# Patient Record
Sex: Female | Born: 1985
Health system: Southern US, Community
[De-identification: ages and names within clinical notes are randomized; demographics above are authoritative.]

## PROBLEM LIST (undated history)

## (undated) ENCOUNTER — Inpatient Hospital Stay (HOSPITAL_COMMUNITY): Payer: Self-pay

## (undated) DIAGNOSIS — O24419 Gestational diabetes mellitus in pregnancy, unspecified control: Secondary | ICD-10-CM

## (undated) DIAGNOSIS — F1910 Other psychoactive substance abuse, uncomplicated: Secondary | ICD-10-CM

## (undated) DIAGNOSIS — E119 Type 2 diabetes mellitus without complications: Secondary | ICD-10-CM

## (undated) DIAGNOSIS — E669 Obesity, unspecified: Secondary | ICD-10-CM

## (undated) DIAGNOSIS — S83512A Sprain of anterior cruciate ligament of left knee, initial encounter: Secondary | ICD-10-CM

## (undated) DIAGNOSIS — S83249A Other tear of medial meniscus, current injury, unspecified knee, initial encounter: Secondary | ICD-10-CM

## (undated) HISTORY — PX: EXCISION BONE CYST: SHX6616

---
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
## 2007-01-29 ENCOUNTER — Telehealth: Payer: Self-pay | Admitting: *Deleted

## 2007-01-30 ENCOUNTER — Encounter (INDEPENDENT_AMBULATORY_CARE_PROVIDER_SITE_OTHER): Payer: Self-pay | Admitting: Family Medicine

## 2007-01-30 ENCOUNTER — Ambulatory Visit: Payer: Self-pay | Admitting: Family Medicine

## 2007-01-30 LAB — CONVERTED CEMR LAB
Beta hcg, urine, semiquantitative: NEGATIVE
Chlamydia, DNA Probe: NEGATIVE
GC Probe Amp, Genital: NEGATIVE
Whiff Test: NEGATIVE

## 2007-01-31 ENCOUNTER — Encounter (INDEPENDENT_AMBULATORY_CARE_PROVIDER_SITE_OTHER): Payer: Self-pay | Admitting: Family Medicine

## 2007-03-18 ENCOUNTER — Encounter: Payer: Self-pay | Admitting: Family Medicine

## 2007-03-18 ENCOUNTER — Ambulatory Visit: Payer: Self-pay | Admitting: Family Medicine

## 2007-03-18 ENCOUNTER — Emergency Department (HOSPITAL_COMMUNITY): Admission: EM | Admit: 2007-03-18 | Discharge: 2007-03-18 | Payer: Self-pay | Admitting: Emergency Medicine

## 2007-03-18 LAB — CONVERTED CEMR LAB
ALT: 13 units/L (ref 0–35)
AST: 11 units/L (ref 0–37)
Albumin: 3.9 g/dL (ref 3.5–5.2)
Alkaline Phosphatase: 69 units/L (ref 39–117)
Amylase: 33 units/L (ref 0–105)
BUN: 11 mg/dL (ref 6–23)
CO2: 26 meq/L (ref 19–32)
Calcium: 8.9 mg/dL (ref 8.4–10.5)
Chlamydia, DNA Probe: NEGATIVE
Chloride: 104 meq/L (ref 96–112)
Creatinine, Ser: 0.58 mg/dL (ref 0.40–1.20)
GC Probe Amp, Genital: NEGATIVE
Glucose, Bld: 104 mg/dL — ABNORMAL HIGH (ref 70–99)
Lipase: 8 units/L (ref 0–75)
Potassium: 4.2 meq/L (ref 3.5–5.3)
Sodium: 140 meq/L (ref 135–145)
Total Bilirubin: 0.4 mg/dL (ref 0.3–1.2)
Total Protein: 6.7 g/dL (ref 6.0–8.3)

## 2007-03-19 ENCOUNTER — Encounter: Payer: Self-pay | Admitting: Family Medicine

## 2007-03-19 ENCOUNTER — Telehealth: Payer: Self-pay | Admitting: *Deleted

## 2007-03-20 ENCOUNTER — Telehealth: Payer: Self-pay | Admitting: *Deleted

## 2007-03-21 ENCOUNTER — Ambulatory Visit (HOSPITAL_COMMUNITY): Admission: RE | Admit: 2007-03-21 | Discharge: 2007-03-21 | Payer: Self-pay | Admitting: Family Medicine

## 2007-03-31 ENCOUNTER — Encounter: Payer: Self-pay | Admitting: Family Medicine

## 2007-04-15 ENCOUNTER — Ambulatory Visit: Payer: Self-pay | Admitting: Family Medicine

## 2007-04-15 ENCOUNTER — Telehealth (INDEPENDENT_AMBULATORY_CARE_PROVIDER_SITE_OTHER): Payer: Self-pay | Admitting: *Deleted

## 2007-04-15 LAB — CONVERTED CEMR LAB: Rapid Strep: NEGATIVE

## 2007-05-08 ENCOUNTER — Other Ambulatory Visit: Admission: RE | Admit: 2007-05-08 | Discharge: 2007-05-08 | Payer: Self-pay | Admitting: Family Medicine

## 2007-05-08 ENCOUNTER — Telehealth: Payer: Self-pay | Admitting: *Deleted

## 2007-05-08 ENCOUNTER — Encounter (INDEPENDENT_AMBULATORY_CARE_PROVIDER_SITE_OTHER): Payer: Self-pay | Admitting: Family Medicine

## 2007-05-08 ENCOUNTER — Ambulatory Visit: Payer: Self-pay | Admitting: Family Medicine

## 2007-05-08 DIAGNOSIS — F329 Major depressive disorder, single episode, unspecified: Secondary | ICD-10-CM

## 2007-05-08 DIAGNOSIS — F32A Depression, unspecified: Secondary | ICD-10-CM | POA: Insufficient documentation

## 2007-05-08 LAB — CONVERTED CEMR LAB
Beta hcg, urine, semiquantitative: NEGATIVE
Chlamydia, DNA Probe: NEGATIVE
GC Probe Amp, Genital: NEGATIVE
Hgb A1c MFr Bld: 5.8 %
KOH Prep: NEGATIVE
Pap Smear: NORMAL
Whiff Test: NEGATIVE

## 2007-05-09 ENCOUNTER — Encounter: Admission: RE | Admit: 2007-05-09 | Discharge: 2007-05-09 | Payer: Self-pay | Admitting: Sports Medicine

## 2007-05-13 ENCOUNTER — Telehealth (INDEPENDENT_AMBULATORY_CARE_PROVIDER_SITE_OTHER): Payer: Self-pay | Admitting: Family Medicine

## 2007-05-13 ENCOUNTER — Encounter (INDEPENDENT_AMBULATORY_CARE_PROVIDER_SITE_OTHER): Payer: Self-pay | Admitting: Family Medicine

## 2007-08-23 ENCOUNTER — Telehealth: Payer: Self-pay | Admitting: *Deleted

## 2007-08-26 ENCOUNTER — Ambulatory Visit: Payer: Self-pay | Admitting: Sports Medicine

## 2007-08-26 DIAGNOSIS — L2089 Other atopic dermatitis: Secondary | ICD-10-CM

## 2007-10-01 ENCOUNTER — Encounter (INDEPENDENT_AMBULATORY_CARE_PROVIDER_SITE_OTHER): Payer: Self-pay | Admitting: Family Medicine

## 2007-10-01 ENCOUNTER — Ambulatory Visit: Payer: Self-pay | Admitting: Family Medicine

## 2007-10-01 LAB — CONVERTED CEMR LAB
Beta hcg, urine, semiquantitative: NEGATIVE
Chlamydia, DNA Probe: NEGATIVE
GC Probe Amp, Genital: NEGATIVE
Hgb A1c MFr Bld: 6.5 %
Whiff Test: NEGATIVE

## 2008-03-09 LAB — CONVERTED CEMR LAB

## 2008-03-11 ENCOUNTER — Encounter: Payer: Self-pay | Admitting: Family Medicine

## 2008-03-11 ENCOUNTER — Other Ambulatory Visit: Admission: RE | Admit: 2008-03-11 | Discharge: 2008-03-11 | Payer: Self-pay | Admitting: Family Medicine

## 2008-03-11 ENCOUNTER — Ambulatory Visit: Payer: Self-pay | Admitting: Family Medicine

## 2008-03-11 LAB — CONVERTED CEMR LAB
Beta hcg, urine, semiquantitative: NEGATIVE
Chlamydia, DNA Probe: NEGATIVE
GC Probe Amp, Genital: NEGATIVE
Hgb A1c MFr Bld: 6.3 %
Whiff Test: POSITIVE

## 2008-03-18 ENCOUNTER — Encounter: Payer: Self-pay | Admitting: Family Medicine

## 2008-04-14 ENCOUNTER — Ambulatory Visit: Payer: Self-pay | Admitting: Family Medicine

## 2008-05-11 ENCOUNTER — Ambulatory Visit: Payer: Self-pay | Admitting: Family Medicine

## 2008-06-10 ENCOUNTER — Encounter: Payer: Self-pay | Admitting: *Deleted

## 2008-08-14 ENCOUNTER — Encounter: Payer: Self-pay | Admitting: Family Medicine

## 2008-08-14 ENCOUNTER — Telehealth: Payer: Self-pay | Admitting: Psychology

## 2008-08-14 ENCOUNTER — Ambulatory Visit: Payer: Self-pay | Admitting: Family Medicine

## 2008-08-14 LAB — CONVERTED CEMR LAB
Hgb A1c MFr Bld: 6.8 %
Whiff Test: POSITIVE

## 2008-08-16 ENCOUNTER — Telehealth: Payer: Self-pay | Admitting: Family Medicine

## 2008-08-18 ENCOUNTER — Telehealth: Payer: Self-pay | Admitting: *Deleted

## 2008-08-20 LAB — CONVERTED CEMR LAB
Chlamydia, DNA Probe: NEGATIVE
GC Probe Amp, Genital: NEGATIVE

## 2008-09-09 ENCOUNTER — Telehealth: Payer: Self-pay | Admitting: Family Medicine

## 2008-09-16 ENCOUNTER — Ambulatory Visit: Payer: Self-pay | Admitting: Family Medicine

## 2008-09-16 DIAGNOSIS — F39 Unspecified mood [affective] disorder: Secondary | ICD-10-CM | POA: Insufficient documentation

## 2008-09-28 ENCOUNTER — Ambulatory Visit: Payer: Self-pay | Admitting: Family Medicine

## 2008-10-05 ENCOUNTER — Ambulatory Visit: Payer: Self-pay | Admitting: Family Medicine

## 2008-10-14 ENCOUNTER — Telehealth: Payer: Self-pay | Admitting: Psychology

## 2008-11-13 ENCOUNTER — Emergency Department (HOSPITAL_COMMUNITY): Admission: EM | Admit: 2008-11-13 | Discharge: 2008-11-13 | Payer: Self-pay | Admitting: Emergency Medicine

## 2008-11-20 ENCOUNTER — Ambulatory Visit: Payer: Self-pay | Admitting: Family Medicine

## 2008-11-20 LAB — CONVERTED CEMR LAB
Beta hcg, urine, semiquantitative: NEGATIVE
Hgb A1c MFr Bld: 6.7 %

## 2009-01-20 ENCOUNTER — Ambulatory Visit: Payer: Self-pay | Admitting: Family Medicine

## 2009-01-20 LAB — CONVERTED CEMR LAB: Beta hcg, urine, semiquantitative: NEGATIVE

## 2009-01-28 ENCOUNTER — Ambulatory Visit: Payer: Self-pay | Admitting: Family Medicine

## 2009-02-04 ENCOUNTER — Emergency Department (HOSPITAL_COMMUNITY): Admission: EM | Admit: 2009-02-04 | Discharge: 2009-02-04 | Payer: Self-pay | Admitting: Emergency Medicine

## 2009-02-16 ENCOUNTER — Encounter: Payer: Self-pay | Admitting: Family Medicine

## 2009-03-17 ENCOUNTER — Ambulatory Visit: Payer: Self-pay | Admitting: Family Medicine

## 2009-03-17 DIAGNOSIS — L732 Hidradenitis suppurativa: Secondary | ICD-10-CM

## 2009-03-22 ENCOUNTER — Other Ambulatory Visit: Admission: RE | Admit: 2009-03-22 | Discharge: 2009-03-22 | Payer: Self-pay | Admitting: Family Medicine

## 2009-03-22 ENCOUNTER — Ambulatory Visit: Payer: Self-pay | Admitting: Family Medicine

## 2009-03-22 ENCOUNTER — Encounter: Payer: Self-pay | Admitting: Family Medicine

## 2009-03-22 ENCOUNTER — Telehealth: Payer: Self-pay | Admitting: Family Medicine

## 2009-03-23 ENCOUNTER — Encounter: Payer: Self-pay | Admitting: Family Medicine

## 2009-03-23 ENCOUNTER — Encounter: Admission: RE | Admit: 2009-03-23 | Discharge: 2009-03-23 | Payer: Self-pay | Admitting: Family Medicine

## 2009-03-31 ENCOUNTER — Ambulatory Visit: Payer: Self-pay | Admitting: Family Medicine

## 2009-04-06 ENCOUNTER — Ambulatory Visit: Payer: Self-pay | Admitting: Family Medicine

## 2009-04-06 ENCOUNTER — Encounter: Payer: Self-pay | Admitting: Family Medicine

## 2009-04-06 DIAGNOSIS — R8789 Other abnormal findings in specimens from female genital organs: Secondary | ICD-10-CM | POA: Insufficient documentation

## 2009-04-14 ENCOUNTER — Encounter: Payer: Self-pay | Admitting: Family Medicine

## 2009-05-20 ENCOUNTER — Ambulatory Visit: Payer: Self-pay | Admitting: Family Medicine

## 2009-05-20 LAB — CONVERTED CEMR LAB: Beta hcg, urine, semiquantitative: NEGATIVE

## 2009-06-17 ENCOUNTER — Telehealth: Payer: Self-pay | Admitting: Family Medicine

## 2009-10-11 ENCOUNTER — Ambulatory Visit: Payer: Self-pay | Admitting: Family Medicine

## 2009-10-11 LAB — CONVERTED CEMR LAB: Hgb A1c MFr Bld: 8.4 %

## 2009-10-25 ENCOUNTER — Encounter: Payer: Self-pay | Admitting: Family Medicine

## 2009-10-29 ENCOUNTER — Ambulatory Visit: Payer: Self-pay | Admitting: Family Medicine

## 2009-10-29 LAB — CONVERTED CEMR LAB: Beta hcg, urine, semiquantitative: NEGATIVE

## 2009-11-15 ENCOUNTER — Ambulatory Visit: Payer: Self-pay | Admitting: Family Medicine

## 2009-11-15 ENCOUNTER — Encounter: Payer: Self-pay | Admitting: Family Medicine

## 2009-11-15 LAB — CONVERTED CEMR LAB
Beta hcg, urine, semiquantitative: POSITIVE
Bilirubin Urine: NEGATIVE
Blood in Urine, dipstick: NEGATIVE
GC Probe Amp, Genital: NEGATIVE
Glucose, Urine, Semiquant: 100
Nitrite: NEGATIVE
Protein, U semiquant: NEGATIVE
Specific Gravity, Urine: 1.02
Urobilinogen, UA: 0.2
Whiff Test: POSITIVE
pH: 6.5

## 2009-11-16 ENCOUNTER — Ambulatory Visit (HOSPITAL_COMMUNITY): Admission: RE | Admit: 2009-11-16 | Discharge: 2009-11-16 | Payer: Self-pay | Admitting: Family Medicine

## 2009-11-16 ENCOUNTER — Encounter: Payer: Self-pay | Admitting: Family Medicine

## 2009-11-16 ENCOUNTER — Telehealth: Payer: Self-pay | Admitting: Family Medicine

## 2009-11-17 ENCOUNTER — Encounter: Payer: Self-pay | Admitting: Family Medicine

## 2009-11-17 ENCOUNTER — Telehealth: Payer: Self-pay | Admitting: Family Medicine

## 2009-11-17 ENCOUNTER — Telehealth: Payer: Self-pay | Admitting: *Deleted

## 2009-11-18 ENCOUNTER — Encounter: Payer: Self-pay | Admitting: Family Medicine

## 2009-11-23 ENCOUNTER — Telehealth: Payer: Self-pay | Admitting: Family Medicine

## 2009-11-23 ENCOUNTER — Ambulatory Visit: Payer: Self-pay | Admitting: Family Medicine

## 2009-11-23 ENCOUNTER — Encounter: Payer: Self-pay | Admitting: Family Medicine

## 2009-11-23 LAB — CONVERTED CEMR LAB
Eosinophils Absolute: 0.1 10*3/uL (ref 0.0–0.7)
Hepatitis B Surface Ag: NEGATIVE
Lymphocytes Relative: 21 % (ref 12–46)
Lymphs Abs: 1.7 10*3/uL (ref 0.7–4.0)
MCV: 86.7 fL (ref 78.0–100.0)
Neutro Abs: 5.7 10*3/uL (ref 1.7–7.7)
Neutrophils Relative %: 71 % (ref 43–77)
Platelets: 293 10*3/uL (ref 150–400)
Rubella: 1 intl units/mL
Sickle Cell Screen: NEGATIVE
WBC: 8 10*3/uL (ref 4.0–10.5)

## 2009-11-24 ENCOUNTER — Encounter: Payer: Self-pay | Admitting: Family Medicine

## 2009-11-29 ENCOUNTER — Encounter: Payer: Self-pay | Admitting: Family Medicine

## 2009-11-29 ENCOUNTER — Ambulatory Visit (HOSPITAL_COMMUNITY): Admission: RE | Admit: 2009-11-29 | Discharge: 2009-11-29 | Payer: Self-pay | Admitting: Family Medicine

## 2009-11-30 ENCOUNTER — Telehealth: Payer: Self-pay | Admitting: Family Medicine

## 2009-12-01 ENCOUNTER — Ambulatory Visit: Payer: Self-pay | Admitting: Family Medicine

## 2009-12-01 LAB — CONVERTED CEMR LAB: Whiff Test: NEGATIVE

## 2009-12-24 ENCOUNTER — Inpatient Hospital Stay (HOSPITAL_COMMUNITY): Admission: AD | Admit: 2009-12-24 | Discharge: 2009-12-24 | Payer: Self-pay | Admitting: Obstetrics & Gynecology

## 2010-01-13 ENCOUNTER — Encounter: Payer: Self-pay | Admitting: Family Medicine

## 2010-01-19 ENCOUNTER — Ambulatory Visit: Payer: Self-pay | Admitting: Obstetrics and Gynecology

## 2010-01-27 ENCOUNTER — Ambulatory Visit: Payer: Self-pay | Admitting: Obstetrics & Gynecology

## 2010-01-31 ENCOUNTER — Ambulatory Visit: Payer: Self-pay | Admitting: Obstetrics & Gynecology

## 2010-01-31 ENCOUNTER — Encounter: Payer: Self-pay | Admitting: Family Medicine

## 2010-01-31 ENCOUNTER — Encounter: Admission: RE | Admit: 2010-01-31 | Discharge: 2010-01-31 | Payer: Self-pay | Admitting: Obstetrics and Gynecology

## 2010-01-31 LAB — CONVERTED CEMR LAB
ALT: 21 U/L
AST: 15 U/L
Albumin: 3.5 g/dL
Alkaline Phosphatase: 61 U/L
BUN: 8 mg/dL
CO2: 22 meq/L
Calcium: 8.7 mg/dL
Chloride: 102 meq/L
Collection Interval-CRCL: 24 hr
Creatinine 24 HR UR: 1548 mg/24hr (ref 700–1800)
Creatinine Clearance: 244 mL/min — ABNORMAL HIGH (ref 75–115)
Creatinine, Ser: 0.44 mg/dL
Creatinine, Urine: 96.7 mg/dL
Glucose, Bld: 103 mg/dL — ABNORMAL HIGH
HCT: 37.9 %
Hemoglobin: 12.6 g/dL
Hgb A1c MFr Bld: 5.9 % — ABNORMAL HIGH
MCHC: 33.2 g/dL
MCV: 85.7 fL
Platelets: 266 10*3/uL
Potassium: 4 meq/L
RBC: 4.42 M/uL
RDW: 13.8 %
Sodium: 135 meq/L
TSH: 1.248 u[IU]/mL
Total Bilirubin: 0.4 mg/dL
Total Protein: 6.4 g/dL
Uric Acid, Serum: 5.1 mg/dL
WBC: 8.8 10*3/uL

## 2010-02-10 ENCOUNTER — Ambulatory Visit: Payer: Self-pay | Admitting: Family Medicine

## 2010-02-14 ENCOUNTER — Ambulatory Visit (HOSPITAL_COMMUNITY): Admission: RE | Admit: 2010-02-14 | Discharge: 2010-02-14 | Payer: Self-pay | Admitting: Family Medicine

## 2010-02-14 ENCOUNTER — Ambulatory Visit: Payer: Self-pay | Admitting: Obstetrics & Gynecology

## 2010-02-17 ENCOUNTER — Encounter: Payer: Self-pay | Admitting: Family Medicine

## 2010-02-21 ENCOUNTER — Ambulatory Visit: Payer: Self-pay | Admitting: Obstetrics and Gynecology

## 2010-02-28 ENCOUNTER — Ambulatory Visit: Payer: Self-pay | Admitting: Obstetrics & Gynecology

## 2010-03-07 ENCOUNTER — Ambulatory Visit: Payer: Self-pay | Admitting: Obstetrics & Gynecology

## 2010-03-11 ENCOUNTER — Encounter: Payer: Self-pay | Admitting: Family Medicine

## 2010-03-14 ENCOUNTER — Ambulatory Visit: Payer: Self-pay | Admitting: Obstetrics & Gynecology

## 2010-03-21 ENCOUNTER — Ambulatory Visit: Payer: Self-pay | Admitting: Obstetrics & Gynecology

## 2010-04-04 ENCOUNTER — Ambulatory Visit: Payer: Self-pay | Admitting: Obstetrics & Gynecology

## 2010-04-04 ENCOUNTER — Encounter: Admission: RE | Admit: 2010-04-04 | Discharge: 2010-05-06 | Payer: Self-pay | Admitting: Family Medicine

## 2010-04-18 ENCOUNTER — Encounter: Payer: Self-pay | Admitting: Family Medicine

## 2010-04-18 ENCOUNTER — Ambulatory Visit: Payer: Self-pay | Admitting: Obstetrics & Gynecology

## 2010-04-18 LAB — CONVERTED CEMR LAB
MCHC: 32.4 g/dL (ref 30.0–36.0)
MCV: 88.4 fL (ref 78.0–100.0)
Platelets: 281 10*3/uL (ref 150–400)
RDW: 13.6 % (ref 11.5–15.5)

## 2010-04-21 ENCOUNTER — Ambulatory Visit (HOSPITAL_COMMUNITY): Admission: RE | Admit: 2010-04-21 | Discharge: 2010-04-21 | Payer: Self-pay | Admitting: Family Medicine

## 2010-04-25 ENCOUNTER — Ambulatory Visit: Payer: Self-pay | Admitting: Obstetrics & Gynecology

## 2010-05-02 ENCOUNTER — Ambulatory Visit: Payer: Self-pay | Admitting: Obstetrics & Gynecology

## 2010-05-09 ENCOUNTER — Ambulatory Visit (HOSPITAL_COMMUNITY): Admission: RE | Admit: 2010-05-09 | Discharge: 2010-05-09 | Payer: Self-pay | Admitting: Family Medicine

## 2010-05-09 ENCOUNTER — Ambulatory Visit: Payer: Self-pay | Admitting: Obstetrics & Gynecology

## 2010-05-16 ENCOUNTER — Ambulatory Visit: Payer: Self-pay | Admitting: Obstetrics and Gynecology

## 2010-05-23 ENCOUNTER — Encounter
Admission: RE | Admit: 2010-05-23 | Discharge: 2010-06-27 | Payer: Self-pay | Source: Home / Self Care | Admitting: Obstetrics and Gynecology

## 2010-05-23 ENCOUNTER — Ambulatory Visit: Payer: Self-pay | Admitting: Obstetrics & Gynecology

## 2010-05-26 ENCOUNTER — Ambulatory Visit: Payer: Self-pay | Admitting: Obstetrics and Gynecology

## 2010-05-30 ENCOUNTER — Ambulatory Visit: Payer: Self-pay | Admitting: Obstetrics & Gynecology

## 2010-06-02 ENCOUNTER — Ambulatory Visit: Payer: Self-pay | Admitting: Obstetrics and Gynecology

## 2010-06-06 ENCOUNTER — Ambulatory Visit: Payer: Self-pay | Admitting: Family Medicine

## 2010-06-09 ENCOUNTER — Ambulatory Visit (HOSPITAL_COMMUNITY): Admission: RE | Admit: 2010-06-09 | Discharge: 2010-06-09 | Payer: Self-pay | Admitting: Obstetrics & Gynecology

## 2010-06-09 ENCOUNTER — Ambulatory Visit: Payer: Self-pay | Admitting: Obstetrics and Gynecology

## 2010-06-13 ENCOUNTER — Ambulatory Visit: Payer: Self-pay | Admitting: Obstetrics & Gynecology

## 2010-06-16 ENCOUNTER — Ambulatory Visit: Payer: Self-pay | Admitting: Obstetrics and Gynecology

## 2010-06-17 ENCOUNTER — Encounter: Payer: Self-pay | Admitting: Family Medicine

## 2010-06-20 ENCOUNTER — Ambulatory Visit: Payer: Self-pay | Admitting: Obstetrics and Gynecology

## 2010-06-20 ENCOUNTER — Encounter: Payer: Self-pay | Admitting: Family Medicine

## 2010-06-21 ENCOUNTER — Encounter: Payer: Self-pay | Admitting: Family Medicine

## 2010-06-23 ENCOUNTER — Ambulatory Visit (HOSPITAL_COMMUNITY): Admission: RE | Admit: 2010-06-23 | Discharge: 2010-06-23 | Payer: Self-pay | Admitting: Family Medicine

## 2010-06-23 ENCOUNTER — Ambulatory Visit: Payer: Self-pay | Admitting: Obstetrics & Gynecology

## 2010-06-25 ENCOUNTER — Inpatient Hospital Stay (HOSPITAL_COMMUNITY): Admission: AD | Admit: 2010-06-25 | Discharge: 2010-06-25 | Payer: Self-pay | Admitting: Obstetrics & Gynecology

## 2010-06-27 ENCOUNTER — Ambulatory Visit: Payer: Self-pay | Admitting: Obstetrics and Gynecology

## 2010-06-29 ENCOUNTER — Ambulatory Visit: Payer: Self-pay | Admitting: Obstetrics and Gynecology

## 2010-07-03 ENCOUNTER — Inpatient Hospital Stay (HOSPITAL_COMMUNITY)
Admission: AD | Admit: 2010-07-03 | Discharge: 2010-07-03 | Payer: Self-pay | Source: Home / Self Care | Admitting: Obstetrics & Gynecology

## 2010-07-04 ENCOUNTER — Ambulatory Visit: Payer: Self-pay | Admitting: Obstetrics & Gynecology

## 2010-07-04 ENCOUNTER — Ambulatory Visit (HOSPITAL_COMMUNITY)
Admission: RE | Admit: 2010-07-04 | Discharge: 2010-07-04 | Payer: Self-pay | Source: Home / Self Care | Admitting: Family Medicine

## 2010-07-07 ENCOUNTER — Ambulatory Visit: Payer: Self-pay | Admitting: Family Medicine

## 2010-07-10 ENCOUNTER — Inpatient Hospital Stay (HOSPITAL_COMMUNITY)
Admission: AD | Admit: 2010-07-10 | Discharge: 2010-07-13 | Payer: Self-pay | Source: Home / Self Care | Attending: Obstetrics & Gynecology | Admitting: Obstetrics & Gynecology

## 2010-08-08 ENCOUNTER — Ambulatory Visit: Payer: Self-pay | Admitting: Family Medicine

## 2010-08-17 ENCOUNTER — Ambulatory Visit: Admission: RE | Admit: 2010-08-17 | Discharge: 2010-08-17 | Payer: Self-pay | Source: Home / Self Care

## 2010-08-17 LAB — CONVERTED CEMR LAB: Hgb A1c MFr Bld: 5.5 %

## 2010-09-06 NOTE — Letter (Signed)
Summary: Generic Letter  [facility]  [address]   [place], [place] [address]   Phone: [phone]  Fax: [phone]    11/16/2009  [facility] SCHEFFER [address] [place], [place]  [address]  Dear [name],  I just wanted to talk with you about your ultrasound results.  I tried to call you, but was unable to reach you by phone.  Your ultrasound showed that you are a little more than [redacted] weeks pregnant.  They were not able to actually see the embryo (baby), but that is normal at this early stage of the pregnancy.  Be sure to keep your appointment with the [facility].  Please call me if you have any questions or concerns.             Sincerely,   [name] MD  Appended Document: Generic Letter mailed.

## 2010-09-06 NOTE — Progress Notes (Signed)
Summary: attempt to notify about ultrasound results  Phone Note Outgoing Call   Call placed by: [name] MD,  November 16, 2009 1:41 PM Summary of Call: attempted to call pt (no answer/no vm)  to discuss ultrasound results.  no fetal pole seen, but normal given the early stage of pregnancy.  Will send letter.   Initial call taken by: [name] MD,  November 16, 2009 1:42 PM

## 2010-09-06 NOTE — Miscellaneous (Signed)
Summary: Re: High Risk clinic appointment  Clinical Lists Changes   called patient to advise her of appointment that has been scheduled at [facility] for 11/24/2009 at 8:45 AM. patient states she is very confused because she was called yesterday and was told that we will be seeing her here instead. will send message to Dr. Rexene Alberts ( has appointment with him 11/23/2009 )  and also Dr. Lafonda Mosses since she put in the referral  to please advise. Theresia Lo RN  November 18, 2009 8:59 AM  Unless Aurther Loft has an objection, I would like her to go straight to high risk.  I wonder if she misunderstood the conversations yesterday--I think they were all about medicines.  Dr.Hubbert Landrigan please advise about appointment with you on 11/23/2009.  Theresia Lo RN  November 18, 2009 9:21 AM  discussed with Dr. Lafonda Mosses. [facility] prefers that we at least get the patient started and plan on referring to them if/when needed. Please call pt to clarify and inform that indeed she should come here for initial OB appointment with me. Sorry for the confusion. Myrtie Soman  MD  November 18, 2009 10:42 AM    the appointment that has been scheduled with [facility] is 11/24/2009. should I cancel this or wait until she comes in on 11/23/2009 to decide. Theresia Lo RN  November 18, 2009 11:03 AM  appointment has been cancel.Tonia Brooms is to keep her appointment with me. Myrtie Soman  MD  November 18, 2009 9:37 PM

## 2010-09-06 NOTE — Letter (Signed)
Summary: Generic Letter  [facility]  [address]   [place], [place] [address]   Phone: [phone]  Fax: [phone]    11/17/2009  [facility] SCHEFFER [address] [place], [place]  [address]  Dear [name],    It looks like you have a urinary tract infection.  You need to be on antibiotics.  Please call the office when you get this letter so that we can get you a prescription.  I tried to reach you by phone, but was not able to get in touch with you.       Sincerely,   [name] MD  Appended Document: Generic Letter mailed

## 2010-09-06 NOTE — Miscellaneous (Signed)
  Clinical Lists Changes  Problems: Removed problem of AMENORRHEA (ICD-626.0) Removed problem of ACROCHORDON (ICD-701.9) Removed problem of ELEVATED BLOOD PRESSURE WITHOUT DIAGNOSIS OF HYPERTENSION (ICD-796.2) Removed problem of Question of  POLYCYSTIC OVARIAN DISEASE (ICD-256.4)

## 2010-09-06 NOTE — Assessment & Plan Note (Signed)
Summary: pt summary     Impression & Recommendations:  Problem # 1:  DIABETES MELLITUS II, UNCOMPLICATED (ICD-250.00) Non-adherent at times. A1c reflects med adherence. Recently restarted on metformin. Now seen at [facility] for her [facility].   Her updated medication list for this problem includes:    Metformin Hcl 1000 Mg Tabs (Metformin hcl) ..... One by mouth two times a day  Labs Reviewed: Creat: 0.58 (03/18/2007)     Last Eye Exam: normal (10/19/2009) Reviewed HgBA1c results: 8.4 (10/11/2009)  6.7 (11/20/2008)  Problem # 2:  OBESITY, NOS (ICD-278.00) very poor diet but recently has made some strides. Lost  ~ 20 lbs since 8/10 before recently becoming pregnant.  Problem # 3:  TOBACCO DEPENDENCE (ICD-305.1) low volume smoker. Has had trouble quitting.  Problem # 4:  ? of POLYCYSTIC OVARIAN DISEASE (ICD-256.4) history of cysts on [place] 2008, DUB, obesity, menstrual irregularities. On metformin.   Complete Medication List: 1)  Metformin Hcl 1000 Mg Tabs (Metformin hcl) .... One by mouth two times a day 2)  Blood Glucose Monitor Kit (Blood glucose monitoring suppl) .... Check sugars once a day 3)  Blood Glucose Test Strp (Glucose blood) .... Check sugars once a day 4)  Lancets Misc (Lancets) .... For checking sugars once a day 5)  Prenatal Vitamins 0.8 Mg Tabs (Prenatal multivit-min-fe-fa) .Marland Kitchen.. 1 tab by mouth daily; dispense generic   Past History:  Past Medical History:  childhood Varicella, f. glu= 130 TSH= 1.347  3/06, FT NSVD, no prenatal care, Ht=64.5 inches, menarche 25 yo  HgbA1C 6.5 2/09; 6.8 1/10  likely PCOS: had small cysts on [place] 2008, DUB, obesity, glucose intolerance

## 2010-09-06 NOTE — Progress Notes (Signed)
Summary: phn msg  Phone Note Call from Patient Call back at Home Phone [phone]   Caller: Patient Summary of Call: read letter to pt about UTI and pt is wondering if she should continue meds for her bacterial inf??  pls call before 4pm [facility] Initial call taken by: De Nurse,  November 17, 2009 2:16 PM  Follow-up for Phone Call        uses the heath department. told her to take the meds. changed to the health dept per pt request. states her boyfriend proposes last Saturday & found out she was pregnant monday. has applied for medicaid Follow-up by: Golden Circle RN,  November 17, 2009 2:21 PM    Prescriptions: CEPHALEXIN 500 MG CAPS (CEPHALEXIN) 1 tab by mouth two times a day for 7 days for urinary tract infection  #14 x 0   Entered by:   Golden Circle RN   Authorized by:   Marland Kitchen Triage [facility]   Signed by:   Golden Circle RN on 11/17/2009   Method used:   Printed then faxed to ...       [facility] Department (retail)       [address] [place], [place]  [address]       Ph: [phone]       Fax: [phone]   RxID:   [id number]

## 2010-09-06 NOTE — Progress Notes (Signed)
Summary: triage  Phone Note Call from Patient Call back at Home Phone [phone]   Caller: Patient Summary of Call: has a yeast inf and wants to know what do for it since she is [redacted] weeks pregnant Initial call taken by: De Nurse,  November 30, 2009 10:42 AM  Follow-up for Phone Call        advised seeing md. she has no ride today. took 8:30am workin tomorrow Follow-up by: Golden Circle RN,  November 30, 2009 10:48 AM

## 2010-09-06 NOTE — Assessment & Plan Note (Signed)
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  History of Present Illness: 1. 25 yo with T2DM who found out she was pregnant 4/11 here for NOB visit. Had U/S done 4/12 which showed that she was 5 w 2 d EGA. Could not see fetal pole due to early dates, needs to go back  ~ 2 weeks after initial [place].  States that sugars have been very well controlled since she was found to be pregnant.   2. Diabetes taking medications: yes problems with medications?: no blood sugar testing frequency: two times a day  hypoglycemic events?: no subjective: doing much better with sugars.   ROS chest pain: no   shortness of breath: no   polyuria:   yes  increased thirst: yes    problems with Breast tenderness: yes       more emotional: yes  Habits & Providers  Alcohol-Tobacco-Diet     Tobacco Status: current     Tobacco Counseling: to quit use of tobacco products     Cigarette Packs/Day: <0.25  Current Medications (verified): 1)  Metformin Hcl 1000 Mg Tabs (Metformin Hcl) .... One By Mouth Two Times A Day 2)  Blood Glucose Monitor  Kit (Blood Glucose Monitoring Suppl) .... Check Sugars Once A Day 3)  Blood Glucose Test  Strp (Glucose Blood) .... Check Sugars Once A Day 4)  Lancets  Misc (Lancets) .... For Checking Sugars Once A Day 5)  Prenatal Vitamins 0.8 Mg Tabs (Prenatal Multivit-Min-Fe-Fa)  .Marland Kitchen.. 1 Tab By Mouth Daily; Dispense Generic 6)  Cephalexin 500 Mg Caps (Cephalexin) .Marland Kitchen.. 1 Tab By Mouth Two Times A Day For 7 Days For Urinary Tract Infection  Allergies (verified): No Known Drug Allergies  Family History: Reviewed history from 03/17/2009 and no changes required. migraine ha- aunt and uncle, remote h/o breast ca, HTN, DM, CAD in gparents, uncle.  Grandfather d. colon ca. mom- HTN  aunt-depression Bipoloar - uncle and cousin [place] aunt with breast cancer - 28 recently diagnosed with breast cancer  Social History: Smoked 1 pack per day since 2006 - stopped 2/10 -- now smoking again.  Occ. EtoH usually less than once a month. No other illicit drugs.  High risk sexual behavior. Close to mom. Involved with BF Miguel.  Works at [employer] but would like to go to school to do something different, will start soon.Education:  10th Hepatitis Risk:  no  Review of Systems       review of systems as noted in HPI section   Physical Exam  General:  vitals signs reviewed -- mildly hypertensive but otherwise normal  Lungs:  work of breathing unlabored, clear to auscultation bilaterally; no wheezes, rales, or ronchi; good air movement throughout  Heart:  regular rate and rhythm, no murmurs; normal s1/s2  Abdomen:  +BS, obese, soft, non-tender, non-distended; no masses; no rebound or guarding  Genitalia:  deferred (recent exam 4/11) Extremities:  trace BLE edema Skin:  Intact without suspicious lesions or rashes Psych:  Cognition and judgment appear intact. Alert and cooperative with normal attention span and concentration. No apparent delusions, illusions, hallucinations   Impression & Recommendations:  Problem # 1:  PREGNANCY, HIGH RISK (ICD-V23.9) Assessment Unchanged 25 yo G1P0000 with type 2 DM on metformin at 6w 2d per 5 week U/S. Doing well currently. Counsled to stop smoking (pt states she will) and to continue metformin. Counseled about morning sickness, diet and exercise  as well. Gave pt a list of meds safe to take in pregnancy. Continue PNV. Will also need to be counseled on higher risk of congenital malformation associated with pregestational diabetes.   pap 03/17/09 LGSIL --> colpo 04/06/09 normal GC/chlamydia negative UCx + for klebsiella, currently finishing course of keflex  prenatal labs today will need test of cure for UTI  refer to high-risk today for preexisting diabetes and BP monitoring.  pt would like to proceed with integrated screening but will defer this to [facility].  Orders: Prenatal-[facility] ([address]) Medicaid OB visit - [facility] ([address]) Prenatal U/S < 14 weeks - [address]  (Prenatal U/S) Obstetric Referral (Obstetric)  Problem # 2:  DIABETES MELLITUS II, UNCOMPLICATED (ICD-250.00) Assessment: Unchanged Pt doing better with metformin, diet and checking sugars since finding out she was pregnant. Continue metformin.  Her updated medication list for this problem includes:    Metformin Hcl 1000 Mg Tabs (Metformin hcl) ..... One by mouth two times a day  Labs Reviewed: Creat: 0.58 (03/18/2007)    Reviewed HgBA1c results: 8.4 (10/11/2009)  6.7 (11/20/2008)  Complete Medication List: 1)  Metformin Hcl 1000 Mg Tabs (Metformin hcl) .... One by mouth two times a day 2)  Blood Glucose Monitor Kit (Blood glucose monitoring suppl) .... Check sugars once a day 3)  Blood Glucose Test Strp (Glucose blood) .... Check sugars once a day 4)  Lancets Misc (Lancets) .... For checking sugars once a day 5)  Prenatal Vitamins 0.8 Mg Tabs (Prenatal multivit-min-fe-fa) .Marland Kitchen.. 1 tab by mouth daily; dispense generic 6)  Cephalexin 500 Mg Caps (Cephalexin) .Marland Kitchen.. 1 tab by mouth two times a day for 7 days for urinary tract infection  Patient Instructions: 1)  stop smoking! 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    Number of children at home: 0    [facility]    Newborn's physician: [facility]  FOB Information    Husband/Father of baby: Aaliya Maultsby    FOB occupation Dry wall    Phone: [phone]    FOB Comments: involved, supportive  Menstrual History    LMP (date): 10/05/2009    Best Working EDC: 07/17/2010    LMP - Character: light    Menarche: 12 years    Menses interval: irreg days    Menstrual flow 7 days    On BCP's at conception: no    Symptoms since LMP: amenorrhea, nausea, fatigue, irritability, tender  breasts, urinary frequency  Prenatal Visit    FOB name: [facility] Confirmation:    New working [facility]: 07/17/2010    Last menses onset (LMP) date: 10/05/2009 Ultrasound Dating Information:    First U/S on 11/16/2009   Gest age: 41 w 2 d   EDC: 07/17/2010.    Gest age by current sono: 6W 1D    EDC by current sono: 07/17/2010   Past Pregnancy History    Gravida:     1    Term Births:     0    Premature Births:   0    Living Children:   0    Aborta:     0    Elect. Ab:     0    Spont. Ab:     0    Ectopics:     0  Pregnancy # 1    Comments:     current gestation   Genetic History    Father of baby:   Scientist, research (life sciences)     Thalassemia:     mother: no   father: no    Neural tube defect:   mother: no   father: no    Down's Syndrome:   mother: no   father: no    Tay-Sachs:     mother: no   father: no    Sickle Cell Dz/Trait:   mother: no   father: no    Hemophilia:     mother: no   father: no    Muscular Dystrophy:   mother: no   father: no    Cystic Fibrosis:   mother: no   father: no    Huntington's Dz:   mother: no   father: no    Mental Retardation:   mother: yes   father: yes   comments: pt with younger sister with white matter delay, FOB with younger  brother with unknown MR    Fragile X:     mother: no   father: no    Other Genetic or       Chromosomal Dz:   mother: no   father: no    Child with other       birth defect:     mother: no   father: no    > 3 spont. abortions:   mother: no    Hx of stillbirth:     mother: no  Additional Genetic Comments:    sister Selena Batten (27 yo) has white matter delay  Infection Risk History    High Risk Hepatitis B: no    Immunized against Hepatitis B: no    Exposure to TB: no    Patient with history of Genital Herpes: no    Sexual partner with history of Genital Herpes: no    History of STD (GC, Chlamydia, Syphilis, HPV): no    Rash, Viral, or Febrile Illness since LMP: no    Exposure to Cat Litter: no  Environmental Exposures    Xray Exposure since LMP: no    Chemical or other exposure: no    Medication, drug, or alcohol use since LMP: no   Flowsheet View for Follow-up Visit    Estimated weeks of       gestation:     6 2/7    Weight:     286    Blood pressure:   142 / 80    Headache:     few    Nausea/vomiting:  nausea    Edema:     TrLE    Vaginal bleeding:   no    Vaginal discharge:   no    Fundal height:      n/a    FHR:       n/a    Fetal activity:     N/A    Labor symptoms:   no    Smoking:     <0.25    Resident:     Yaxiel Minnie  Appended Document: Orders Update    Clinical Lists Changes  Orders: Added new Test order of HIV-[facility] [id number]) - Signed Added new Test order of Sickle Cell Scr-[facility] ([id number]) - Signed Added new Test order of Urine Culture-[facility] ([id number]) - Signed

## 2010-09-06 NOTE — Consult Note (Signed)
Summary: [facility]  [facility]   Imported By: Knox Royalty 02/26/2010 12:46:39  _____________________________________________________________________  External Attachment:    Type:   Image     Comment:   External Document

## 2010-09-06 NOTE — Assessment & Plan Note (Signed)
Summary: f/u dm,df   Vital Signs:  Patient profile:   25 year old female Height:      65 inches Weight:      291 pounds BMI:     48.60 BSA:     2.32 Temp:     97.9 degrees F Pulse rate:   91 / minute BP sitting:   121 / 84  Vitals Entered By: Jone Baseman CMA (October 11, 2009 8:38 AM) CC: F/U dm Is Patient Diabetic? Yes Did you bring your meter with you today? No Pain Assessment Patient in pain? no        Primary Care Provider:  Myrtie Soman  MD  CC:  F/U dm.  History of Present Illness: 1. Diabetes last A1c: 6/7 4/10 medications: metformin 1000 two times a day; but has been out of her medicine for 2 months; trouble affording the medication; now has the orange card and can afford the meds problems with medications? none blood sugar testing frequency: checks it a few times a week hypoglycemic events?:none subjective: has been more careful with diet, smaller portions and more vegetables; exercises by walking to work (10 minutes each way)  2. Itching Occurs in the legs bilaterally, equally; sometimes during the day but most severe at night  3. Smoking history: restarted about 1-2 months ago after stopping 2/10; says this is due to stress previous attempts at quitting?: stopped 2/10 readiness to quit: thinking about it   Habits & Providers  Alcohol-Tobacco-Diet     Tobacco Status: current     Tobacco Counseling: to quit use of tobacco products     Cigarette Packs/Day: <0.25  Current Medications (verified): 1)  Metformin Hcl 1000 Mg Tabs (Metformin Hcl) .... One By Mouth Two Times A Day  Allergies (verified): No Known Drug Allergies  Social History: Smoked 1 pack per day since 2006 - stopped 2/10 -- now smoking again.  Occ. EtoH usually less than once a month. No other illicit drugs.  High risk sexual behavior. Close to mom. Involved with BF Miguel whom she plans to marry but no definite state. Works at [employer] but would like to go to school to do  something different. Smoking Status:  current Packs/Day:  <0.25  Review of Systems       ROS: denies CP, SOB, fever, chills, bowel or bladder problems, polydipsia/polyuria   Physical Exam  Additional Exam:  General:  Vital signs reviewed -- obese but otherwise normal Alert, appropriate; well-dressed and well-nourished Lungs:  work of breathing unlabored, clear to auscultation bilaterally; no wheezes, rales, or ronchi; good air movement throughout Heart:  regular rate and rhythm, no murmurs; normal s1/s2 Pulses:  DP and radial pulses 2+ bilaterally  Extremities:  no cyanosis, clubbing, or edema Neurologic:  alert and oriented. speech normal.  Skin: diffusely dry and flaking, especially on hands and feet. No other deformity, erythema, drainage, or discharge.   Impression & Recommendations:  Problem # 1:  DIABETES MELLITUS II, UNCOMPLICATED (ICD-250.00) Assessment Deteriorated A1c deteriorated to 8.4%. Out of medicine for 2 months. Will put back on Metformin (now has orange card and can afford the medicine from the health department). Now that A1c result is back will push up follow-up to 3-4 weeks. Pt has lost 14 lbs and congratulated her on that. Continue to follow closely.  Her updated medication list for this problem includes:    Metformin Hcl 1000 Mg Tabs (Metformin hcl) ..... One by mouth two times a day  Orders: A1C-[facility] ([address])  [facility]- Est  Level 4 ([address])  Problem # 2:  OTHER SPECIFIED PRURITIC CONDITIONS (ICD-698.8)  Likely winter itch due to dry area, frequency of bathing and lack of moisturizing. Counseled on appropriate hygeine. Advised benadryl once or twice a week if symptoms are particularly bad at night. To follow-up if not improving but would expect improvement as the weather warms.   Orders: [facility]- Est  Level 4 ([address])  Problem # 3:  TOBACCO DEPENDENCE (ICD-305.1)  counseled to quit. Pt considering. Continue to address.   Orders: [facility]- Est  Level 4  ([address])  Complete Medication List: 1)  Metformin Hcl 1000 Mg Tabs (Metformin hcl) .... One by mouth two times a day  Patient Instructions: 1)  limit shower time to 10 minutes or less -- moisturize immediately after -- use Aveeno, Vaseline Intensive, Neutragena 2)  restart the metformin 3)  we'll call you about your blood work. 4)  follow-up with me in 6-8 weeks Prescriptions: METFORMIN HCL 1000 MG TABS (METFORMIN HCL) one by mouth two times a day  #60 x 2   Entered and Authorized by:   Myrtie Soman  MD   Signed by:   Myrtie Soman  MD on 10/11/2009   Method used:   Faxed to ...       [facility] Department (retail)       [address] 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/  (03/22/2009)   Pap smear due: 05/2008   Smoking status: current  (10/11/2009)   Smoking cessation counseling: yes  (04/14/2008)  Diabetes Mellitus   HgbA1C: 8.4  (10/11/2009)   Hemoglobin A1C due: 11/12/2008    Eye exam: Not documented    Foot exam: Not documented   High risk foot: Not documented   Foot care education: Not documented    Urine microalbumin/creatinine ratio: Not documented  Self-Management Support :   Personal Goals (by the next clinic visit) :     Personal A1C goal: 7  (10/11/2009)     Personal blood pressure goal: 130/80  (10/11/2009)     Personal LDL goal: 70  (10/11/2009)    Diabetes self-management support: Written self-care plan, Education handout, Pre-printed educational material  (10/11/2009)   Diabetes care plan printed   Diabetes education handout printed  Laboratory Results   Blood  Tests   Date/Time Received: October 11, 2009 8:35 AM  Date/Time Reported: October 11, 2009 8:58 AM   HGBA1C: 8.4%   (Normal Range: Non-Diabetic - 3-6%   Control Diabetic - 6-8%)  Comments: ............test performed by...........Marland KitchenTerese Door, CMA

## 2010-09-06 NOTE — Assessment & Plan Note (Signed)
Summary: female problem,tcb   Vital Signs:  Patient profile:   25 year old female Weight:      287.9 pounds Temp:     98 degrees F oral Pulse rate:   98 / minute BP sitting:   130 / 78  (right arm)  Vitals Entered By: Arlyss Repress CMA, (November 15, 2009 9:56 AM) CC: sharp pain in vagina off and on x 2 weeks. LMP ?10-01-09. denies vag d/c. does not use Birth control, Abdominal Pain Is Patient Diabetic? Yes Pain Assessment Patient in pain? no        Primary Care Provider:  Myrtie Soman  MD  CC:  sharp pain in vagina off and on x 2 weeks. LMP ?10-01-09. denies vag d/c. does not use Birth control and Abdominal Pain.  History of Present Illness: 1.  vaginal pain--has always irregular periods, but would usually have a heavy 7-day period when she did have it.  end of feb, had a light 2-day period (spotting).  upreg neg in office in march.  then 2 weeks ago started having vaginal pain and sharp pain and pressure.  intermittent.  on an avg day, happens 10 or more times a day.  lasts about a minute.  no precipitating factors.  no relieving factors.  tried tylenol, which did not help.  assoc symptoms:  breast tenderness, ocasional lower abd and flank pain, occ nausea and vomitting (but attributes that to heartburn).  no fever, vag discharge, dysuria, urinary frequency.  did have some pain with sex once, but was short-lived.    Habits & Providers  Alcohol-Tobacco-Diet     Tobacco Status: current     Tobacco Counseling: to quit use of tobacco products  Allergies: No Known Drug Allergies  Physical Exam  General:  General:  Vital signs reviewed -- obese but otherwise normal Alert, appropriate; well-dressed and well-nourished  Genitalia:  Pelvic Exam:        External: normal female genitalia without lesions or masses        Vagina: normal without lesions or masses; scant whitish discharge        Cervix: normal without lesions or masses        Adnexa: normal bimanual exam without masses or  fullness        Uterus: normal by palpation        Pap smear: not performed Additional Exam:  vital signs reviewed    Impression & Recommendations:  Problem # 1:  PREGNANCY, HIGH RISK (ICD-V23.9) Assessment New set up for u/s for dates.  refer to high risk clinic because of her diabetes.  check urine culture.  continue metformin.   Orders: Ultrasound (Ultrasound) Urine Culture-[facility] ([id number]) Obstetric Referral (Obstetric) [facility]- Est  Level 4 (32355)  Problem # 2:  VULVODYNIA UNSPECIFIED (ICD-625.70) Assessment: New not sure cause of her pain.  perhaps pregnancy?  wet prep showed:  bv; so will treat that.  u/a showed trace LE.  check urine culture.  check G/C,  chlam.   Orders: GC/Chlamydia-[facility] ([id number]) Wet Prep- [facility] (73220) [facility]- Est  Level 4 (25427)  Complete Medication List: 1)  Metformin Hcl 1000 Mg Tabs (Metformin hcl) .... One by mouth two times a day 2)  Blood Glucose Monitor Kit (Blood glucose monitoring suppl) .... Check sugars once a day 3)  Blood Glucose Test Strp (Glucose blood) .... Check sugars once a day 4)  Lancets Misc (Lancets) .... For checking sugars once a day 5)  Prenatal Vitamins 0.8 Mg Tabs (Prenatal multivit-min-fe-fa) .Marland KitchenMarland KitchenMarland Kitchen  1 tab by mouth daily; dispense generic 6)  Metronidazole 0.75 % Gel (Metronidazole) .Marland Kitchen.. 1 applicatorful per vagina at bedtime for 5 days; dispense qs for 5 days  Other Orders: Urinalysis-[facility] (00000) U Preg-[facility] (11914)   Patient Instructions: 1)  It was nice to see you today. 2)  You are pregnant.   3)  We will refer you to high risk clinic. 4)  Start taking your prenatal vitamins. 5)  It is OK to keep taking your metformin for now.   6)  Take the metronidazole I prescribed you for bacterial vaginosis.  7)  We will help set you up with an ultrasound.   Prescriptions: PRENATAL VITAMINS 0.8 MG TABS (PRENATAL MULTIVIT-MIN-FE-FA) 1 tab by mouth daily; dispense generic  #30 x 11   Entered and Authorized by:   [name]  MD   Signed by:   [name] MD on 11/15/2009   Method used:   Print then Give to Patient   RxID:   [id number] METRONIDAZOLE 0.75 % GEL (METRONIDAZOLE) 1 applicatorful per vagina at bedtime for 5 days; dispense qs for 5 days  #1 x 0   Entered and Authorized by:   [name] MD   Signed by:   [name] MD on 11/15/2009   Method used:   Print then Give to Patient   RxID:   [id number] PRENATAL VITAMINS 0.8 MG TABS (PRENATAL MULTIVIT-MIN-FE-FA) 1 tab by mouth daily; dispense generic  #30 x 11   Entered and Authorized by:   [name] MD   Signed by:   [name] MD on 11/15/2009   Method used:   [facility]        [facility] [address] [phone]* (retail)       [address]       [place], [place]  [address]       Ph: [phone] or [phone]       Fax: [phone]   RxID:   [id number]   Laboratory Results   Urine Tests  Date/Time Received: November 15, 2009 10:04 AM  Date/Time Reported: November 15, 2009 10:30 AM   Routine Urinalysis   Color: yellow Appearance: Clear Glucose: 100   (Normal Range: Negative) Bilirubin: negative   (Normal Range: Negative) Ketone: trace (5)   (Normal Range: Negative) Spec. Gravity: 1.020   (Normal Range: 1.003-1.035) Blood: negative   (Normal Range: Negative) pH: 6.5   (Normal Range: 5.0-8.0) Protein: negative   (Normal Range: Negative) Urobilinogen: 0.2   (Normal Range: 0-1) Nitrite: negative   (Normal Range: Negative) Leukocyte Esterace: trace   (Normal Range: Negative)  Urine Microscopic WBC/HPF: 1-5 Bacteria/HPF: 2+ cocci Mucous/HPF: 2+ Epithelial/HPF: 10-20 with several clue cells    Urine HCG: positive Comments: ...............test performed by......Marland KitchenBonnie A. [place], MLS (ASCP)cm  Date/Time Received: November 15, 2009 10:27 AM  Date/Time Reported: November 15, 2009 10:35 AM   [facility] Source: vaginal WBC/hpf: 10-15 Bacteria/hpf: 3+  Cocci Clue cells/hpf:  moderate  Positive whiff Yeast/hpf: none Trichomonas/hpf: none Comments: ...........test performed by...........Marland KitchenTerese Door, CMA

## 2010-09-06 NOTE — Consult Note (Signed)
[facility]   Imported By: De Nurse 12/28/2009 15:46:23  _____________________________________________________________________  External Attachment:    Type:   Image     Comment:   External Document

## 2010-09-06 NOTE — Assessment & Plan Note (Signed)
Summary: yeast per pt (preg)/[place]/[facility]   Vital Signs:  Patient profile:   25 year old female Height:      65 inches Weight:      287 pounds BMI:     47.93 BSA:     2.31 Temp:     98.4 degrees F Pulse rate:   98 / minute BP sitting:   122 / 70  Vitals Entered By: Jone Baseman CMA (December 01, 2009 8:43 AM) CC: ? yeast Is Patient Diabetic? No Pain Assessment Patient in pain? no        Primary Care Provider:  Myrtie Soman  MD  CC:  ? yeast.  History of Present Illness: ? yeast: a few days ago started having itching in vaginal area, thick creamy white discharge.  has bled from scratching.  was recently on abx for asymptomatic bacturia in pregnancy.  this all started after this.  no fevers.    Habits & Providers  Alcohol-Tobacco-Diet     Tobacco Status: never  Current Medications (verified): 1)  Metformin Hcl 1000 Mg Tabs (Metformin Hcl) .... One By Mouth Two Times A Day 2)  Blood Glucose Monitor  Kit (Blood Glucose Monitoring Suppl) .... Check Sugars Once A Day 3)  Blood Glucose Test  Strp (Glucose Blood) .... Check Sugars Once A Day 4)  Lancets  Misc (Lancets) .... For Checking Sugars Once A Day 5)  Prenatal Vitamins 0.8 Mg Tabs (Prenatal Multivit-Min-Fe-Fa) .Marland Kitchen.. 1 Tab By Mouth Daily; Dispense Generic 6)  Terconazole 0.4 % Crea (Terconazole) .Marland Kitchen.. 1 Applicator Full Per Vagina At Bedtime For 7 Days.  Disp Qs  Allergies (verified): No Known Drug Allergies  Social History: Smoking Status:  never  Review of Systems       currently pregnant.  otherwise per HPI.  no concern for other STD exposure  Physical Exam  General:  vitals signs reviewed -- WNL alert, well-developed, well-nourished, and well-hydrated.  obese Genitalia:  normal introitus.  erythema from scratching surrounding vaginal opening.  thin white vaginal discharge without odor.  mucosa pink and moist.  no bleeding noted.    Impression & Recommendations:  Problem # 1:  VAGINITIS  (ICD-616.10) Assessment New  rx with terconazole for 7 days.  if this is too expensive without her insurance she is to get 7 day monistat treatment.  return if worsens.    The following medications were removed from the medication list:    Cephalexin 500 Mg Caps (Cephalexin) .Marland Kitchen... 1 tab by mouth two times a day for 7 days for urinary tract infection Her updated medication list for this problem includes:    Terconazole 0.4 % Crea (Terconazole) .Marland Kitchen... 1 applicator full per vagina at bedtime for 7 days.  disp qs  Orders: [facility]- Est Level  3 ([address]109)  Complete Medication List: 1)  Metformin Hcl 1000 Mg Tabs (Metformin hcl) .... One by mouth two times a day 2)  Blood Glucose Monitor Kit (Blood glucose monitoring suppl) .... Check sugars once a day 3)  Blood Glucose Test Strp (Glucose blood) .... Check sugars once a day 4)  Lancets Misc (Lancets) .... For checking sugars once a day 5)  Prenatal Vitamins 0.8 Mg Tabs (Prenatal multivit-min-fe-fa) .Marland Kitchen.. 1 tab by mouth daily; dispense generic 6)  Terconazole 0.4 % Crea (Terconazole) .Marland Kitchen.. 1 applicator full per vagina at bedtime for 7 days.  disp qs  [facility]  Patient Instructions: 1)  You have a yeast infection as suspected. 2)  I  [facility].  3)  If things worsen please let us know. Prescriptions: TERCONAZOLE 0.4 % CREA (TERCONAZOLE) 1 applicator full per vagina at bedtime for 7 days.  Disp QS  #1 x 0   Entered and Authorized by:   Ancil Boozer  MD   Signed by:   Ancil Boozer  MD on 12/01/2009   Method used:   [facility] to        [facility] [address] [phone]* (retail)       [address]       [place], [place]  [address]       Ph: [phone] or [phone]       Fax: [phone]   RxID:   [id number]   Laboratory Results  Date/Time Received: December 01, 2009 8:54 AM  Date/Time Reported: December 01, 2009 9:04 AM   [facility] [place] Source:  vag WBC/hpf: >20 Bacteria/hpf: 3+  Rods Clue cells/hpf: none  Negative whiff Yeast/hpf: moderate Trichomonas/hpf: none Comments: ...............test performed by......Marland KitchenBonnie A. [place], MLS (ASCP)cm    Appended Document: yeast per pt (preg)/[place]    Clinical Lists Changes  Observations: Added new observation of DMEYEEXAMNXT: 10/20/2010 (12/28/2009 16:29) Added new observation of HTN PROGRESS: N/A (12/28/2009 16:29) Added new observation of HTN FSREVIEW: N/A (12/28/2009 16:29) Added new observation of LIPID PROGRS: N/A (12/28/2009 16:29) Added new observation of LIPID FSREVW: N/A (12/28/2009 16:29) Added new observation of DIAB EYE EX: normal (10/19/2009 16:29)       Prevention & Chronic Care Immunizations   Influenza vaccine: Fluvax Non-MCR  (05/11/2008)   Influenza vaccine due: 05/11/2009    Tetanus booster: Not documented    Pneumococcal vaccine: Not documented  Other Screening   Pap smear: **LOW GRADE SQUAMOUS INTRAEPITHELIAL LESION: CIN-1/ VAIN-1/  (03/22/2009)   Pap smear due: 05/2008   Smoking status: never  (12/01/2009)  Diabetes Mellitus   HgbA1C: 8.4  (10/11/2009)   Hemoglobin A1C due: 11/12/2008    Eye exam: normal  (10/19/2009)   Eye exam due: 10/20/2010    Foot exam: yes  (10/29/2009)   High risk foot: Not documented   Foot care education: Not documented    Urine microalbumin/creatinine ratio: Not documented  Self-Management Support :   Personal Goals (by the next clinic visit) :     Personal A1C goal: 7  (10/11/2009)     Personal blood pressure goal: 130/80  (10/11/2009)     Personal LDL goal: 70  (10/11/2009)    Diabetes self-management support: Written self-care plan, Education handout, Pre-printed educational material  (10/11/2009)

## 2010-09-06 NOTE — Progress Notes (Signed)
Summary: phn msg  Phone Note Call from Patient Call back at Home Phone [phone]   Caller: Patient Summary of Call: pt is returning a call that she think someone called her - pls leave message in chart in case she misses the call again.  she doesn't have VM and she has to be at work at [employer] Initial call taken by: De Nurse,  November 23, 2009 3:10 PM  Follow-up for Phone Call        Aurther Loft,  Did you call her? Follow-up by: Jone Baseman CMA,  November 23, 2009 3:19 PM  Additional Follow-up for Phone Call Additional follow up Details #1::        I don't think so. thanks. Additional Follow-up by: Myrtie Soman  MD,  November 24, 2009 12:19 PM

## 2010-09-06 NOTE — Assessment & Plan Note (Signed)
Summary: f/up,tcb   Vital Signs:  Patient profile:   25 year old female Height:      65 inches Weight:      290.8 pounds BMI:     48.57 Temp:     98.4 degrees F oral Pulse rate:   102 / minute BP sitting:   125 / 72  (left arm) Cuff size:   large  Vitals Entered By: Garen Grams LPN (October 29, 2009 9:59 AM) CC: f/u Is Patient Diabetic? Yes Did you bring your meter with you today? No Pain Assessment Patient in pain? no        Primary Care Provider:  Myrtie Soman  MD  CC:  f/u.  History of Present Illness: Diabetes taking medications: yes -- metformin two times a day  problems with medications?: no  blood sugar testing frequency: infrequently hypoglycemic events?: no subjective: often skips breakfast; weight is stable; works and [employer] and often eats at work; often eats late  ROS chest pain: no   shortness of breath: no   polyuria: sometimes    polydipsia: sometimes    problems with feet: no  Habits & Providers  Alcohol-Tobacco-Diet     Tobacco Status: current     Cigarette Packs/Day: <0.25  Current Medications (verified): 1)  Metformin Hcl 1000 Mg Tabs (Metformin Hcl) .... One By Mouth Two Times A Day 2)  Blood Glucose Monitor  Kit (Blood Glucose Monitoring Suppl) .... Check Sugars Once A Day 3)  Blood Glucose Test  Strp (Glucose Blood) .... Check Sugars Once A Day 4)  Lancets  Misc (Lancets) .... For Checking Sugars Once A Day  Allergies (verified): No Known Drug Allergies  Review of Systems       review of systems as noted in HPI section   Physical Exam  General:  General:  Vital signs reviewed -- obese but otherwise normal, mildly ill appearing. Alert, appropriate; well-dressed and well-nourished   Diabetes Management Exam:    Foot Exam (with socks and/or shoes not present):       Sensory-Pinprick/Light touch:          Left medial foot (L-4): normal          Left dorsal foot (L-5): normal          Left lateral foot (S-1): normal  Right medial foot (L-4): normal          Right dorsal foot (L-5): normal          Right lateral foot (S-1): normal       Sensory-Monofilament:          Left foot: normal          Right foot: normal       Inspection:          Left foot: normal          Right foot: normal       Nails:          Left foot: normal          Right foot: normal   Impression & Recommendations:  Problem # 1:  DIABETES MELLITUS II, UNCOMPLICATED (ICD-250.00) Assessment Unchanged  Discussed importance of taking this issue seriously, especially given her young age. Has recently lost weight, but eating habits are still erratic and show poor food choices. Plan to recheck A1c in 2-3 months. Pt reports compliance with metformin. Will try to get patient to check sugars once a day. States she will try to set an alarm to  help her remember this.  Her updated medication list for this problem includes:    Metformin Hcl 1000 Mg Tabs (Metformin hcl) ..... One by mouth two times a day  Labs Reviewed: Creat: 0.58 (03/18/2007)    Reviewed HgBA1c results: 8.4 (10/11/2009)  6.7 (11/20/2008)  Orders: [facility]- Est Level  3 ([address]65)  Complete Medication List: 1)  Metformin Hcl 1000 Mg Tabs (Metformin hcl) .... One by mouth two times a day 2)  Blood Glucose Monitor Kit (Blood glucose monitoring suppl) .... Check sugars once a day 3)  Blood Glucose Test Strp (Glucose blood) .... Check sugars once a day 4)  Lancets Misc (Lancets) .... For checking sugars once a day  Other Orders: U Preg-[facility] ([id number])  Patient Instructions: 1)  continue the metformin. 2)  start checking your sugars once a day in the morning. 3)  follow-up with me in 1-2 months. 4)  keep up the good work with the weight loss.  Prescriptions: LANCETS  MISC (LANCETS) for checking sugars once a day  #1 box x 11   Entered and Authorized by:   Myrtie Soman  MD   Signed by:   Myrtie Soman  MD on 10/29/2009   Method used:   Faxed to ...       [facility] Department (retail)       [address] [place], [place]  [address]       Ph: [phone]       Fax: [phone]   RxID:   [id number] BLOOD GLUCOSE TEST  STRP (GLUCOSE BLOOD) check sugars once a day  #1 box x 11   Entered and Authorized by:   Myrtie Soman  MD   Signed by:   Myrtie Soman  MD on 10/29/2009   Method used:   Faxed to ...       [facility] Department (retail)       [address] [place], [place]  [address]       Ph: [phone]       Fax: [phone]   RxID:   [id number] BLOOD GLUCOSE MONITOR  KIT (BLOOD GLUCOSE MONITORING SUPPL) check sugars once a day  #1 x 0   Entered and Authorized by:   Myrtie Soman  MD   Signed by:   Myrtie Soman  MD on 10/29/2009   Method used:   Faxed to ...       [facility] Department (retail)       [address] [place], [place]  [address]       Ph: [phone]       Fax: [phone]   RxID:   [id number]   Laboratory Results   Urine Tests  Date/Time Received: October 29, 2009 9:54 AM  Date/Time Reported: October 29, 2009 10:00 AM     Urine HCG: negative Comments: ...........test performed by...........Marland KitchenTerese Door, CMA       Prevention & Chronic Care Immunizations   Influenza vaccine: Fluvax Non-MCR  (05/11/2008)   Influenza vaccine due: 05/11/2009    Tetanus booster: Not documented    Pneumococcal vaccine: Not documented  Other Screening   Pap smear: **LOW GRADE SQUAMOUS INTRAEPITHELIAL LESION: CIN-1/ VAIN-1/  (03/22/2009)   Pap smear due: 05/2008   Smoking status: current  (10/29/2009)   Smoking cessation counseling: yes  (04/14/2008)  Diabetes Mellitus   HgbA1C: 8.4  (10/11/2009)   Hemoglobin  A1C due: 11/12/2008    Eye exam: Not documented    Foot exam: yes  (10/29/2009)   High risk foot: Not documented   Foot care education: Not documented    Urine microalbumin/creatinine ratio: Not documented  Self-Management Support :    Personal Goals (by the next clinic visit) :     Personal A1C goal: 7  (10/11/2009)     Personal blood pressure goal: 130/80  (10/11/2009)     Personal LDL goal: 70  (10/11/2009)    Diabetes self-management support: Written self-care plan, Education handout, Pre-printed educational material  (10/11/2009)

## 2010-09-06 NOTE — Progress Notes (Signed)
Summary: phone call about cultures results.  patient needs antibiotics  Phone Note Outgoing Call   Call placed by: [name] MD,  November 17, 2009 1:42 PM Summary of Call: Attempted to call pt about positive urine culture.  needs to start antibiotics.  no answer/no voice mail.  Would you mind trying to call her and asking where she would like her antibiotics sent?  I will put in the prescription.   Initial call taken by: [name] MD,  November 17, 2009 1:43 PM  Follow-up for Phone Call        spoke with pt and informed her of her results and let her know that meds were sent to [facility] Follow-up by: Loralee Pacas CMA,  November 17, 2009 5:08 PM    New/Updated Medications: CEPHALEXIN 500 MG CAPS (CEPHALEXIN) 1 tab by mouth two times a day for 7 days for urinary tract infection Prescriptions: CEPHALEXIN 500 MG CAPS (CEPHALEXIN) 1 tab by mouth two times a day for 7 days for urinary tract infection  #14 x 0   Entered and Authorized by:   [name] MD   Signed by:   [name] MD on 11/17/2009   Method used:   [facility]        [facility] [address] [phone]* (retail)       [address]       [place], [place]  [address]       Ph: [phone] or [phone]       Fax: [phone]   RxID:   [id number]

## 2010-09-06 NOTE — Letter (Signed)
Summary: [facility]  [facility]   Imported By: Knox Royalty 03/11/2010 09:55:16  _____________________________________________________________________  External Attachment:    Type:   Image     Comment:   External Document

## 2010-09-08 NOTE — Assessment & Plan Note (Signed)
Summary: kh   Vital Signs:  Patient profile:   25 year old female Height:      65 inches Weight:      273 pounds BMI:     45.59 BSA:     2.26 Temp:     98.3 degrees F Pulse rate:   86 / minute BP sitting:   135 / 79  Vitals Entered By: Jone Baseman CMA (August 17, 2010 1:56 PM) CC: f/u Is Patient Diabetic? Yes Did you bring your meter with you today? No Pain Assessment Patient in pain? no        Primary Care Provider:  Myrtie Soman  MD  CC:  f/u.  History of Present Illness: 1. DMII:  Pt with long standing diabetes.  She just had a baby about 1 month ago.  She was very diligent when she was pregnant and was eating a much better diet.  She was also put on Glyburide.  She hasn't been as consistent with her diet but did go see a nutritionist and is aware that she needs to cut back on the starchy carbs in order to continue to lose weight.  She is interested in eventually coming off of her medicines if possible.  She has had some stomach upset in the past couple of weeks that is similar to when she was started on Metformin.  ROS: denies vision changes, numbness/weakness, skin changes  2. Obesity:  She has lost 40 lbs total from her heaviest.  She was committed to making some dietary changes while pregnant but has since not been eating well.  Her goal weight is around 200 lbs.    Habits & Providers  Alcohol-Tobacco-Diet     Tobacco Status: never     Tobacco Counseling: to quit use of tobacco products     Cigarette Packs/Day: <0.25  Current Medications (verified): 1)  Metformin Hcl 1000 Mg Tabs (Metformin Hcl) .... 1/2 By Mouth Two Times A Day 2)  Blood Glucose Monitor  Kit (Blood Glucose Monitoring Suppl) .... Check Sugars Once A Day 3)  Blood Glucose Test  Strp (Glucose Blood) .... Check Sugars Once A Day 4)  Lancets  Misc (Lancets) .... For Checking Sugars Once A Day 5)  Prenatal Vitamins 0.8 Mg Tabs (Prenatal Multivit-Min-Fe-Fa) .Marland Kitchen.. 1 Tab By Mouth Daily; Dispense  Generic 6)  Glyburide 2.5 Mg Tabs (Glyburide) .Marland Kitchen.. 1 Tab in The Morning and 3 Tabs in The Evening  Allergies: No Known Drug Allergies  Past History:  Past Medical History:  childhood Varicella, f. glu= 130 TSH= 1.347  3/06, FT NSVD, no prenatal care, Ht=64.5 inches, menarche 25 yo  likely PCOS: had small cysts on [place] 2008, DUB, obesity, glucose intolerance  Social History: Reviewed history from 11/23/2009 and no changes required. Smoked 1 pack per day since 2006 - stopped 2/10 -- now smoking again.  Occ. EtoH usually less than once a month. No other illicit drugs.  High risk sexual behavior. Close to mom. Involved with BF Miguel.  Works at [employer] but would like to go to school to do something different, will start soon.  Physical Exam  General:  Vitals reviewed, obese, no acute distress Eyes:  vision grossly intact.  fundoscopic exam benign Lungs:  Normal respiratory effort, chest expands symmetrically. Lungs are clear to auscultation, no crackles or wheezes. Heart:  Normal rate and regular rhythm. S1 and S2 normal without gallop, murmur, click, rub or other extra sounds. Abdomen:  Abdomen obese, soft, nontender. Bowel sounds normoactive.  Neurologic:  alert & oriented X3 and gait normal.   Skin:  no suspicious lesions.   Psych:  normally interactive, not anxious appearing, and not depressed appearing.     Impression & Recommendations:  Problem # 1:  DIABETES MELLITUS II, UNCOMPLICATED (ICD-250.00) Assessment Improved A1C at goal.  She is willing to continue with diet changes to try and lose weight.  Will decreased the Metformin dose to 500mg  twice daily.  Advised her to check her blood sugars at home. Her updated medication list for this problem includes:    Metformin Hcl 1000 Mg Tabs (Metformin hcl) .Marland Kitchen... 1/2 by mouth two times a day    Glyburide 2.5 Mg Tabs (Glyburide) .Marland Kitchen... 1 tab in the morning and 3 tabs in the evening  Orders: A1C-[facility] (56433) [facility]- Est  Level 4  (29518)  Problem # 2:  OBESITY, NOS (ICD-278.00) Assessment: Improved  40lb weight loss from heaviest.  Reinforced necessary dietary changes to lose weight.  She seems interested.  Orders: [facility]- Est  Level 4 (84166)  Complete Medication List: 1)  Metformin Hcl 1000 Mg Tabs (Metformin hcl) .... 1/2 by mouth two times a day 2)  Blood Glucose Monitor Kit (Blood glucose monitoring suppl) .... Check sugars once a day 3)  Blood Glucose Test Strp (Glucose blood) .... Check sugars once a day 4)  Lancets Misc (Lancets) .... For checking sugars once a day 5)  Prenatal Vitamins 0.8 Mg Tabs (Prenatal multivit-min-fe-fa) .Marland Kitchen.. 1 tab by mouth daily; dispense generic 6)  Glyburide 2.5 Mg Tabs (Glyburide) .Marland Kitchen.. 1 tab in the morning and 3 tabs in the evening  Patient Instructions: 1)  Your A1C was great at 5.5 2)  This is likely from you losing weight during your pregnancy 3)  Since you are planning on continuing to lose weight we will decrease your Metformin dose 4)  Start taking 500mg  twice a day 5)  Please schedule a follow up appointment in 3 months to recheck   Orders Added: 1)  A1C-[facility] [83036] 2)  [facility]- Est  Level 4 [06301]    Laboratory Results   Blood Tests   Date/Time Received: August 17, 2010 1:51 PM  Date/Time Reported: August 17, 2010 2:09 PM   HGBA1C: 5.5%   (Normal Range: Non-Diabetic - 3-6%   Control Diabetic - 6-8%)  Comments: ...............test performed by......Marland KitchenBonnie A. [place], MLS (ASCP)cm

## 2010-10-18 LAB — CBC
HCT: 32.4 % — ABNORMAL LOW (ref 36.0–46.0)
HCT: 36.1 % (ref 36.0–46.0)
Hemoglobin: 12.3 g/dL (ref 12.0–15.0)
MCH: 29.3 pg (ref 26.0–34.0)
MCHC: 34 g/dL (ref 30.0–36.0)
MCHC: 34.7 g/dL (ref 30.0–36.0)
MCV: 86.4 fL (ref 78.0–100.0)
Platelets: 189 10*3/uL (ref 150–400)
RBC: 3.75 MIL/uL — ABNORMAL LOW (ref 3.87–5.11)
WBC: 12.1 10*3/uL — ABNORMAL HIGH (ref 4.0–10.5)

## 2010-10-18 LAB — POCT URINALYSIS DIPSTICK
Bilirubin Urine: NEGATIVE
Bilirubin Urine: NEGATIVE
Bilirubin Urine: NEGATIVE
Glucose, UA: NEGATIVE mg/dL
Glucose, UA: NEGATIVE mg/dL
Glucose, UA: NEGATIVE mg/dL
Hgb urine dipstick: NEGATIVE
Hgb urine dipstick: NEGATIVE
Hgb urine dipstick: NEGATIVE
Ketones, ur: 15 mg/dL — AB
Ketones, ur: NEGATIVE mg/dL
Ketones, ur: NEGATIVE mg/dL
Protein, ur: NEGATIVE mg/dL
Protein, ur: NEGATIVE mg/dL
Specific Gravity, Urine: 1.02 (ref 1.005–1.030)
Specific Gravity, Urine: 1.02 (ref 1.005–1.030)
Specific Gravity, Urine: 1.025 (ref 1.005–1.030)
Specific Gravity, Urine: 1.025 (ref 1.005–1.030)
Urobilinogen, UA: 0.2 mg/dL (ref 0.0–1.0)
Urobilinogen, UA: 0.2 mg/dL (ref 0.0–1.0)
pH: 6 (ref 5.0–8.0)
pH: 6.5 (ref 5.0–8.0)

## 2010-10-18 LAB — GLUCOSE, CAPILLARY: Glucose-Capillary: 117 mg/dL — ABNORMAL HIGH (ref 70–99)

## 2010-10-19 LAB — POCT URINALYSIS DIPSTICK
Bilirubin Urine: NEGATIVE
Hgb urine dipstick: NEGATIVE
Hgb urine dipstick: NEGATIVE
Hgb urine dipstick: NEGATIVE
Ketones, ur: 15 mg/dL — AB
Nitrite: NEGATIVE
Protein, ur: NEGATIVE mg/dL
Protein, ur: NEGATIVE mg/dL
Specific Gravity, Urine: 1.02 (ref 1.005–1.030)
Specific Gravity, Urine: 1.02 (ref 1.005–1.030)
Specific Gravity, Urine: 1.025 (ref 1.005–1.030)
Urobilinogen, UA: 0.2 mg/dL (ref 0.0–1.0)
Urobilinogen, UA: 0.2 mg/dL (ref 0.0–1.0)
pH: 7 (ref 5.0–8.0)
pH: 7 (ref 5.0–8.0)

## 2010-10-20 LAB — POCT URINALYSIS DIPSTICK
Bilirubin Urine: NEGATIVE
Bilirubin Urine: NEGATIVE
Glucose, UA: NEGATIVE mg/dL
Hgb urine dipstick: NEGATIVE
Hgb urine dipstick: NEGATIVE
Hgb urine dipstick: NEGATIVE
Ketones, ur: 15 mg/dL — AB
Ketones, ur: NEGATIVE mg/dL
Nitrite: NEGATIVE
Nitrite: NEGATIVE
Nitrite: NEGATIVE
Nitrite: NEGATIVE
Protein, ur: NEGATIVE mg/dL
Protein, ur: NEGATIVE mg/dL
Protein, ur: NEGATIVE mg/dL
Protein, ur: NEGATIVE mg/dL
Protein, ur: NEGATIVE mg/dL
Specific Gravity, Urine: 1.025 (ref 1.005–1.030)
Specific Gravity, Urine: 1.025 (ref 1.005–1.030)
Urobilinogen, UA: 1 mg/dL (ref 0.0–1.0)
Urobilinogen, UA: 0.2 mg/dL (ref 0.0–1.0)
Urobilinogen, UA: 1 mg/dL (ref 0.0–1.0)
Urobilinogen, UA: 0.2 mg/dL (ref 0.0–1.0)
pH: 6.5 (ref 5.0–8.0)
pH: 7 (ref 5.0–8.0)
pH: 6.5 (ref 5.0–8.0)
pH: 7 (ref 5.0–8.0)

## 2010-10-21 LAB — POCT URINALYSIS DIPSTICK
Bilirubin Urine: NEGATIVE
Bilirubin Urine: NEGATIVE
Glucose, UA: NEGATIVE mg/dL
Glucose, UA: NEGATIVE mg/dL
Glucose, UA: NEGATIVE mg/dL
Hgb urine dipstick: NEGATIVE
Nitrite: NEGATIVE
Nitrite: NEGATIVE
Nitrite: NEGATIVE
Protein, ur: NEGATIVE mg/dL
Protein, ur: NEGATIVE mg/dL
Protein, ur: NEGATIVE mg/dL
Specific Gravity, Urine: 1.02 (ref 1.005–1.030)
Urobilinogen, UA: 0.2 mg/dL (ref 0.0–1.0)
Urobilinogen, UA: 0.2 mg/dL (ref 0.0–1.0)
Urobilinogen, UA: 0.2 mg/dL (ref 0.0–1.0)
pH: 6.5 (ref 5.0–8.0)

## 2010-10-22 LAB — POCT URINALYSIS DIP (DEVICE)
Bilirubin Urine: NEGATIVE
Glucose, UA: NEGATIVE mg/dL
Glucose, UA: NEGATIVE mg/dL
Nitrite: NEGATIVE
Nitrite: NEGATIVE
Urobilinogen, UA: 0.2 mg/dL (ref 0.0–1.0)

## 2010-10-23 LAB — POCT URINALYSIS DIP (DEVICE)
Bilirubin Urine: NEGATIVE
Bilirubin Urine: NEGATIVE
Bilirubin Urine: NEGATIVE
Bilirubin Urine: NEGATIVE
Glucose, UA: NEGATIVE mg/dL
Glucose, UA: NEGATIVE mg/dL
Glucose, UA: NEGATIVE mg/dL
Glucose, UA: NEGATIVE mg/dL
Hgb urine dipstick: NEGATIVE
Hgb urine dipstick: NEGATIVE
Ketones, ur: NEGATIVE mg/dL
Ketones, ur: NEGATIVE mg/dL
Ketones, ur: 40 mg/dL — AB
Nitrite: NEGATIVE
Nitrite: NEGATIVE
Protein, ur: NEGATIVE mg/dL
Protein, ur: NEGATIVE mg/dL
Specific Gravity, Urine: 1.02 (ref 1.005–1.030)
Specific Gravity, Urine: 1.02 (ref 1.005–1.030)
Specific Gravity, Urine: 1.02 (ref 1.005–1.030)
Urobilinogen, UA: 0.2 mg/dL (ref 0.0–1.0)
Urobilinogen, UA: 0.2 mg/dL (ref 0.0–1.0)
Urobilinogen, UA: 1 mg/dL (ref 0.0–1.0)
pH: 6.5 (ref 5.0–8.0)
pH: 7 (ref 5.0–8.0)

## 2010-10-24 LAB — URINALYSIS, ROUTINE W REFLEX MICROSCOPIC
Bilirubin Urine: NEGATIVE
Leukocytes, UA: NEGATIVE
Nitrite: NEGATIVE
Specific Gravity, Urine: 1.02 (ref 1.005–1.030)
pH: 8.5 — ABNORMAL HIGH (ref 5.0–8.0)

## 2010-10-24 LAB — URINE MICROSCOPIC-ADD ON

## 2010-10-24 LAB — CBC
HCT: 37.2 % (ref 36.0–46.0)
Hemoglobin: 12.9 g/dL (ref 12.0–15.0)
MCV: 84.3 fL (ref 78.0–100.0)
RBC: 4.42 MIL/uL (ref 3.87–5.11)
WBC: 8.2 10*3/uL (ref 4.0–10.5)

## 2010-11-05 ENCOUNTER — Other Ambulatory Visit: Payer: Self-pay | Admitting: Family Medicine

## 2010-11-05 NOTE — Telephone Encounter (Signed)
Refill request

## 2010-11-08 ENCOUNTER — Telehealth: Payer: Self-pay | Admitting: Family Medicine

## 2010-11-08 ENCOUNTER — Telehealth: Payer: Self-pay | Admitting: *Deleted

## 2010-11-08 MED ORDER — GLYBURIDE 2.5 MG PO TABS: ORAL_TABLET | ORAL | Status: DC

## 2010-11-08 NOTE — Telephone Encounter (Signed)
Refill request

## 2010-11-08 NOTE — Telephone Encounter (Signed)
Pt want to know if she can double her metformin dosage until she can get her glypuride filled.  Please call her back

## 2010-11-08 NOTE — Telephone Encounter (Signed)
Asking for refill on glypuride, [facility].

## 2010-11-08 NOTE — Telephone Encounter (Signed)
Told her NOT to double up on the other med. Called pharmacy & gave a refill

## 2010-11-08 NOTE — Telephone Encounter (Signed)
Called and left message that I will get MD to refill med today , not to double up on metformin. Will send message to Dr. Wallene Huh now.

## 2010-11-25 ENCOUNTER — Ambulatory Visit (INDEPENDENT_AMBULATORY_CARE_PROVIDER_SITE_OTHER): Payer: Self-pay | Admitting: Family Medicine

## 2010-11-25 VITALS — BP 106/73 | HR 91 | Temp 98.2°F | Ht 64.75 in | Wt 278.0 lb

## 2010-11-25 DIAGNOSIS — R197 Diarrhea, unspecified: Secondary | ICD-10-CM | POA: Insufficient documentation

## 2010-11-25 MED ORDER — PROMETHAZINE HCL 12.5 MG PO TABS: 25.0000 mg | ORAL_TABLET | Freq: Four times a day (QID) | ORAL | Status: DC | PRN

## 2010-11-25 NOTE — Patient Instructions (Signed)
Lot of water  If diarrhea persists for 4-5 days you may buy over the counter Imodium AD Eat healthy Phenergan will help with nausea but will make you sleepy, make sure the baby is safe

## 2010-11-25 NOTE — Assessment & Plan Note (Signed)
No treatment at this point, prescribed phenergan to treat nausea but really do not think she needs any meds.  Do not think this is gastroparesis.  Wrote a note for her to be out of work today and tomorrow but to return on Sunday.  Likely viral, or related to food choices.

## 2010-11-25 NOTE — Progress Notes (Signed)
  Subjective:    Patient ID: Krystal [name], female    DOB: [birth year], 25 y.o.   MRN: [id number]  HPI One day of diarrhea and one episode of emesis.  Called off from work and needs note.  Wants to eat but is afraid to do so.  Type 2 DM diagnosed at age 65, has a 86 month old.  A1C have been under 7%.    Review of Systems  Constitutional: Positive for appetite change. Negative for fever, chills and fatigue.  Gastrointestinal: Positive for nausea, vomiting and diarrhea. Negative for abdominal pain and abdominal distention.  Genitourinary: Negative for dysuria.       Objective:   Physical Exam  Constitutional:       Morbid obesity  Cardiovascular: Normal rate, regular rhythm and normal heart sounds.   Pulmonary/Chest: Effort normal and breath sounds normal.  Abdominal: Soft. Bowel sounds are normal. She exhibits no distension. There is no tenderness. There is no rebound and no guarding.          Assessment & Plan:

## 2010-12-01 ENCOUNTER — Ambulatory Visit (INDEPENDENT_AMBULATORY_CARE_PROVIDER_SITE_OTHER): Payer: Self-pay | Admitting: Family Medicine

## 2010-12-01 ENCOUNTER — Encounter: Payer: Self-pay | Admitting: Family Medicine

## 2010-12-01 DIAGNOSIS — L240 Irritant contact dermatitis due to detergents: Secondary | ICD-10-CM

## 2010-12-01 NOTE — Patient Instructions (Signed)
Use benedryl for itching.  Use hydrocortisone cream on skin for itching.  Switch back to your normal detergent.

## 2010-12-01 NOTE — Progress Notes (Signed)
  Subjective:    Patient ID: Krystal [name], female    DOB: [birth year], 25 y.o.   MRN: [id number]  HPI Rash x 3 days: Itchy, left forearm, back of neck and front of neck also on front of thighs.  No drainage.  No crusting.  Never had this in past.  No fever.  Had n/v viral illness on Friday to Saturday last weekend.  The rash started on Tuesday. Recent change in laundry detergent on Sunday. No other changes in soaps. Pt states that she has a hx of sensitive skin.  Use benedryl for itching.  Use hydrocortisone cream on skin for itching.  Switch back to your normal detergent.  Review of Systems    as per above Objective:   Physical Exam  Constitutional: She appears well-developed.       obese  Pulmonary/Chest: Effort normal. No respiratory distress.  Skin:       Small red papules across back, scatter across chest and on lateral arms.  + dermatographia on exam          Assessment & Plan:

## 2010-12-01 NOTE — Assessment & Plan Note (Signed)
Pt instructed to change back to normal detergent.  benedryl prn and hydrocortisone prn.  Pt to return if new or worsening of symptoms.

## 2010-12-14 ENCOUNTER — Ambulatory Visit (INDEPENDENT_AMBULATORY_CARE_PROVIDER_SITE_OTHER): Payer: Self-pay | Admitting: Family Medicine

## 2010-12-14 ENCOUNTER — Other Ambulatory Visit (HOSPITAL_COMMUNITY)
Admission: RE | Admit: 2010-12-14 | Discharge: 2010-12-14 | Disposition: A | Discharge status: Home / Self Care | Payer: Self-pay | Source: Ambulatory Visit | Attending: Family Medicine | Admitting: Family Medicine

## 2010-12-14 ENCOUNTER — Encounter: Payer: Self-pay | Admitting: Family Medicine

## 2010-12-14 DIAGNOSIS — N760 Acute vaginitis: Secondary | ICD-10-CM

## 2010-12-14 DIAGNOSIS — Z124 Encounter for screening for malignant neoplasm of cervix: Secondary | ICD-10-CM

## 2010-12-14 DIAGNOSIS — Z01419 Encounter for gynecological examination (general) (routine) without abnormal findings: Secondary | ICD-10-CM | POA: Insufficient documentation

## 2010-12-14 DIAGNOSIS — E119 Type 2 diabetes mellitus without complications: Secondary | ICD-10-CM

## 2010-12-14 DIAGNOSIS — R109 Unspecified abdominal pain: Secondary | ICD-10-CM

## 2010-12-14 LAB — POCT GLYCOSYLATED HEMOGLOBIN (HGB A1C): Hemoglobin A1C: 5.9

## 2010-12-14 MED ORDER — METFORMIN HCL 1000 MG PO TABS: 500.0000 mg | ORAL_TABLET | Freq: Two times a day (BID) | ORAL | Status: DC

## 2010-12-14 MED ORDER — PROBIOTIC FORMULA PO CAPS: 2.0000 | ORAL_CAPSULE | Freq: Two times a day (BID) | ORAL | Status: DC

## 2010-12-14 MED ORDER — BLOOD GLUCOSE MONITOR KIT: PACK | Status: DC

## 2010-12-14 MED ORDER — GLYBURIDE 2.5 MG PO TABS: ORAL_TABLET | ORAL | Status: DC

## 2010-12-14 NOTE — Patient Instructions (Signed)
We will continue the current medications Please schedule an appointment with Dr. Raymondo Band in the pharmacy clinic to talk about smoking cessation Try Probiotics for the abdominal discomfort Let me know if your tooth starts to hurt more.  You can try Sensodyne for sensitive teeth.

## 2010-12-14 NOTE — Assessment & Plan Note (Signed)
Started after a viral GI bug.  Likely related to dysbiosis.  Try probiotics.

## 2010-12-14 NOTE — Progress Notes (Signed)
  Subjective:     Krystal [name] is a 25 y.o. female and is here for a comprehensive physical exam. The patient reports problems - stomach discomfort and tooth pain.  History   Social History  . Marital Status: Single    Spouse Name: N/A    Number of Children: N/A  . Years of Education: N/A   Occupational History  . Not on file.   Social History Main Topics  . Smoking status: Current Everyday Smoker -- 0.3 packs/day    Types: Cigarettes  . Smokeless tobacco: Never Used  . Alcohol Use: Not on file  . Drug Use: Not on file  . Sexually Active: Not on file   Other Topics Concern  . Not on file   Social History Narrative  . No narrative on file   Health Maintenance  Topic Date Due  . Pap Smear  04/18/2006  . Tetanus/tdap  04/19/2007    The following portions of the patient's history were reviewed and updated as appropriate: allergies, current medications, past family history, past medical history, past social history, past surgical history and problem list.  Review of Systems A comprehensive review of systems was negative.   Objective:    BP 132/90  Pulse 97  Temp(Src) 97.6 F (36.4 C) (Oral)  Wt 281 lb 11.2 oz (127.778 kg)  LMP 11/29/2010 General appearance: morbidly obese Head: Normocephalic, without obvious abnormality, atraumatic Eyes: conjunctivae/corneas clear. PERRL, EOM's intact. Fundi benign. Ears: normal TM's and external ear canals both ears Nose: Nares normal. Septum midline. Mucosa normal. No drainage or sinus tenderness. Throat: lips, mucosa, and tongue normal; teeth and gums normal Neck: no adenopathy, no carotid bruit, no JVD, supple, symmetrical, trachea midline and thyroid not enlarged, symmetric, no tenderness/mass/nodules Lungs: clear to auscultation bilaterally Heart: regular rate and rhythm, S1, S2 normal, no murmur, click, rub or gallop Abdomen: soft, non-tender; bowel sounds normal; no masses,  no organomegaly Pelvic: cervix normal in  appearance, external genitalia normal, no adnexal masses or tenderness, no cervical motion tenderness, rectovaginal septum normal, uterus normal size, shape, and consistency and vagina normal without discharge Extremities: extremities normal, atraumatic, no cyanosis or edema Neurologic: Grossly normal    Assessment:    Healthy female exam.   Dysbiosis  Tooth pain  Tobacco use     Plan:     Probiotics  Meet with Dr. Raymondo Band for smoking cessation  See After Visit Summary for Counseling Recommendations

## 2010-12-15 LAB — GC/CHLAMYDIA PROBE AMP, GENITAL
Chlamydia, DNA Probe: NEGATIVE
GC Probe Amp, Genital: NEGATIVE

## 2010-12-16 ENCOUNTER — Encounter: Payer: Self-pay | Admitting: Family Medicine

## 2010-12-24 ENCOUNTER — Emergency Department (HOSPITAL_COMMUNITY)
Admission: EM | Admit: 2010-12-24 | Discharge: 2010-12-24 | Disposition: A | Discharge status: Home / Self Care | Payer: Self-pay | Attending: Emergency Medicine | Admitting: Emergency Medicine

## 2010-12-24 ENCOUNTER — Emergency Department (HOSPITAL_COMMUNITY): Payer: Self-pay

## 2010-12-24 DIAGNOSIS — E119 Type 2 diabetes mellitus without complications: Secondary | ICD-10-CM | POA: Insufficient documentation

## 2010-12-24 DIAGNOSIS — Y92009 Unspecified place in unspecified non-institutional (private) residence as the place of occurrence of the external cause: Secondary | ICD-10-CM | POA: Insufficient documentation

## 2010-12-24 DIAGNOSIS — Z79899 Other long term (current) drug therapy: Secondary | ICD-10-CM | POA: Insufficient documentation

## 2010-12-24 DIAGNOSIS — F3289 Other specified depressive episodes: Secondary | ICD-10-CM | POA: Insufficient documentation

## 2010-12-24 DIAGNOSIS — S99929A Unspecified injury of unspecified foot, initial encounter: Secondary | ICD-10-CM | POA: Insufficient documentation

## 2010-12-24 DIAGNOSIS — M25569 Pain in unspecified knee: Secondary | ICD-10-CM | POA: Insufficient documentation

## 2010-12-24 DIAGNOSIS — M25469 Effusion, unspecified knee: Secondary | ICD-10-CM | POA: Insufficient documentation

## 2010-12-24 DIAGNOSIS — F329 Major depressive disorder, single episode, unspecified: Secondary | ICD-10-CM | POA: Insufficient documentation

## 2010-12-24 DIAGNOSIS — S0990XA Unspecified injury of head, initial encounter: Secondary | ICD-10-CM | POA: Insufficient documentation

## 2010-12-24 DIAGNOSIS — S8990XA Unspecified injury of unspecified lower leg, initial encounter: Secondary | ICD-10-CM | POA: Insufficient documentation

## 2010-12-27 ENCOUNTER — Ambulatory Visit: Payer: Self-pay | Admitting: Pharmacist

## 2010-12-29 ENCOUNTER — Ambulatory Visit: Payer: Self-pay | Admitting: Family Medicine

## 2010-12-31 ENCOUNTER — Other Ambulatory Visit: Payer: Self-pay | Admitting: Family Medicine

## 2011-01-02 NOTE — Telephone Encounter (Signed)
Refill request

## 2011-01-03 ENCOUNTER — Encounter: Payer: Self-pay | Admitting: Pharmacist

## 2011-01-03 ENCOUNTER — Ambulatory Visit (INDEPENDENT_AMBULATORY_CARE_PROVIDER_SITE_OTHER): Payer: Self-pay | Admitting: Pharmacist

## 2011-01-03 VITALS — Ht 65.5 in | Wt 282.8 lb

## 2011-01-03 DIAGNOSIS — F17200 Nicotine dependence, unspecified, uncomplicated: Secondary | ICD-10-CM

## 2011-01-03 MED ORDER — VARENICLINE TARTRATE 1 MG PO TABS: 1.0000 mg | ORAL_TABLET | Freq: Two times a day (BID) | ORAL | Status: DC

## 2011-01-03 MED ORDER — VARENICLINE TARTRATE 0.5 MG X 11 & 1 MG X 42 PO MISC: ORAL | Status: DC

## 2011-01-03 NOTE — Assessment & Plan Note (Signed)
A: Patient has been smoking 1/2 ppd x 6 years (3 pack years) and currently is in the preparation stage of smoking cessation. Her main motivations to quit include her health, her child, and her fiance. She has had 2 quit attempts in the past, one with Chantix and the other "cold turkey"--both of which only lasted 2-3 months. She does experience coughing in the morning when she gets up and has her first cigarette of the day within 30 minutes of waking up. She smokes outside her home and avoids smoking in the car when her child is in it. She does take a smoke break at work occasionally when she is prompted by her uncle. Her favorite cigarettes are [employer]. Her hesitations with quitting are "gaining weight" as she has currently been struggling to lose weight and keep it off.   P: The patient was encouraged to taper down cigarette intake down to patient's goal of 5 cigs/day over the next couple of weeks to her planned quit date of June 17th. She was counseled to try cutting out cigarettes 1 at a time and replacing it with a healthy activity such as walking around the block. She was encouraged that when she craved food in place of cigarretes to grab more healthy foods such as celery, broccoli, and carrots instead of junk food. A script was sent into the [place] [facility] and the patient was educated that she would need to make an appointment to fill out paperwork. The patient was also counseled to take Chantix with food to avoid GI upset and of potential neurological side effects. The patient is to follow up at the Rx clinic towards the end of June.   Office visit time: 35 minutes  Patient seen with Georgina Pillion, PharmD Resident

## 2011-01-03 NOTE — Progress Notes (Signed)
  Subjective:    Patient ID: Krystal [name], female    DOB: [birth year], 25 y.o.   MRN: [id number]  HPI 25 y.o. F presents to Rx clinic for tobacco cessation counseling. She has been smoking 1/2 ppd for ~6 years giving her a 3 pack year history. Her smoking began as a teenager and was influenced by the fact that all of her immediate and close family (siblings, parents, aunts, uncles) smoke. Her current triggers for smoking are mostly stress. She lives with her fiance and their 67 month old. Her days are busy with taking care of her child & fiance, paying bills, and working at [employer]. She claims she is the "one holding everything together" in their family. Other triggers outside of stress involve arguments with her fiance and after meals.    The patient has attempted to quit smoking twice. The first time she took 2 weeks of Chantix, quit smoking, and then restarted in ~2-3 months. The second time she quit "cold [place]" in the middle of her pregnancy for ~2-3 months and then restarted at the end of her pregnancy because of stress and "weight gain". She did note that she was able to breathe better when she quit for those short durations.    Review of Systems     Objective:   Physical Exam        Assessment & Plan:   Tobacco Dependence: A: Patient has been smoking 1/2 ppd x 6 years (3 pack years) and currently is in the preparation stage of smoking cessation. Her main motivations to quit include her health, her child, and her fiance. She has had 2 quit attempts in the past, one with Chantix and the other "cold turkey"--both of which only lasted 2-3 months. She does experience coughing in the morning when she gets up and has her first cigarette of the day within 30 minutes of waking up. She smokes outside her home and avoids smoking in the car when her child is in it. She does take a smoke break at work occasionally when she is prompted by her uncle. Her favorite cigarettes are [employer]. Her hesitations with quitting are "gaining weight" as she has currently been struggling to lose weight and keep it off.  P: The patient was encouraged to taper down cigarette intake down to patient's goal of 5 cigs/day over the next couple of weeks to her planned quit date of June 17th. She was counseled to try cutting out cigarettes 1 at a time and replacing it with a healthy activity such as walking around the block. She was encouraged that when she craved food in place of cigarretes to grab more healthy foods such as celery, broccoli, and carrots instead of junk food. A script was sent into the [facility] and the patient was educated that she would need to make an appointment to fill out paperwork. The patient was also counseled to take Chantix with food to avoid GI upset and of potential neurological side effects. The patient is to follow up at the Rx clinic towards the end of June.  Office visit time: 35 minutes Patient seen with Georgina Pillion, PharmD Resident

## 2011-01-03 NOTE — Patient Instructions (Addendum)
Continue your positive motivation to quit smoking by your quit date of June 15th Work on tapering down your cigarettes slowly to your personal goal of 5 cigs/day over the next week Try to cut out cigarettes at certain times of days (i.e. after meals, not in the car, etc.) Incorporate physical activity in the place of smoking a cigarette (i.e. Going for a walk  right after a meal) We will send your [facility]antix script to [facility] and work on getting that filled.  The phone number for the [facility] is [phone] When you get the Chantix filled it is important to take it with meals in order to prevent GI side effects Follow up at Rx clinic towards the end of June

## 2011-01-04 NOTE — Telephone Encounter (Signed)
Refill request

## 2011-01-05 NOTE — Telephone Encounter (Signed)
2nd Refill request

## 2011-01-06 NOTE — Telephone Encounter (Signed)
Refill request

## 2011-01-09 ENCOUNTER — Ambulatory Visit: Payer: Self-pay | Admitting: Family Medicine

## 2011-01-09 NOTE — Progress Notes (Signed)
  Subjective:    Patient ID: Krystal [name], female    DOB: [birth year], 25 y.o.   MRN: [id number]  HPI Reviewed and agree with Dr. Macky Lower management.     Review of Systems     Objective:   Physical Exam        Assessment & Plan:

## 2011-01-30 ENCOUNTER — Emergency Department (HOSPITAL_COMMUNITY)
Admission: EM | Admit: 2011-01-30 | Discharge: 2011-01-30 | Disposition: A | Discharge status: Home / Self Care | Payer: Self-pay | Attending: Emergency Medicine | Admitting: Emergency Medicine

## 2011-01-30 DIAGNOSIS — H53149 Visual discomfort, unspecified: Secondary | ICD-10-CM | POA: Insufficient documentation

## 2011-01-30 DIAGNOSIS — Z79899 Other long term (current) drug therapy: Secondary | ICD-10-CM | POA: Insufficient documentation

## 2011-01-30 DIAGNOSIS — G43909 Migraine, unspecified, not intractable, without status migrainosus: Secondary | ICD-10-CM | POA: Insufficient documentation

## 2011-01-30 DIAGNOSIS — R1110 Vomiting, unspecified: Secondary | ICD-10-CM | POA: Insufficient documentation

## 2011-01-30 DIAGNOSIS — F329 Major depressive disorder, single episode, unspecified: Secondary | ICD-10-CM | POA: Insufficient documentation

## 2011-01-30 DIAGNOSIS — F3289 Other specified depressive episodes: Secondary | ICD-10-CM | POA: Insufficient documentation

## 2011-01-30 DIAGNOSIS — E119 Type 2 diabetes mellitus without complications: Secondary | ICD-10-CM | POA: Insufficient documentation

## 2011-01-30 DIAGNOSIS — R42 Dizziness and giddiness: Secondary | ICD-10-CM | POA: Insufficient documentation

## 2011-01-30 LAB — GLUCOSE, CAPILLARY: Glucose-Capillary: 128 mg/dL — ABNORMAL HIGH (ref 70–99)

## 2011-02-10 ENCOUNTER — Ambulatory Visit: Payer: Self-pay | Admitting: Advanced Practice Midwife

## 2011-02-16 ENCOUNTER — Telehealth: Payer: Self-pay | Admitting: Family Medicine

## 2011-02-16 NOTE — Telephone Encounter (Signed)
[facility], [facility], but [facility].

## 2011-02-28 ENCOUNTER — Encounter: Payer: Self-pay | Admitting: Family Medicine

## 2011-02-28 ENCOUNTER — Ambulatory Visit (INDEPENDENT_AMBULATORY_CARE_PROVIDER_SITE_OTHER): Payer: Self-pay | Admitting: Family Medicine

## 2011-02-28 VITALS — BP 115/75 | HR 102 | Temp 98.0°F | Wt 287.0 lb

## 2011-02-28 DIAGNOSIS — Z309 Encounter for contraceptive management, unspecified: Secondary | ICD-10-CM

## 2011-02-28 DIAGNOSIS — R197 Diarrhea, unspecified: Secondary | ICD-10-CM

## 2011-02-28 DIAGNOSIS — M25569 Pain in unspecified knee: Secondary | ICD-10-CM

## 2011-02-28 DIAGNOSIS — Z3009 Encounter for other general counseling and advice on contraception: Secondary | ICD-10-CM | POA: Insufficient documentation

## 2011-02-28 DIAGNOSIS — R109 Unspecified abdominal pain: Secondary | ICD-10-CM

## 2011-02-28 DIAGNOSIS — N912 Amenorrhea, unspecified: Secondary | ICD-10-CM

## 2011-02-28 MED ORDER — NORGESTIMATE-ETH ESTRADIOL 0.25-35 MG-MCG PO TABS: 1.0000 | ORAL_TABLET | Freq: Every day | ORAL | Status: DC

## 2011-02-28 NOTE — Assessment & Plan Note (Signed)
U preg negative.  Advised hormonal contraceptives can lessen bleeding.  Rx for sprintec.

## 2011-02-28 NOTE — Assessment & Plan Note (Signed)
See diarrhea. 
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
## 2011-02-28 NOTE — Assessment & Plan Note (Signed)
Concern for irritable bowel.  Advised of affects of stress, and asked to keep food journal to ID foods that are irritating.

## 2011-02-28 NOTE — Patient Instructions (Signed)
It was nice to meet you.  I want you to try taking Ibuprofen and Tylenol three times a day for a week as well as icing your knee every day after work.  You should take four over the counter Ibuprofen tablets (total 800 mg) with breakfast, lunch, and dinner.    You should take two extra strength tylenol tablets (total 1000 mg) in between meals and at bedtime.    After a week, go back to taking tylenol or ibuprofen as needed.    Please try to write down what foods give you stomach cramps and diarrhea, and avoid those foods.  Try to drink a lot of water and fiber.
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
## 2011-04-12 ENCOUNTER — Other Ambulatory Visit (HOSPITAL_COMMUNITY)
Admission: RE | Admit: 2011-04-12 | Discharge: 2011-04-12 | Disposition: A | Discharge status: Home / Self Care | Payer: Self-pay | Source: Ambulatory Visit | Attending: Family Medicine | Admitting: Family Medicine

## 2011-04-12 ENCOUNTER — Ambulatory Visit (INDEPENDENT_AMBULATORY_CARE_PROVIDER_SITE_OTHER): Payer: Self-pay | Admitting: Family Medicine

## 2011-04-12 ENCOUNTER — Encounter: Payer: Self-pay | Admitting: Family Medicine

## 2011-04-12 VITALS — BP 124/85 | HR 98 | Temp 97.5°F | Wt 293.0 lb

## 2011-04-12 DIAGNOSIS — M25569 Pain in unspecified knee: Secondary | ICD-10-CM

## 2011-04-12 DIAGNOSIS — Z124 Encounter for screening for malignant neoplasm of cervix: Secondary | ICD-10-CM

## 2011-04-12 DIAGNOSIS — L2089 Other atopic dermatitis: Secondary | ICD-10-CM

## 2011-04-12 DIAGNOSIS — F17200 Nicotine dependence, unspecified, uncomplicated: Secondary | ICD-10-CM

## 2011-04-12 DIAGNOSIS — R8789 Other abnormal findings in specimens from female genital organs: Secondary | ICD-10-CM

## 2011-04-12 DIAGNOSIS — E119 Type 2 diabetes mellitus without complications: Secondary | ICD-10-CM

## 2011-04-12 DIAGNOSIS — Z01419 Encounter for gynecological examination (general) (routine) without abnormal findings: Secondary | ICD-10-CM | POA: Insufficient documentation

## 2011-04-12 MED ORDER — CELECOXIB 100 MG PO CAPS: 100.0000 mg | ORAL_CAPSULE | Freq: Two times a day (BID) | ORAL | Status: AC

## 2011-04-12 MED ORDER — VARENICLINE TARTRATE 0.5 MG X 11 & 1 MG X 42 PO MISC: ORAL | Status: AC

## 2011-04-12 MED ORDER — VARENICLINE TARTRATE 1 MG PO TABS: 1.0000 mg | ORAL_TABLET | Freq: Two times a day (BID) | ORAL | Status: AC

## 2011-04-12 NOTE — Patient Instructions (Signed)
It was good to see you.  I will refer you to see the physical therapists for your knee pain.  Also, I want you to try celebrex for the pain.    I am glad you want to quit smoking again.  I have written you a prescription for Chantix.  Please let me know if you have any problems or mood changes on this medication.   I will send you a letter with your pap results.   You can use hydrocortisone cream on your itchy legs.

## 2011-04-18 ENCOUNTER — Encounter: Payer: Self-pay | Admitting: Family Medicine

## 2011-04-18 NOTE — Progress Notes (Signed)
  Subjective:    Patient ID: Krystal [name], female    DOB: 04/13/1986, 25 y.o.   MRN: [id number]  HPI  Krystal [name] presents for her well woman exam.  She is still having significant knee pain, that is only a little better with over the counter medications.  The pain is limiting her ability to exercise.  She is frustrated because she knows she needs to lose some weight.  Pt reports her blood sugars have been well controlled, no low sugars, tolerating glyburide well.   Also, pt reports that her last two periods have been very light, she wanted to make sure that was OK.  She has been taking OCP's for several months now.   Pt also complains that her legs itch at night time when she goes to bed, but she has not noticed a rash.   Review of Systems Negative except HPI.     Objective:   Physical Exam BP 124/85  Pulse 98  Temp(Src) 97.5 F (36.4 C) (Oral)  Wt 293 lb (132.904 kg)  LMP 03/29/2011 General appearance: alert, cooperative and no distress Eyes: conjunctivae/corneas clear. PERRL, EOM's intact. Fundi benign. Throat: lips, mucosa, and tongue normal; teeth and gums normal Neck: no adenopathy, supple, symmetrical, trachea midline and thyroid not enlarged, symmetric, no tenderness/mass/nodules Lungs: clear to auscultation bilaterally Breasts: normal appearance, no masses or tenderness Heart: regular rate and rhythm, S1, S2 normal, no murmur, click, rub or gallop Abdomen: soft, non-tender; bowel sounds normal; no masses,  no organomegaly Pelvic: cervix normal in appearance, external genitalia normal, no adnexal masses or tenderness, no cervical motion tenderness, rectovaginal septum normal, uterus normal size, shape, and consistency and vagina normal without discharge Extremities: extremities normal, atraumatic, no cyanosis or edema, R Knee with some lateral joint line tenderness to palpation, otherwise no abnormalities.  Pulses: 2+ and symmetric Skin: Skin color, texture, turgor  normal. No rashes or lesions       Assessment & Plan:

## 2011-04-18 NOTE — Assessment & Plan Note (Signed)
Persistent, will refer to PT for evaluation and treatment.  Rx Celebrex for the pain.

## 2011-04-18 NOTE — Assessment & Plan Note (Signed)
Well controlled, at goal, continue current medications.

## 2011-04-18 NOTE — Assessment & Plan Note (Signed)
Pap today to follow up prior abnormal one.  No abnormalities visualized on exam.

## 2011-04-18 NOTE — Assessment & Plan Note (Signed)
No rash seen on exam today, but pt c/o itching legs.  Advised hydrocortisone cream and moisturizer.

## 2011-04-18 NOTE — Assessment & Plan Note (Signed)
Pt wants to quit smoking, she has done it before with Chantix and would like to try again.  rx written today.

## 2011-05-03 ENCOUNTER — Ambulatory Visit: Payer: Self-pay | Attending: Family Medicine | Admitting: Physical Therapy

## 2011-05-03 DIAGNOSIS — IMO0001 Reserved for inherently not codable concepts without codable children: Secondary | ICD-10-CM | POA: Insufficient documentation

## 2011-05-03 DIAGNOSIS — R262 Difficulty in walking, not elsewhere classified: Secondary | ICD-10-CM | POA: Insufficient documentation

## 2011-05-03 DIAGNOSIS — M25569 Pain in unspecified knee: Secondary | ICD-10-CM | POA: Insufficient documentation

## 2011-05-03 DIAGNOSIS — M6281 Muscle weakness (generalized): Secondary | ICD-10-CM | POA: Insufficient documentation

## 2011-05-08 ENCOUNTER — Ambulatory Visit: Payer: Self-pay | Attending: Family Medicine | Admitting: Rehabilitation

## 2011-05-08 DIAGNOSIS — M6281 Muscle weakness (generalized): Secondary | ICD-10-CM | POA: Insufficient documentation

## 2011-05-08 DIAGNOSIS — M25569 Pain in unspecified knee: Secondary | ICD-10-CM | POA: Insufficient documentation

## 2011-05-08 DIAGNOSIS — IMO0001 Reserved for inherently not codable concepts without codable children: Secondary | ICD-10-CM | POA: Insufficient documentation

## 2011-05-08 DIAGNOSIS — R262 Difficulty in walking, not elsewhere classified: Secondary | ICD-10-CM | POA: Insufficient documentation

## 2011-05-11 ENCOUNTER — Ambulatory Visit: Payer: Self-pay | Admitting: Rehabilitation

## 2011-05-15 ENCOUNTER — Encounter: Payer: Self-pay | Admitting: Rehabilitation

## 2011-05-15 ENCOUNTER — Ambulatory Visit: Payer: Self-pay | Admitting: Rehabilitation

## 2011-05-17 ENCOUNTER — Ambulatory Visit: Payer: Self-pay | Admitting: Physical Therapy

## 2011-05-22 LAB — URINALYSIS, ROUTINE W REFLEX MICROSCOPIC
Bilirubin Urine: NEGATIVE
Hgb urine dipstick: NEGATIVE
Specific Gravity, Urine: 1.016
Urobilinogen, UA: 0.2

## 2011-05-22 LAB — POCT PREGNANCY, URINE: Operator id: 277751

## 2011-05-23 ENCOUNTER — Ambulatory Visit: Payer: Self-pay | Admitting: Physical Therapy

## 2011-05-26 ENCOUNTER — Ambulatory Visit: Payer: Self-pay | Admitting: Physical Therapy

## 2011-05-30 ENCOUNTER — Ambulatory Visit: Payer: Self-pay | Admitting: Physical Therapy

## 2011-06-02 ENCOUNTER — Ambulatory Visit: Payer: Self-pay | Admitting: Rehabilitation

## 2011-06-09 ENCOUNTER — Ambulatory Visit: Payer: Self-pay | Attending: Family Medicine | Admitting: Physical Therapy

## 2011-06-09 DIAGNOSIS — R262 Difficulty in walking, not elsewhere classified: Secondary | ICD-10-CM | POA: Insufficient documentation

## 2011-06-09 DIAGNOSIS — IMO0001 Reserved for inherently not codable concepts without codable children: Secondary | ICD-10-CM | POA: Insufficient documentation

## 2011-06-09 DIAGNOSIS — M25569 Pain in unspecified knee: Secondary | ICD-10-CM | POA: Insufficient documentation

## 2011-06-09 DIAGNOSIS — M6281 Muscle weakness (generalized): Secondary | ICD-10-CM | POA: Insufficient documentation

## 2011-06-14 ENCOUNTER — Ambulatory Visit: Payer: Self-pay | Admitting: Physical Therapy

## 2011-06-16 ENCOUNTER — Ambulatory Visit: Payer: Self-pay | Admitting: Rehabilitation

## 2011-06-21 ENCOUNTER — Ambulatory Visit: Payer: Self-pay | Admitting: Physical Therapy

## 2011-06-27 ENCOUNTER — Encounter: Payer: Self-pay | Admitting: Physical Therapy

## 2011-06-28 ENCOUNTER — Ambulatory Visit: Payer: Self-pay | Admitting: Physical Therapy

## 2011-07-04 ENCOUNTER — Ambulatory Visit: Payer: Self-pay | Admitting: Physical Therapy

## 2011-07-10 ENCOUNTER — Ambulatory Visit: Payer: Self-pay | Attending: Family Medicine | Admitting: Physical Therapy

## 2011-07-10 DIAGNOSIS — M6281 Muscle weakness (generalized): Secondary | ICD-10-CM | POA: Insufficient documentation

## 2011-07-10 DIAGNOSIS — R262 Difficulty in walking, not elsewhere classified: Secondary | ICD-10-CM | POA: Insufficient documentation

## 2011-07-10 DIAGNOSIS — M25569 Pain in unspecified knee: Secondary | ICD-10-CM | POA: Insufficient documentation

## 2011-07-10 DIAGNOSIS — IMO0001 Reserved for inherently not codable concepts without codable children: Secondary | ICD-10-CM | POA: Insufficient documentation

## 2011-07-12 ENCOUNTER — Encounter: Payer: Self-pay | Admitting: Physical Therapy

## 2011-07-13 ENCOUNTER — Ambulatory Visit: Payer: Self-pay | Admitting: Physical Therapy

## 2011-07-18 ENCOUNTER — Ambulatory Visit: Payer: Self-pay | Admitting: Physical Therapy

## 2011-07-21 ENCOUNTER — Ambulatory Visit: Payer: Self-pay | Admitting: Physical Therapy

## 2011-07-26 ENCOUNTER — Encounter: Payer: Self-pay | Admitting: Physical Therapy

## 2011-08-22 ENCOUNTER — Ambulatory Visit (INDEPENDENT_AMBULATORY_CARE_PROVIDER_SITE_OTHER): Payer: Self-pay | Admitting: Family Medicine

## 2011-08-22 ENCOUNTER — Encounter: Payer: Self-pay | Admitting: Family Medicine

## 2011-08-22 VITALS — BP 113/79 | HR 97 | Temp 97.1°F | Ht 65.5 in | Wt 303.0 lb

## 2011-08-22 DIAGNOSIS — N912 Amenorrhea, unspecified: Secondary | ICD-10-CM

## 2011-08-22 DIAGNOSIS — F329 Major depressive disorder, single episode, unspecified: Secondary | ICD-10-CM

## 2011-08-22 DIAGNOSIS — E119 Type 2 diabetes mellitus without complications: Secondary | ICD-10-CM

## 2011-08-22 DIAGNOSIS — Z309 Encounter for contraceptive management, unspecified: Secondary | ICD-10-CM

## 2011-08-22 LAB — POCT URINE PREGNANCY: Preg Test, Ur: NEGATIVE

## 2011-08-22 MED ORDER — METFORMIN HCL 1000 MG PO TABS: 500.0000 mg | ORAL_TABLET | Freq: Two times a day (BID) | ORAL | Status: DC

## 2011-08-22 MED ORDER — GLYBURIDE 2.5 MG PO TABS: ORAL_TABLET | ORAL | Status: DC

## 2011-08-22 MED ORDER — SERTRALINE HCL 50 MG PO TABS: 50.0000 mg | ORAL_TABLET | Freq: Every day | ORAL | Status: DC

## 2011-08-22 MED ORDER — NORGESTIMATE-ETH ESTRADIOL 0.25-35 MG-MCG PO TABS: 1.0000 | ORAL_TABLET | Freq: Every day | ORAL | Status: DC

## 2011-08-22 NOTE — Patient Instructions (Addendum)
It was good to see you today.  Your Hemoglobin A1C is  Lab Results  Component Value Date   HGBA1C 6.8 08/22/2011  .  Remember your goal for A1C is less than 7.  Your goal for fasting morning blood sugar is 80-120.   I have refilled your medications.    I am concerned that you are having difficulty with your mood, which is contributing to your upset stomach and difficulty sleeping.  I am going to start you on zoloft, remember this will take a month to start working.  Please stop the medication immediately if your mood worsens.

## 2011-08-23 NOTE — Assessment & Plan Note (Signed)
Well controlled, continue glyburide and metformin.

## 2011-08-23 NOTE — Assessment & Plan Note (Signed)
Re-start OCP's, reviewed risks with smoking and pills.  Pregnancy test negative in office today.

## 2011-08-23 NOTE — Assessment & Plan Note (Signed)
Will start zoloft for depression.  I feel her difficulty sleeping and irritable bowel symptoms are part of her depression.  I reviewed good sleep hygiene and gave hand out as well.

## 2011-08-23 NOTE — Progress Notes (Signed)
  Subjective:    Patient ID: Krystal [name], female    DOB: Dec 28, 1985, 26 y.o.   MRN: [id number]  HPI  [name] comes in for follow up of her diabetes.  She says she is doing well taking her mediations and her blood sugars have been good numbers.  No hypoglycemia.   She complains of some difficult sleeping at night, says she has difficult falling asleep, which is making doing her daily activities and taking care of her children very difficult.  She says her mood has not been good lately.  She has continued to have irritable bowel symptoms.  She has tried keeping a food diary, and has noticed some worsened symptoms with dairy products.  She does, however, notice that her symptoms are worse with stress or when she is emotionally labile.   She says she stopped taking her birth control pills because she ran out.  She has had sex since she ran out and did not use condoms.  She wants to re-start the birth control pills.   She has also had a cold and mild cough.  She says no fevers or chills, no chest pain, shortness of breath, or productive cough.    Review of Systems Pertinent items in HPI.     Objective:   Physical Exam BP 113/79  Pulse 97  Temp(Src) 97.1 F (36.2 C) (Oral)  Ht 5' 5.5" (1.664 m)  Wt 303 lb (137.44 kg)  BMI 49.66 kg/m2  LMP 08/08/2011 General appearance: alert, cooperative and no distress Eyes: conjunctivae/corneas clear. PERRL, EOM's intact. Fundi benign. Throat: lips, mucosa, and tongue normal; teeth and gums normal Lungs: clear to auscultation bilaterally Heart: regular rate and rhythm, S1, S2 normal, no murmur, click, rub or gallop Abdomen: soft, non-tender; bowel sounds normal; no masses,  no organomegaly Extremities: extremities normal, atraumatic, no cyanosis or edema Pulses: 2+ and symmetric Psych: Patient with normal thought content, judgement, but appears more anxious than last visit.   PHQ9= 11      Assessment & Plan:

## 2011-08-26 ENCOUNTER — Emergency Department (HOSPITAL_COMMUNITY)
Admission: EM | Admit: 2011-08-26 | Discharge: 2011-08-26 | Disposition: A | Discharge status: Home / Self Care | Payer: Self-pay | Attending: Emergency Medicine | Admitting: Emergency Medicine

## 2011-08-26 ENCOUNTER — Encounter (HOSPITAL_COMMUNITY): Payer: Self-pay

## 2011-08-26 DIAGNOSIS — H9201 Otalgia, right ear: Secondary | ICD-10-CM

## 2011-08-26 DIAGNOSIS — H60399 Other infective otitis externa, unspecified ear: Secondary | ICD-10-CM | POA: Insufficient documentation

## 2011-08-26 DIAGNOSIS — H6090 Unspecified otitis externa, unspecified ear: Secondary | ICD-10-CM

## 2011-08-26 DIAGNOSIS — E119 Type 2 diabetes mellitus without complications: Secondary | ICD-10-CM | POA: Insufficient documentation

## 2011-08-26 DIAGNOSIS — H9190 Unspecified hearing loss, unspecified ear: Secondary | ICD-10-CM | POA: Insufficient documentation

## 2011-08-26 DIAGNOSIS — F17200 Nicotine dependence, unspecified, uncomplicated: Secondary | ICD-10-CM | POA: Insufficient documentation

## 2011-08-26 DIAGNOSIS — R05 Cough: Secondary | ICD-10-CM | POA: Insufficient documentation

## 2011-08-26 DIAGNOSIS — R059 Cough, unspecified: Secondary | ICD-10-CM | POA: Insufficient documentation

## 2011-08-26 DIAGNOSIS — H9209 Otalgia, unspecified ear: Secondary | ICD-10-CM | POA: Insufficient documentation

## 2011-08-26 MED ORDER — ANTIPYRINE-BENZOCAINE 5.4-1.4 % OT SOLN: 3.0000 [drp] | OTIC | Status: AC | PRN

## 2011-08-26 MED ORDER — OFLOXACIN 0.3 % OT SOLN: 5.0000 [drp] | Freq: Two times a day (BID) | OTIC | Status: AC

## 2011-08-26 NOTE — ED Notes (Signed)
Pt. Went to Dr. Matilde Haymaker on Tuesday for her rt. Ear pain, and was told it was due to wax buildup. Pt. Reports the pain has increased and is also radiating into her rt. face

## 2011-08-26 NOTE — ED Provider Notes (Signed)
History     CSN: [id number]  Arrival date & time 08/26/11  7829   First MD Initiated Contact with Patient 08/26/11 [phone]      Chief Complaint  Patient presents with  . Otalgia    (Consider location/radiation/quality/duration/timing/severity/associated sxs/prior treatment) Patient is a 26 y.o. female presenting with ear pain. The history is provided by the patient.  Otalgia This is a new problem. The current episode started 2 days ago. There is pain in the right ear. The problem occurs constantly. The problem has been gradually worsening. There has been no fever. The pain is moderate. Associated symptoms include hearing loss and cough. Pertinent negatives include no ear discharge, no headaches, no rhinorrhea, no sore throat, no abdominal pain, no diarrhea, no vomiting, no neck pain and no rash. Her past medical history does not include chronic ear infection, hearing loss or tympanostomy tube.   No chest pain, SOB, abdominal pain, nausea, vomiting, or diarrhea.  She does unfortunately smoke.  Pt went to her pcp last week.  PCP cleaned out her right ear with a q-tip, and since that time she has had right ear pain radiating to her right anterior cervical region.  Past Medical History  Diagnosis Date  . Diabetes mellitus   . Depression bi-polar    History reviewed. No pertinent past surgical history.  No family history on file.  History  Substance Use Topics  . Smoking status: Current Everyday Smoker -- 0.5 packs/day for 6 years    Types: Cigarettes  . Smokeless tobacco: Never Used  . Alcohol Use: No    OB History    Grav Para Term Preterm Abortions TAB SAB Ect Mult Living                  Review of Systems  HENT: Positive for hearing loss and ear pain. Negative for sore throat, rhinorrhea, neck pain and ear discharge.   Respiratory: Positive for cough.   Gastrointestinal: Negative for vomiting, abdominal pain and diarrhea.  Skin: Negative for rash.  Neurological: Negative  for headaches.  All other systems reviewed and are negative.    Allergies  Review of patient's allergies indicates no known allergies.  Home Medications   Current Outpatient Rx  Name Route Sig Dispense Refill  . BLOOD GLUCOSE MONITOR KIT  check sugars once a day 1 each 0  . GLUCOSE BLOOD VI STRP  Use as instructed to check sugars once a day     . GLYBURIDE 2.5 MG PO TABS  1 tab in the morning and 3 tabs in the evening 120 tablet 6  . LANCETS MISC  for checking sugars once a day     . METFORMIN HCL 1000 MG PO TABS Oral Take 0.5 tablets (500 mg total) by mouth 2 (two) times daily with a meal. 60 tablet 6  . NORGESTIMATE-ETH ESTRADIOL 0.25-35 MG-MCG PO TABS Oral Take 1 tablet by mouth daily. 28 tablet 11  . SERTRALINE HCL 50 MG PO TABS Oral Take 1 tablet (50 mg total) by mouth daily. 30 tablet 5    BP 118/86  Pulse 88  Temp(Src) 97.8 F (36.6 C) (Oral)  Resp 20  Ht 5\' 4"  (1.626 m)  Wt 304 lb (137.893 kg)  BMI 52.18 kg/m2  SpO2 98%  LMP 08/08/2011  Physical Exam  Constitutional: She is oriented to person, place, and time. She appears well-developed and well-nourished.  HENT:  Head: Normocephalic and atraumatic.  Mouth/Throat: Oropharynx is clear and moist. No oropharyngeal exudate.  Rt ear canal erythematous, no erythema, drainage of tymphanic membrane.  No pain with palpation or pressure of right pinna.  Left TM normal, no abnormalities.  Eyes: Conjunctivae and EOM are normal. Pupils are equal, round, and reactive to light. Right eye exhibits no discharge. Left eye exhibits no discharge. No scleral icterus.  Neck: Normal range of motion. Neck supple.  Cardiovascular: Normal rate and regular rhythm.   Pulmonary/Chest: Effort normal and breath sounds normal.  Abdominal: Soft. Bowel sounds are normal.  Musculoskeletal: Normal range of motion.  Lymphadenopathy:    She has no cervical adenopathy.  Neurological: She is alert and oriented to person, place, and time.  Skin:  Skin is warm and dry.  Psychiatric: She has a normal mood and affect. Her behavior is normal.    ED Course  Procedures (including critical care time)  Labs Reviewed - No data to display No results found.   No diagnosis found.    MDM  I've examined and evaluated patient.  Patient has erythematous ear canal with pain with otoscope insertion.  Right TM is not bulging and no erythema.  Given auralgia for symptomatic relief and polymixin drops for treatment.  Advised if no improvement in 3-5 days or patient starts to develop a fever to follow up with primary care physician.  Pt voiced understanding.        Lindley Magnus Kistler, [place] 08/26/11 [phone]

## 2011-08-28 NOTE — ED Provider Notes (Signed)
Medical screening examination/treatment/procedure(s) were performed by non-physician practitioner and as supervising physician I was immediately available for consultation/collaboration.  Arvilla Salada T Jamere Stidham, MD 08/28/11 1539 

## 2011-09-01 ENCOUNTER — Encounter: Payer: Self-pay | Admitting: Family Medicine

## 2011-09-01 ENCOUNTER — Ambulatory Visit (INDEPENDENT_AMBULATORY_CARE_PROVIDER_SITE_OTHER): Payer: Self-pay | Admitting: Family Medicine

## 2011-09-01 VITALS — BP 115/83 | HR 110 | Temp 98.7°F | Ht 65.5 in | Wt 298.9 lb

## 2011-09-01 DIAGNOSIS — H9201 Otalgia, right ear: Secondary | ICD-10-CM

## 2011-09-01 DIAGNOSIS — H9209 Otalgia, unspecified ear: Secondary | ICD-10-CM

## 2011-09-01 MED ORDER — AMOXICILLIN-POT CLAVULANATE 875-125 MG PO TABS: 1.0000 | ORAL_TABLET | Freq: Two times a day (BID) | ORAL | Status: DC

## 2011-09-01 NOTE — Assessment & Plan Note (Signed)
At this point she has had failure of conservative measures for air pain. I feel that oral antibiotics are warranted for a ten-day trial of Augmentin.  If pain is not resolved at that point I recommend return to clinic in referral to [facility] for further evaluation. The differential includes trigeminal neuralgia or referred pain from maxillary or mandibular gum or tooth issues.   Discussed warning signs for infection and allergies to Augmentin. Patient expresses understanding will followup if not improved.

## 2011-09-01 NOTE — Patient Instructions (Signed)
Thank you for coming in today. Take antibiotics twice a day for 10 days. Use condoms while on antibiotics because it may make birth control less effective. If you still have ear pain see Dr. Lula Olszewski I think it makes sense for you to see a ear doctor at that point. Take care.

## 2011-09-01 NOTE — Progress Notes (Signed)
Krystal [name] is a 26 y.o. female who presents to [facility] today for continued right ear pain.  She has had right ear pain since January 4th.  This was initially seen by Dr. Lula Olszewski who evacuated obstructing cerumen.  She noted continued pain and was seen by the emergency room on 19 January who provided topical ciprofloxacin drops and Auralgan drops.  She notes continued pain in her right ear that radiates to her right jaw and maxilla.  She denies a letter cold shooting sensation of pain or significant nasal discharge fever or chills.  Otherwise she feels well.   PMH reviewed.  ROS as above otherwise neg Medications reviewed. Current Outpatient Prescriptions  Medication Sig Dispense Refill  . amoxicillin-clavulanate (AUGMENTIN) 875-125 MG per tablet Take 1 tablet by mouth 2 (two) times daily.  20 tablet  0  . antipyrine-benzocaine (AURALGAN) otic solution Place 3 drops into the right ear every 2 (two) hours as needed for pain.  10 mL  0  . glyBURIDE (DIABETA) 2.5 MG tablet 1 tab in the morning and 3 tabs in the evening  120 tablet  6  . ibuprofen (ADVIL,MOTRIN) 200 MG tablet Take 800 mg by mouth every 6 (six) hours as needed.      . metFORMIN (GLUCOPHAGE) 1000 MG tablet Take 0.5 tablets (500 mg total) by mouth 2 (two) times daily with a meal.  60 tablet  6  . norgestimate-ethinyl estradiol (ORTHO-CYCLEN,SPRINTEC,PREVIFEM) 0.25-35 MG-MCG tablet Take 1 tablet by mouth daily.  28 tablet  11  . ofloxacin (FLOXIN) 0.3 % otic solution Place 5 drops into the right ear 2 (two) times daily.  10 mL  0  . sertraline (ZOLOFT) 50 MG tablet Take 1 tablet (50 mg total) by mouth daily.  30 tablet  5    Exam:  BP 115/83  Pulse 110  Temp(Src) 98.7 F (37.1 C) (Oral)  Ht 5' 5.5" (1.664 m)  Wt 298 lb 14.4 oz (135.58 kg)  BMI 48.98 kg/m2  LMP 08/08/2011 Gen: Well NAD HEENT: EOMI,  MMM.  Tympanic membranes bilaterally are retracted with decreased translucence of the tympanic membranes. No erythema of the  canal or membrane itself.   Face is nontender to palpation no nasal discharge no posterior pharyngeal erythema or exudate normal teeth bilaterally.

## 2011-09-04 ENCOUNTER — Encounter (HOSPITAL_COMMUNITY): Payer: Self-pay | Admitting: Emergency Medicine

## 2011-09-04 ENCOUNTER — Emergency Department (HOSPITAL_COMMUNITY)
Admission: EM | Admit: 2011-09-04 | Discharge: 2011-09-04 | Disposition: A | Discharge status: Home / Self Care | Payer: Self-pay | Attending: Emergency Medicine | Admitting: Emergency Medicine

## 2011-09-04 DIAGNOSIS — E119 Type 2 diabetes mellitus without complications: Secondary | ICD-10-CM | POA: Insufficient documentation

## 2011-09-04 DIAGNOSIS — F329 Major depressive disorder, single episode, unspecified: Secondary | ICD-10-CM | POA: Insufficient documentation

## 2011-09-04 DIAGNOSIS — Z79899 Other long term (current) drug therapy: Secondary | ICD-10-CM | POA: Insufficient documentation

## 2011-09-04 DIAGNOSIS — F3289 Other specified depressive episodes: Secondary | ICD-10-CM | POA: Insufficient documentation

## 2011-09-04 DIAGNOSIS — M542 Cervicalgia: Secondary | ICD-10-CM | POA: Insufficient documentation

## 2011-09-04 DIAGNOSIS — R51 Headache: Secondary | ICD-10-CM | POA: Insufficient documentation

## 2011-09-04 DIAGNOSIS — H9209 Otalgia, unspecified ear: Secondary | ICD-10-CM | POA: Insufficient documentation

## 2011-09-04 HISTORY — DX: Obesity, unspecified: E66.9

## 2011-09-04 MED ORDER — TRAMADOL HCL 50 MG PO TABS: 50.0000 mg | ORAL_TABLET | Freq: Four times a day (QID) | ORAL | Status: DC | PRN
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
## 2011-09-09 ENCOUNTER — Encounter (HOSPITAL_COMMUNITY): Payer: Self-pay | Admitting: Emergency Medicine

## 2011-09-09 ENCOUNTER — Emergency Department (HOSPITAL_COMMUNITY)
Admission: EM | Admit: 2011-09-09 | Discharge: 2011-09-09 | Disposition: A | Discharge status: Home / Self Care | Payer: Self-pay | Attending: Emergency Medicine | Admitting: Emergency Medicine

## 2011-09-09 DIAGNOSIS — E119 Type 2 diabetes mellitus without complications: Secondary | ICD-10-CM | POA: Insufficient documentation

## 2011-09-09 DIAGNOSIS — K0889 Other specified disorders of teeth and supporting structures: Secondary | ICD-10-CM

## 2011-09-09 DIAGNOSIS — F17200 Nicotine dependence, unspecified, uncomplicated: Secondary | ICD-10-CM | POA: Insufficient documentation

## 2011-09-09 DIAGNOSIS — K089 Disorder of teeth and supporting structures, unspecified: Secondary | ICD-10-CM | POA: Insufficient documentation

## 2011-09-09 DIAGNOSIS — E669 Obesity, unspecified: Secondary | ICD-10-CM | POA: Insufficient documentation

## 2011-09-09 DIAGNOSIS — F319 Bipolar disorder, unspecified: Secondary | ICD-10-CM | POA: Insufficient documentation

## 2011-09-09 MED ORDER — OXYCODONE-ACETAMINOPHEN 5-325 MG PO TABS
1.0000 | ORAL_TABLET | Freq: Once | ORAL | Status: AC
  Administered 2011-09-09: 1 via ORAL
  Filled 2011-09-09: qty 1

## 2011-09-09 MED ORDER — OXYCODONE-ACETAMINOPHEN 5-325 MG PO TABS: 1.0000 | ORAL_TABLET | Freq: Four times a day (QID) | ORAL | Status: DC | PRN

## 2011-09-09 MED ORDER — KETOROLAC TROMETHAMINE 60 MG/2ML IM SOLN
60.0000 mg | Freq: Once | INTRAMUSCULAR | Status: AC
  Administered 2011-09-09: 60 mg via INTRAMUSCULAR
  Filled 2011-09-09: qty 2

## 2011-09-09 NOTE — ED Provider Notes (Signed)
History     CSN: [id number]  Arrival date & time 09/09/11  2134   First MD Initiated Contact with Patient 09/09/11 2204      Chief Complaint  Patient presents with  . Dental Pain    (Consider location/radiation/quality/duration/timing/severity/associated sxs/prior treatment) HPI Patient states she has had dental pain on the right side for the last week.  She states she has been seen here several times along with visit her dentist.  She states that her dentist referred her to an oral surgeon as there can be some wisdom tooth issues that are causing her pain.  Her pain radiates from her back  dentition in the area of her wisdom teeth.  Patient has no difficulty swallowing or breathing or swelling under tongue. patient denies nausea/vomiting, weakness, fever, shortness of breath, chest pain, or headache. Past Medical History  Diagnosis Date  . Diabetes mellitus   . Depression bi-polar  . Obesity     History reviewed. No pertinent past surgical history.  No family history on file.  History  Substance Use Topics  . Smoking status: Current Everyday Smoker -- 0.5 packs/day for 6 years    Types: Cigarettes  . Smokeless tobacco: Never Used  . Alcohol Use: No    OB History    Grav Para Term Preterm Abortions TAB SAB Ect Mult Living                  Review of Systems All pertinent positives and negatives reviewed in the history of present illness  Allergies  Review of patient's allergies indicates no known allergies.  Home Medications   Current Outpatient Rx  Name Route Sig Dispense Refill  . GLYBURIDE 2.5 MG PO TABS  1 tab in the morning and 3 tabs in the evening 120 tablet 6  . IBUPROFEN 200 MG PO TABS Oral Take 800 mg by mouth every 6 (six) hours as needed. For pain    . IBUPROFEN 800 MG PO TABS Oral Take 800 mg by mouth every 8 (eight) hours as needed. For pain    . METFORMIN HCL 1000 MG PO TABS Oral Take 0.5 tablets (500 mg total) by mouth 2 (two) times daily with a  meal. 60 tablet 6  . NORGESTIMATE-ETH ESTRADIOL 0.25-35 MG-MCG PO TABS Oral Take 1 tablet by mouth daily. 28 tablet 11  . SERTRALINE HCL 50 MG PO TABS Oral Take 1 tablet (50 mg total) by mouth daily. 30 tablet 5  . TRAMADOL HCL 50 MG PO TABS Oral Take 100 mg by mouth every 6 (six) hours as needed. For pain      BP 104/67  Pulse 86  Temp(Src) 98.1 F (36.7 C) (Oral)  Resp 20  SpO2 99%  LMP 09/03/2011  Physical Exam  Constitutional: She appears well-developed and well-nourished.  HENT:  Head: Normocephalic and atraumatic. No trismus in the jaw.  Right Ear: Tympanic membrane normal.  Left Ear: Tympanic membrane normal.  Mouth/Throat: Uvula is midline, oropharynx is clear and moist and mucous membranes are normal. No oral lesions. Normal dentition. No dental abscesses or uvula swelling. No oropharyngeal exudate.       Patient does not have any dental abscesses or swelling under her tongue or her posterior oropharynx.  The patient has wisdom teeth that are not completely through the gumline.  This could be causing her pain  Eyes: Pupils are equal, round, and reactive to light.  Neck: Neck supple. No tracheal deviation present. No thyromegaly present.  Cardiovascular: Normal  rate, regular rhythm and normal heart sounds.   Pulmonary/Chest: Effort normal and breath sounds normal. No stridor.  Lymphadenopathy:    She has no cervical adenopathy.    ED Course  Procedures (including critical care time)  Labs Reviewed - No data to display No results found.   The patient has not oral surgery appointment on Monday.  I feel that her wisdom teeth may be the culprit for pain based on the appearance of the eruption of these teeth through the part of the gumline.  Patient will be given pain control for home and advised to return here for any worsening in her condition.  She is advised she will need to see the oral surgeon as scheduled.  There is no signs of abscess or compromise of her airway or  swelling under her tongue.  Reviewed her previous visits here in the emergency department.   MDM  See above comments.        Carlyle Dolly, PA-C 09/09/11 2317

## 2011-09-09 NOTE — ED Notes (Signed)
Patient states approx 3 weeks ago started having pain in ear and left jaw saw PCP and tried ear drops with no relief. Pain in jaw continued and last week went to the dentist and was told there was nothing wrong with her teeth but instructed to follow up with an oral surgeon. Patient rates pain 10/10 and states hurts worse when lying down.

## 2011-09-09 NOTE — ED Notes (Signed)
Patient with toothpain on right side of face, bottom of ear to around to front of face.

## 2011-09-10 NOTE — ED Provider Notes (Signed)
Medical screening examination/treatment/procedure(s) were performed by non-physician practitioner and as supervising physician I was immediately available for consultation/collaboration.   Dain Laseter A. Kimm Sider, MD 09/10/11 1101 

## 2011-09-14 ENCOUNTER — Ambulatory Visit (INDEPENDENT_AMBULATORY_CARE_PROVIDER_SITE_OTHER): Payer: Self-pay | Admitting: Family Medicine

## 2011-09-14 ENCOUNTER — Encounter: Payer: Self-pay | Admitting: Family Medicine

## 2011-09-14 VITALS — BP 140/85 | HR 78 | Temp 97.7°F | Ht 65.5 in | Wt 297.7 lb

## 2011-09-14 DIAGNOSIS — R51 Headache: Secondary | ICD-10-CM

## 2011-09-14 MED ORDER — OXYCODONE-ACETAMINOPHEN 10-325 MG PO TABS: 1.0000 | ORAL_TABLET | ORAL | Status: DC | PRN

## 2011-09-14 MED ORDER — IBUPROFEN 800 MG PO TABS: 800.0000 mg | ORAL_TABLET | Freq: Three times a day (TID) | ORAL | Status: DC | PRN

## 2011-09-14 NOTE — Patient Instructions (Signed)
Come back if you aren't feeling better by Monday or Tuesday.

## 2011-09-15 ENCOUNTER — Ambulatory Visit (HOSPITAL_COMMUNITY)
Admission: RE | Admit: 2011-09-15 | Discharge: 2011-09-15 | Disposition: A | Discharge status: Home / Self Care | Payer: Self-pay | Source: Ambulatory Visit | Attending: Family Medicine | Admitting: Family Medicine

## 2011-09-15 ENCOUNTER — Other Ambulatory Visit: Payer: Self-pay | Admitting: Family Medicine

## 2011-09-15 ENCOUNTER — Telehealth: Payer: Self-pay | Admitting: Family Medicine

## 2011-09-15 DIAGNOSIS — R6884 Jaw pain: Secondary | ICD-10-CM | POA: Insufficient documentation

## 2011-09-15 NOTE — Progress Notes (Signed)
Subjective: The patient is a 26 y.o. year old female who presents today for jaw and facial pain.  Patient reports that she has been having problems with facial pain for the last month.  Was initially diagnosed and treated for an ear infection on the right.  Has continued to have pain.  Pain comes in waves, nothing seems to make significantly better or worse, and she has run out of pain medications.  She has been seen 3 times in the ER and once in our office.  She has also seen her dentist who has made her an appointment with a oral surgeon to remove a tooth that is thought to possibly be causing her problems.  This is to be done on 09/16/11.  She has also been diagnosed with a cyst on the roof of her mouth that will need to be removed.  This is also to be done later this month.  She does not report any fevers, chills, drainage, problems swallowing, and no hearing/visual changes.  Objective:  Filed Vitals:   09/14/11 1549  BP: 140/85  Pulse: 78  Temp: 97.7 F (36.5 C)   Gen: In pain, intermittently crying, obese HEENT:  Normal TM bilaterally, no erythema.  No significant facial tenderness.  No swelling.  No obvious signs of swelling or infection within the oral cavity.  No cervical adenopathy.  No pain on palpation of TMJ, no pops or clicks.  Assessment/Plan: Facial pain, likely dental in origin.  Will obtain x-ray of TMJ to make certain this is not the etiology and provide more oral pain meds.  Will hope that dental procedure later this week will be helpful.  If not, will likely need additional imaging of head/mouth.  Please also see individual problems in problem list for problem-specific plans.

## 2011-09-15 NOTE — Telephone Encounter (Signed)
Informed pt of normal result of x-ray.

## 2011-09-25 ENCOUNTER — Other Ambulatory Visit: Payer: Self-pay | Admitting: Family Medicine

## 2011-10-26 ENCOUNTER — Ambulatory Visit (INDEPENDENT_AMBULATORY_CARE_PROVIDER_SITE_OTHER): Payer: Self-pay | Admitting: Family Medicine

## 2011-10-26 ENCOUNTER — Encounter: Payer: Self-pay | Admitting: Family Medicine

## 2011-10-26 VITALS — BP 122/83 | HR 116 | Temp 97.9°F | Ht 65.5 in | Wt 294.0 lb

## 2011-10-26 DIAGNOSIS — L2089 Other atopic dermatitis: Secondary | ICD-10-CM

## 2011-10-26 DIAGNOSIS — F39 Unspecified mood [affective] disorder: Secondary | ICD-10-CM

## 2011-10-26 DIAGNOSIS — E119 Type 2 diabetes mellitus without complications: Secondary | ICD-10-CM

## 2011-10-26 MED ORDER — ACCU-CHEK SOFTCLIX LANCET DEV MISC: Status: DC

## 2011-10-26 MED ORDER — ACCU-CHEK NANO SMARTVIEW W/DEVICE KIT: 1.0000 | PACK | Freq: Once | Status: DC

## 2011-10-26 MED ORDER — SERTRALINE HCL 100 MG PO TABS: 100.0000 mg | ORAL_TABLET | Freq: Every day | ORAL | Status: DC

## 2011-10-26 MED ORDER — GLUCOSE BLOOD VI STRP: ORAL_STRIP | Status: DC

## 2011-10-26 NOTE — Patient Instructions (Signed)
It was good to see you.  I am glad your mood has improved on the zoloft.  I am going to increase it since you are on a low dose and you are still having difficulty sleeping.    I have sent prescriptions for a new glucose meter to your pharmacy- please show them this coupon too, the meter should be free.

## 2011-10-26 NOTE — Assessment & Plan Note (Signed)
Cause of itching legs.  Discussed eczema care and advised topical steroids (she already has several tubes at home).

## 2011-10-26 NOTE — Progress Notes (Signed)
  Subjective:    Patient ID: Krystal [name], female    DOB: 1985-11-08, 26 y.o.   MRN: [id number]  HPI  Krystal [name] comes in for follow up.   Depression- she is taking zoloft and feels like her mood is better, her mom and husband say she is less irritable, and she says she has been crying less.  However, she says she is still having difficulty sleeping at night.  It is not every night and has gotten a little better.    DM II- her meter broke and she has not been checking her blood sugars.  She is pretty sure they have been good though because she feels bad when they are too high.  No polyuria or polydipsia, no hypoglycemia (nausea, shaking).   Legs itching- has not tried anything on them, takes a benadryl every now and then.  No rash, but sometimes she scratches so much she make smarks.    Review of Systems Pertinent items in HPI.     Objective:   Physical Exam  Constitutional: She appears well-developed and well-nourished. No distress.  HENT:  Head: Normocephalic.  Eyes: EOM are normal. Pupils are equal, round, and reactive to light.  Neck: Normal range of motion.  Cardiovascular: Normal rate, regular rhythm and normal heart sounds.   Pulmonary/Chest: Effort normal and breath sounds normal. No respiratory distress.  Abdominal: Soft. Bowel sounds are normal. She exhibits no distension.  Skin: Skin is warm and dry.       Eczematous dry skin on LE.   Psychiatric: She has a normal mood and affect. Her behavior is normal. Judgment and thought content normal.          Assessment & Plan:

## 2011-10-26 NOTE — Assessment & Plan Note (Signed)
Improved but patient still having some symptoms, will increase Zoloft to 100 po at bedtime.

## 2011-10-26 NOTE — Assessment & Plan Note (Addendum)
Was previously well controlled, gave pt coupon for free glucometer, write scripts for kit and lancets and strips.

## 2011-12-12 ENCOUNTER — Other Ambulatory Visit: Payer: Self-pay | Admitting: Family Medicine

## 2011-12-22 ENCOUNTER — Other Ambulatory Visit: Payer: Self-pay | Admitting: Family Medicine

## 2012-05-13 ENCOUNTER — Other Ambulatory Visit: Payer: Self-pay | Admitting: Family Medicine

## 2012-07-22 ENCOUNTER — Ambulatory Visit (INDEPENDENT_AMBULATORY_CARE_PROVIDER_SITE_OTHER): Payer: Medicaid Other | Admitting: Family Medicine

## 2012-07-22 ENCOUNTER — Encounter: Payer: Self-pay | Admitting: Family Medicine

## 2012-07-22 VITALS — BP 130/84 | HR 112 | Ht 63.0 in | Wt 276.0 lb

## 2012-07-22 DIAGNOSIS — E119 Type 2 diabetes mellitus without complications: Secondary | ICD-10-CM

## 2012-07-22 DIAGNOSIS — E669 Obesity, unspecified: Secondary | ICD-10-CM

## 2012-07-22 DIAGNOSIS — Z3009 Encounter for other general counseling and advice on contraception: Secondary | ICD-10-CM

## 2012-07-22 DIAGNOSIS — F17200 Nicotine dependence, unspecified, uncomplicated: Secondary | ICD-10-CM

## 2012-07-22 DIAGNOSIS — N912 Amenorrhea, unspecified: Secondary | ICD-10-CM

## 2012-07-22 LAB — POCT GLYCOSYLATED HEMOGLOBIN (HGB A1C): Hemoglobin A1C: 10.1

## 2012-07-22 LAB — LIPID PANEL
Cholesterol: 108 mg/dL (ref 0–200)
Triglycerides: 73 mg/dL (ref <150)

## 2012-07-22 LAB — POCT URINE PREGNANCY: Preg Test, Ur: NEGATIVE

## 2012-07-22 MED ORDER — GLYBURIDE 2.5 MG PO TABS: ORAL_TABLET | ORAL | Status: DC

## 2012-07-22 MED ORDER — GLUCOSE BLOOD VI STRP: ORAL_STRIP | Status: DC

## 2012-07-22 MED ORDER — NORGESTIMATE-ETH ESTRADIOL 0.25-35 MG-MCG PO TABS: 1.0000 | ORAL_TABLET | Freq: Every day | ORAL | Status: DC

## 2012-07-22 MED ORDER — METFORMIN HCL 1000 MG PO TABS: 500.0000 mg | ORAL_TABLET | Freq: Two times a day (BID) | ORAL | Status: DC

## 2012-07-22 MED ORDER — ACCU-CHEK NANO SMARTVIEW W/DEVICE KIT: 1.0000 | PACK | Freq: Once | Status: DC

## 2012-07-22 MED ORDER — ACCU-CHEK SOFTCLIX LANCET DEV MISC: Status: DC

## 2012-07-22 NOTE — Patient Instructions (Signed)
It was good to see you today.  Your Hemoglobin A1C is  Lab Results  Component Value Date   HGBA1C 10.1 07/22/2012  .  Remember your goal for A1C is less than 7.  Your goal for fasting morning blood sugar is 80-120.   Please come back and see me in 3 months for a diabetes check, but if your fasting blood sugar is frequently above 150, please call the office for a visit sooner.   I will send you a letter with your lab results, or call you if anything is abnormal.

## 2012-07-22 NOTE — Progress Notes (Signed)
  Subjective:    Patient ID: Krystal [name], female    DOB: 1986-04-21, 26 y.o.   MRN: [id number]  HPI  [name] comes in for follow up.  She has run out of her medications and has not been taking anything in about one month.   DM: Patient is not taking glipizide of metformin because she ran out.  Patient is not checking blood sugars because her son threw her glucometer in the toilet and it does not work anymore. No hyper or hypoglycemic episodes, no polyuria or polydypisa.  She does endorse some tingling in her feet.   Contraception: patient was taking sprintec, but has run out.  She takes it with her other medications.  LMP was 3 weeks ago.  She likes the pills because they make her periods regular.   She is a smoker but has cut back, to 3-4 cigarettes/day. She understands the increased risk of blood clots with smoking on OCP's, she wants to quit.   Obesity: Trying to watch her diet, gets minimal exercise.  Asks if OTC diet supplemets help.  She has a family history of HLD, but has not had her cholesterol checked.   I have reviewed the patient's medical history in detail and updated the computerized patient record.  Review of Systems Pertinent items in HPI    Objective:   Physical Exam BP 130/84  Pulse 112  Ht 5\' 3"  (1.6 m)  Wt 276 lb (125.193 kg)  BMI 48.89 kg/m2 General appearance: alert, cooperative and no distress Neck: no adenopathy, supple, symmetrical, trachea midline and thyroid not enlarged, symmetric, no tenderness/mass/nodules Lungs: clear to auscultation bilaterally Heart: regular rate and rhythm, S1, S2 normal, no murmur, click, rub or gallop Extremities: extremities normal, atraumatic, no cyanosis or edema Diabetic foot Exam: Normal sensation to microfilament throughout feet.        Assessment & Plan:

## 2012-07-22 NOTE — Assessment & Plan Note (Signed)
Has lost a small amount of weight, but BMI still >48.  Will check lipids.  Offered referral for nutrition vs. Nutrition visits with me, patient will think about it.  Encouraged exercise.

## 2012-07-22 NOTE — Assessment & Plan Note (Signed)
Counseled to quit, patient declines assistance at this time, she thinks she can quit totally in the next few months.

## 2012-07-22 NOTE — Assessment & Plan Note (Signed)
A1C elevated today, will re-start medications at prior dose.  Rx for new glucometer and supplies.  Patient to check fasting sugars, call for appointment if they are >150, f/u in 3 months if good control.

## 2012-07-22 NOTE — Assessment & Plan Note (Signed)
U preg negative.  Re-start OCP's, advised to call office if she is going to run out so she does not have gaps in her medications.

## 2012-07-23 ENCOUNTER — Encounter: Payer: Self-pay | Admitting: Family Medicine

## 2012-08-08 ENCOUNTER — Ambulatory Visit (INDEPENDENT_AMBULATORY_CARE_PROVIDER_SITE_OTHER): Payer: Medicaid Other | Admitting: Family Medicine

## 2012-08-08 ENCOUNTER — Encounter: Payer: Self-pay | Admitting: Family Medicine

## 2012-08-08 VITALS — BP 110/72 | HR 72 | Temp 98.3°F | Ht 63.0 in | Wt 271.2 lb

## 2012-08-08 DIAGNOSIS — J019 Acute sinusitis, unspecified: Secondary | ICD-10-CM

## 2012-08-08 MED ORDER — BENZONATATE 100 MG PO CAPS: 100.0000 mg | ORAL_CAPSULE | Freq: Two times a day (BID) | ORAL | Status: DC | PRN

## 2012-08-08 MED ORDER — FLUTICASONE PROPIONATE 50 MCG/ACT NA SUSP: 2.0000 | Freq: Every day | NASAL | Status: DC

## 2012-08-08 MED ORDER — KETOROLAC TROMETHAMINE 60 MG/2ML IM SOLN
60.0000 mg | Freq: Once | INTRAMUSCULAR | Status: AC
  Administered 2012-08-08: 60 mg via INTRAMUSCULAR

## 2012-08-08 NOTE — Addendum Note (Signed)
Addended by: Deno Etienne on: 08/08/2012 10:27 AM   Modules accepted: Orders

## 2012-08-08 NOTE — Assessment & Plan Note (Signed)
Likely viral etiology - symptoms have been going on 3-4 days.  Will treat conservatively with Flonase, Tessalon perles, and Tylenol. Advised patient to drink fluids and get plenty of rest. If no clinical improvement after 10 days, advised patient to call me and I will consider sending Amoxicillin to pharmacy. Red flags reviewed and handout given.

## 2012-08-08 NOTE — Progress Notes (Signed)
  Subjective:    Patient ID: Krystal [name], female    DOB: 1986-07-25, 27 y.o.   MRN: [id number]  HPI  Patient presents to same day clinic for sinus pressure, nasal congestion started 3-4 days ago.  Started out with a headache, but now complains of rib pain with coughing and myalgias.  Rib pain worse after coughing spells.  Subjective fevers at home - did not take temperature.  + Nausea, diarrhea, and diffuse abdominal pain.  No vomiting.  Decreased appetite, but trying to drink water.  Has tried Tylenol cold and flu, Advil, Dayquil with some relief.  Last dose of analgesic was last night.  Review of Systems  Per HPI    Objective:   Physical Exam  Constitutional: She appears well-nourished. No distress.  HENT:  Head: Normocephalic and atraumatic.  Nose: Rhinorrhea present.  Mouth/Throat: Oropharynx is clear and moist.       RT TM: blocked by cerumen      LT TM: normal  Cardiovascular: Normal rate and regular rhythm.   Pulmonary/Chest: Effort normal and breath sounds normal. She has no wheezes. She has no rales.  Abdominal: Soft. She exhibits no distension. There is no tenderness. There is no rebound and no guarding.      Assessment & Plan:

## 2012-08-08 NOTE — Patient Instructions (Signed)
It was good to see you today, Dorla.  I am sorry you are not feeling well. Please pick up prescriptions at your pharmacy. For cough and sore throat, please purchase CEPACOL cough drops. Please refer to instructions below regarding sinusitis. If you do not feel better in 3-4 days, please call your doctor. If you develop worsening symptoms, fever (temp > 101 degrees), persistent cough, or nausea/vomiting, please return to clinic sooner. Hope you feel better soon.  Sinusitis Sinusitis is redness, soreness, and swelling (inflammation) of the paranasal sinuses. Paranasal sinuses are air pockets within the bones of your face (beneath the eyes, the middle of the forehead, or above the eyes). In healthy paranasal sinuses, mucus is able to drain out, and air is able to circulate through them by way of your nose. However, when your paranasal sinuses are inflamed, mucus and air can become trapped. This can allow bacteria and other germs to grow and cause infection. Sinusitis can develop quickly and last only a short time (acute) or continue over a long period (chronic). Sinusitis that lasts for more than 12 weeks is considered chronic.  CAUSES  Causes of sinusitis include:  Allergies.  Structural abnormalities, such as displacement of the cartilage that separates your nostrils (deviated septum), which can decrease the air flow through your nose and sinuses and affect sinus drainage.  Functional abnormalities, such as when the small hairs (cilia) that line your sinuses and help remove mucus do not work properly or are not present. SYMPTOMS  Symptoms of acute and chronic sinusitis are the same. The primary symptoms are pain and pressure around the affected sinuses. Other symptoms include:  Upper toothache.  Earache.  Headache.  Bad breath.  Decreased sense of smell and taste.  A cough, which worsens when you are lying flat.  Fatigue.  Fever.  Thick drainage from your nose, which often is  green and may contain pus (purulent).  Swelling and warmth over the affected sinuses. DIAGNOSIS  Your caregiver will perform a physical exam. During the exam, your caregiver may:  Look in your nose for signs of abnormal growths in your nostrils (nasal polyps).  Tap over the affected sinus to check for signs of infection.  View the inside of your sinuses (endoscopy) with a special imaging device with a light attached (endoscope), which is inserted into your sinuses. If your caregiver suspects that you have chronic sinusitis, one or more of the following tests may be recommended:  Allergy tests.  Nasal culture A sample of mucus is taken from your nose and sent to a lab and screened for bacteria.  Nasal cytology A sample of mucus is taken from your nose and examined by your caregiver to determine if your sinusitis is related to an allergy. TREATMENT  Most cases of acute sinusitis are related to a viral infection and will resolve on their own within 10 days. Sometimes medicines are prescribed to help relieve symptoms (pain medicine, decongestants, nasal steroid sprays, or saline sprays).  However, for sinusitis related to a bacterial infection, your caregiver will prescribe antibiotic medicines. These are medicines that will help kill the bacteria causing the infection.  Rarely, sinusitis is caused by a fungal infection. In theses cases, your caregiver will prescribe antifungal medicine. For some cases of chronic sinusitis, surgery is needed. Generally, these are cases in which sinusitis recurs more than 3 times per year, despite other treatments. HOME CARE INSTRUCTIONS   Drink plenty of water. Water helps thin the mucus so your sinuses can  drain more easily.  Use a humidifier.  Inhale steam 3 to 4 times a day (for example, sit in the bathroom with the shower running).  Apply a warm, moist washcloth to your face 3 to 4 times a day, or as directed by your caregiver.  Use saline nasal  sprays to help moisten and clean your sinuses.  Take over-the-counter or prescription medicines for pain, discomfort, or fever only as directed by your caregiver. SEEK IMMEDIATE MEDICAL CARE IF:  You have increasing pain or severe headaches.  You have nausea, vomiting, or drowsiness.  You have swelling around your face.  You have vision problems.  You have a stiff neck.  You have difficulty breathing. MAKE SURE YOU:   Understand these instructions.  Will watch your condition.  Will get help right away if you are not doing well or get worse. Document Released: 07/24/2005 Document Revised: 10/16/2011 Document Reviewed: 08/08/2011 [facility] Patient Information 2013 [place], [place].

## 2012-08-14 ENCOUNTER — Telehealth: Payer: Self-pay | Admitting: Family Medicine

## 2012-08-14 NOTE — Telephone Encounter (Signed)
Pt has been on her period for 2 weeks and this is very unusual for her - she is on BCP - wants to talk to nurse

## 2012-08-14 NOTE — Telephone Encounter (Signed)
Returned call to patient.  Has been on period since 08/03/12.  Heavy and normal bleeding, but has not stopped.  Denies any abdominal cramping.  Started BCP on 07/22/12 and was not on period.  On last week of pills now and due to start new pack on Monday (08/19/12).  Patient is taking pills at same time daily, has not missed any pills, and has not had intercourse since starting pills.  Patient informed to continue pills and call back if bleeding has not resolved one week after starting new pack.  Patient verbalized understanding.  Gaylene Brooks, RN

## 2012-08-30 ENCOUNTER — Other Ambulatory Visit (HOSPITAL_COMMUNITY)
Admission: RE | Admit: 2012-08-30 | Discharge: 2012-08-30 | Disposition: A | Discharge status: Home / Self Care | Payer: Medicaid Other | Source: Ambulatory Visit | Attending: Family Medicine | Admitting: Family Medicine

## 2012-08-30 ENCOUNTER — Ambulatory Visit (INDEPENDENT_AMBULATORY_CARE_PROVIDER_SITE_OTHER): Payer: Medicaid Other | Admitting: Family Medicine

## 2012-08-30 ENCOUNTER — Encounter: Payer: Self-pay | Admitting: Family Medicine

## 2012-08-30 VITALS — BP 107/73 | HR 96 | Temp 98.4°F | Ht 63.0 in | Wt 280.3 lb

## 2012-08-30 DIAGNOSIS — Z113 Encounter for screening for infections with a predominantly sexual mode of transmission: Secondary | ICD-10-CM | POA: Insufficient documentation

## 2012-08-30 DIAGNOSIS — B373 Candidiasis of vulva and vagina: Secondary | ICD-10-CM | POA: Insufficient documentation

## 2012-08-30 DIAGNOSIS — K219 Gastro-esophageal reflux disease without esophagitis: Secondary | ICD-10-CM

## 2012-08-30 DIAGNOSIS — N760 Acute vaginitis: Secondary | ICD-10-CM

## 2012-08-30 DIAGNOSIS — B3731 Acute candidiasis of vulva and vagina: Secondary | ICD-10-CM

## 2012-08-30 LAB — POCT WET PREP (WET MOUNT): Clue Cells Wet Prep Whiff POC: NEGATIVE

## 2012-08-30 MED ORDER — FLUCONAZOLE 150 MG PO TABS: 150.0000 mg | ORAL_TABLET | Freq: Once | ORAL | Status: DC

## 2012-08-30 MED ORDER — OMEPRAZOLE 20 MG PO CPDR: 20.0000 mg | DELAYED_RELEASE_CAPSULE | Freq: Every day | ORAL | Status: DC

## 2012-08-30 NOTE — Patient Instructions (Addendum)
Will call [phone] with results of tests  Diet for Gastroesophageal Reflux Disease, Adult Reflux is when stomach acid flows up into the esophagus. The esophagus becomes irritated and sore (inflammation). When reflux happens often and is severe, it is called gastroesophageal reflux disease (GERD). What you eat can help ease any discomfort caused by GERD. FOODS OR DRINKS TO AVOID OR LIMIT  Coffee and black tea, with or without caffeine.   Bubbly (carbonated) drinks with caffeine or energy drinks.   Strong spices, such as pepper, cayenne pepper, curry, or chili powder.   Peppermint or spearmint.   Chocolate.   High-fat foods, such as meats, fried food, oils, butter, or nuts.   Fruits and vegetables that cause discomfort. This includes citrus fruits and tomatoes.   Alcohol.  If a certain food or drink irritates your GERD, avoid eating or drinking it. THINGS THAT MAY HELP GERD INCLUDE:  Eat meals slowly.   Eat 5 to 6 small meals a day, not 3 large meals.   Do not eat food for a certain amount of time if it causes discomfort.   Wait 3 hours after eating before lying down.   Keep the head of your bed raised 6 to 9 inches (15 23 centimeters). Put a foam wedge or blocks under the legs of the bed.   Stay active. Weight loss, if needed, may help ease your discomfort.   Wear loose-fitting clothing.   Do not smoke or chew tobacco.  Document Released: 01/23/2012 Document Reviewed: 10/03/2011 [facility] Patient Information 2013 [place], [place].

## 2012-08-30 NOTE — Progress Notes (Signed)
  Subjective:    Patient ID: Krystal [name], female    DOB: 1986-04-24, 27 y.o.   MRN: [id number]  HPIHere for work in appt for vagina itching  Yeast vaginitis:  Tried monistat last week but did not help.  Notes vaginal itching some mild discharge no odor.  Stable partner in monogamous relationship.    No dysuria, abdominal pain, fever.  No recent antibiotics.  Notes DM has not been optimally controlled.  Heartburn:  Feels burning in chest with nausea x 1 week.  No diet changes.  NO trigger foods she knows.  Worse when laying down.  Has tried tums with a little releif.   I have reviewed patient's  PMH, FH, and Social history and Medications as related to this visit.   Review of Systems See HPI    Objective:   Physical Exam GEN: Alert & Oriented, No acute distress CV:  Regular Rate & Rhythm, no murmur Respiratory:  Normal work of breathing, CTAB Abd:  + BS, soft, no tenderness to palpation Ext: no pre-tibial edema Pelvic Exam:        External: normal female genitalia without lesions or masses        Vagina: normal without lesions or masses        Cervix: normal without lesions or masses        Adnexa: normal bimanual exam without masses or fullness        Uterus: normal by palpation        Samples for Wet prep, GC/Chlamydia obtained        Assessment & Plan:

## 2012-08-30 NOTE — Assessment & Plan Note (Signed)
Diflucan 150 mg once, may repeat in 3 days if needed

## 2012-08-30 NOTE — Assessment & Plan Note (Signed)
Gave edu handout on foods and behaviors to help with symptoms. Will rx omeprazole.

## 2012-09-03 ENCOUNTER — Encounter: Payer: Self-pay | Admitting: Family Medicine

## 2012-09-12 ENCOUNTER — Encounter: Payer: Self-pay | Admitting: Family Medicine

## 2012-09-12 ENCOUNTER — Ambulatory Visit (INDEPENDENT_AMBULATORY_CARE_PROVIDER_SITE_OTHER): Payer: Medicaid Other | Admitting: Family Medicine

## 2012-09-12 VITALS — BP 130/80 | HR 84 | Ht 63.0 in | Wt 280.0 lb

## 2012-09-12 DIAGNOSIS — M79609 Pain in unspecified limb: Secondary | ICD-10-CM

## 2012-09-12 DIAGNOSIS — M79673 Pain in unspecified foot: Secondary | ICD-10-CM | POA: Insufficient documentation

## 2012-09-12 NOTE — Assessment & Plan Note (Signed)
Likely due to plantar fasciitis.  Recommended stretches for plantar fascia and icing daily.  Discussed that it may take a while to improve.  Can try inserts for shoes to see if this is helpful.

## 2012-09-12 NOTE — Progress Notes (Signed)
  Subjective:    Patient ID: Krystal [name], female    DOB: 1985/12/10, 27 y.o.   MRN: [id number]  HPI 1. Foot pain: c/o foot pain in R foot x1 day. Located along bottom of foot from heel to mid foot.  Pain described as sharp pain, worse when she first got up and started walking this morning.  Improved after walking a while but still present.  She denies swelling of the foot.  Denies any trauma to the foot or ankle.    Review of Systems Per HPI    Objective:   Physical Exam  Constitutional:       Obese female, nad   HENT:  Head: Normocephalic and atraumatic.  Musculoskeletal:       R foot normal to inspection.  Mild tenderness at insertion of plantar fascia along calcaneus.  ROM is normal.  No swelling or erythema seen.           Assessment & Plan:

## 2012-09-12 NOTE — Patient Instructions (Addendum)

## 2012-10-15 ENCOUNTER — Encounter: Payer: Self-pay | Admitting: Family Medicine

## 2012-10-15 ENCOUNTER — Ambulatory Visit (INDEPENDENT_AMBULATORY_CARE_PROVIDER_SITE_OTHER): Payer: Medicaid Other | Admitting: Family Medicine

## 2012-10-15 VITALS — BP 120/78 | HR 98 | Temp 98.0°F | Ht 63.0 in | Wt 286.0 lb

## 2012-10-15 DIAGNOSIS — R21 Rash and other nonspecific skin eruption: Secondary | ICD-10-CM | POA: Insufficient documentation

## 2012-10-15 MED ORDER — CETIRIZINE HCL 10 MG PO TABS: 10.0000 mg | ORAL_TABLET | Freq: Every day | ORAL | Status: DC

## 2012-10-15 MED ORDER — HYDROXYZINE HCL 25 MG PO TABS: 25.0000 mg | ORAL_TABLET | Freq: Three times a day (TID) | ORAL | Status: DC | PRN

## 2012-10-15 MED ORDER — HYDROCORTISONE 0.5 % EX CREA: TOPICAL_CREAM | Freq: Two times a day (BID) | CUTANEOUS | Status: DC

## 2012-10-15 NOTE — Assessment & Plan Note (Signed)
Uriticarial rash most evident behind neck and above axilla with erythema or chest, upper back, and arms.  It does not appear to be consistent with scabies or ringworm especially since no known contacts.  I wondered about fleas, however, it is not consistent with her history.  Possible cause of urticaria may be exposure to something at her uncle's house where he is renovating.  -We will treat at urticaria at this time with antihistamine and low potency steroid cream.  -Follow-up in a week, may cancel appointment if rash is improving.

## 2012-10-15 NOTE — Progress Notes (Signed)
  Subjective:    Patient ID: Krystal [name], female    DOB: 24-Jan-1986, 27 y.o.   MRN: [id number]  HPI  # Rash It started Sunday night around 8:30-9 pm around her neck. She initially thought it was a heat rash but progressed the next day.  Now, 2 days later, it is on her stomach, back, arms, shoulders, chest; her head itches as well. It does not hurt but patches. She feels the rash is getting worse. Denies new environments (farm, woods), new medications, new foods, new soaps/lotions/laundry soap, new sheets/clothes/blankets. She went to her uncle's house early Sunday around 6 pm, and she was in there for about 10 minutes and he is re-doing his carpets and it was dusty; [place]. He does not have pets.   She does not have pets.  She denies pet or food allergies.   She is sexually active. Last intercourse over a week ago.   Medications tried:  -Unscented lotion for extrasensitive dry skin--helped a little bit to allow her to sleep   Her husband and her child do not have symptoms.   Review of Systems Per HPI Denies difficulty breathing or tongue swelling  Denies nausea/vomiting/fevers/chills Denies abnormal vaginal discharge  Allergies, medication, past medical history reviewed.  Smoking status noted.  T2DM not on insulin  Tobacco History of mild eczema with family history      Objective:   Physical Exam GEN: NAD; obese SKIN: papular rash most obvious back of neck and above arm pits; erythema along arms, chest, upper back without papules; no involvement of finger webs/extensors or lower extremities    Assessment & Plan:

## 2012-10-15 NOTE — Patient Instructions (Addendum)
For your itching: -Try Atarax (Vistaril) during the daytime as needed for itching (prescription sent); you can try Benadryl 25 mg at bedtime for itching to help you sleep -Try hydrocortisone cream on the rashes (prescription sent)  -Try cetirzine (anti-histamine) (prescription sent)   If you develop tongue swelling or breathing difficulties go to the ED Make an appointment for this Friday or next Monday; you may cancel appointment if you are getting better

## 2012-10-30 ENCOUNTER — Ambulatory Visit (INDEPENDENT_AMBULATORY_CARE_PROVIDER_SITE_OTHER): Payer: Medicaid Other | Admitting: Family Medicine

## 2012-10-30 ENCOUNTER — Encounter: Payer: Self-pay | Admitting: Family Medicine

## 2012-10-30 VITALS — BP 119/74 | HR 106 | Temp 97.9°F | Ht 63.0 in | Wt 287.0 lb

## 2012-10-30 DIAGNOSIS — Z3009 Encounter for other general counseling and advice on contraception: Secondary | ICD-10-CM

## 2012-10-30 DIAGNOSIS — K219 Gastro-esophageal reflux disease without esophagitis: Secondary | ICD-10-CM

## 2012-10-30 DIAGNOSIS — E119 Type 2 diabetes mellitus without complications: Secondary | ICD-10-CM

## 2012-10-30 DIAGNOSIS — F17200 Nicotine dependence, unspecified, uncomplicated: Secondary | ICD-10-CM

## 2012-10-30 DIAGNOSIS — R110 Nausea: Secondary | ICD-10-CM

## 2012-10-30 MED ORDER — METFORMIN HCL 1000 MG PO TABS: 1000.0000 mg | ORAL_TABLET | Freq: Two times a day (BID) | ORAL | Status: DC

## 2012-10-30 NOTE — Assessment & Plan Note (Signed)
U preg neg today.  Reiterated importance of medication compliance and using condoms if she misses pills.

## 2012-10-30 NOTE — Patient Instructions (Signed)
It was good to see you today.  Your Hemoglobin A1C is  Lab Results  Component Value Date   HGBA1C 8.5 10/30/2012  .  Remember your goal for A1C is less than 7.  Your goal for fasting morning blood sugar is 80-120.   - Please increase your metformin to a whole pill twice a day, and continue the glyburide.

## 2012-10-30 NOTE — Progress Notes (Signed)
  Subjective:    Patient ID: Krystal [name], female    DOB: 30-Apr-1986, 27 y.o.   MRN: [id number]  HPI:  Thula comes in for follow up:   DM: Patient is taking metformin 500 mg BID and glyburide 2.5 in am and 7.5 in pm.  Patient is not frequently checking blood sugars.  When she does check, casting sugars range from 100 to 150.  No hyper or hypoglycemic episodes, no polyuria or polydypisa  Tobacco abuse: Smoking 3-4 cigarettes a day.  Wants to quit.  Her husband does not smoke and no one else in her family does.  She says her stress level makes it hard to quit.   Contraception: taking OCP's but did miss a few days last month.  Her husband is worried she is pregnant because she has been moody, her breasts have been hurting and she has been having bad GERD.  GERD: occasionally takes omeprazole but not on a regular basis.   Past Medical History  Diagnosis Date  . Diabetes mellitus   . Depression bi-polar  . Obesity     History  Substance Use Topics  . Smoking status: Current Every Day Smoker -- 0.25 packs/day for 6 years    Types: Cigarettes  . Smokeless tobacco: Never Used     Comment: trying to quitt  . Alcohol Use: No   ROS Pertinent items in HPI    Objective:  Physical Exam:  BP 119/74  Pulse 106  Temp(Src) 97.9 F (36.6 C) (Oral)  Ht 5\' 3"  (1.6 m)  Wt 287 lb (130.182 kg)  BMI 50.85 kg/m2  LMP 10/08/2012 General appearance: alert, cooperative and no distress Head: Normocephalic, without obvious abnormality, atraumatic Lungs: clear to auscultation bilaterally Heart: regular rate and rhythm, S1, S2 normal, no murmur, click, rub or gallop Pulses: 2+ and symmetric       Assessment & Plan:

## 2012-10-30 NOTE — Assessment & Plan Note (Signed)
Cutting back wants to quit- counseled to quit.  Will have her meet with health coach to discuss smoking cessation.

## 2012-10-30 NOTE — Assessment & Plan Note (Signed)
Improved control but not yet at goal.  Will increase metformin, continue glyburide at current dose.  Will have her meet with health coach today too.

## 2012-11-06 ENCOUNTER — Telehealth: Payer: Self-pay | Admitting: *Deleted

## 2012-11-06 NOTE — Telephone Encounter (Signed)
F/u health coaching call from visit last wk. Pt very excited about progress and states she feels very energetic. Patient identified concern weight loss Stage of change action, dietary adjustments- has stopped soda and eating smaller portions, worked out 30 min 3x wk this past wk Patient reported barriers denied barriers at this time Patient perceived benefits more energy, better control of blood sugar Patient's self efficacy 10 Behavioral support system husband Goals: cont dietary changes and will work out 30 min 3x wk Patient education none Follow up: will call pt in 1-2 wks to check in

## 2012-11-18 ENCOUNTER — Telehealth: Payer: Self-pay | Admitting: *Deleted

## 2012-11-18 NOTE — Telephone Encounter (Signed)
Spoke to pt briefly to f/u health coaching. Pt reports she has lost 2 lbs and "things are going well" plans to continue to exercise and make healthy food choices. Instructed pt to call [facility] as needed.

## 2012-12-19 ENCOUNTER — Inpatient Hospital Stay (HOSPITAL_COMMUNITY)
Admission: AD | Admit: 2012-12-19 | Discharge: 2012-12-20 | Disposition: A | Discharge status: Home / Self Care | Payer: Medicaid Other | Source: Ambulatory Visit | Attending: Obstetrics and Gynecology | Admitting: Obstetrics and Gynecology

## 2012-12-19 ENCOUNTER — Encounter (HOSPITAL_COMMUNITY): Payer: Self-pay

## 2012-12-19 DIAGNOSIS — O26899 Other specified pregnancy related conditions, unspecified trimester: Secondary | ICD-10-CM

## 2012-12-19 DIAGNOSIS — O99891 Other specified diseases and conditions complicating pregnancy: Secondary | ICD-10-CM | POA: Insufficient documentation

## 2012-12-19 DIAGNOSIS — R109 Unspecified abdominal pain: Secondary | ICD-10-CM | POA: Insufficient documentation

## 2012-12-19 LAB — URINALYSIS, ROUTINE W REFLEX MICROSCOPIC
Glucose, UA: NEGATIVE mg/dL
Hgb urine dipstick: NEGATIVE
Ketones, ur: 15 mg/dL — AB
Protein, ur: NEGATIVE mg/dL
pH: 6 (ref 5.0–8.0)

## 2012-12-19 LAB — URINE MICROSCOPIC-ADD ON

## 2012-12-19 NOTE — MAU Note (Signed)
Lower abdominal cramping and lower back pain since today. Later tonight pain in abdomen with urination. Has always had irregular periods and thinks her last period was 4 months ago.

## 2012-12-19 NOTE — MAU Note (Signed)
Pt states she started having cramping about 2 days ago, pt states it became worse today while she was at work [employer]

## 2012-12-19 NOTE — MAU Provider Note (Signed)
History     CSN: [id number]  Arrival date and time: 12/19/12 2230   First Provider Initiated Contact with Patient 12/19/12 2344      Chief Complaint  Patient presents with  . Abdominal Pain   HPI Krystal [name] is a 27 y.o. female who is G1P1001 who presents tonight with a few days of cramping.  She has a PMH of T2DM.  She states that she has had cramps all day similar to menstrual cramps rated at a 5/10.  She has taken nothing for these cramps.  She was last sexually active this morning and her last menstrual period was at least 2 months ago.  She states she has irregular periods.  She sees Dr Erlene Senters for her OBGYN care.  She has been eating and drinking fine.  She denies vaginal bleeding or discharge.  She denies fevers, no dysuria or dyschezia.   OB History   Grav Para Term Preterm Abortions TAB SAB Ect Mult Living   1 1 1       1       Past Medical History  Diagnosis Date  . Diabetes mellitus   . Depression bi-polar  . Obesity     Past Surgical History  Procedure Laterality Date  . No past surgeries    . Mouth surgery      Family History  Problem Relation Age of Onset  . Asthma Mother   . Diabetes Mother   . Alcohol abuse Mother     History  Substance Use Topics  . Smoking status: Current Every Day Smoker -- 0.25 packs/day for 6 years    Types: Cigarettes  . Smokeless tobacco: Never Used     Comment: trying to quitt  . Alcohol Use: No    Allergies: No Known Allergies  Prescriptions prior to admission  Medication Sig Dispense Refill  . Blood Glucose Monitoring Suppl (ACCU-CHEK NANO SMARTVIEW) W/DEVICE KIT 1 kit by Does not apply route once.  1 kit  0  . cetirizine (ZYRTEC) 10 MG tablet Take 1 tablet (10 mg total) by mouth daily.  30 tablet  0  . fluticasone (FLONASE) 50 MCG/ACT nasal spray Place 2 sprays into the nose daily.  16 g  6  . glucose blood (ACCU-CHEK ACTIVE STRIPS) test strip Check Blood sugars once daily in AM  100 each  12  . glyBURIDE  (DIABETA) 2.5 MG tablet TAKE 1 TABLET BY MOUTH IN THE MORNING AND 3 TABLETS BY MOUTH IN THE EVENING  120 tablet  11  . hydrocortisone cream 0.5 % Apply topically 2 (two) times daily.  30 g  0  . hydrOXYzine (ATARAX/VISTARIL) 25 MG tablet Take 1 tablet (25 mg total) by mouth 3 (three) times daily as needed for itching.  30 tablet  0  . ibuprofen (ADVIL,MOTRIN) 800 MG tablet Take 1 tablet (800 mg total) by mouth every 8 (eight) hours as needed. For pain  60 tablet  0  . Lancet Devices (ACCU-CHEK SOFTCLIX) lancets Check Blood sugar once daily in AM  1 each  11  . metFORMIN (GLUCOPHAGE) 1000 MG tablet Take 1 tablet (1,000 mg total) by mouth 2 (two) times daily with a meal.  30 tablet  11  . norgestimate-ethinyl estradiol (ORTHO-CYCLEN,SPRINTEC,PREVIFEM) 0.25-35 MG-MCG tablet Take 1 tablet by mouth daily.  28 tablet  11  . omeprazole (PRILOSEC) 20 MG capsule Take 1 capsule (20 mg total) by mouth daily.  30 capsule  3    Review of Systems  Constitutional:  Negative for fever and chills.  Respiratory: Negative for cough, sputum production and wheezing.   Cardiovascular: Negative for chest pain and palpitations.  Gastrointestinal: Positive for abdominal pain. Negative for nausea, vomiting, diarrhea and constipation.  Genitourinary: Negative for dysuria and frequency.  Neurological: Negative for weakness and headaches.   Physical Exam   Blood pressure 101/39, pulse 113, temperature 98.9 F (37.2 C), resp. rate 18, height 5\' 4"  (1.626 m), weight 131.271 kg (289 lb 6.4 oz), SpO2 99.00%.  Physical Exam  Constitutional: She is oriented to person, place, and time. She appears well-developed and well-nourished.  HENT:  Head: Normocephalic.  Eyes: Pupils are equal, round, and reactive to light.  Cardiovascular: Normal rate and regular rhythm.  Exam reveals no gallop and no friction rub.   No murmur heard. Respiratory: Effort normal and breath sounds normal. No respiratory distress. She has no wheezes.  She has no rales.  GI: Soft. Bowel sounds are normal. She exhibits no distension. There is tenderness.  She has tenderness to palpation across the lower abdomen.    Genitourinary:  Denied pelvic exam at this time.    Neurological: She is alert and oriented to person, place, and time.    MAU Course  Procedures Results for orders placed during the hospital encounter of 12/19/12 (from the past 24 hour(s))  URINALYSIS, ROUTINE W REFLEX MICROSCOPIC     Status: Abnormal   Collection Time    12/19/12 10:45 PM      Result Value Range   Color, Urine YELLOW  YELLOW   APPearance CLEAR  CLEAR   Specific Gravity, Urine >1.030 (*) 1.005 - 1.030   pH 6.0  5.0 - 8.0   Glucose, UA NEGATIVE  NEGATIVE mg/dL   Hgb urine dipstick NEGATIVE  NEGATIVE   Bilirubin Urine NEGATIVE  NEGATIVE   Ketones, ur 15 (*) NEGATIVE mg/dL   Protein, ur NEGATIVE  NEGATIVE mg/dL   Urobilinogen, UA 0.2  0.0 - 1.0 mg/dL   Nitrite NEGATIVE  NEGATIVE   Leukocytes, UA SMALL (*) NEGATIVE  URINE MICROSCOPIC-ADD ON     Status: Abnormal   Collection Time    12/19/12 10:45 PM      Result Value Range   Squamous Epithelial / LPF FEW (*) RARE   WBC, UA 3-6  <3 WBC/hpf   RBC / HPF 0-2  <3 RBC/hpf   Bacteria, UA FEW (*) RARE  POCT PREGNANCY, URINE     Status: Abnormal   Collection Time    12/19/12 10:52 PM      Result Value Range   Preg Test, Ur POSITIVE (*) NEGATIVE  HCG, QUANTITATIVE, PREGNANCY     Status: Abnormal   Collection Time    12/20/12 12:10 AM      Result Value Range   hCG, Beta Chain, Quant, S 338 (*) <5 mIU/mL  CBC     Status: Abnormal   Collection Time    12/20/12 12:10 AM      Result Value Range   WBC 11.5 (*) 4.0 - 10.5 K/uL   RBC 4.59  3.87 - 5.11 MIL/uL   Hemoglobin 12.8  12.0 - 15.0 g/dL   HCT 16.1  09.6 - 04.5 %   MCV 83.9  78.0 - 100.0 fL   MCH 27.9  26.0 - 34.0 pg   MCHC 33.2  30.0 - 36.0 g/dL   RDW 40.9  81.1 - 91.4 %   Platelets 247  150 - 400 K/uL   US Ob Comp Less 14  Wks  12/20/2012    *RADIOLOGY REPORT*  Clinical Data: Positive pregnancy test, cramping.  OBSTETRIC <14 WK [place] AND TRANSVAGINAL OB US  Technique:  Both transabdominal and transvaginal ultrasound examinations were performed for complete evaluation of the gestation as well as the maternal uterus, adnexal regions, and pelvic cul-de-sac.  Transvaginal technique was performed to assess early pregnancy.  Comparison:  None.  Intrauterine gestational sac:  Not identified  Maternal uterus/adnexae: Normal sonographic appearance to the ovaries.  No free fluid or adnexal mass visualized.  IMPRESSION: No intrauterine gestation.  Differential includes an early pregnancy below the threshold to be seen with ultrasound (favored), missed / failed pregnancy, or ectopic pregnancy.  Recommend serial quantitative beta HCG correlation and short-term ultrasound follow- up as warranted.   Original Report Authenticated By: Jearld Lesch, M.D.   US Ob Transvaginal  12/20/2012   *RADIOLOGY REPORT*  Clinical Data: Positive pregnancy test, cramping.  OBSTETRIC <14 WK [place] AND TRANSVAGINAL OB US  Technique:  Both transabdominal and transvaginal ultrasound examinations were performed for complete evaluation of the gestation as well as the maternal uterus, adnexal regions, and pelvic cul-de-sac.  Transvaginal technique was performed to assess early pregnancy.  Comparison:  None.  Intrauterine gestational sac:  Not identified  Maternal uterus/adnexae: Normal sonographic appearance to the ovaries.  No free fluid or adnexal mass visualized.  IMPRESSION: No intrauterine gestation.  Differential includes an early pregnancy below the threshold to be seen with ultrasound (favored), missed / failed pregnancy, or ectopic pregnancy.  Recommend serial quantitative beta HCG correlation and short-term ultrasound follow- up as warranted.   Original Report Authenticated By: Jearld Lesch, M.D.   MDM   Assessment and Plan  Due to ongoing concern for ectopic pregnancy, we  will have patient return in 48 hours for repeat of beta HCG.  Since patient denied STI testing at today's visit, we recommend she have this performed when she returns for bloodwork.    Anna Genre 12/19/2012, 11:45 PM   I was present for the exam and agree with above. Pt refused discharge vital signs.  [place], [place] 12/20/2012 5:33 AM

## 2012-12-20 ENCOUNTER — Inpatient Hospital Stay (HOSPITAL_COMMUNITY): Payer: Medicaid Other

## 2012-12-20 ENCOUNTER — Encounter (HOSPITAL_COMMUNITY): Payer: Self-pay | Admitting: *Deleted

## 2012-12-20 LAB — HCG, QUANTITATIVE, PREGNANCY: hCG, Beta Chain, Quant, S: 338 m[IU]/mL — ABNORMAL HIGH (ref <5)

## 2012-12-20 LAB — CBC
HCT: 38.5 % (ref 36.0–46.0)
MCHC: 33.2 g/dL (ref 30.0–36.0)
MCV: 83.9 fL (ref 78.0–100.0)
Platelets: 247 10*3/uL (ref 150–400)
RDW: 13.2 % (ref 11.5–15.5)
WBC: 11.5 10*3/uL — ABNORMAL HIGH (ref 4.0–10.5)

## 2012-12-20 NOTE — Progress Notes (Signed)
Pt refused to have Vital Signs repeated as per Ivonne Andrew CNM

## 2012-12-20 NOTE — MAU Provider Note (Signed)
Attestation of Attending Supervision of Advanced Practitioner (CNM/NP): Evaluation and management procedures were performed by the Advanced Practitioner under my supervision and collaboration.  I have reviewed the Advanced Practitioner's note and chart, and I agree with the management and plan.  Adayah Arocho 12/20/2012 6:36 AM

## 2012-12-21 LAB — URINE CULTURE: Colony Count: >=100000

## 2012-12-22 ENCOUNTER — Inpatient Hospital Stay (HOSPITAL_COMMUNITY)
Admission: AD | Admit: 2012-12-22 | Discharge: 2012-12-22 | Disposition: A | Discharge status: Home / Self Care | Payer: Medicaid Other | Source: Ambulatory Visit | Attending: Obstetrics & Gynecology | Admitting: Obstetrics & Gynecology

## 2012-12-22 ENCOUNTER — Telehealth: Payer: Self-pay | Admitting: Advanced Practice Midwife

## 2012-12-22 DIAGNOSIS — O9989 Other specified diseases and conditions complicating pregnancy, childbirth and the puerperium: Secondary | ICD-10-CM

## 2012-12-22 DIAGNOSIS — O26899 Other specified pregnancy related conditions, unspecified trimester: Secondary | ICD-10-CM

## 2012-12-22 DIAGNOSIS — O2341 Unspecified infection of urinary tract in pregnancy, first trimester: Secondary | ICD-10-CM

## 2012-12-22 DIAGNOSIS — O99891 Other specified diseases and conditions complicating pregnancy: Secondary | ICD-10-CM | POA: Insufficient documentation

## 2012-12-22 DIAGNOSIS — O2390 Unspecified genitourinary tract infection in pregnancy, unspecified trimester: Secondary | ICD-10-CM | POA: Insufficient documentation

## 2012-12-22 DIAGNOSIS — R109 Unspecified abdominal pain: Secondary | ICD-10-CM

## 2012-12-22 DIAGNOSIS — N390 Urinary tract infection, site not specified: Secondary | ICD-10-CM | POA: Insufficient documentation

## 2012-12-22 MED ORDER — NITROFURANTOIN MONOHYD MACRO 100 MG PO CAPS: 100.0000 mg | ORAL_CAPSULE | Freq: Two times a day (BID) | ORAL | Status: AC

## 2012-12-22 NOTE — MAU Provider Note (Signed)
History   Chief Complaint:  Follow-up   Krystal [name] is  27 y.o. G2P1001 at uncertain LMP.  Patient is here for follow up of quantitative HCG and ongoing surveillance of pregnancy status.   She is Unknown weeks gestation.    Since her last visit, the patient is without new complaint.   The patient reports bleeding as  none now.  Denies abdominal pain.  General ROS:  negative  Her previous Quantitative HCG values are:  Results for SKYA, MCCULLUM (MRN [id number]) as of 12/22/2012 15:28  Ref. Range 12/20/2012 00:10  hCG, Beta Chain, Quant, S Latest Range: <5 mIU/mL 338 (H)    Physical Exam   Blood pressure 117/73, pulse 95, temperature 98.1 F (36.7 C), temperature source Oral, resp. rate 18.  Focused Gynecological Exam: examination not indicated  Labs: Results for orders placed during the hospital encounter of 12/22/12 (from the past 24 hour(s))  HCG, QUANTITATIVE, PREGNANCY   Collection Time    12/22/12 11:01 AM      Result Value Range   hCG, Beta Chain, Quant, S 683 (*) <5 mIU/mL    Ultrasound Studies:   US Ob Comp Less 14 Wks  12/20/2012   *RADIOLOGY REPORT*  Clinical Data: Positive pregnancy test, cramping.  OBSTETRIC <14 WK [place] AND TRANSVAGINAL OB US  Technique:  Both transabdominal and transvaginal ultrasound examinations were performed for complete evaluation of the gestation as well as the maternal uterus, adnexal regions, and pelvic cul-de-sac.  Transvaginal technique was performed to assess early pregnancy.  Comparison:  None.  Intrauterine gestational sac:  Not identified  Maternal uterus/adnexae: Normal sonographic appearance to the ovaries.  No free fluid or adnexal mass visualized.  IMPRESSION: No intrauterine gestation.  Differential includes an early pregnancy below the threshold to be seen with ultrasound (favored), missed / failed pregnancy, or ectopic pregnancy.  Recommend serial quantitative beta HCG correlation and short-term ultrasound follow- up as  warranted.   Original Report Authenticated By: Jearld Lesch, M.D.   US Ob Transvaginal  12/20/2012   *RADIOLOGY REPORT*  Clinical Data: Positive pregnancy test, cramping.  OBSTETRIC <14 WK [place] AND TRANSVAGINAL OB US  Technique:  Both transabdominal and transvaginal ultrasound examinations were performed for complete evaluation of the gestation as well as the maternal uterus, adnexal regions, and pelvic cul-de-sac.  Transvaginal technique was performed to assess early pregnancy.  Comparison:  None.  Intrauterine gestational sac:  Not identified  Maternal uterus/adnexae: Normal sonographic appearance to the ovaries.  No free fluid or adnexal mass visualized.  IMPRESSION: No intrauterine gestation.  Differential includes an early pregnancy below the threshold to be seen with ultrasound (favored), missed / failed pregnancy, or ectopic pregnancy.  Recommend serial quantitative beta HCG correlation and short-term ultrasound follow- up as warranted.   Original Report Authenticated By: Jearld Lesch, M.D.    Assessment: Early pregnancy with appropriate rise in [place]. UTI and pregnancy.  Plan: Discharge home in stable condition. Follow-up Information   Follow up with [facility], [place] On 12/27/2012. (As scheduled)    Contact information:   [address] [place] [address] [phone]       Follow up with [facility] [place] MATERNITY ADMISSIONS. (As needed if symptoms worsen)    Contact information:   [address] [place] [address] [phone]       Medication List    TAKE these medications       ACCU-CHEK NANO SMARTVIEW W/DEVICE Kit  1 kit by  Does not apply route once.     accu-chek softclix lancets  Check Blood sugar once daily in AM     cetirizine 10 MG tablet  Commonly known as:  ZYRTEC  Take 1 tablet (10 mg total) by mouth daily.     fluticasone 50 MCG/ACT nasal spray  Commonly known as:  FLONASE  Place 2 sprays into the nose  daily.     glucose blood test strip  Commonly known as:  ACCU-CHEK ACTIVE STRIPS  Check Blood sugars once daily in AM     glyBURIDE 2.5 MG tablet  Commonly known as:  DIABETA  TAKE 1 TABLET BY MOUTH IN THE MORNING AND 3 TABLETS BY MOUTH IN THE EVENING     hydrocortisone cream 0.5 %  Apply topically 2 (two) times daily.     hydrOXYzine 25 MG tablet  Commonly known as:  ATARAX/VISTARIL  Take 1 tablet (25 mg total) by mouth 3 (three) times daily as needed for itching.     ibuprofen 800 MG tablet  Commonly known as:  ADVIL,MOTRIN  Take 1 tablet (800 mg total) by mouth every 8 (eight) hours as needed. For pain     metFORMIN 1000 MG tablet  Commonly known as:  GLUCOPHAGE  Take 1 tablet (1,000 mg total) by mouth 2 (two) times daily with a meal.     nitrofurantoin (macrocrystal-monohydrate) 100 MG capsule  Commonly known as:  MACROBID  Take 1 capsule (100 mg total) by mouth 2 (two) times daily.     omeprazole 20 MG capsule  Commonly known as:  PRILOSEC  Take 1 capsule (20 mg total) by mouth daily.       Hiba Garry 12/22/2012, 11:09 AM

## 2012-12-22 NOTE — Telephone Encounter (Signed)
UTI. Rx Macrobid.  

## 2012-12-22 NOTE — MAU Note (Signed)
Pt here for f/u BHCG  Denies any pain or bleeding today. Has f/u appointment with [facility] on Friday

## 2012-12-27 ENCOUNTER — Ambulatory Visit (INDEPENDENT_AMBULATORY_CARE_PROVIDER_SITE_OTHER): Payer: Medicaid Other | Admitting: Family Medicine

## 2012-12-27 VITALS — BP 114/79 | HR 102 | Temp 98.2°F | Wt 284.0 lb

## 2012-12-27 DIAGNOSIS — N912 Amenorrhea, unspecified: Secondary | ICD-10-CM

## 2012-12-27 DIAGNOSIS — O24919 Unspecified diabetes mellitus in pregnancy, unspecified trimester: Secondary | ICD-10-CM | POA: Insufficient documentation

## 2012-12-27 DIAGNOSIS — Z3201 Encounter for pregnancy test, result positive: Secondary | ICD-10-CM | POA: Insufficient documentation

## 2012-12-27 DIAGNOSIS — O2432 Unspecified pre-existing diabetes mellitus in childbirth: Secondary | ICD-10-CM

## 2012-12-27 DIAGNOSIS — E119 Type 2 diabetes mellitus without complications: Secondary | ICD-10-CM

## 2012-12-27 DIAGNOSIS — F17200 Nicotine dependence, unspecified, uncomplicated: Secondary | ICD-10-CM

## 2012-12-27 DIAGNOSIS — O24911 Unspecified diabetes mellitus in pregnancy, first trimester: Secondary | ICD-10-CM

## 2012-12-27 MED ORDER — GLUCOSE BLOOD VI STRP: ORAL_STRIP | Status: DC

## 2012-12-27 MED ORDER — ACCU-CHEK SOFTCLIX LANCET DEV MISC: Status: DC

## 2012-12-27 MED ORDER — ACCU-CHEK NANO SMARTVIEW W/DEVICE KIT: 1.0000 | PACK | Freq: Once | Status: DC

## 2012-12-27 MED ORDER — GLYBURIDE 5 MG PO TABS: ORAL_TABLET | ORAL | Status: DC

## 2012-12-27 MED ORDER — PRENATAL VITAMINS 0.8 MG PO TABS: 1.0000 | ORAL_TABLET | Freq: Every day | ORAL | Status: DC
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
## 2012-12-27 NOTE — Assessment & Plan Note (Signed)
Refer to [facility] clinic who managed her last pregnancy.

## 2012-12-27 NOTE — Patient Instructions (Addendum)
Congratulations on your pregnancy. I want you to get a new glucometer immediately, and you need to start checking your blood sugar at least once daily in the morning.  Your blood sugar should be from 80-100.    To help control your blood sugars, please avoid and minimize foods with lots of carbohydrates and sugars.  Those foods include breads, pastas, potatoes, corn, sweets and deserts.  Try to increase the amount and variety of vegetables you eat, and include a veggie with each meal.    Please stop smoking as soon as possible for a healthy pregnancy.  Also, please try to walk and exercise as much as possible.   Also please start taking a prenatal vitamin every day, a prescription is sent to your pharmacy.

## 2012-12-27 NOTE — Assessment & Plan Note (Signed)
Stressed importance of stopping smoking for healthy pregnancy.  She understands.

## 2012-12-27 NOTE — Assessment & Plan Note (Signed)
Rx for new prenatal vitamins, discussed DM control at length, smoking cessation, weight management.  Unclear dates, will order dating [place], also refer to High-risk given uncontrolled DM.

## 2012-12-27 NOTE — Assessment & Plan Note (Signed)
Fasting sugar elevated at 189 today.  Will increase glyburide to 5 mg po q am and 10 mg po q PM, continue metformin. Rx for new glucometer with instructions for BID testing. Also discussed diet and exercise.  Concern she may require insulin this pregnancy.  I have instructed her to call the office and report blood sugars in 1 week if she has not heard from high-risk clinic.

## 2012-12-28 LAB — CULTURE, OB URINE

## 2012-12-31 LAB — OBSTETRIC PANEL
Antibody Screen: NEGATIVE
Basophils Absolute: 0 10*3/uL (ref 0.0–0.1)
Basophils Relative: 0 % (ref 0–1)
Eosinophils Absolute: 0.2 10*3/uL (ref 0.0–0.7)
Eosinophils Relative: 2 % (ref 0–5)
HCT: 40.7 % (ref 36.0–46.0)
MCHC: 34.6 g/dL (ref 30.0–36.0)
MCV: 81.6 fL (ref 78.0–100.0)
Monocytes Absolute: 0.6 10*3/uL (ref 0.1–1.0)
Platelets: 327 10*3/uL (ref 150–400)
RDW: 13.7 % (ref 11.5–15.5)

## 2013-01-02 ENCOUNTER — Ambulatory Visit (HOSPITAL_COMMUNITY)
Admission: RE | Admit: 2013-01-02 | Discharge: 2013-01-02 | Disposition: A | Discharge status: Home / Self Care | Payer: Medicaid Other | Source: Ambulatory Visit | Attending: Family Medicine | Admitting: Family Medicine

## 2013-01-02 ENCOUNTER — Encounter: Payer: Self-pay | Admitting: Family Medicine

## 2013-01-02 ENCOUNTER — Telehealth: Payer: Self-pay | Admitting: Family Medicine

## 2013-01-02 DIAGNOSIS — E119 Type 2 diabetes mellitus without complications: Secondary | ICD-10-CM

## 2013-01-02 DIAGNOSIS — O24919 Unspecified diabetes mellitus in pregnancy, unspecified trimester: Secondary | ICD-10-CM | POA: Insufficient documentation

## 2013-01-02 DIAGNOSIS — Z3689 Encounter for other specified antenatal screening: Secondary | ICD-10-CM | POA: Insufficient documentation

## 2013-01-02 DIAGNOSIS — O24911 Unspecified diabetes mellitus in pregnancy, first trimester: Secondary | ICD-10-CM

## 2013-01-02 DIAGNOSIS — N912 Amenorrhea, unspecified: Secondary | ICD-10-CM

## 2013-01-02 DIAGNOSIS — Z3201 Encounter for pregnancy test, result positive: Secondary | ICD-10-CM
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
## 2013-01-20 ENCOUNTER — Encounter: Payer: Self-pay | Admitting: Family Medicine

## 2013-01-20 ENCOUNTER — Ambulatory Visit (INDEPENDENT_AMBULATORY_CARE_PROVIDER_SITE_OTHER): Payer: Medicaid Other | Admitting: Family Medicine

## 2013-01-20 VITALS — BP 127/80 | Wt 287.4 lb

## 2013-01-20 DIAGNOSIS — O24919 Unspecified diabetes mellitus in pregnancy, unspecified trimester: Secondary | ICD-10-CM

## 2013-01-20 DIAGNOSIS — O09299 Supervision of pregnancy with other poor reproductive or obstetric history, unspecified trimester: Secondary | ICD-10-CM | POA: Insufficient documentation

## 2013-01-20 DIAGNOSIS — O09291 Supervision of pregnancy with other poor reproductive or obstetric history, first trimester: Secondary | ICD-10-CM

## 2013-01-20 DIAGNOSIS — O24911 Unspecified diabetes mellitus in pregnancy, first trimester: Secondary | ICD-10-CM

## 2013-01-20 DIAGNOSIS — O0990 Supervision of high risk pregnancy, unspecified, unspecified trimester: Secondary | ICD-10-CM | POA: Insufficient documentation

## 2013-01-20 LAB — POCT URINALYSIS DIP (DEVICE)
Bilirubin Urine: NEGATIVE
Hgb urine dipstick: NEGATIVE
Nitrite: NEGATIVE
Protein, ur: NEGATIVE mg/dL
Urobilinogen, UA: 0.2 mg/dL (ref 0.0–1.0)
pH: 7 (ref 5.0–8.0)

## 2013-01-20 LAB — COMPREHENSIVE METABOLIC PANEL
AST: 12 U/L (ref 0–37)
BUN: 10 mg/dL (ref 6–23)
Calcium: 8.7 mg/dL (ref 8.4–10.5)
Chloride: 101 mEq/L (ref 96–112)
Creat: 0.48 mg/dL — ABNORMAL LOW (ref 0.50–1.10)
Total Bilirubin: 0.4 mg/dL (ref 0.3–1.2)

## 2013-01-20 LAB — TSH: TSH: 0.476 u[IU]/mL (ref 0.350–4.500)

## 2013-01-20 NOTE — Progress Notes (Signed)
Pulse 98 Has not been checking blood sugars.

## 2013-01-20 NOTE — Progress Notes (Signed)
   Subjective:    Krystal [name] is a G2P1001 [redacted]w[redacted]d being seen today for her first obstetrical visit.  Her obstetrical history is significant for obesity, smoker and pre-exisiting DM class B. She is transferred from [facility] with class B DM, with increasing medication requirements. Patient does intend to breast feed. Pregnancy history fully reviewed.  Patient reports no bleeding.  Filed Vitals:   01/20/13 0800  BP: 127/80  Weight: 287 lb 6.4 oz (130.364 kg)    HISTORY: OB History   Grav Para Term Preterm Abortions TAB SAB Ect Mult Living   2 1 1       1      # Outc Date GA Lbr Len/2nd Wgt Sex Del Anes PTL Lv   1 TRM 12/11 [redacted]w[redacted]d  [phone]) M SVD EPI No Yes   Comments: Shoulder Dystocia   2 CUR              Past Medical History  Diagnosis Date  . Diabetes mellitus   . Depression bi-polar  . Obesity    Past Surgical History  Procedure Laterality Date  . No past surgeries    . Mouth surgery     Family History  Problem Relation Age of Onset  . Asthma Mother   . Diabetes Mother   . Alcohol abuse Mother      Exam    System: Breast:  normal appearance, no masses or tenderness   Skin: normal coloration and turgor, no rashes    Neurologic: oriented   Extremities: normal strength, tone, and muscle mass   HEENT sclera clear, anicteric   Mouth/Teeth mucous membranes moist, pharynx normal without lesions   Neck supple   Cardiovascular: regular rate and rhythm   Respiratory:  appears well, vitals normal, no respiratory distress, acyanotic, normal RR, ear and throat exam is normal, neck free of mass or lymphadenopathy, chest clear, no wheezing, crepitations, rhonchi, normal symmetric air entry   Abdomen: soft, non-tender; bowel sounds normal; no masses,  no organomegaly      Assessment:    Pregnancy: G2P1001 Patient Active Problem List   Diagnosis Date Noted  . Supervision of high-risk pregnancy 01/20/2013    Priority: High  . H/O shoulder dystocia in prior  pregnancy, currently pregnant 01/20/2013    Priority: Medium  . Diabetes mellitus, antepartum 12/27/2012    Priority: Medium  . DIABETES MELLITUS II, UNCOMPLICATED 10/04/2006    Priority: Medium  . TOBACCO DEPENDENCE 10/04/2006    Priority: Medium  . GERD (gastroesophageal reflux disease) 08/30/2012  . HIDRADENITIS SUPPURATIVA 03/17/2009  . ECZEMA, ATOPIC 08/26/2007  . DEPRESSION, MILD 05/08/2007  . OBESITY, NOS 10/04/2006  . MIGRAINE, UNSPEC., W/O INTRACTABLE MIGRAINE 10/04/2006        Plan:     Initial labs drawn. Prenatal vitamins. Problem list reviewed and updated. Genetic Screening discussed First Screen: ordered. Family h/o mental retardation in sister  Ultrasound discussed; fetal survey: requested.  Follow up in 2 weeks. Baseline labs, 24 hour urine, TSH, Retinal scan at [facility] ordered, Fetal ECHO needed.  On 2 oral agents and max doses at present, discussed possible need for insulin.  Elizeo Rodriques S 01/20/2013

## 2013-01-20 NOTE — Patient Instructions (Addendum)
Following an appropriate diet and keeping your blood sugar under control is the most important thing to do for your health and that of your unborn baby.  Please check your blood sugar 4 times daily.  Please keep accurate BS logs and bring them with you to every visit.  Please bring your meter also.  Goals for Blood sugar should be: 1. Fasting (first thing in the morning before eating) should be less than 90.   2.  2 hours after meals should be less than 120.  Please eat 3 meals and 3 snacks.  Include protein (meat, dairy-cheese, eggs, nuts) with all meals.  Be mindful that carbohydrates increase your blood sugar.  Not just sweet food (cookies, cake, donuts, fruit, juice, soda) but also bread, pasta, rice, and potatoes.  You have to limit how many carbs you are eating.  Adding exercise, as little as 30 minutes a day can decrease your blood sugar.   Smoking Cessation Quitting smoking is important to your health and has many advantages. However, it is not always easy to quit since nicotine is a very addictive drug. Often times, people try 3 times or more before being able to quit. This document explains the best ways for you to prepare to quit smoking. Quitting takes hard work and a lot of effort, but you can do it. ADVANTAGES OF QUITTING SMOKING  You will live longer, feel better, and live better.  Your body will feel the impact of quitting smoking almost immediately.  Within 20 minutes, blood pressure decreases. Your pulse returns to its normal level.  After 8 hours, carbon monoxide levels in the blood return to normal. Your oxygen level increases.  After 24 hours, the chance of having a heart attack starts to decrease. Your breath, hair, and body stop smelling like smoke.  After 48 hours, damaged nerve endings begin to recover. Your sense of taste and smell improve.  After 72 hours, the body is virtually free of nicotine. Your bronchial tubes relax and breathing becomes  easier.  After 2 to 12 weeks, lungs can hold more air. Exercise becomes easier and circulation improves.  The risk of having a heart attack, stroke, cancer, or lung disease is greatly reduced.  After 1 year, the risk of coronary heart disease is cut in half.  After 5 years, the risk of stroke falls to the same as a nonsmoker.  After 10 years, the risk of lung cancer is cut in half and the risk of other cancers decreases significantly.  After 15 years, the risk of coronary heart disease drops, usually to the level of a nonsmoker.  If you are pregnant, quitting smoking will improve your chances of having a healthy baby.  The people you live with, especially any children, will be healthier.  You will have extra money to spend on things other than cigarettes. QUESTIONS TO THINK ABOUT BEFORE ATTEMPTING TO QUIT You may want to talk about your answers with your caregiver.  Why do you want to quit?  If you tried to quit in the past, what helped and what did not?  What will be the most difficult situations for you after you quit? How will you plan to handle them?  Who can help you through the tough times? Your family? Friends? A caregiver?  What pleasures do you get from smoking? What ways can you still get pleasure if you quit? Here are some questions to ask your caregiver:  How can you help me to be successful  at quitting?  What medicine do you think would be best for me and how should I take it?  What should I do if I need more help?  What is smoking withdrawal like? How can I get information on withdrawal? GET READY  Set a quit date.  Change your environment by getting rid of all cigarettes, ashtrays, matches, and lighters in your home, car, or work. Do not let people smoke in your home.  Review your past attempts to quit. Think about what worked and what did not. GET SUPPORT AND ENCOURAGEMENT You have a better chance of being successful if you have help. You can get  support in many ways.  Tell your family, friends, and co-workers that you are going to quit and need their support. Ask them not to smoke around you.  Get individual, group, or telephone counseling and support. Programs are available at [employer] and health centers. Call your local health department for information about programs in your area.  Spiritual beliefs and practices may help some smokers quit.  Download a "quit meter" on your computer to keep track of quit statistics, such as how long you have gone without smoking, cigarettes not smoked, and money saved.  Get a self-help book about quitting smoking and staying off of tobacco. LEARN NEW SKILLS AND BEHAVIORS  Distract yourself from urges to smoke. Talk to someone, go for a walk, or occupy your time with a task.  Change your normal routine. Take a different route to work. Drink tea instead of coffee. Eat breakfast in a different place.  Reduce your stress. Take a hot bath, exercise, or read a book.  Plan something enjoyable to do every day. Reward yourself for not smoking.  Explore interactive web-based programs that specialize in helping you quit. GET MEDICINE AND USE IT CORRECTLY Medicines can help you stop smoking and decrease the urge to smoke. Combining medicine with the above behavioral methods and support can greatly increase your chances of successfully quitting smoking.  Nicotine replacement therapy helps deliver nicotine to your body without the negative effects and risks of smoking. Nicotine replacement therapy includes nicotine gum, lozenges, inhalers, nasal sprays, and skin patches. Some may be available over-the-counter and others require a prescription.  Antidepressant medicine helps people abstain from smoking, but how this works is unknown. This medicine is available by prescription.  Nicotinic receptor partial agonist medicine simulates the effect of nicotine in your brain. This medicine is available by  prescription. Ask your caregiver for advice about which medicines to use and how to use them based on your health history. Your caregiver will tell you what side effects to look out for if you choose to be on a medicine or therapy. Carefully read the information on the package. Do not use any other product containing nicotine while using a nicotine replacement product.  RELAPSE OR DIFFICULT SITUATIONS Most relapses occur within the first 3 months after quitting. Do not be discouraged if you start smoking again. Remember, most people try several times before finally quitting. You may have symptoms of withdrawal because your body is used to nicotine. You may crave cigarettes, be irritable, feel very hungry, cough often, get headaches, or have difficulty concentrating. The withdrawal symptoms are only temporary. They are strongest when you first quit, but they will go away within 10 14 days. To reduce the chances of relapse, try to:  Avoid drinking alcohol. Drinking lowers your chances of successfully quitting.  Reduce the amount of caffeine you consume.  Once you quit smoking, the amount of caffeine in your body increases and can give you symptoms, such as a rapid heartbeat, sweating, and anxiety.  Avoid smokers because they can make you want to smoke.  Do not let weight gain distract you. Many smokers will gain weight when they quit, usually less than 10 pounds. Eat a healthy diet and stay active. You can always lose the weight gained after you quit.  Find ways to improve your mood other than smoking. FOR MORE INFORMATION  www.smokefree.gov  Document Released: 07/18/2001 Document Revised: 01/23/2012 Document Reviewed: 11/02/2011 [facility] Patient Information 2014 [place], [place].  Pregnancy - First Trimester During sexual intercourse, millions of sperm go into the vagina. Only 1 sperm will penetrate and fertilize the female egg while it is in the Fallopian tube. One week later, the fertilized egg  implants into the wall of the uterus. An embryo begins to develop into a baby. At 6 to 8 weeks, the eyes and face are formed and the heartbeat can be seen on ultrasound. At the end of 12 weeks (first trimester), all the baby's organs are formed. Now that you are pregnant, you will want to do everything you can to have a healthy baby. Two of the most important things are to get good prenatal care and follow your caregiver's instructions. Prenatal care is all the medical care you receive before the baby's birth. It is given to prevent, find, and treat problems during the pregnancy and childbirth. PRENATAL EXAMS  During prenatal visits, your weight, blood pressure, and urine are checked. This is done to make sure you are healthy and progressing normally during the pregnancy.  A pregnant woman should gain 25 to 35 pounds during the pregnancy. However, if you are overweight or underweight, your caregiver will advise you regarding your weight.  Your caregiver will ask and answer questions for you.  Blood work, cervical cultures, other necessary tests, and a Pap test are done during your prenatal exams. These tests are done to check on your health and the probable health of your baby. Tests are strongly recommended and done for HIV with your permission. This is the virus that causes AIDS. These tests are done because medicines can be given to help prevent your baby from being born with this infection should you have been infected without knowing it. Blood work is also used to find out your blood type, previous infections, and follow your blood levels (hemoglobin).  Low hemoglobin (anemia) is common during pregnancy. Iron and vitamins are given to help prevent this. Later in the pregnancy, blood tests for diabetes will be done along with any other tests if any problems develop.  You may need other tests to make sure you and the baby are doing well. CHANGES DURING THE FIRST TRIMESTER  Your body goes through  many changes during pregnancy. They vary from person to person. Talk to your caregiver about changes you notice and are concerned about. Changes can include:  Your menstrual period stops.  The egg and sperm carry the genes that determine what you look like. Genes from you and your partner are forming a baby. The female genes determine whether the baby is a boy or a girl.  Your body increases in girth and you may feel bloated.  Feeling sick to your stomach (nauseous) and throwing up (vomiting). If the vomiting is uncontrollable, call your caregiver.  Your breasts will begin to enlarge and become tender.  Your nipples may stick out more and become  darker.  The need to urinate more. Painful urination may mean you have a bladder infection.  Tiring easily.  Loss of appetite.  Cravings for certain kinds of food.  At first, you may gain or lose a couple of pounds.  You may have changes in your emotions from day to day (excited to be pregnant or concerned something may go wrong with the pregnancy and baby).  You may have more vivid and strange dreams. HOME CARE INSTRUCTIONS   It is very important to avoid all smoking, alcohol and non-prescribed drugs during your pregnancy. These affect the formation and growth of the baby. Avoid chemicals while pregnant to ensure the delivery of a healthy infant.  Start your prenatal visits by the 12th week of pregnancy. They are usually scheduled monthly at first, then more often in the last 2 months before delivery. Keep your caregiver's appointments. Follow your caregiver's instructions regarding medicine use, blood and lab tests, exercise, and diet.  During pregnancy, you are providing food for you and your baby. Eat regular, well-balanced meals. Choose foods such as meat, fish, milk and other low fat dairy products, vegetables, fruits, and whole-grain breads and cereals. Your caregiver will tell you of the ideal weight gain.  You can help morning  sickness by keeping soda crackers at the bedside. Eat a couple before arising in the morning. You may want to use the crackers without salt on them.  Eating 4 to 5 small meals rather than 3 large meals a day also may help the nausea and vomiting.  Drinking liquids between meals instead of during meals also seems to help nausea and vomiting.  A physical sexual relationship may be continued throughout pregnancy if there are no other problems. Problems may be early (premature) leaking of amniotic fluid from the membranes, vaginal bleeding, or belly (abdominal) pain.  Exercise regularly if there are no restrictions. Check with your caregiver or physical therapist if you are unsure of the safety of some of your exercises. Greater weight gain will occur in the last 2 trimesters of pregnancy. Exercising will help:  Control your weight.  Keep you in shape.  Prepare you for labor and delivery.  Help you lose your pregnancy weight after you deliver your baby.  Wear a good support or jogging bra for breast tenderness during pregnancy. This may help if worn during sleep too.  Ask when prenatal classes are available. Begin classes when they are offered.  Do not use hot tubs, steam rooms, or saunas.  Wear your seat belt when driving. This protects you and your baby if you are in an accident.  Avoid raw meat, uncooked cheese, cat litter boxes, and soil used by cats throughout the pregnancy. These carry germs that can cause birth defects in the baby.  The first trimester is a good time to visit your dentist for your dental health. Getting your teeth cleaned is okay. Use a softer toothbrush and brush gently during pregnancy.  Ask for help if you have financial, counseling, or nutritional needs during pregnancy. Your caregiver will be able to offer counseling for these needs as well as refer you for other special needs.  Do not take any medicines or herbs unless told by your caregiver.  Inform your  caregiver if there is any mental or physical domestic violence.  Make a list of emergency phone numbers of family, friends, hospital, and police and fire departments.  Write down your questions. Take them to your prenatal visit.  Do not douche.  Do not cross your legs.  If you have to stand for long periods of time, rotate you feet or take small steps in a circle.  You may have more vaginal secretions that may require a sanitary pad. Do not use tampons or scented sanitary pads. MEDICINES AND DRUG USE IN PREGNANCY  Take prenatal vitamins as directed. The vitamin should contain 1 milligram of folic acid. Keep all vitamins out of reach of children. Only a couple vitamins or tablets containing iron may be fatal to a baby or young child when ingested.  Avoid use of all medicines, including herbs, over-the-counter medicines, not prescribed or suggested by your caregiver. Only take over-the-counter or prescription medicines for pain, discomfort, or fever as directed by your caregiver. Do not use aspirin, ibuprofen, or naproxen unless directed by your caregiver.  Let your caregiver also know about herbs you may be using.  Alcohol is related to a number of birth defects. This includes fetal alcohol syndrome. All alcohol, in any form, should be avoided completely. Smoking will cause low birth rate and premature babies.  Street or illegal drugs are very harmful to the baby. They are absolutely forbidden. A baby born to an addicted mother will be addicted at birth. The baby will go through the same withdrawal an adult does.  Let your caregiver know about any medicines that you have to take and for what reason you take them. SEEK MEDICAL CARE IF:  You have any concerns or worries during your pregnancy. It is better to call with your questions if you feel they cannot wait, rather than worry about them. SEEK IMMEDIATE MEDICAL CARE IF:   An unexplained oral temperature above 102 F (38.9 C) develops,  or as your caregiver suggests.  You have leaking of fluid from the vagina (birth canal). If leaking membranes are suspected, take your temperature and inform your caregiver of this when you call.  There is vaginal spotting or bleeding. Notify your caregiver of the amount and how many pads are used.  You develop a bad smelling vaginal discharge with a change in the color.  You continue to feel sick to your stomach (nauseated) and have no relief from remedies suggested. You vomit blood or coffee ground-like materials.  You lose more than 2 pounds of weight in 1 week.  You gain more than 2 pounds of weight in 1 week and you notice swelling of your face, hands, feet, or legs.  You gain 5 pounds or more in 1 week (even if you do not have swelling of your hands, face, legs, or feet).  You get exposed to [place] measles and have never had them.  You are exposed to fifth disease or chickenpox.  You develop belly (abdominal) pain. Round ligament discomfort is a common non-cancerous (benign) cause of abdominal pain in pregnancy. Your caregiver still must evaluate this.  You develop headache, fever, diarrhea, pain with urination, or shortness of breath.  You fall or are in a car accident or have any kind of trauma.  There is mental or physical violence in your home. Document Released: 07/18/2001 Document Revised: 04/17/2012 Document Reviewed: 01/19/2009 [facility] Patient Information 2014 [place], [place].  Gestational Diabetes Mellitus Gestational diabetes mellitus, often simply referred to as gestational diabetes, is a type of diabetes that some women develop during pregnancy. In gestational diabetes, the pancreas does not make enough insulin (a hormone), the cells are less responsive to the insulin that is made (insulin resistance), or both.Normally, insulin moves sugars from food  into the tissue cells. The tissue cells use the sugars for energy. The lack of insulin or the lack of normal response  to insulin causes excess sugars to build up in the blood instead of going into the tissue cells. As a result, high blood sugar (hyperglycemia) develops. The effect of high sugar (glucose) levels can cause many complications.  RISK FACTORS You have an increased chance of developing gestational diabetes if you have a family history of diabetes and also have one or more of the following risk factors:  A body mass index over 30 (obesity).  A previous pregnancy with gestational diabetes.  An older age at the time of pregnancy. If blood glucose levels are kept in the normal range during pregnancy, women can have a healthy pregnancy. If your blood glucose levels are not well controlled, there may be risks to you, your unborn baby (fetus), your labor and delivery, or your newborn baby.  SYMPTOMS  If symptoms are experienced, they are much like symptoms you would normally expect during pregnancy. The symptoms of gestational diabetes include:   Increased thirst (polydipsia).  Increased urination (polyuria).  Increased urination during the night (nocturia).  Weight loss. This weight loss may be rapid.  Frequent, recurring infections.  Tiredness (fatigue).  Weakness.  Vision changes, such as blurred vision.  Fruity smell to your breath.  Abdominal pain. DIAGNOSIS Diabetes is diagnosed when blood glucose levels are increased. Your blood glucose level may be checked by one or more of the following blood tests:  A fasting blood glucose test. You will not be allowed to eat for at least 8 hours before a blood sample is taken.  A random blood glucose test. Your blood glucose is checked at any time of the day regardless of when you ate.  A hemoglobin A1c blood glucose test. A hemoglobin A1c test provides information about blood glucose control over the previous 3 months.  An oral glucose tolerance test (OGTT). Your blood glucose is measured after you have not eaten (fasted) for 1 3 hours and  then after you drink a glucose-containing beverage. Since the hormones that cause insulin resistance are highest at about 24 28 weeks of a pregnancy, an OGTT is usually performed during that time. If you have risk factors for gestational diabetes, your caregiver may test you for gestational diabetes earlier than 24 weeks of pregnancy. TREATMENT   You will need to take diabetes medicine or insulin daily to keep blood glucose levels in the desired range.  You will need to match insulin dosing with exercise and healthy food choices. The treatment goal is to maintain the before meal (preprandial), bedtime, and overnight blood glucose level at 60 99 mg/dL during pregnancy. The treatment goal is to further maintain peak after meal blood sugar (postprandial glucose) level at 100 140 mg/dL.  HOME CARE INSTRUCTIONS   Have your hemoglobin A1c level checked twice a year.  Perform daily blood glucose monitoring as directed by your caregiver. It is common to perform frequent blood glucose monitoring.  Monitor urine ketones when you are ill and as directed by your caregiver.  Take your diabetes medicine and insulin as directed by your caregiver to maintain your blood glucose level in the desired range.  Never run out of diabetes medicine or insulin. It is needed every day.  Adjust insulin based on your intake of carbohydrates. Carbohydrates can raise blood glucose levels but need to be included in your diet. Carbohydrates provide vitamins, minerals, and fiber which are an  essential part of a healthy diet. Carbohydrates are found in fruits, vegetables, whole grains, dairy products, legumes, and foods containing added sugars.    Eat healthy foods. Alternate 3 meals with 3 snacks.  Maintain a healthy weight gain. The usual total expected weight gain varies according to your prepregnancy body mass index (BMI).  Carry a medical alert card or wear your medical alert jewelry.  Carry a 15 gram carbohydrate  snack with you at all times to treat low blood glucose (hypoglycemia). Some examples of 15 gram carbohydrate snacks include:  Glucose tablets, 3 or 4   Glucose gel, 15 gram tube  Raisins, 2 tablespoons (24 g)  Jelly beans, 6  Animal crackers, 8  Fruit juice, regular soda, or low fat milk, 4 ounces (120 mL)  Gummy treats, 9    Recognize hypoglycemia. Hypoglycemia during pregnancy occurs with blood glucose levels of 60 mg/dL and below. The risk for hypoglycemia increases when fasting or skipping meals, during or after intense exercise, and during sleep. Hypoglycemia symptoms can include:  Tremors or shakes.  Decreased ability to concentrate.  Sweating.  Increased heart rate.  Headache.  Dry mouth.  Hunger.  Irritability.  Anxiety.  Restless sleep.  Altered speech or coordination.  Confusion.  Treat hypoglycemia promptly. If you are alert and able to safely swallow, follow the 15:15 rule:  Take 15 20 grams of rapid-acting glucose or carbohydrate. Rapid-acting options include glucose gel, glucose tablets, or 4 ounces (120 mL) of fruit juice, regular soda, or low fat milk.  Check your blood glucose level 15 minutes after taking the glucose.   Take 15 20 grams more of glucose if the repeat blood glucose level is still 70 mg/dL or below.  Eat a meal or snack within 1 hour once blood glucose levels return to normal.  Be alert to polyuria and polydipsia which are early signs of hyperglycemia. An early awareness of hyperglycemia allows for prompt treatment. Treat hyperglycemia as directed by your caregiver.  Engage in at least 30 minutes of physical activity a day or as directed by your caregiver. Ten minutes of physical activity timed 30 minutes after each meal is encouraged to control postprandial blood glucose levels.  Adjust your insulin dosing and food intake as needed if you start a new exercise or sport.  Follow your sick day plan at any time you are unable  to eat or drink as usual.  Avoid tobacco and alcohol use.  Follow up with your caregiver regularly.  Follow the advice of your caregiver regarding your prenatal and post-delivery (postpartum) appointments, meal planning, exercise, medicines, vitamins, blood tests, other medical tests, and physical activities.  Perform daily skin and foot care. Examine your skin and feet daily for cuts, bruises, redness, nail problems, bleeding, blisters, or sores.  Brush your teeth and gums at least twice a day and floss at least once a day. Follow up with your dentist regularly.  Schedule an eye exam during the first trimester of your pregnancy or as directed by your caregiver.  Share your diabetes management plan with your workplace or school.  Stay up-to-date with immunizations.  Learn to manage stress.  Obtain ongoing diabetes education and support as needed. SEEK MEDICAL CARE IF:   You are unable to eat food or drink fluids for more than 6 hours.  You have nausea and vomiting for more than 6 hours.  You have a blood glucose level of 200 mg/dL and you have ketones in your urine.  There is  a change in mental status.  You develop vision problems.  You have a persistent headache.  You have upper abdominal pain or discomfort.  You develop an additional serious illness.  You have diarrhea for more than 6 hours.  You have been sick or have had a fever for a couple of days and are not getting better. SEEK IMMEDIATE MEDICAL CARE IF:   You have difficulty breathing.  You no longer feel the baby moving.  You are bleeding or have discharge from your vagina.  You start having premature contractions or labor. MAKE SURE YOU:  Understand these instructions.  Will watch your condition.  Will get help right away if you are not doing well or get worse. Document Released: 10/30/2000 Document Revised: 04/17/2012 Document Reviewed: 02/20/2012 [facility] Patient Information 2014 [place],  [place].

## 2013-01-21 LAB — GC/CHLAMYDIA PROBE AMP: GC Probe RNA: NEGATIVE

## 2013-01-23 ENCOUNTER — Encounter: Payer: Self-pay | Admitting: Family Medicine

## 2013-01-23 LAB — CREATININE CLEARANCE, URINE, 24 HOUR
Creatinine, Urine: 116.4 mg/dL
Creatinine: 0.48 mg/dL — ABNORMAL LOW (ref 0.50–1.10)

## 2013-02-03 ENCOUNTER — Encounter: Payer: Medicaid Other | Admitting: Advanced Practice Midwife

## 2013-02-17 ENCOUNTER — Ambulatory Visit (INDEPENDENT_AMBULATORY_CARE_PROVIDER_SITE_OTHER): Payer: Medicaid Other | Admitting: Obstetrics and Gynecology

## 2013-02-17 VITALS — BP 118/74 | Wt 290.5 lb

## 2013-02-17 DIAGNOSIS — O24911 Unspecified diabetes mellitus in pregnancy, first trimester: Secondary | ICD-10-CM

## 2013-02-17 DIAGNOSIS — O24919 Unspecified diabetes mellitus in pregnancy, unspecified trimester: Secondary | ICD-10-CM

## 2013-02-17 DIAGNOSIS — B86 Scabies: Secondary | ICD-10-CM

## 2013-02-17 LAB — POCT URINALYSIS DIP (DEVICE)
Glucose, UA: NEGATIVE mg/dL
Protein, ur: NEGATIVE mg/dL
Specific Gravity, Urine: 1.025 (ref 1.005–1.030)

## 2013-02-17 MED ORDER — PERMETHRIN 1 % EX LOTN: TOPICAL_LOTION | Freq: Once | CUTANEOUS | Status: DC

## 2013-02-17 NOTE — Progress Notes (Signed)
Pulse: 99 Has a rash that is very itchy all over her body. Started 1 week ago. No new soaps, detergents, foods, etc. No one else at home has any symptoms.

## 2013-02-17 NOTE — Progress Notes (Signed)
Papular rash trunk interdigital, arm; some fine papules in lines;pruritic. Babysat with child with scabies. Wants integrated screen.  Not checking CBGs x occasionally and no log book. DNKA last wk. , reports F 90-130. On Glyburide, metformin as dir. Random CBG: See nutritionist.

## 2013-02-17 NOTE — Progress Notes (Signed)
Nutrition note: 1st visit Pt has h/o obesity & type 2 DM. Pt has gained 5.5# @ 13w, which is slightly > expected so far. Pt reports eating 2 meals & 2-3 snacks/d. Pt reports having nausea and heartburn occ. NKFA. Pt checks BS ~2x/d and stated that her fasting is usually ~120-130. Pt is taking PNV. Pt received verbal & written education on diabetes diet during pregnancy. Encouraged pt to check BS 4x/d as indicated by doctor. Discussed wt gain goals of 11-20# or 0.5#/wk. Pt agrees to follow diabetes diet with 3 meals & 3 snacks/d with proper CHO/ protein combination. Pt has WIC & plans to BF. F/u in 4-6 wks Blondell Reveal, MS, RD, LDN

## 2013-02-17 NOTE — Patient Instructions (Signed)
Scabies  Scabies are small bugs (mites) that burrow under the skin and cause red bumps and severe itching. These bugs can only be seen with a microscope. Scabies are highly contagious. They can spread easily from person to person by direct contact. They are also spread through sharing clothing or linens that have the scabies mites living in them. It is not unusual for an entire family to become infected through shared towels, clothing, or bedding.   HOME CARE INSTRUCTIONS   · Your caregiver may prescribe a cream or lotion to kill the mites. If cream is prescribed, massage the cream into the entire body from the neck to the bottom of both feet. Also massage the cream into the scalp and face if your child is less than 1 year old. Avoid the eyes and mouth. Do not wash your hands after application.  · Leave the cream on for 8 to 12 hours. Your child should bathe or shower after the 8 to 12 hour application period. Sometimes it is helpful to apply the cream to your child right before bedtime.  · One treatment is usually effective and will eliminate approximately 95% of infestations. For severe cases, your caregiver may decide to repeat the treatment in 1 week. Everyone in your household should be treated with one application of the cream.  · New rashes or burrows should not appear within 24 to 48 hours after successful treatment. However, the itching and rash may last for 2 to 4 weeks after successful treatment. Your caregiver may prescribe a medicine to help with the itching or to help the rash go away more quickly.  · Scabies can live on clothing or linens for up to 3 days. All of your child's recently used clothing, towels, stuffed toys, and bed linens should be washed in hot water and then dried in a dryer for at least 20 minutes on high heat. Items that cannot be washed should be enclosed in a plastic bag for at least 3 days.  · To help relieve itching, bathe your child in a cool bath or apply cool washcloths to the  affected areas.  · Your child may return to school after treatment with the prescribed cream.  SEEK MEDICAL CARE IF:   · The itching persists longer than 4 weeks after treatment.  · The rash spreads or becomes infected. Signs of infection include red blisters or yellow-tan crust.  Document Released: 07/24/2005 Document Revised: 10/16/2011 Document Reviewed: 12/02/2008  [employer]® Patient Information ©2014 [employer].

## 2013-02-17 NOTE — Progress Notes (Signed)
Maternal Fetal Medicine appointment for Nucal scheduled for 02/21/13 at 11:30 am.

## 2013-02-21 ENCOUNTER — Other Ambulatory Visit: Payer: Self-pay

## 2013-02-21 ENCOUNTER — Ambulatory Visit (HOSPITAL_COMMUNITY)
Admission: RE | Admit: 2013-02-21 | Discharge: 2013-02-21 | Disposition: A | Discharge status: Home / Self Care | Payer: Medicaid Other | Source: Ambulatory Visit | Attending: Obstetrics and Gynecology | Admitting: Obstetrics and Gynecology

## 2013-02-21 DIAGNOSIS — O24911 Unspecified diabetes mellitus in pregnancy, first trimester: Secondary | ICD-10-CM

## 2013-02-21 DIAGNOSIS — O99330 Smoking (tobacco) complicating pregnancy, unspecified trimester: Secondary | ICD-10-CM | POA: Insufficient documentation

## 2013-02-21 DIAGNOSIS — B86 Scabies: Secondary | ICD-10-CM

## 2013-02-21 DIAGNOSIS — E669 Obesity, unspecified: Secondary | ICD-10-CM | POA: Insufficient documentation

## 2013-02-21 DIAGNOSIS — O24919 Unspecified diabetes mellitus in pregnancy, unspecified trimester: Secondary | ICD-10-CM | POA: Insufficient documentation

## 2013-02-27 ENCOUNTER — Encounter: Payer: Self-pay | Admitting: *Deleted

## 2013-03-03 ENCOUNTER — Ambulatory Visit (INDEPENDENT_AMBULATORY_CARE_PROVIDER_SITE_OTHER): Payer: Medicaid Other | Admitting: Obstetrics and Gynecology

## 2013-03-03 ENCOUNTER — Telehealth: Payer: Self-pay

## 2013-03-03 VITALS — BP 123/78 | Temp 97.0°F | Wt 290.2 lb

## 2013-03-03 DIAGNOSIS — L299 Pruritus, unspecified: Secondary | ICD-10-CM

## 2013-03-03 DIAGNOSIS — Z3492 Encounter for supervision of normal pregnancy, unspecified, second trimester: Secondary | ICD-10-CM

## 2013-03-03 DIAGNOSIS — O24919 Unspecified diabetes mellitus in pregnancy, unspecified trimester: Secondary | ICD-10-CM

## 2013-03-03 LAB — POCT URINALYSIS DIP (DEVICE)
Bilirubin Urine: NEGATIVE
Hgb urine dipstick: NEGATIVE
Ketones, ur: 40 mg/dL — AB
Protein, ur: NEGATIVE mg/dL
pH: 7 (ref 5.0–8.0)

## 2013-03-03 MED ORDER — GLUCOSE BLOOD VI STRP: ORAL_STRIP | Status: DC

## 2013-03-03 MED ORDER — ACCU-CHEK SOFTCLIX LANCET DEV MISC: Status: DC

## 2013-03-03 MED ORDER — TRIAMCINOLONE ACETONIDE 0.025 % EX OINT: TOPICAL_OINTMENT | Freq: Two times a day (BID) | CUTANEOUS | Status: DC

## 2013-03-03 NOTE — Progress Notes (Signed)
Pulse- 104 Patient report a lot of itching and states nothing she has tried helps

## 2013-03-03 NOTE — Patient Instructions (Signed)
Pregnancy - Second Trimester The second trimester of pregnancy (3 to 6 months) is a period of rapid growth for you and your baby. At the end of the sixth month, your baby is about 9 inches long and weighs 1 1/2 pounds. You will begin to feel the baby move between 18 and 20 weeks of the pregnancy. This is called quickening. Weight gain is faster. A clear fluid (colostrum) may leak out of your breasts. You may feel small contractions of the womb (uterus). This is known as false labor or Braxton-Hicks contractions. This is like a practice for labor when the baby is ready to be born. Usually, the problems with morning sickness have usually passed by the end of your first trimester. Some women develop small dark blotches (called cholasma, mask of pregnancy) on their face that usually goes away after the baby is born. Exposure to the sun makes the blotches worse. Acne may also develop in some pregnant women and pregnant women who have acne, may find that it goes away. PRENATAL EXAMS  Blood work may continue to be done during prenatal exams. These tests are done to check on your health and the probable health of your baby. Blood work is used to follow your blood levels (hemoglobin). Anemia (low hemoglobin) is common during pregnancy. Iron and vitamins are given to help prevent this. You will also be checked for diabetes between 24 and 28 weeks of the pregnancy. Some of the previous blood tests may be repeated.  The size of the uterus is measured during each visit. This is to make sure that the baby is continuing to grow properly according to the dates of the pregnancy.  Your blood pressure is checked every prenatal visit. This is to make sure you are not getting toxemia.  Your urine is checked to make sure you do not have an infection, diabetes or protein in the urine.  Your weight is checked often to make sure gains are happening at the suggested rate. This is to ensure that both you and your baby are growing  normally.  Sometimes, an ultrasound is performed to confirm the proper growth and development of the baby. This is a test which bounces harmless sound waves off the baby so your caregiver can more accurately determine due dates. Sometimes, a test is done on the amniotic fluid surrounding the baby. This test is called an amniocentesis. The amniotic fluid is obtained by sticking a needle into the belly (abdomen). This is done to check the chromosomes in instances where there is a concern about possible genetic problems with the baby. It is also sometimes done near the end of pregnancy if an early delivery is required. In this case, it is done to help make sure the baby's lungs are mature enough for the baby to live outside of the womb. CHANGES OCCURING IN THE SECOND TRIMESTER OF PREGNANCY Your body goes through many changes during pregnancy. They vary from person to person. Talk to your caregiver about changes you notice that you are concerned about.  During the second trimester, you will likely have an increase in your appetite. It is normal to have cravings for certain foods. This varies from person to person and pregnancy to pregnancy.  Your lower abdomen will begin to bulge.  You may have to urinate more often because the uterus and baby are pressing on your bladder. It is also common to get more bladder infections during pregnancy. You can help this by drinking lots of fluids  and emptying your bladder before and after intercourse.  You may begin to get stretch marks on your hips, abdomen, and breasts. These are normal changes in the body during pregnancy. There are no exercises or medicines to take that prevent this change.  You may begin to develop swollen and bulging veins (varicose veins) in your legs. Wearing support hose, elevating your feet for 15 minutes, 3 to 4 times a day and limiting salt in your diet helps lessen the problem.  Heartburn may develop as the uterus grows and pushes up  against the stomach. Antacids recommended by your caregiver helps with this problem. Also, eating smaller meals 4 to 5 times a day helps.  Constipation can be treated with a stool softener or adding bulk to your diet. Drinking lots of fluids, and eating vegetables, fruits, and whole grains are helpful.  Exercising is also helpful. If you have been very active up until your pregnancy, most of these activities can be continued during your pregnancy. If you have been less active, it is helpful to start an exercise program such as walking.  Hemorrhoids may develop at the end of the second trimester. Warm sitz baths and hemorrhoid cream recommended by your caregiver helps hemorrhoid problems.  Backaches may develop during this time of your pregnancy. Avoid heavy lifting, wear low heal shoes, and practice good posture to help with backache problems.  Some pregnant women develop tingling and numbness of their hand and fingers because of swelling and tightening of ligaments in the wrist (carpel tunnel syndrome). This goes away after the baby is born.  As your breasts enlarge, you may have to get a bigger bra. Get a comfortable, cotton, support bra. Do not get a nursing bra until the last month of the pregnancy if you will be nursing the baby.  You may get a dark line from your belly button to the pubic area called the linea nigra.  You may develop rosy cheeks because of increase blood flow to the face.  You may develop spider looking lines of the face, neck, arms, and chest. These go away after the baby is born. HOME CARE INSTRUCTIONS   It is extremely important to avoid all smoking, herbs, alcohol, and unprescribed drugs during your pregnancy. These chemicals affect the formation and growth of the baby. Avoid these chemicals throughout the pregnancy to ensure the delivery of a healthy infant.  Most of your home care instructions are the same as suggested for the first trimester of your pregnancy.  Keep your caregiver's appointments. Follow your caregiver's instructions regarding medicine use, exercise, and diet.  During pregnancy, you are providing food for you and your baby. Continue to eat regular, well-balanced meals. Choose foods such as meat, fish, milk and other low fat dairy products, vegetables, fruits, and whole-grain breads and cereals. Your caregiver will tell you of the ideal weight gain.  A physical sexual relationship may be continued up until near the end of pregnancy if there are no other problems. Problems could include early (premature) leaking of amniotic fluid from the membranes, vaginal bleeding, abdominal pain, or other medical or pregnancy problems.  Exercise regularly if there are no restrictions. Check with your caregiver if you are unsure of the safety of some of your exercises. The greatest weight gain will occur in the last 2 trimesters of pregnancy. Exercise will help you:  Control your weight.  Get you in shape for labor and delivery.  Lose weight after you have the baby.  Wear  a good support or jogging bra for breast tenderness during pregnancy. This may help if worn during sleep. Pads or tissues may be used in the bra if you are leaking colostrum.  Do not use hot tubs, steam rooms or saunas throughout the pregnancy.  Wear your seat belt at all times when driving. This protects you and your baby if you are in an accident.  Avoid raw meat, uncooked cheese, cat litter boxes, and soil used by cats. These carry germs that can cause birth defects in the baby.  The second trimester is also a good time to visit your dentist for your dental health if this has not been done yet. Getting your teeth cleaned is okay. Use a soft toothbrush. Brush gently during pregnancy.  It is easier to leak urine during pregnancy. Tightening up and strengthening the pelvic muscles will help with this problem. Practice stopping your urination while you are going to the bathroom.  These are the same muscles you need to strengthen. It is also the muscles you would use as if you were trying to stop from passing gas. You can practice tightening these muscles up 10 times a set and repeating this about 3 times per day. Once you know what muscles to tighten up, do not perform these exercises during urination. It is more likely to contribute to an infection by backing up the urine.  Ask for help if you have financial, counseling, or nutritional needs during pregnancy. Your caregiver will be able to offer counseling for these needs as well as refer you for other special needs.  Your skin may become oily. If so, wash your face with mild soap, use non-greasy moisturizer and oil or cream based makeup. MEDICINES AND DRUG USE IN PREGNANCY  Take prenatal vitamins as directed. The vitamin should contain 1 milligram of folic acid. Keep all vitamins out of reach of children. Only a couple vitamins or tablets containing iron may be fatal to a baby or young child when ingested.  Avoid use of all medicines, including herbs, over-the-counter medicines, not prescribed or suggested by your caregiver. Only take over-the-counter or prescription medicines for pain, discomfort, or fever as directed by your caregiver. Do not use aspirin.  Let your caregiver also know about herbs you may be using.  Alcohol is related to a number of birth defects. This includes fetal alcohol syndrome. All alcohol, in any form, should be avoided completely. Smoking will cause low birth rate and premature babies.  Street or illegal drugs are very harmful to the baby. They are absolutely forbidden. A baby born to an addicted mother will be addicted at birth. The baby will go through the same withdrawal an adult does. SEEK MEDICAL CARE IF:  You have any concerns or worries during your pregnancy. It is better to call with your questions if you feel they cannot wait, rather than worry about them. SEEK IMMEDIATE MEDICAL CARE  IF:   An unexplained oral temperature above 102 F (38.9 C) develops, or as your caregiver suggests.  You have leaking of fluid from the vagina (birth canal). If leaking membranes are suspected, take your temperature and tell your caregiver of this when you call.  There is vaginal spotting, bleeding, or passing clots. Tell your caregiver of the amount and how many pads are used. Light spotting in pregnancy is common, especially following intercourse.  You develop a bad smelling vaginal discharge with a change in the color from clear to white.  You continue to feel  sick to your stomach (nauseated) and have no relief from remedies suggested. You vomit blood or coffee ground-like materials.  You lose more than 2 pounds of weight or gain more than 2 pounds of weight over 1 week, or as suggested by your caregiver.  You notice swelling of your face, hands, feet, or legs.  You get exposed to [place] measles and have never had them.  You are exposed to fifth disease or chickenpox.  You develop belly (abdominal) pain. Round ligament discomfort is a common non-cancerous (benign) cause of abdominal pain in pregnancy. Your caregiver still must evaluate you.  You develop a bad headache that does not go away.  You develop fever, diarrhea, pain with urination, or shortness of breath.  You develop visual problems, blurry, or double vision.  You fall or are in a car accident or any kind of trauma.  There is mental or physical violence at home. Document Released: 07/18/2001 Document Revised: 04/17/2012 Document Reviewed: 01/20/2009 [facility] Patient Information 2014 [place], [place]. Type 1 or Type 2 Diabetes Mellitus During Pregnancy Diabetes mellitus, often simply referred to as diabetes, is a long-term (chronic) disease. Type 1 diabetes occurs when the islet cells in the pancreas that make insulin (a hormone) are destroyed and can no longer make insulin. Type 2 diabetes occurs when the pancreas  does not make enough insulin, the cells are less responsive to the insulin that is made (insulin resistance), or both. Insulin is needed to move sugars from food into the tissue cells. The tissue cells use the sugars for energy. The lack of insulin or the lack of normal response to insulin causes excess sugars to build up in the blood instead of going into the tissue cells. As a result, high blood sugar (hyperglycemia) develops.  If blood glucose levels are kept in the normal range both before and during pregnancy, women can have a healthy pregnancy. If your blood glucose levels are not well controlled, there may be risks to you, your unborn baby (fetus), your labor and delivery, or your newborn baby.  RISK FACTORS  You are predisposed to developing type 1 diabetes if someone in your family has diabetes and you are exposed to certain environmental triggers.  You have an increased chance of developing type 2 diabetes if you have a family history of diabetes and also have one or more of the following risk factors:  Being overweight.  Having an inactive lifestyle.  Having a history of consistently eating high-calorie foods. SYMPTOMS The symptoms of diabetes include:  Increased thirst (polydipsia).  Increased urination (polyuria).  Increased urination during the night (nocturia).  Weight loss. This weight loss may be rapid.  Frequent, recurring infections.  Tiredness (fatigue).  Weakness.  Vision changes, such as blurred vision.  Fruity smell to your breath.  Abdominal pain.  Nausea or vomiting. DIAGNOSIS  Diabetes is diagnosed when blood glucose levels are increased. Your blood glucose level may be checked by one or more of the following blood tests:  A fasting blood glucose test. You will not be allowed to eat for at least 8 hours before a blood sample is taken.  A random blood glucose test. Your blood glucose is checked at any time of the day regardless of when you  ate.  A hemoglobin A1c blood glucose test. A hemoglobin A1c test provides information about blood glucose control over the previous 3 months.  An oral glucose tolerance test (OGTT). Your blood glucose is measured after you have not eaten (fasted) for  1 3 hours and then after you drink a glucose-containing beverage. An OGTT is usually performed during weeks 24 28 of your pregnancy. TREATMENT   You will need to take diabetes medicine or insulin daily to keep blood glucose levels in the desired range.  You will need to match insulin dosing with exercise and healthy food choices. The treatment goal is to maintain the before-meal (preprandial), bedtime, and overnight blood glucose level at 60 99 mg/dL during pregnancy. The treatment goal is to further maintain the peak after-meal blood sugar (postprandial glucose) level at 100 140 mg/dL.  HOME CARE INSTRUCTIONS   Have your hemoglobin A1c level checked twice a year.  Perform daily blood glucose monitoring as directed by your caregiver. It is common to perform frequent blood glucose monitoring.  Monitor urine ketones when you are ill and as directed by your caregiver.  Take your diabetes medicine and insulin as directed by your caregiver to maintain your blood glucose level in the desired range.  Never run out of diabetes medicine or insulin. It is needed every day.  Adjust insulin based on your intake of carbohydrates. Carbohydrates can raise blood glucose levels but need to be included in your diet. Carbohydrates provide vitamins, minerals, and fiber, which are an essential part of a healthy diet. Carbohydrates are found in fruits, vegetables, whole grains, dairy products, legumes, and foods containing added sugars.  Eat healthy foods. Alternate 3 meals with 3 snacks.  Maintain a healthy weight gain. The usual total expected weight gain varies according to your prepregnancy body mass index (BMI).  Carry a medical alert card or wear  medical alert jewelry.  Carry a 15 gram carbohydrate snack with you at all times to treat low blood sugar (hypoglycemia). Some examples of 15 gram carbohydrate snacks include:  Glucose tablets, 3 or 4.  Glucose gel, 15 gram tube.  Raisins, 2 tablespoons (24 grams).  Jelly beans, 6.  Animal crackers, 8.  Fruit juice, regular soda, or low fat milk, 4 ounces (120 mL).  Gummy treats, 9.  Recognize hypoglycemia. Hypoglycemia during pregnancy occurs with blood glucose levels of 60 mg/dL and below. The risk for hypoglycemia increases when fasting or skipping meals, during or after intense exercise, and during sleep. Hypoglycemia symptoms can include:  Tremors or shakes.  Decreased ability to concentrate.  Sweating.  Increased heart rate.  Headache.  Dry mouth.  Hunger.  Irritability.  Anxiety.  Restless sleep.  Altered speech or coordination.  Confusion.  Treat hypoglycemia promptly. If you are alert and able to safely swallow, follow the 15:15 rule:  Take 15 20 grams of rapid-acting glucose or carbohydrate. Rapid-acting options include glucose gel, glucose tablets, or 4 ounces (120 mL) of fruit juice, regular soda, or low-fat milk.  Check your blood glucose level 15 minutes after taking the glucose.  Take 15 20 grams more of glucose if the repeat blood glucose level is still 70 mg/dL or below.  Eat a meal or snack within 1 hour once blood glucose levels return to normal.  Engage in at least 30 minutes of physical activity a day or as directed by your caregiver. Ten minutes of physical activity timed 30 minutes after each meal is encouraged to control postprandial blood glucose levels.   Be alert to polyuria and polydipsia, which are early signs of hyperglycemia. An early awareness of hyperglycemia allows for prompt treatment. Treat hyperglycemia as directed by your caregiver.  Adjust your insulin dosing and food intake as needed if you start a  new exercise or  sport.  Follow your sick day plan at any time you are unable to eat or drink as usual.  Avoid tobacco and alcohol use.  Follow up with your caregiver regularly.  Follow the advice of your caregiver regarding your prenatal and post-delivery (postpartum) appointments, meal planning, exercise, medicines, vitamins, blood tests, other medical tests, and physical activities.  Continue daily skin and foot care. Examine your skin and feet daily for cuts, bruises, redness, nail problems, bleeding, blisters, or sores. A foot exam by a caregiver should be done annually.  Brush your teeth and gums at least twice a day and floss at least once a day. Follow up with your dentist regularly.  Schedule an eye exam during the first trimester of your pregnancy or as directed by your caregiver.  Share your diabetes management plan with your workplace or school.  Stay up-to-date with immunizations.  Learn to manage stress.  Obtain ongoing diabetes education and support as needed. SEEK MEDICAL CARE IF:   You are unable to eat food or drink fluids for more than 6 hours.  You have nausea and vomiting for more than 6 hours.  You have a blood glucose level of 200 mg/dL and you have ketones in your urine.  There is a change in mental status.  You develop vision problems.  You have a persistent headache.  You have upper abdominal pain or discomfort.  You develop an additional serious illness.  You have diarrhea for more than 6 hours.  You have been sick or have had a fever for a couple of days and are not getting better. SEEK IMMEDIATE MEDICAL CARE IF:  You have difficulty breathing.  You no longer feel the baby moving.  You are bleeding or have discharge from your vagina.  You start having premature contractions or labor. MAKE SURE YOU:  Understand these instructions.  Will watch your condition.  Will get help right away if you are not doing well or get worse. Document Released:  04/17/2012 Document Revised: 07/10/2012 Document Reviewed: 04/17/2012 [facility] Patient Information 2014 [place], [place].

## 2013-03-03 NOTE — Progress Notes (Addendum)
Ultrasound scheduled for 03/24/13 at 9:30 am.

## 2013-03-03 NOTE — Telephone Encounter (Signed)
Pt called and stated that she needed more lancets and strips her pharmacy is saying she does not have enough. Called pt and pt informed me what is above and I reordered lancets/strips due to pt is now checking blood sugars qid.  I advised pt when she comes in on her appt with Dewayne Hatch on 03/10/13 to please let her know about her lancet pen being broken and that Dewayne Hatch would be able to help her get another one. Pt stated understanding.

## 2013-03-03 NOTE — Progress Notes (Signed)
Krystal [name] is a 27 y.o. G2P1001 at [redacted]w[redacted]d by R=6 presents for ROB  No loF no Vb DM: Does not have log but monitor has numerous values above goal. Will send to Diabetic nurse. Reports taking meds as Rx'd. Will need medication adjustment.  Endorses itching for last 4 weeks. Treated for scabies with no improvement. Trialing hydrocortisone and benadryl. Sleeps with benadryl no improvemetn otherwise.  PE: Diffuse papular erythematous rashon post arms and some on abdomen. No lesions on hands or feet but reporst occasionally itchy.  Itching: most consistent with dermatitis. Will trial topical steroid (triamcinalone for 3 weeks) if no improvement will draw labs and refer to dermatology.   Discussed with Patient:  - Reviewed genetics screen Visual merchandiser screen ) ordered US   - Patient plans on breast/ bottle feeding. - Routine precautions discussed (depression, infection s/s).   Patient provided with all pertinent phone numbers for emergencies. - RTC for any VB, regular, painful cramps/ctxs occurring at a rate of >2/10 min, fever (100.5 or higher), n/v/d, any pain that is unresolving or worsening. - RTC in 4 weeks for next appt.  Problems: Patient Active Problem List   Diagnosis Date Noted  . Supervision of high-risk pregnancy 01/20/2013  . H/O shoulder dystocia in prior pregnancy, currently pregnant 01/20/2013  . Diabetes mellitus, antepartum 12/27/2012  . GERD (gastroesophageal reflux disease) 08/30/2012  . HIDRADENITIS SUPPURATIVA 03/17/2009  . ECZEMA, ATOPIC 08/26/2007  . DEPRESSION, MILD 05/08/2007  . DIABETES MELLITUS II, UNCOMPLICATED 10/04/2006  . OBESITY, NOS 10/04/2006  . TOBACCO DEPENDENCE 10/04/2006  . MIGRAINE, UNSPEC., W/O INTRACTABLE MIGRAINE 10/04/2006    To Do: 1. 20 week anatomy scan ordered.  Patient to schedule.  [ ]  Vaccines: Flu:  Tdap:  [ ]  BCM:   Edu: [x ] PTL precautions; [ ]  BF class; [ ]  childbirth class; [ ]   BF counseling

## 2013-03-10 ENCOUNTER — Other Ambulatory Visit: Payer: Medicaid Other

## 2013-03-17 ENCOUNTER — Other Ambulatory Visit: Payer: Medicaid Other

## 2013-03-17 ENCOUNTER — Encounter: Payer: Medicaid Other | Attending: Obstetrics and Gynecology | Admitting: *Deleted

## 2013-03-17 DIAGNOSIS — O24919 Unspecified diabetes mellitus in pregnancy, unspecified trimester: Secondary | ICD-10-CM | POA: Insufficient documentation

## 2013-03-17 DIAGNOSIS — Z713 Dietary counseling and surveillance: Secondary | ICD-10-CM | POA: Insufficient documentation

## 2013-03-17 DIAGNOSIS — E119 Type 2 diabetes mellitus without complications: Secondary | ICD-10-CM | POA: Insufficient documentation

## 2013-03-17 MED ORDER — GLUCOSE BLOOD VI STRP: ORAL_STRIP | Status: AC

## 2013-03-17 NOTE — Progress Notes (Unsigned)
Pt here as pre-existing DM2 since age 27 years of age. Pt previously checking glucose only once a day as that is all her insurance would cover her. Pt now at 17 weeks with HgbA1C of 7.6% in June of 2014.  Pt here today to learn the difference in her schedule and her meals and snacks. Pt now to monitor 4 times daily, eat carb modified meal plan.  Pt given new accucheck Nano meter and prescription for strips. Pt ordered Glyburide 5 mg am and  10 mg @ HS and metformin. Pt given idea of meal plan and then to see RD. Thank you, Lenor Coffin, RN, Diabetes CNS [phone])

## 2013-03-18 ENCOUNTER — Encounter: Payer: Self-pay | Admitting: *Deleted

## 2013-03-20 ENCOUNTER — Inpatient Hospital Stay (HOSPITAL_COMMUNITY)
Admission: AD | Admit: 2013-03-20 | Discharge: 2013-03-20 | Disposition: A | Discharge status: Home / Self Care | Payer: Medicaid Other | Source: Ambulatory Visit | Attending: Family Medicine | Admitting: Family Medicine

## 2013-03-20 ENCOUNTER — Encounter (HOSPITAL_COMMUNITY): Payer: Self-pay | Admitting: *Deleted

## 2013-03-20 DIAGNOSIS — O0990 Supervision of high risk pregnancy, unspecified, unspecified trimester: Secondary | ICD-10-CM

## 2013-03-20 DIAGNOSIS — O24911 Unspecified diabetes mellitus in pregnancy, first trimester: Secondary | ICD-10-CM

## 2013-03-20 DIAGNOSIS — N390 Urinary tract infection, site not specified: Secondary | ICD-10-CM

## 2013-03-20 DIAGNOSIS — O99891 Other specified diseases and conditions complicating pregnancy: Secondary | ICD-10-CM | POA: Insufficient documentation

## 2013-03-20 DIAGNOSIS — N949 Unspecified condition associated with female genital organs and menstrual cycle: Secondary | ICD-10-CM

## 2013-03-20 DIAGNOSIS — O09291 Supervision of pregnancy with other poor reproductive or obstetric history, first trimester: Secondary | ICD-10-CM

## 2013-03-20 DIAGNOSIS — M549 Dorsalgia, unspecified: Secondary | ICD-10-CM

## 2013-03-20 DIAGNOSIS — Y92009 Unspecified place in unspecified non-institutional (private) residence as the place of occurrence of the external cause: Secondary | ICD-10-CM | POA: Insufficient documentation

## 2013-03-20 DIAGNOSIS — O2390 Unspecified genitourinary tract infection in pregnancy, unspecified trimester: Secondary | ICD-10-CM | POA: Insufficient documentation

## 2013-03-20 DIAGNOSIS — W010XXA Fall on same level from slipping, tripping and stumbling without subsequent striking against object, initial encounter: Secondary | ICD-10-CM | POA: Insufficient documentation

## 2013-03-20 DIAGNOSIS — R109 Unspecified abdominal pain: Secondary | ICD-10-CM

## 2013-03-20 LAB — URINALYSIS, ROUTINE W REFLEX MICROSCOPIC
Bilirubin Urine: NEGATIVE
Glucose, UA: 100 mg/dL — AB
Hgb urine dipstick: NEGATIVE
Specific Gravity, Urine: >1.03 — ABNORMAL HIGH (ref 1.005–1.030)

## 2013-03-20 LAB — URINE MICROSCOPIC-ADD ON

## 2013-03-20 MED ORDER — OXYCODONE-ACETAMINOPHEN 5-325 MG PO TABS: 1.0000 | ORAL_TABLET | Freq: Three times a day (TID) | ORAL | Status: DC | PRN

## 2013-03-20 MED ORDER — CEPHALEXIN 500 MG PO CAPS: 500.0000 mg | ORAL_CAPSULE | Freq: Three times a day (TID) | ORAL | Status: DC

## 2013-03-20 NOTE — MAU Note (Signed)
Pt reports she woke up with pain in her lower stomach. Cramping on and off. Stated she did fall backward last night onto her back but had no pain then. Denies vag bleeding or discharge

## 2013-03-20 NOTE — MAU Provider Note (Signed)
History     CSN: [id number]  Arrival date and time: 03/20/13 1410   First Provider Initiated Contact with Patient 03/20/13 1513      Chief Complaint  Patient presents with  . Abdominal Pain   HPI Krystal [name] is 27 y.o. G2P1001 [redacted]w[redacted]d weeks presenting with lower abdominal cramping that she woke up with the am.  Denies fever and chills.  She reports she tripped and fell back onto a table last night.  Did not hit her abdomen.  No pain at the time of accident but by 5am severe.  Took tylenol 3 hrs ago.  Denies vaginal bleeding or discharge.  She is a patient of the high risk clinic. Is diabetic, treated with oral med.  Saw diabetes counselor 3 days ago.     Past Medical History  Diagnosis Date  . Diabetes mellitus   . Depression bi-polar  . Obesity     Past Surgical History  Procedure Laterality Date  . No past surgeries    . Mouth surgery      Family History  Problem Relation Age of Onset  . Asthma Mother   . Diabetes Mother   . Alcohol abuse Mother     History  Substance Use Topics  . Smoking status: Current Every Day Smoker -- 0.25 packs/day for 6 years    Types: Cigarettes  . Smokeless tobacco: Never Used     Comment: trying to quitt  . Alcohol Use: No    Allergies: No Known Allergies  Prescriptions prior to admission  Medication Sig Dispense Refill  . acetaminophen (TYLENOL) 500 MG tablet Take 500 mg by mouth every 6 (six) hours as needed for pain (stomach pain).      Marland Kitchen diphenhydrAMINE (SOMINEX) 25 MG tablet Take 25 mg by mouth at bedtime as needed for sleep (itching).      Marland Kitchen glyBURIDE (DIABETA) 5 MG tablet TAKE 1 TABLET BY MOUTH IN THE MORNING AND 2 TABLETS BY MOUTH IN THE EVENING  90 tablet  11  . metFORMIN (GLUCOPHAGE) 1000 MG tablet Take 1 tablet (1,000 mg total) by mouth 2 (two) times daily with a meal.  30 tablet  11  . Prenatal Multivit-Min-Fe-FA (PRENATAL VITAMINS) 0.8 MG tablet Take 1 tablet by mouth daily.  30 tablet  11  . triamcinolone  (KENALOG) 0.025 % ointment Apply topically 2 (two) times daily.  80 g  0  . Blood Glucose Monitoring Suppl (ACCU-CHEK NANO SMARTVIEW) W/DEVICE KIT 1 kit by Does not apply route once. Please check blood sugars twice daily  1 kit  0  . glucose blood (ACCU-CHEK ACTIVE STRIPS) test strip Check Blood sugars four times a day  100 each  12  . Lancet Devices (ACCU-CHEK SOFTCLIX) lancets Check Blood sugar four times a day  1 each  11    ROS Physical Exam   Blood pressure 109/63, pulse 117, temperature 97.6 F (36.4 C), resp. rate 18, height 5\' 4"  (1.626 m), weight 290 lb 6.4 oz (131.725 kg), last menstrual period 10/08/2012.  Physical Exam  Constitutional: She is oriented to person, place, and time. She appears well-developed and well-nourished. No distress.  HENT:  Head: Normocephalic.  Neck: Normal range of motion.  Cardiovascular: Normal rate.   Respiratory: Effort normal.  GI: Soft. She exhibits no distension and no mass. There is no tenderness. There is no rebound and no guarding.  Genitourinary: There is no tenderness on the right labia. There is no tenderness on the left labia. Uterus  is enlarged (17-18 weeks size). Uterus is not tender. Cervix exhibits no discharge and no friability. No erythema or bleeding around the vagina. No vaginal discharge found.  Neurological: She is alert and oriented to person, place, and time.  Skin: Skin is warm and dry.  Psychiatric: She has a normal mood and affect. Her behavior is normal.   Results for orders placed during the hospital encounter of 03/20/13 (from the past 24 hour(s))  URINALYSIS, ROUTINE W REFLEX MICROSCOPIC     Status: Abnormal   Collection Time    03/20/13  2:45 PM      Result Value Range   Color, Urine YELLOW  YELLOW   APPearance CLEAR  CLEAR   Specific Gravity, Urine >1.030 (*) 1.005 - 1.030   pH 6.0  5.0 - 8.0   Glucose, UA 100 (*) NEGATIVE mg/dL   Hgb urine dipstick NEGATIVE  NEGATIVE   Bilirubin Urine NEGATIVE  NEGATIVE    Ketones, ur 15 (*) NEGATIVE mg/dL   Protein, ur NEGATIVE  NEGATIVE mg/dL   Urobilinogen, UA 0.2  0.0 - 1.0 mg/dL   Nitrite POSITIVE (*) NEGATIVE   Leukocytes, UA NEGATIVE  NEGATIVE  URINE MICROSCOPIC-ADD ON     Status: Abnormal   Collection Time    03/20/13  2:45 PM      Result Value Range   Squamous Epithelial / LPF FEW (*) RARE   WBC, UA 3-6  <3 WBC/hpf   Bacteria, UA MANY (*) RARE   Crystals CA OXALATE CRYSTALS (*) NEGATIVE   MAU Course  Procedures  Urine culture pending MDM   Assessment and Plan  A: Round Ligament Pain     Back pain secondary to injury--fell back on a table     Urinary Tract Infection     [redacted]w[redacted]d gestation  P:  Rx for Percocet 5/325mg  #5 with no refills      Heat to her back if needed      Rx for Keflex 500mg  tid X 7 days      Increase po fluids      Keep scheduled appointment for U/S and follow up OB care           KEY,EVE M 03/20/2013, 3:17 PM

## 2013-03-20 NOTE — MAU Provider Note (Signed)
Chart reviewed and agree with management and plan.  

## 2013-03-22 LAB — URINE CULTURE: Colony Count: >=100000

## 2013-03-24 ENCOUNTER — Ambulatory Visit (HOSPITAL_COMMUNITY)
Admission: RE | Admit: 2013-03-24 | Discharge: 2013-03-24 | Disposition: A | Discharge status: Home / Self Care | Payer: Medicaid Other | Source: Ambulatory Visit | Attending: Family Medicine | Admitting: Family Medicine

## 2013-03-24 DIAGNOSIS — E669 Obesity, unspecified: Secondary | ICD-10-CM | POA: Insufficient documentation

## 2013-03-24 DIAGNOSIS — Z3492 Encounter for supervision of normal pregnancy, unspecified, second trimester: Secondary | ICD-10-CM

## 2013-03-24 DIAGNOSIS — Z3689 Encounter for other specified antenatal screening: Secondary | ICD-10-CM | POA: Insufficient documentation

## 2013-03-24 DIAGNOSIS — O24919 Unspecified diabetes mellitus in pregnancy, unspecified trimester: Secondary | ICD-10-CM | POA: Insufficient documentation

## 2013-03-24 DIAGNOSIS — O99330 Smoking (tobacco) complicating pregnancy, unspecified trimester: Secondary | ICD-10-CM | POA: Insufficient documentation

## 2013-03-31 ENCOUNTER — Encounter: Payer: Medicaid Other | Admitting: Obstetrics and Gynecology

## 2013-04-03 ENCOUNTER — Inpatient Hospital Stay (HOSPITAL_COMMUNITY)
Admission: AD | Admit: 2013-04-03 | Discharge: 2013-04-03 | Disposition: A | Discharge status: Home / Self Care | Payer: Medicaid Other | Source: Ambulatory Visit | Attending: Obstetrics & Gynecology | Admitting: Obstetrics & Gynecology

## 2013-04-03 ENCOUNTER — Encounter (HOSPITAL_COMMUNITY): Payer: Self-pay | Admitting: *Deleted

## 2013-04-03 DIAGNOSIS — A088 Other specified intestinal infections: Secondary | ICD-10-CM | POA: Insufficient documentation

## 2013-04-03 DIAGNOSIS — R109 Unspecified abdominal pain: Secondary | ICD-10-CM | POA: Insufficient documentation

## 2013-04-03 DIAGNOSIS — R51 Headache: Secondary | ICD-10-CM | POA: Insufficient documentation

## 2013-04-03 DIAGNOSIS — K529 Noninfective gastroenteritis and colitis, unspecified: Secondary | ICD-10-CM

## 2013-04-03 DIAGNOSIS — N949 Unspecified condition associated with female genital organs and menstrual cycle: Secondary | ICD-10-CM

## 2013-04-03 DIAGNOSIS — M549 Dorsalgia, unspecified: Secondary | ICD-10-CM | POA: Insufficient documentation

## 2013-04-03 DIAGNOSIS — O99891 Other specified diseases and conditions complicating pregnancy: Secondary | ICD-10-CM | POA: Insufficient documentation

## 2013-04-03 LAB — URINALYSIS, ROUTINE W REFLEX MICROSCOPIC
Glucose, UA: NEGATIVE mg/dL
Hgb urine dipstick: NEGATIVE
Specific Gravity, Urine: 1.02 (ref 1.005–1.030)
pH: 7 (ref 5.0–8.0)

## 2013-04-03 LAB — CBC
HCT: 32.8 % — ABNORMAL LOW (ref 36.0–46.0)
MCV: 85.2 fL (ref 78.0–100.0)
Platelets: 203 10*3/uL (ref 150–400)
RBC: 3.85 MIL/uL — ABNORMAL LOW (ref 3.87–5.11)
WBC: 12.2 10*3/uL — ABNORMAL HIGH (ref 4.0–10.5)

## 2013-04-03 MED ORDER — OXYCODONE-ACETAMINOPHEN 5-325 MG PO TABS
1.0000 | ORAL_TABLET | Freq: Once | ORAL | Status: AC
  Administered 2013-04-03: 1 via ORAL
  Filled 2013-04-03: qty 1

## 2013-04-03 MED ORDER — PROMETHAZINE HCL 25 MG PO TABS: 25.0000 mg | ORAL_TABLET | Freq: Four times a day (QID) | ORAL | Status: DC | PRN

## 2013-04-03 MED ORDER — CYCLOBENZAPRINE HCL 10 MG PO TABS: 10.0000 mg | ORAL_TABLET | Freq: Three times a day (TID) | ORAL | Status: DC | PRN

## 2013-04-03 MED ORDER — CYCLOBENZAPRINE HCL 10 MG PO TABS
10.0000 mg | ORAL_TABLET | Freq: Once | ORAL | Status: AC
  Administered 2013-04-03: 10 mg via ORAL
  Filled 2013-04-03: qty 1

## 2013-04-03 MED ORDER — ONDANSETRON HCL 4 MG PO TABS: 4.0000 mg | ORAL_TABLET | Freq: Four times a day (QID) | ORAL | Status: DC

## 2013-04-03 MED ORDER — ONDANSETRON 8 MG PO TBDP
8.0000 mg | ORAL_TABLET | Freq: Once | ORAL | Status: AC
  Administered 2013-04-03: 8 mg via ORAL
  Filled 2013-04-03: qty 1

## 2013-04-03 NOTE — MAU Provider Note (Signed)
History     CSN: [id number]  Arrival date and time: 04/03/13 7829   First Provider Initiated Contact with Patient 04/03/13 1007      Chief Complaint  Patient presents with  . Abdominal Pain  . Nausea  . Headache   HPI Ms. Krystal [name] is a 27 y.o. G2P1001 at [redacted]w[redacted]d who presents to MAU today with complaint of abdominal pain and back pain. The patient states that the pain started yesterday. It is noted in her right lower back and also the inguinal fold of her right side. The pain is worse with ambulation and change or positions. The pain was relieve with a warm bath. She had a recent UTI that was treated with Keflex. She denies UTI symptoms, hematuria or flank pain today. She denies vaginal bleeding, discharge or fever today. She has had   OB History   Grav Para Term Preterm Abortions TAB SAB Ect Mult Living   2 1 1       1       Past Medical History  Diagnosis Date  . Diabetes mellitus   . Depression bi-polar  . Obesity     Past Surgical History  Procedure Laterality Date  . No past surgeries    . Mouth surgery      Family History  Problem Relation Age of Onset  . Asthma Mother   . Diabetes Mother   . Alcohol abuse Mother     History  Substance Use Topics  . Smoking status: Current Every Day Smoker -- 0.25 packs/day for 6 years    Types: Cigarettes  . Smokeless tobacco: Never Used     Comment: trying to quitt  . Alcohol Use: No    Allergies: No Known Allergies  No prescriptions prior to admission    Review of Systems  Constitutional: Negative for fever and malaise/fatigue.  Gastrointestinal: Positive for nausea, vomiting, abdominal pain and diarrhea. Negative for constipation.  Genitourinary: Negative for dysuria, urgency and frequency.       Neg - vaginal bleeding, discharge   Physical Exam   Blood pressure 114/67, pulse 100, temperature 98 F (36.7 C), temperature source Oral, resp. rate 20, height 5' 4.5" (1.638 m), weight 291 lb 6.4 oz  (132.178 kg), last menstrual period 10/08/2012.  Physical Exam  Constitutional: She is oriented to person, place, and time. She appears well-developed and well-nourished. No distress.  HENT:  Head: Normocephalic and atraumatic.  Cardiovascular: Normal rate, regular rhythm and normal heart sounds.   Respiratory: Effort normal and breath sounds normal. No respiratory distress.  GI: Soft. Bowel sounds are normal. She exhibits no distension and no mass. There is tenderness (very mild tenderness to palpation in the right inguinal fold). There is no rebound and no guarding.  Neurological: She is alert and oriented to person, place, and time.  Skin: Skin is warm and dry. No erythema.  Psychiatric: She has a normal mood and affect.   Results for orders placed during the hospital encounter of 04/03/13 (from the past 24 hour(s))  URINALYSIS, ROUTINE W REFLEX MICROSCOPIC     Status: Abnormal   Collection Time    04/03/13  9:45 AM      Result Value Range   Color, Urine YELLOW  YELLOW   APPearance CLEAR  CLEAR   Specific Gravity, Urine 1.020  1.005 - 1.030   pH 7.0  5.0 - 8.0   Glucose, UA NEGATIVE  NEGATIVE mg/dL   Hgb urine dipstick NEGATIVE  NEGATIVE  Bilirubin Urine NEGATIVE  NEGATIVE   Ketones, ur 40 (*) NEGATIVE mg/dL   Protein, ur NEGATIVE  NEGATIVE mg/dL   Urobilinogen, UA 0.2  0.0 - 1.0 mg/dL   Nitrite NEGATIVE  NEGATIVE   Leukocytes, UA NEGATIVE  NEGATIVE  CBC     Status: Abnormal   Collection Time    04/03/13 10:40 AM      Result Value Range   WBC 12.2 (*) 4.0 - 10.5 K/uL   RBC 3.85 (*) 3.87 - 5.11 MIL/uL   Hemoglobin 11.1 (*) 12.0 - 15.0 g/dL   HCT 16.1 (*) 09.6 - 04.5 %   MCV 85.2  78.0 - 100.0 fL   MCH 28.8  26.0 - 34.0 pg   MCHC 33.8  30.0 - 36.0 g/dL   RDW 40.9  81.1 - 91.4 %   Platelets 203  150 - 400 K/uL     MAU Course  Procedures None  MDM +FHTs UA, CBC today ODT Zofran given - patient states nausea has resolved and is able to take in PO while in  MAU Patient continues to have moderate abdominal pain over the area of the round ligament as well as headache rated at 5/10- Flexeril and Percocet given in MAU Pain has improved significantly in both areas Assessment and Plan  A: Round ligament pain Viral gastroenteritis Headache  P: Discharge home Rx for flexeril, zofran and phenergan sent to patient's phamacy Advised to use abd binder, warm baths and tylenol PRN for pain Patient advised to increase PO hydration as tolerated Patient advised to follow-up in [facility] clinic as scheduled Patient may return to MAU as needed or if her condition were to change or worsen  Freddi Starr, PA-C  04/03/2013, 2:38 PM

## 2013-04-03 NOTE — MAU Note (Signed)
Patient states that she started having right flank pain that radiates to the right side of her abdomen yesterday. Has nausea, diarrhea and a headache. Denies bleeding or discharge.

## 2013-04-03 NOTE — MAU Provider Note (Signed)
Attestation of Attending Supervision of Advanced Practitioner (PA/CNM/NP): Evaluation and management procedures were performed by the Advanced Practitioner under my supervision and collaboration.  I have reviewed the Advanced Practitioner's note and chart, and I agree with the management and plan.  Christyn Gutkowski, MD, FACOG Attending Obstetrician & Gynecologist Faculty Practice, [facility]  

## 2013-04-08 ENCOUNTER — Encounter: Payer: Self-pay | Admitting: *Deleted

## 2013-04-10 ENCOUNTER — Encounter: Payer: Self-pay | Admitting: Family Medicine

## 2013-04-14 ENCOUNTER — Ambulatory Visit (INDEPENDENT_AMBULATORY_CARE_PROVIDER_SITE_OTHER): Payer: Medicaid Other | Admitting: Obstetrics & Gynecology

## 2013-04-14 VITALS — BP 125/72 | Temp 97.4°F | Wt 292.3 lb

## 2013-04-14 DIAGNOSIS — O2686 Pruritic urticarial papules and plaques of pregnancy (PUPPP): Secondary | ICD-10-CM

## 2013-04-14 DIAGNOSIS — L988 Other specified disorders of the skin and subcutaneous tissue: Secondary | ICD-10-CM

## 2013-04-14 DIAGNOSIS — O0990 Supervision of high risk pregnancy, unspecified, unspecified trimester: Secondary | ICD-10-CM

## 2013-04-14 DIAGNOSIS — R82998 Other abnormal findings in urine: Secondary | ICD-10-CM

## 2013-04-14 DIAGNOSIS — O24919 Unspecified diabetes mellitus in pregnancy, unspecified trimester: Secondary | ICD-10-CM

## 2013-04-14 DIAGNOSIS — O9989 Other specified diseases and conditions complicating pregnancy, childbirth and the puerperium: Secondary | ICD-10-CM

## 2013-04-14 LAB — POCT URINALYSIS DIP (DEVICE)
Protein, ur: NEGATIVE mg/dL
Specific Gravity, Urine: 1.025 (ref 1.005–1.030)
pH: 6 (ref 5.0–8.0)

## 2013-04-14 MED ORDER — HYDROXYZINE HCL 25 MG PO TABS: 25.0000 mg | ORAL_TABLET | Freq: Four times a day (QID) | ORAL | Status: DC | PRN

## 2013-04-14 NOTE — Progress Notes (Signed)
Pulse- 111   Edema-right knee/feet

## 2013-04-14 NOTE — Progress Notes (Deleted)
jjkk

## 2013-04-14 NOTE — Patient Instructions (Signed)
Return to clinic for any obstetric concerns or go to MAU for evaluation  

## 2013-04-14 NOTE — Progress Notes (Signed)
CBGs mostly within range.  UA showed +nitrites, no protein, no LE, no blood. Patient denies urinary symptoms, will follow up urine culture.  Normal anatomy scan.  Too late for AFP screen, missed No other complaints or concerns.  Routine obstetric precautions reviewed.  Right knee and foot looks and palpates normal on exam, will continue to monitor.

## 2013-04-15 ENCOUNTER — Inpatient Hospital Stay (HOSPITAL_COMMUNITY)
Admission: AD | Admit: 2013-04-15 | Discharge: 2013-04-16 | Disposition: A | Discharge status: Home / Self Care | Payer: Medicaid Other | Source: Ambulatory Visit | Attending: Family Medicine | Admitting: Family Medicine

## 2013-04-15 ENCOUNTER — Encounter (HOSPITAL_COMMUNITY): Payer: Self-pay | Admitting: *Deleted

## 2013-04-15 DIAGNOSIS — O26899 Other specified pregnancy related conditions, unspecified trimester: Secondary | ICD-10-CM

## 2013-04-15 DIAGNOSIS — R109 Unspecified abdominal pain: Secondary | ICD-10-CM | POA: Insufficient documentation

## 2013-04-15 DIAGNOSIS — R7309 Other abnormal glucose: Secondary | ICD-10-CM | POA: Insufficient documentation

## 2013-04-15 DIAGNOSIS — O99891 Other specified diseases and conditions complicating pregnancy: Secondary | ICD-10-CM | POA: Insufficient documentation

## 2013-04-15 DIAGNOSIS — O09299 Supervision of pregnancy with other poor reproductive or obstetric history, unspecified trimester: Secondary | ICD-10-CM

## 2013-04-15 DIAGNOSIS — O0990 Supervision of high risk pregnancy, unspecified, unspecified trimester: Secondary | ICD-10-CM

## 2013-04-15 DIAGNOSIS — R82998 Other abnormal findings in urine: Secondary | ICD-10-CM | POA: Insufficient documentation

## 2013-04-15 DIAGNOSIS — O24919 Unspecified diabetes mellitus in pregnancy, unspecified trimester: Secondary | ICD-10-CM

## 2013-04-15 MED ORDER — PHENAZOPYRIDINE HCL 100 MG PO TABS
200.0000 mg | ORAL_TABLET | Freq: Once | ORAL | Status: AC
  Administered 2013-04-16: 200 mg via ORAL
  Filled 2013-04-15: qty 2

## 2013-04-15 NOTE — MAU Note (Signed)
Pt states she started having pain in her lower right side about 2 hours ago.Pt states she had this pain before and she was given before. Pt states  Pain is worse this time. Pt states she had 2 teeth pulled today at 1330 and was fine until about 2 hours ago

## 2013-04-16 DIAGNOSIS — R1031 Right lower quadrant pain: Secondary | ICD-10-CM

## 2013-04-16 LAB — WET PREP, GENITAL
Clue Cells Wet Prep HPF POC: NONE SEEN
Trich, Wet Prep: NONE SEEN

## 2013-04-16 LAB — GLUCOSE, CAPILLARY: Glucose-Capillary: 186 mg/dL — ABNORMAL HIGH (ref 70–99)

## 2013-04-16 LAB — CBC
MCV: 84.4 fL (ref 78.0–100.0)
Platelets: 285 10*3/uL (ref 150–400)
RBC: 4.05 MIL/uL (ref 3.87–5.11)
WBC: 14.1 10*3/uL — ABNORMAL HIGH (ref 4.0–10.5)

## 2013-04-16 MED ORDER — CEPHALEXIN 500 MG PO CAPS: 500.0000 mg | ORAL_CAPSULE | Freq: Four times a day (QID) | ORAL | Status: DC

## 2013-04-16 MED ORDER — TRAMADOL HCL 50 MG PO TABS
100.0000 mg | ORAL_TABLET | Freq: Once | ORAL | Status: AC
  Administered 2013-04-16: 100 mg via ORAL
  Filled 2013-04-16: qty 2

## 2013-04-16 MED ORDER — PHENAZOPYRIDINE HCL 200 MG PO TABS: 200.0000 mg | ORAL_TABLET | Freq: Three times a day (TID) | ORAL | Status: DC | PRN

## 2013-04-16 MED ORDER — CEFTRIAXONE SODIUM 1 G IJ SOLR
1.0000 g | Freq: Once | INTRAMUSCULAR | Status: AC
  Administered 2013-04-16: 1 g via INTRAMUSCULAR
  Filled 2013-04-16: qty 10

## 2013-04-16 NOTE — MAU Provider Note (Signed)
Chart reviewed and agree with management and plan.  

## 2013-04-16 NOTE — Progress Notes (Signed)
Pt states she feels like she needs to poop. Providers called to bedside for exam

## 2013-04-16 NOTE — MAU Provider Note (Signed)
History     CSN: [id number]  Arrival date and time: 04/15/13 2320   First Provider Initiated Contact with Patient 04/15/13 2354      Chief Complaint  Patient presents with  . Abdominal Pain   HPI Comments: Ms. Hilgeman presents with a 2 hour history of lower right sided constant, crampy abdominal pain radiating to right side of back. She had two teeth pulled today around 1:30pm. Pain started after she ate spaghetti and salad for dinner. Pt states she had this pain before and she was given flexeril. She used the flexeril with no alleviation of pain. She also took a vicodin before she onset of pain for her teeth. Pt states Pain is worse this time. Has not noticed any hematuria and is not having dysuria. Prolonged immobility aggravates pain with no alleviating factors. She has had normal bowel movements but has not passed gas today. She has frequent urges to defecate with no successful bowel movement. No hx of fever, no diarrhea or constipation. Has nausea and vomited once in MAU. UA from two days ago shows +nitrite and urine culture grew E. Coli > 100,000.  Abdominal Pain This is a new problem. The current episode started today. The onset quality is sudden. The problem occurs constantly. The most recent episode lasted 2 hours. The problem has been unchanged. The pain is located in the RLQ and RUQ. The pain is severe. The quality of the pain is aching. The abdominal pain radiates to the back. The pain is aggravated by certain positions. The pain is relieved by nothing. She has tried oral narcotic analgesics (has tried flexeril and vicodin) for the symptoms. The treatment provided no relief.    OB History   Grav Para Term Preterm Abortions TAB SAB Ect Mult Living   2 1 1       1       Past Medical History  Diagnosis Date  . Diabetes mellitus   . Depression bi-polar  . Obesity     Past Surgical History  Procedure Laterality Date  . No past surgeries    . Mouth surgery      Family  History  Problem Relation Age of Onset  . Asthma Mother   . Diabetes Mother   . Alcohol abuse Mother     History  Substance Use Topics  . Smoking status: Current Every Day Smoker -- 0.25 packs/day for 6 years    Types: Cigarettes  . Smokeless tobacco: Never Used     Comment: trying to quitt  . Alcohol Use: No    Allergies: No Known Allergies  Prescriptions prior to admission  Medication Sig Dispense Refill  . acetaminophen (TYLENOL) 500 MG tablet Take 500 mg by mouth every 6 (six) hours as needed for pain (stomach pain).      . Blood Glucose Monitoring Suppl (ACCU-CHEK NANO SMARTVIEW) W/DEVICE KIT 1 kit by Does not apply route once. Please check blood sugars twice daily  1 kit  0  . cephALEXin (KEFLEX) 500 MG capsule Take 1 capsule (500 mg total) by mouth 3 (three) times daily.  28 capsule  0  . cyclobenzaprine (FLEXERIL) 10 MG tablet Take 1 tablet (10 mg total) by mouth 3 (three) times daily as needed for muscle spasms.  30 tablet  0  . diphenhydrAMINE (BENADRYL) 25 MG tablet Take 25 mg by mouth every 6 (six) hours as needed for itching.      Marland Kitchen glucose blood (ACCU-CHEK ACTIVE STRIPS) test strip Check Blood sugars  four times a day  100 each  12  . glyBURIDE (DIABETA) 5 MG tablet TAKE 1 TABLET BY MOUTH IN THE MORNING AND 2 TABLETS BY MOUTH IN THE EVENING  90 tablet  11  . hydrOXYzine (ATARAX/VISTARIL) 25 MG tablet Take 1-2 tablets (25-50 mg total) by mouth every 6 (six) hours as needed for itching.  60 tablet  2  . Lancet Devices (ACCU-CHEK SOFTCLIX) lancets Check Blood sugar four times a day  1 each  11  . metFORMIN (GLUCOPHAGE) 1000 MG tablet Take 1 tablet (1,000 mg total) by mouth 2 (two) times daily with a meal.  30 tablet  11  . ondansetron (ZOFRAN) 4 MG tablet Take 1 tablet (4 mg total) by mouth every 6 (six) hours.  12 tablet  0  . Prenatal Vit-Fe Fumarate-FA (PRENATAL MULTIVITAMIN) TABS tablet Take 1 tablet by mouth daily at 12 noon.      . promethazine (PHENERGAN) 12.5 MG  tablet Take 2 tablets (25 mg total) by mouth every 6 (six) hours as needed for nausea.  20 tablet  0  . promethazine (PHENERGAN) 25 MG tablet Take 1 tablet (25 mg total) by mouth every 6 (six) hours as needed for nausea.  30 tablet  0    Review of Systems  Gastrointestinal: Positive for abdominal pain.   Physical Exam   Blood pressure 138/80, pulse 101, temperature 98.5 F (36.9 C), temperature source Oral, resp. rate 18, last menstrual period 10/08/2012.  Physical Exam  Nursing note and vitals reviewed. Constitutional: She is oriented to person, place, and time. She appears well-developed and well-nourished. She appears distressed.  Cardiovascular: Regular rhythm, normal heart sounds and normal pulses.  Tachycardia present.   Respiratory: Effort normal and breath sounds normal. No respiratory distress. She has no wheezes.  GI: Soft. There is tenderness in the right upper quadrant and right lower quadrant. There is no rebound, no guarding and no CVA tenderness.  Musculoskeletal: Normal range of motion. She exhibits edema. She exhibits no tenderness.  Neurological: She is alert and oriented to person, place, and time.  Skin: Skin is warm. She is not diaphoretic. No erythema.   Doppler: 146bpm, moderate variability  Lab Results  Component Value Date   WBC 14.1* 04/15/2013   HGB 11.7* 04/15/2013   HCT 34.2* 04/15/2013   MCV 84.4 04/15/2013   PLT 285 04/15/2013   Urinalysis    Component Value Date/Time   COLORURINE YELLOW 04/03/2013 0945   APPEARANCEUR CLEAR 04/03/2013 0945   LABSPEC 1.025 04/14/2013 0858   PHURINE 6.0 04/14/2013 0858   GLUCOSEU 100* 04/14/2013 0858   HGBUR NEGATIVE 04/14/2013 0858   HGBUR negative 11/15/2009 0944   BILIRUBINUR NEGATIVE 04/14/2013 0858   KETONESUR TRACE* 04/14/2013 0858   PROTEINUR NEGATIVE 04/14/2013 0858   UROBILINOGEN 0.2 04/14/2013 0858   NITRITE POSITIVE* 04/14/2013 0858   LEUKOCYTESUR NEGATIVE 04/14/2013 0858   Results for orders placed during the hospital  encounter of 04/15/13 (from the past 24 hour(s))  CBC     Status: Abnormal   Collection Time    04/15/13 11:59 PM      Result Value Range   WBC 14.1 (*) 4.0 - 10.5 K/uL   RBC 4.05  3.87 - 5.11 MIL/uL   Hemoglobin 11.7 (*) 12.0 - 15.0 g/dL   HCT 16.1 (*) 09.6 - 04.5 %   MCV 84.4  78.0 - 100.0 fL   MCH 28.9  26.0 - 34.0 pg   MCHC 34.2  30.0 - 36.0 g/dL  RDW 13.3  11.5 - 15.5 %   Platelets 285  150 - 400 K/uL  WET PREP, GENITAL     Status: Abnormal   Collection Time    04/16/13 12:17 AM      Result Value Range   Yeast Wet Prep HPF POC NONE SEEN  NONE SEEN   Trich, Wet Prep NONE SEEN  NONE SEEN   Clue Cells Wet Prep HPF POC NONE SEEN  NONE SEEN   WBC, Wet Prep HPF POC MODERATE (*) NONE SEEN  GLUCOSE, CAPILLARY     Status: Abnormal   Collection Time    04/16/13  1:49 AM      Result Value Range   Glucose-Capillary 186 (*) 70 - 99 mg/dL   Comment 1 Notify RN      Microscopic wet-mount exam shows negative for pathogens, normal epithelial cells, positive for moderate white blood cells.  GC/Chlamydia: In process  Urine culture (04/14/13): +E. Coli >100,000.  CBG: 186   MAU Course  Procedures None  MDM - order UA, CBC, GC/chlamydia, wet prep, CBG - pyrimidine 200mg  which improved pain - 1g ceftriaxone given for complicated bacteruria  Assessment and Plan   26yo G2P1001 at [redacted]w[redacted]d with presenting with complicated bacteruria with severe right sided abdominal pain, currently improved.  #Complicated bacteruria - hx of recent urinary infection with current severe abdominal pain. Pain relieved slightly with pyrimidine. No fever or CVA tenderness. Still experiencing pain - ceftriaxone 1g IM x1 in MAU - keflex 500mg  QID x7 days - pyrimidine 200mg  TID as needed for pain - Patient advised to return to MAU as needed if condition does not improve or worsens  Jacquelin Hawking, MD 04/16/2013, 12:03 AM   I performed the speculum and bimanual exams and agree with above.   PELVIC: Cervix  long and closed. Uterus nontender, size equals dates.   Patient running two restroom frequently during MAU visit. Voiding small amount each time. Suspect that the pain is due to UTI, but appendicitis precautions reviewed.  Hyperglycemia. Strongly encourage patient to take diabetes medications as instructed. CBG is in range since medication change per last prenatal visit note.  [place], CNM 04/16/2013 5:12 AM

## 2013-04-17 LAB — CULTURE, OB URINE

## 2013-05-05 ENCOUNTER — Ambulatory Visit (INDEPENDENT_AMBULATORY_CARE_PROVIDER_SITE_OTHER): Payer: Medicaid Other | Admitting: Obstetrics & Gynecology

## 2013-05-05 ENCOUNTER — Encounter: Payer: Medicaid Other | Attending: Obstetrics and Gynecology | Admitting: *Deleted

## 2013-05-05 VITALS — BP 134/75 | Temp 97.0°F | Wt 287.3 lb

## 2013-05-05 DIAGNOSIS — Z713 Dietary counseling and surveillance: Secondary | ICD-10-CM | POA: Insufficient documentation

## 2013-05-05 DIAGNOSIS — E119 Type 2 diabetes mellitus without complications: Secondary | ICD-10-CM | POA: Insufficient documentation

## 2013-05-05 DIAGNOSIS — O24919 Unspecified diabetes mellitus in pregnancy, unspecified trimester: Secondary | ICD-10-CM | POA: Insufficient documentation

## 2013-05-05 DIAGNOSIS — O24912 Unspecified diabetes mellitus in pregnancy, second trimester: Secondary | ICD-10-CM

## 2013-05-05 LAB — POCT URINALYSIS DIP (DEVICE)
Hgb urine dipstick: NEGATIVE
Ketones, ur: 40 mg/dL — AB
Protein, ur: NEGATIVE mg/dL
Specific Gravity, Urine: >=1.03 (ref 1.005–1.030)
pH: 6 (ref 5.0–8.0)

## 2013-05-05 NOTE — Progress Notes (Signed)
P= 104,  States has checked cbg's but not " like I am supposed to " because of a lot of family stress and family emergencies

## 2013-05-05 NOTE — Progress Notes (Signed)
GDM: Patient was diagnosed with T2DM as a teenager. She tests periodically with recent averages of FBS 67-84. At breakfast she ate Egg, bread and OJ was 172 50 mins pp. She is presently taking Metformin 1000BID and Glyburide 5mg  daily. She will begin testing 4 times daily to include FBS, 2hpp breakfast, lunch, dinner. Handout given: Nutrition Diabetes & Pregnancy. Reviewed Dietary modifications. Will reevaluate medications upon return for f/u.

## 2013-05-05 NOTE — Progress Notes (Signed)
U/S scheduled 05/06/13 at 1130 am. Opthamology  Appointment with [facility] scheduled 05/20/13 at 11 am. Appointment with Jannifer Rodney, FNP scheduled 05/13/13 at 1045 am. Fetal Echo with Dr. Elizebeth Brooking scheduled 05/19/13 at 10 am. ML on VM with date/ Patient has contact information for this. Patient stated earlier she will call and make her [facility] appointment. Auth # for Fetal Echo is # [id number].

## 2013-05-05 NOTE — Progress Notes (Signed)
Pt has not been checking CBGs.  Fastings are nml per pt.  2 hr after dinner yesterday 145.  Pt is 172 today 50 mintes after eating.  Ate pumpkin bread and jucie and egg.  Meet with nutritionist today.  Pt understands risk of large fetus, shoulder dystocia, need for primary c/s, and fetal death. Pt having headaches--refer to [place].   Dizziness when walking.  Vertigo.  Will send back to [facility] for primary care complaints.  Also needs to address probable ring worm on chest. Needs to see opthalmologic and needs fetal echo. Will see in one week due to noncompliance. [place] for growth. RN staff to follow up on AFP.  I do not see it in the chart.

## 2013-05-06 ENCOUNTER — Ambulatory Visit (HOSPITAL_COMMUNITY)
Admission: RE | Admit: 2013-05-06 | Discharge: 2013-05-06 | Disposition: A | Discharge status: Home / Self Care | Payer: Medicaid Other | Source: Ambulatory Visit | Attending: Obstetrics & Gynecology | Admitting: Obstetrics & Gynecology

## 2013-05-06 ENCOUNTER — Encounter: Payer: Self-pay | Admitting: Obstetrics & Gynecology

## 2013-05-06 ENCOUNTER — Other Ambulatory Visit: Payer: Self-pay | Admitting: Obstetrics & Gynecology

## 2013-05-06 DIAGNOSIS — O99330 Smoking (tobacco) complicating pregnancy, unspecified trimester: Secondary | ICD-10-CM | POA: Insufficient documentation

## 2013-05-06 DIAGNOSIS — O24912 Unspecified diabetes mellitus in pregnancy, second trimester: Secondary | ICD-10-CM

## 2013-05-06 DIAGNOSIS — E669 Obesity, unspecified: Secondary | ICD-10-CM | POA: Insufficient documentation

## 2013-05-06 DIAGNOSIS — O24919 Unspecified diabetes mellitus in pregnancy, unspecified trimester: Secondary | ICD-10-CM | POA: Insufficient documentation

## 2013-05-09 ENCOUNTER — Encounter: Payer: Self-pay | Admitting: Family Medicine

## 2013-05-09 ENCOUNTER — Ambulatory Visit (INDEPENDENT_AMBULATORY_CARE_PROVIDER_SITE_OTHER): Payer: Medicaid Other | Admitting: Family Medicine

## 2013-05-09 VITALS — BP 116/76 | Temp 98.6°F | Wt 286.0 lb

## 2013-05-09 DIAGNOSIS — B354 Tinea corporis: Secondary | ICD-10-CM

## 2013-05-09 DIAGNOSIS — Z2089 Contact with and (suspected) exposure to other communicable diseases: Secondary | ICD-10-CM

## 2013-05-09 MED ORDER — CLOTRIMAZOLE 1 % EX CREA: TOPICAL_CREAM | Freq: Two times a day (BID) | CUTANEOUS | Status: DC

## 2013-05-09 MED ORDER — PERMETHRIN 5 % EX CREA: TOPICAL_CREAM | Freq: Once | CUTANEOUS | Status: DC

## 2013-05-09 NOTE — Patient Instructions (Signed)
Body Ringworm °Ringworm (tinea corporis) is a fungal infection of the skin on the body. This infection is not caused by worms, but is actually caused by a fungus. Fungus normally lives on the top of your skin and can be useful. However, in the case of ringworms, the fungus grows out of control and causes a skin infection. It can involve any area of skin on the body and can spread easily from one person to another (contagious). Ringworm is a common problem for children, but it can affect adults as well. Ringworm is also often found in athletes, especially wrestlers who share equipment and mats.  °CAUSES  °Ringworm of the body is caused by a fungus called dermatophyte. It can spread by: °· Touching other people who are infected. °· Touching infected pets. °· Touching or sharing objects that have been in contact with the infected person or pet (hats, combs, towels, clothing, sports equipment). °SYMPTOMS  °· Itchy, raised red spots and bumps on the skin. °· Ring-shaped rash. °· Redness near the border of the rash with a clear center. °· Dry and scaly skin on or around the rash. °Not every person develops a ring-shaped rash. Some develop only the red, scaly patches. °DIAGNOSIS  °Most often, ringworm can be diagnosed by performing a skin exam. Your caregiver may choose to take a skin scraping from the affected area. The sample will be examined under the microscope to see if the fungus is present.  °TREATMENT  °Body ringworm may be treated with a topical antifungal cream or ointment. Sometimes, an antifungal shampoo that can be used on your body is prescribed. You may be prescribed antifungal medicines to take by mouth if your ringworm is severe, keeps coming back, or lasts a long time.  °HOME CARE INSTRUCTIONS  °· Only take over-the-counter or prescription medicines as directed by your caregiver. °· Wash the infected area and dry it completely before applying your cream or ointment. °· When using antifungal shampoo to  treat the ringworm, leave the shampoo on the body for 3 5 minutes before rinsing.    °· Wear loose clothing to stop clothes from rubbing and irritating the rash. °· Wash or change your bed sheets every night while you have the rash. °· Have your pet treated by your veterinarian if it has the same infection. °To prevent ringworm:  °· Practice good hygiene. °· Wear sandals or shoes in public places and showers. °· Do not share personal items with others. °· Avoid touching red patches of skin on other people. °· Avoid touching pets that have bald spots or wash your hands after doing so. °SEEK MEDICAL CARE IF:  °· Your rash continues to spread after 7 days of treatment. °· Your rash is not gone in 4 weeks. °· The area around your rash becomes red, warm, tender, and swollen. °Document Released: 07/21/2000 Document Revised: 04/17/2012 Document Reviewed: 02/05/2012 °[employer]® Patient Information ©2014 [employer]. ° °

## 2013-05-09 NOTE — Progress Notes (Signed)
S: 27 yo presents for rash on chest. She is a 27 yo G2P1001 who is also [redacted]w[redacted]d pregnant and followed by [facility] for DM.   - her rash is on her right chest - thinks it is ringworm - has an area that has continued to increase in size -been there for the last 2 months and growing - occasionally itchy  - also has PUPs rash and chronic eczema changes as well  - here with son today who has a rash as well most consistent with scabies   No fevers, chills, erythema beyond the area in question, abd pain, nausea, vomiting. No nerve tingling.   O:  Filed Vitals:   05/09/13 1058  BP: 116/76  Temp: 98.6 F (37 C)  Weight: 286 lb (129.729 kg)   Gen: obese, gravid, NAD SKIN: 1.5 x3cm circular area of raised, scaly erythematous skin with central clearing on right upper chest wall. No extending erythema. Nontender. Dry cracked skin on bilateral wrists and erythematous papules beneath bilateral breasts as well.    A/P Scabies exposure - Plan: clotrimazole (LOTRIMIN) 1 % cream  Ringworm of body - Plan: permethrin (ACTICIN) 5 % cream   1) rash most consistent with ringworm on right chest wall - rx of clotrimazole given with instructions to take until 2 weeks post clearing - information given   2) scabies exposure - son treated for scabies - will treat with permethrin (cat B in preg) for ppx.   F/u with OBGYN for pregnancy as well

## 2013-05-11 ENCOUNTER — Encounter: Payer: Self-pay | Admitting: Obstetrics & Gynecology

## 2013-05-12 ENCOUNTER — Ambulatory Visit (INDEPENDENT_AMBULATORY_CARE_PROVIDER_SITE_OTHER): Payer: Medicaid Other | Admitting: Family Medicine

## 2013-05-12 VITALS — BP 135/87 | Temp 97.0°F | Wt 290.8 lb

## 2013-05-12 DIAGNOSIS — O0990 Supervision of high risk pregnancy, unspecified, unspecified trimester: Secondary | ICD-10-CM

## 2013-05-12 DIAGNOSIS — Z23 Encounter for immunization: Secondary | ICD-10-CM

## 2013-05-12 DIAGNOSIS — O24919 Unspecified diabetes mellitus in pregnancy, unspecified trimester: Secondary | ICD-10-CM

## 2013-05-12 NOTE — Progress Notes (Signed)
Pulse-[id number] 

## 2013-05-12 NOTE — Progress Notes (Signed)
FBS 60-126 2 hour pp 66-143 Advised adding exercise--dietary changes U/s growth last week--75%--fetal ECHO next week.

## 2013-05-12 NOTE — Patient Instructions (Signed)
Gestational Diabetes Mellitus Gestational diabetes mellitus, often simply referred to as gestational diabetes, is a type of diabetes that some women develop during pregnancy. In gestational diabetes, the pancreas does not make enough insulin (a hormone), the cells are less responsive to the insulin that is made (insulin resistance), or both.Normally, insulin moves sugars from food into the tissue cells. The tissue cells use the sugars for energy. The lack of insulin or the lack of normal response to insulin causes excess sugars to build up in the blood instead of going into the tissue cells. As a result, high blood sugar (hyperglycemia) develops. The effect of high sugar (glucose) levels can cause many complications.  RISK FACTORS You have an increased chance of developing gestational diabetes if you have a family history of diabetes and also have one or more of the following risk factors:  A body mass index over 30 (obesity).  A previous pregnancy with gestational diabetes.  An older age at the time of pregnancy. If blood glucose levels are kept in the normal range during pregnancy, women can have a healthy pregnancy. If your blood glucose levels are not well controlled, there may be risks to you, your unborn baby (fetus), your labor and delivery, or your newborn baby.  SYMPTOMS  If symptoms are experienced, they are much like symptoms you would normally expect during pregnancy. The symptoms of gestational diabetes include:   Increased thirst (polydipsia).  Increased urination (polyuria).  Increased urination during the night (nocturia).  Weight loss. This weight loss may be rapid.  Frequent, recurring infections.  Tiredness (fatigue).  Weakness.  Vision changes, such as blurred vision.  Fruity smell to your breath.  Abdominal pain. DIAGNOSIS Diabetes is diagnosed when blood glucose levels are increased. Your blood glucose level may be checked by one or more of the following  blood tests:  A fasting blood glucose test. You will not be allowed to eat for at least 8 hours before a blood sample is taken.  A random blood glucose test. Your blood glucose is checked at any time of the day regardless of when you ate.  A hemoglobin A1c blood glucose test. A hemoglobin A1c test provides information about blood glucose control over the previous 3 months.  An oral glucose tolerance test (OGTT). Your blood glucose is measured after you have not eaten (fasted) for 1 3 hours and then after you drink a glucose-containing beverage. Since the hormones that cause insulin resistance are highest at about 24 28 weeks of a pregnancy, an OGTT is usually performed during that time. If you have risk factors for gestational diabetes, your caregiver may test you for gestational diabetes earlier than 24 weeks of pregnancy. TREATMENT   You will need to take diabetes medicine or insulin daily to keep blood glucose levels in the desired range.  You will need to match insulin dosing with exercise and healthy food choices. The treatment goal is to maintain the before meal (preprandial), bedtime, and overnight blood glucose level at 60 99 mg/dL during pregnancy. The treatment goal is to further maintain peak after meal blood sugar (postprandial glucose) level at 100 140 mg/dL.  HOME CARE INSTRUCTIONS   Have your hemoglobin A1c level checked twice a year.  Perform daily blood glucose monitoring as directed by your caregiver. It is common to perform frequent blood glucose monitoring.  Monitor urine ketones when you are ill and as directed by your caregiver.  Take your diabetes medicine and insulin as directed by your caregiver to   maintain your blood glucose level in the desired range.  Never run out of diabetes medicine or insulin. It is needed every day.  Adjust insulin based on your intake of carbohydrates. Carbohydrates can raise blood glucose levels but need to be included in your diet.  Carbohydrates provide vitamins, minerals, and fiber which are an essential part of a healthy diet. Carbohydrates are found in fruits, vegetables, whole grains, dairy products, legumes, and foods containing added sugars.    Eat healthy foods. Alternate 3 meals with 3 snacks.  Maintain a healthy weight gain. The usual total expected weight gain varies according to your prepregnancy body mass index (BMI).  Carry a medical alert card or wear your medical alert jewelry.  Carry a 15 gram carbohydrate snack with you at all times to treat low blood glucose (hypoglycemia). Some examples of 15 gram carbohydrate snacks include:  Glucose tablets, 3 or 4   Glucose gel, 15 gram tube  Raisins, 2 tablespoons (24 g)  Jelly beans, 6  Animal crackers, 8  Fruit juice, regular soda, or low fat milk, 4 ounces (120 mL)  Gummy treats, 9    Recognize hypoglycemia. Hypoglycemia during pregnancy occurs with blood glucose levels of 60 mg/dL and below. The risk for hypoglycemia increases when fasting or skipping meals, during or after intense exercise, and during sleep. Hypoglycemia symptoms can include:  Tremors or shakes.  Decreased ability to concentrate.  Sweating.  Increased heart rate.  Headache.  Dry mouth.  Hunger.  Irritability.  Anxiety.  Restless sleep.  Altered speech or coordination.  Confusion.  Treat hypoglycemia promptly. If you are alert and able to safely swallow, follow the 15:15 rule:  Take 15 20 grams of rapid-acting glucose or carbohydrate. Rapid-acting options include glucose gel, glucose tablets, or 4 ounces (120 mL) of fruit juice, regular soda, or low fat milk.  Check your blood glucose level 15 minutes after taking the glucose.   Take 15 20 grams more of glucose if the repeat blood glucose level is still 70 mg/dL or below.  Eat a meal or snack within 1 hour once blood glucose levels return to normal.  Be alert to polyuria and polydipsia which are early  signs of hyperglycemia. An early awareness of hyperglycemia allows for prompt treatment. Treat hyperglycemia as directed by your caregiver.  Engage in at least 30 minutes of physical activity a day or as directed by your caregiver. Ten minutes of physical activity timed 30 minutes after each meal is encouraged to control postprandial blood glucose levels.  Adjust your insulin dosing and food intake as needed if you start a new exercise or sport.  Follow your sick day plan at any time you are unable to eat or drink as usual.  Avoid tobacco and alcohol use.  Follow up with your caregiver regularly.  Follow the advice of your caregiver regarding your prenatal and post-delivery (postpartum) appointments, meal planning, exercise, medicines, vitamins, blood tests, other medical tests, and physical activities.  Perform daily skin and foot care. Examine your skin and feet daily for cuts, bruises, redness, nail problems, bleeding, blisters, or sores.  Brush your teeth and gums at least twice a day and floss at least once a day. Follow up with your dentist regularly.  Schedule an eye exam during the first trimester of your pregnancy or as directed by your caregiver.  Share your diabetes management plan with your workplace or school.  Stay up-to-date with immunizations.  Learn to manage stress.  Obtain ongoing diabetes education   and support as needed. SEEK MEDICAL CARE IF:   You are unable to eat food or drink fluids for more than 6 hours.  You have nausea and vomiting for more than 6 hours.  You have a blood glucose level of 200 mg/dL and you have ketones in your urine.  There is a change in mental status.  You develop vision problems.  You have a persistent headache.  You have upper abdominal pain or discomfort.  You develop an additional serious illness.  You have diarrhea for more than 6 hours.  You have been sick or have had a fever for a couple of days and are not getting  better. SEEK IMMEDIATE MEDICAL CARE IF:   You have difficulty breathing.  You no longer feel the baby moving.  You are bleeding or have discharge from your vagina.  You start having premature contractions or labor. MAKE SURE YOU:  Understand these instructions.  Will watch your condition.  Will get help right away if you are not doing well or get worse. Document Released: 10/30/2000 Document Revised: 04/17/2012 Document Reviewed: 02/20/2012 [facility] Patient Information 2014 [place], [place].  Pregnancy - Second Trimester The second trimester of pregnancy (3 to 6 months) is a period of rapid growth for you and your baby. At the end of the sixth month, your baby is about 9 inches long and weighs 1 1/2 pounds. You will begin to feel the baby move between 18 and 20 weeks of the pregnancy. This is called quickening. Weight gain is faster. A clear fluid (colostrum) may leak out of your breasts. You may feel small contractions of the womb (uterus). This is known as false labor or Braxton-Hicks contractions. This is like a practice for labor when the baby is ready to be born. Usually, the problems with morning sickness have usually passed by the end of your first trimester. Some women develop small dark blotches (called cholasma, mask of pregnancy) on their face that usually goes away after the baby is born. Exposure to the sun makes the blotches worse. Acne may also develop in some pregnant women and pregnant women who have acne, may find that it goes away. PRENATAL EXAMS  Blood work may continue to be done during prenatal exams. These tests are done to check on your health and the probable health of your baby. Blood work is used to follow your blood levels (hemoglobin). Anemia (low hemoglobin) is common during pregnancy. Iron and vitamins are given to help prevent this. You will also be checked for diabetes between 24 and 28 weeks of the pregnancy. Some of the previous blood tests may be  repeated.  The size of the uterus is measured during each visit. This is to make sure that the baby is continuing to grow properly according to the dates of the pregnancy.  Your blood pressure is checked every prenatal visit. This is to make sure you are not getting toxemia.  Your urine is checked to make sure you do not have an infection, diabetes or protein in the urine.  Your weight is checked often to make sure gains are happening at the suggested rate. This is to ensure that both you and your baby are growing normally.  Sometimes, an ultrasound is performed to confirm the proper growth and development of the baby. This is a test which bounces harmless sound waves off the baby so your caregiver can more accurately determine due dates. Sometimes, a test is done on the amniotic fluid surrounding the  baby. This test is called an amniocentesis. The amniotic fluid is obtained by sticking a needle into the belly (abdomen). This is done to check the chromosomes in instances where there is a concern about possible genetic problems with the baby. It is also sometimes done near the end of pregnancy if an early delivery is required. In this case, it is done to help make sure the baby's lungs are mature enough for the baby to live outside of the womb. CHANGES OCCURING IN THE SECOND TRIMESTER OF PREGNANCY Your body goes through many changes during pregnancy. They vary from person to person. Talk to your caregiver about changes you notice that you are concerned about.  During the second trimester, you will likely have an increase in your appetite. It is normal to have cravings for certain foods. This varies from person to person and pregnancy to pregnancy.  Your lower abdomen will begin to bulge.  You may have to urinate more often because the uterus and baby are pressing on your bladder. It is also common to get more bladder infections during pregnancy. You can help this by drinking lots of fluids and  emptying your bladder before and after intercourse.  You may begin to get stretch marks on your hips, abdomen, and breasts. These are normal changes in the body during pregnancy. There are no exercises or medicines to take that prevent this change.  You may begin to develop swollen and bulging veins (varicose veins) in your legs. Wearing support hose, elevating your feet for 15 minutes, 3 to 4 times a day and limiting salt in your diet helps lessen the problem.  Heartburn may develop as the uterus grows and pushes up against the stomach. Antacids recommended by your caregiver helps with this problem. Also, eating smaller meals 4 to 5 times a day helps.  Constipation can be treated with a stool softener or adding bulk to your diet. Drinking lots of fluids, and eating vegetables, fruits, and whole grains are helpful.  Exercising is also helpful. If you have been very active up until your pregnancy, most of these activities can be continued during your pregnancy. If you have been less active, it is helpful to start an exercise program such as walking.  Hemorrhoids may develop at the end of the second trimester. Warm sitz baths and hemorrhoid cream recommended by your caregiver helps hemorrhoid problems.  Backaches may develop during this time of your pregnancy. Avoid heavy lifting, wear low heal shoes, and practice good posture to help with backache problems.  Some pregnant women develop tingling and numbness of their hand and fingers because of swelling and tightening of ligaments in the wrist (carpel tunnel syndrome). This goes away after the baby is born.  As your breasts enlarge, you may have to get a bigger bra. Get a comfortable, cotton, support bra. Do not get a nursing bra until the last month of the pregnancy if you will be nursing the baby.  You may get a dark line from your belly button to the pubic area called the linea nigra.  You may develop rosy cheeks because of increase blood flow  to the face.  You may develop spider looking lines of the face, neck, arms, and chest. These go away after the baby is born. HOME CARE INSTRUCTIONS   It is extremely important to avoid all smoking, herbs, alcohol, and unprescribed drugs during your pregnancy. These chemicals affect the formation and growth of the baby. Avoid these chemicals throughout the pregnancy  to ensure the delivery of a healthy infant.  Most of your home care instructions are the same as suggested for the first trimester of your pregnancy. Keep your caregiver's appointments. Follow your caregiver's instructions regarding medicine use, exercise, and diet.  During pregnancy, you are providing food for you and your baby. Continue to eat regular, well-balanced meals. Choose foods such as meat, fish, milk and other low fat dairy products, vegetables, fruits, and whole-grain breads and cereals. Your caregiver will tell you of the ideal weight gain.  A physical sexual relationship may be continued up until near the end of pregnancy if there are no other problems. Problems could include early (premature) leaking of amniotic fluid from the membranes, vaginal bleeding, abdominal pain, or other medical or pregnancy problems.  Exercise regularly if there are no restrictions. Check with your caregiver if you are unsure of the safety of some of your exercises. The greatest weight gain will occur in the last 2 trimesters of pregnancy. Exercise will help you:  Control your weight.  Get you in shape for labor and delivery.  Lose weight after you have the baby.  Wear a good support or jogging bra for breast tenderness during pregnancy. This may help if worn during sleep. Pads or tissues may be used in the bra if you are leaking colostrum.  Do not use hot tubs, steam rooms or saunas throughout the pregnancy.  Wear your seat belt at all times when driving. This protects you and your baby if you are in an accident.  Avoid raw meat,  uncooked cheese, cat litter boxes, and soil used by cats. These carry germs that can cause birth defects in the baby.  The second trimester is also a good time to visit your dentist for your dental health if this has not been done yet. Getting your teeth cleaned is okay. Use a soft toothbrush. Brush gently during pregnancy.  It is easier to leak urine during pregnancy. Tightening up and strengthening the pelvic muscles will help with this problem. Practice stopping your urination while you are going to the bathroom. These are the same muscles you need to strengthen. It is also the muscles you would use as if you were trying to stop from passing gas. You can practice tightening these muscles up 10 times a set and repeating this about 3 times per day. Once you know what muscles to tighten up, do not perform these exercises during urination. It is more likely to contribute to an infection by backing up the urine.  Ask for help if you have financial, counseling, or nutritional needs during pregnancy. Your caregiver will be able to offer counseling for these needs as well as refer you for other special needs.  Your skin may become oily. If so, wash your face with mild soap, use non-greasy moisturizer and oil or cream based makeup. MEDICINES AND DRUG USE IN PREGNANCY  Take prenatal vitamins as directed. The vitamin should contain 1 milligram of folic acid. Keep all vitamins out of reach of children. Only a couple vitamins or tablets containing iron may be fatal to a baby or young child when ingested.  Avoid use of all medicines, including herbs, over-the-counter medicines, not prescribed or suggested by your caregiver. Only take over-the-counter or prescription medicines for pain, discomfort, or fever as directed by your caregiver. Do not use aspirin.  Let your caregiver also know about herbs you may be using.  Alcohol is related to a number of birth defects. This includes  fetal alcohol syndrome. All  alcohol, in any form, should be avoided completely. Smoking will cause low birth rate and premature babies.  Street or illegal drugs are very harmful to the baby. They are absolutely forbidden. A baby born to an addicted mother will be addicted at birth. The baby will go through the same withdrawal an adult does. SEEK MEDICAL CARE IF:  You have any concerns or worries during your pregnancy. It is better to call with your questions if you feel they cannot wait, rather than worry about them. SEEK IMMEDIATE MEDICAL CARE IF:   An unexplained oral temperature above 102 F (38.9 C) develops, or as your caregiver suggests.  You have leaking of fluid from the vagina (birth canal). If leaking membranes are suspected, take your temperature and tell your caregiver of this when you call.  There is vaginal spotting, bleeding, or passing clots. Tell your caregiver of the amount and how many pads are used. Light spotting in pregnancy is common, especially following intercourse.  You develop a bad smelling vaginal discharge with a change in the color from clear to white.  You continue to feel sick to your stomach (nauseated) and have no relief from remedies suggested. You vomit blood or coffee ground-like materials.  You lose more than 2 pounds of weight or gain more than 2 pounds of weight over 1 week, or as suggested by your caregiver.  You notice swelling of your face, hands, feet, or legs.  You get exposed to [place] measles and have never had them.  You are exposed to fifth disease or chickenpox.  You develop belly (abdominal) pain. Round ligament discomfort is a common non-cancerous (benign) cause of abdominal pain in pregnancy. Your caregiver still must evaluate you.  You develop a bad headache that does not go away.  You develop fever, diarrhea, pain with urination, or shortness of breath.  You develop visual problems, blurry, or double vision.  You fall or are in a car accident or any kind  of trauma.  There is mental or physical violence at home. Document Released: 07/18/2001 Document Revised: 04/17/2012 Document Reviewed: 01/20/2009 [facility] Patient Information 2014 [place], [place].  Breastfeeding A change in hormones during your pregnancy causes growth of your breast tissue and an increase in number and size of milk ducts. The hormone prolactin allows proteins, sugars, and fats from your blood supply to make breast milk in your milk-producing glands. The hormone progesterone prevents breast milk from being released before the birth of your baby. After the birth of your baby, your progesterone level decreases allowing breast milk to be released. Thoughts of your baby, as well as his or her sucking or crying, can stimulate the release of milk from the milk-producing glands. Deciding to breastfeed (nurse) is one of the best choices you can make for you and your baby. The information that follows gives a brief review of the benefits, as well as other important skills to know about breastfeeding. BENEFITS OF BREASTFEEDING For your baby  The first milk (colostrum) helps your baby's digestive system function better.   There are antibodies in your milk that help your baby fight off infections.   Your baby has a lower incidence of asthma, allergies, and sudden infant death syndrome (SIDS).   The nutrients in breast milk are better for your baby than infant formulas.  Breast milk improves your baby's brain development.   Your baby will have less gas, colic, and constipation.  Your baby is less likely to develop  other conditions, such as childhood obesity, asthma, or diabetes mellitus. For you  Breastfeeding helps develop a very special bond between you and your baby.   Breastfeeding is convenient, always available at the correct temperature, and costs nothing.   Breastfeeding helps to burn calories and helps you lose the weight gained during pregnancy.   Breastfeeding  makes your uterus contract back down to normal size faster and slows bleeding following delivery.   Breastfeeding mothers have a lower risk of developing osteoporosis or breast or ovarian cancer later in life.  BREASTFEEDING FREQUENCY  A healthy, full-term baby may breastfeed as often as every hour or space his or her feedings to every 3 hours. Breastfeeding frequency will vary from baby to baby.   Newborns should be fed no less than every 2 3 hours during the day and every 4 5 hours during the night. You should breastfeed a minimum of 8 feedings in a 24 hour period.  Awaken your baby to breastfeed if it has been 3 4 hours since the last feeding.  Breastfeed when you feel the need to reduce the fullness of your breasts or when your newborn shows signs of hunger. Signs that your baby may be hungry include:  Increased alertness or activity.  Stretching.  Movement of the head from side to side.  Movement of the head and opening of the mouth when the corner of the mouth or cheek is stroked (rooting).  Increased sucking sounds, smacking lips, cooing, sighing, or squeaking.  Hand-to-mouth movements.  Increased sucking of fingers or hands.  Fussing.  Intermittent crying.  Signs of extreme hunger will require calming and consoling before you try to feed your baby. Signs of extreme hunger may include:  Restlessness.  A loud, strong cry.  Screaming.  Frequent feeding will help you make more milk and will help prevent problems, such as sore nipples and engorgement of the breasts.  BREASTFEEDING   Whether lying down or sitting, be sure that the baby's abdomen is facing your abdomen.   Support your breast with 4 fingers under your breast and your thumb above your nipple. Make sure your fingers are well away from your nipple and your baby's mouth.   Stroke your baby's lips gently with your finger or nipple.   When your baby's mouth is open wide enough, place all of your  nipple and as much of the colored area around your nipple (areola) as possible into your baby's mouth.  More areola should be visible above his or her upper lip than below his or her lower lip.  Your baby's tongue should be between his or her lower gum and your breast.  Ensure that your baby's mouth is correctly positioned around the nipple (latched). Your baby's lips should create a seal on your breast.  Signs that your baby has effectively latched onto your nipple include:  Tugging or sucking without pain.  Swallowing heard between sucks.  Absent click or smacking sound.  Muscle movement above and in front of his or her ears with sucking.  Your baby must suck about 2 3 minutes in order to get your milk. Allow your baby to feed on each breast as long as he or she wants. Nurse your baby until he or she unlatches or falls asleep at the first breast, then offer the second breast.  Signs that your baby is full and satisfied include:  A gradual decrease in the number of sucks or complete cessation of sucking.  Falling asleep.  Extension or relaxation of his or her body.  Retention of a small amount of milk in his or her mouth.  Letting go of your breast by himself or herself.  Signs of effective breastfeeding in you include:  Breasts that have increased firmness, weight, and size prior to feeding.  Breasts that are softer after nursing.  Increased milk volume, as well as a change in milk consistency and color by the 5th day of breastfeeding.  Breast fullness relieved by breastfeeding.  Nipples are not sore, cracked, or bleeding.  If needed, break the suction by putting your finger into the corner of your baby's mouth and sliding your finger between his or her gums. Then, remove your breast from his or her mouth.  It is common for babies to spit up a small amount after a feeding.  Babies often swallow air during feeding. This can make babies fussy. Burping your baby between  breasts can help with this.  Vitamin D supplements are recommended for babies who get only breast milk.  Avoid using a pacifier during your baby's first 4 6 weeks.  Avoid supplemental feedings of water, formula, or juice in place of breastfeeding. Breast milk is all the food your baby needs. It is not necessary for your baby to have water or formula. Your breasts will make more milk if supplemental feedings are avoided during the early weeks. HOW TO TELL WHETHER YOUR BABY IS GETTING ENOUGH BREAST MILK Wondering whether or not your baby is getting enough milk is a common concern among mothers. You can be assured that your baby is getting enough milk if:   Your baby is actively sucking and you hear swallowing.   Your baby seems relaxed and satisfied after a feeding.   Your baby nurses at least 8 12 times in a 24 hour time period.  During the first 2 32 days of age:  Your baby is wetting at least 3 5 diapers in a 24 hour period. The urine should be clear and pale yellow.  Your baby is having at least 3 4 stools in a 24 hour period. The stool should be soft and yellow.  At 72 31 days of age, your baby is having at least 3 6 stools in a 24 hour period. The stool should be seedy and yellow by 42 days of age.  Your baby has a weight loss less than 7 10% during the first 50 days of age.  Your baby does not lose weight after 41 39 days of age.  Your baby gains 4 7 ounces each week after he or she is 53 days of age.  Your baby gains weight by 64 days of age and is back to birth weight within 2 weeks. ENGORGEMENT In the first week after your baby is born, you may experience extremely full breasts (engorgement). When engorged, your breasts may feel heavy, warm, or tender to the touch. Engorgement peaks within 24 48 hours after delivery of your baby.  Engorgement may be reduced by:  Continuing to breastfeed.  Increasing the frequency of breastfeeding.  Taking warm showers or applying warm, moist  heat to your breasts just before each feeding. This increases circulation and helps the milk flow.   Gently massaging your breast before and during the feedings. With your fingertips, massage from your chest wall towards your nipple in a circular motion.   Ensuring that your baby empties at least one breast at every feeding. It also helps to start the next feeding  on the opposite breast.   Expressing breast milk by hand or by using a breast pump to empty the breasts if your baby is sleepy, or not nursing well. You may also want to express milk if you are returning to work oryou feel you are getting engorged.  Ensuring your baby is latched on and positioned properly while breastfeeding. If you follow these suggestions, your engorgement should improve in 24 48 hours. If you are still experiencing difficulty, call your lactation consultant or caregiver.  CARING FOR YOURSELF Take care of your breasts.  Bathe or shower daily.   Avoid using soap on your nipples.   Wear a supportive bra. Avoid wearing underwire style bras.  Air dry your nipples for a 3 after each feeding.   Use only cotton bra pads to absorb breast milk leakage. Leaking of breast milk between feedings is normal.   Use only pure lanolin on your nipples after nursing. You do not need to wash it off before feeding your baby again. Another option is to express a few drops of breast milk and gently massage that milk into your nipples.  Continue breast self-awareness checks. Take care of yourself.  Eat healthy foods. Alternate 3 meals with 3 snacks.  Avoid foods that you notice affect your baby in a bad way.  Drink milk, fruit juice, and water to satisfy your thirst (about 8 glasses a day).   Rest often, relax, and take your prenatal vitamins to prevent fatigue, stress, and anemia.  Avoid chewing and smoking tobacco.  Avoid alcohol and drug use.  Take over-the-counter and prescribed medicine only as  directed by your caregiver or pharmacist. You should always check with your caregiver or pharmacist before taking any new medicine, vitamin, or herbal supplement.  Know that pregnancy is possible while breastfeeding. If desired, talk to your caregiver about family planning and safe birth control methods that may be used while breastfeeding. SEEK MEDICAL CARE IF:   You feel like you want to stop breastfeeding or have become frustrated with breastfeeding.  You have painful breasts or nipples.  Your nipples are cracked or bleeding.  Your breasts are red, tender, or warm.  You have a swollen area on either breast.  You have a fever or chills.  You have nausea or vomiting.  You have drainage from your nipples.  Your breasts do not become full before feedings by the 5th day after delivery.  You feel sad and depressed.  Your baby is too sleepy to eat well.  Your baby is having trouble sleeping.   Your baby is wetting less than 3 diapers in a 24 hour period.  Your baby has less than 3 stools in a 24 hour period.  Your baby's skin or the white part of his or her eyes becomes more yellow.   Your baby is not gaining weight by 60 days of age. MAKE SURE YOU:   Understand these instructions.  Will watch your condition.  Will get help right away if you are not doing well or get worse. Document Released: 07/24/2005 Document Revised: 04/17/2012 Document Reviewed: 02/28/2012 [facility] Patient Information 2014 [place], [place].

## 2013-05-13 ENCOUNTER — Ambulatory Visit (INDEPENDENT_AMBULATORY_CARE_PROVIDER_SITE_OTHER): Payer: Medicaid Other | Admitting: Nurse Practitioner

## 2013-05-13 ENCOUNTER — Encounter: Payer: Self-pay | Admitting: Nurse Practitioner

## 2013-05-13 VITALS — BP 112/76 | HR 105 | Ht 64.0 in | Wt 292.0 lb

## 2013-05-13 DIAGNOSIS — G43009 Migraine without aura, not intractable, without status migrainosus: Secondary | ICD-10-CM

## 2013-05-13 DIAGNOSIS — G4700 Insomnia, unspecified: Secondary | ICD-10-CM

## 2013-05-13 DIAGNOSIS — Z3492 Encounter for supervision of normal pregnancy, unspecified, second trimester: Secondary | ICD-10-CM

## 2013-05-13 DIAGNOSIS — Z331 Pregnant state, incidental: Secondary | ICD-10-CM

## 2013-05-13 DIAGNOSIS — F411 Generalized anxiety disorder: Secondary | ICD-10-CM

## 2013-05-13 DIAGNOSIS — F419 Anxiety disorder, unspecified: Secondary | ICD-10-CM

## 2013-05-13 MED ORDER — PROMETHAZINE HCL 25 MG PO TABS: 25.0000 mg | ORAL_TABLET | Freq: Four times a day (QID) | ORAL | Status: DC | PRN

## 2013-05-13 MED ORDER — SUMATRIPTAN SUCCINATE 100 MG PO TABS: 100.0000 mg | ORAL_TABLET | Freq: Once | ORAL | Status: DC | PRN

## 2013-05-13 NOTE — Patient Instructions (Addendum)
Migraine Headache A migraine headache is an intense, throbbing pain on one or both sides of your head. A migraine can last for 30 minutes to several hours. CAUSES  The exact cause of a migraine headache is not always known. However, a migraine may be caused when nerves in the brain become irritated and release chemicals that cause inflammation. This causes pain. SYMPTOMS  Pain on one or both sides of your head.  Pulsating or throbbing pain.  Severe pain that prevents daily activities.  Pain that is aggravated by any physical activity.  Nausea, vomiting, or both.  Dizziness.  Pain with exposure to bright lights, loud noises, or activity.  General sensitivity to bright lights, loud noises, or smells. Before you get a migraine, you may get warning signs that a migraine is coming (aura). An aura may include:  Seeing flashing lights.  Seeing bright spots, halos, or zig-zag lines.  Having tunnel vision or blurred vision.  Having feelings of numbness or tingling.  Having trouble talking.  Having muscle weakness. MIGRAINE TRIGGERS  Alcohol.  Smoking.  Stress.  Menstruation.  Aged cheeses.  Foods or drinks that contain nitrates, glutamate, aspartame, or tyramine.  Lack of sleep.  Chocolate.  Caffeine.  Hunger.  Physical exertion.  Fatigue.  Medicines used to treat chest pain (nitroglycerine), birth control pills, estrogen, and some blood pressure medicines. DIAGNOSIS  A migraine headache is often diagnosed based on:  Symptoms.  Physical examination.  A CT scan or MRI of your head. TREATMENT Medicines may be given for pain and nausea. Medicines can also be given to help prevent recurrent migraines.  HOME CARE INSTRUCTIONS  Only take over-the-counter or prescription medicines for pain or discomfort as directed by your caregiver. The use of long-term narcotics is not recommended.  Lie down in a dark, quiet room when you have a migraine.  Keep a journal  to find out what may trigger your migraine headaches. For example, write down:  What you eat and drink.  How much sleep you get.  Any change to your diet or medicines.  Limit alcohol consumption.  Quit smoking if you smoke.  Get 7 to 9 hours of sleep, or as recommended by your caregiver.  Limit stress.  Keep lights dim if bright lights bother you and make your migraines worse. SEEK IMMEDIATE MEDICAL CARE IF:   Your migraine becomes severe.  You have a fever.  You have a stiff neck.  You have vision loss.  You have muscular weakness or loss of muscle control.  You start losing your balance or have trouble walking.  You feel faint or pass out.  You have severe symptoms that are different from your first symptoms. MAKE SURE YOU:   Understand these instructions.  Will watch your condition.  Will get help right away if you are not doing well or get worse. Document Released: 07/24/2005 Document Revised: 10/16/2011 Document Reviewed: 07/14/2011 [employer] Patient Information 2014 [employer].  

## 2013-05-13 NOTE — Progress Notes (Signed)
Diagnosis: Migraine without aura, Insomnia, Anxiety, Pregnancy, Type 11 Diabetes  History: Krystal [name] y.o. G2P1001 is 25 weeks and 1 day pregnant. She presents to [facility] office for consultation on her migraine haedaches. She is currently having a daily headache of some sort ever since she has been pregnant. She has had migraine since she was a teen. She is also a type 2 diabetic since she was a teen now on Metforman and glyburide. She  Keeps her sugars under 100 for the most part and has not had a recent A1C. She admits she is stressed with a 25 year old, husband who works 6 days a week from morning to night, money issues and little family support.     Location: Bilateral temples and Occipital areas  Number of Headache days/month: Has a headache of some sort daily Severe: 5 Moderate:10 Mild:10 plus  Current Outpatient Prescriptions on File Prior to Visit  Medication Sig Dispense Refill  . acetaminophen (TYLENOL) 500 MG tablet Take 500 mg by mouth every 6 (six) hours as needed for pain (stomach pain).      . Blood Glucose Monitoring Suppl (ACCU-CHEK NANO SMARTVIEW) W/DEVICE KIT 1 kit by Does not apply route once. Please check blood sugars twice daily  1 kit  0  . glucose blood (ACCU-CHEK ACTIVE STRIPS) test strip Check Blood sugars four times a day  100 each  12  . glyBURIDE (DIABETA) 5 MG tablet TAKE 1 TABLET BY MOUTH IN THE MORNING AND 2 TABLETS BY MOUTH IN THE EVENING  90 tablet  11  . hydrOXYzine (ATARAX/VISTARIL) 25 MG tablet Take 1-2 tablets (25-50 mg total) by mouth every 6 (six) hours as needed for itching.  60 tablet  2  . Lancet Devices (ACCU-CHEK SOFTCLIX) lancets Check Blood sugar four times a day  1 each  11  . metFORMIN (GLUCOPHAGE) 1000 MG tablet Take 1 tablet (1,000 mg total) by mouth 2 (two) times daily with a meal.  30 tablet  11  . ondansetron (ZOFRAN) 4 MG tablet Take 1 tablet (4 mg total) by mouth every 6 (six) hours.  12 tablet  0  . permethrin (ACTICIN) 5  % cream Apply topically once.  60 g  0  . Prenatal Vit-Fe Fumarate-FA (PRENATAL MULTIVITAMIN) TABS tablet Take 1 tablet by mouth daily at 12 noon.       No current facility-administered medications on file prior to visit.    Acute/ prevention: tylenol  Past Medical History  Diagnosis Date  . Diabetes mellitus   . Depression bi-polar  . Obesity    Past Surgical History  Procedure Laterality Date  . No past surgeries    . Mouth surgery     Family History  Problem Relation Age of Onset  . Asthma Mother   . Diabetes Mother   . Alcohol abuse Mother    Social History:  reports that she has been smoking Cigarettes.  She has a 1.5 pack-year smoking history. She has never used smokeless tobacco. She reports that she does not drink alcohol or use illicit drugs. Allergies: No Known Allergies  Triggers: Stress  Birth control: Pregnancy now, plans BCPs after delivery, plans another pregnancy after 5 years  ROS: positive for migraine headache daily, insomnia, anxiety, diabetes, obesity. Negative for HTN, chest pains, SOB  Exam: Well developed, Obese Caucasian female 27 YO  General:NAD HEENT: Negative Cardiac:RRR Lungs:Clear Neuro:Negative Skin:Warm and dry   Procedure: 4cc lidocaine, 4 cc marcaine, 2 cc dexamethazone. Injected with  1cc each site in Bil Occipital, Bil Temple and bilateral traps. Pt tolerated procedure well. Advised to ive today   Impression:migraine - common and daily Insomnia Anxiety Pregnancy Diabetes Type 2  Plan: Discussed the pathophysiology of migraine and the risk benefits of medications. At this point with a daily headache she is willing to assume these risks. We did Trigger Point Injections and that was very helpful. She is unwilling to go on Effexor now. She will use Phenergan 1/2 or 1 tab at bedtime for nausea, sleep and headache. She will take Imitrex when she needs it for the severe migraines. RTC 2 weeks or prn and we may need to repeat the  TPI.   Time Spent: 45 min

## 2013-05-26 ENCOUNTER — Ambulatory Visit (INDEPENDENT_AMBULATORY_CARE_PROVIDER_SITE_OTHER): Payer: Medicaid Other | Admitting: Obstetrics and Gynecology

## 2013-05-26 ENCOUNTER — Encounter: Payer: Self-pay | Admitting: Obstetrics and Gynecology

## 2013-05-26 VITALS — BP 123/73 | Temp 97.1°F | Wt 288.0 lb

## 2013-05-26 DIAGNOSIS — O0990 Supervision of high risk pregnancy, unspecified, unspecified trimester: Secondary | ICD-10-CM

## 2013-05-26 DIAGNOSIS — F329 Major depressive disorder, single episode, unspecified: Secondary | ICD-10-CM

## 2013-05-26 DIAGNOSIS — E119 Type 2 diabetes mellitus without complications: Secondary | ICD-10-CM

## 2013-05-26 DIAGNOSIS — O09293 Supervision of pregnancy with other poor reproductive or obstetric history, third trimester: Secondary | ICD-10-CM

## 2013-05-26 DIAGNOSIS — O09299 Supervision of pregnancy with other poor reproductive or obstetric history, unspecified trimester: Secondary | ICD-10-CM

## 2013-05-26 DIAGNOSIS — O24919 Unspecified diabetes mellitus in pregnancy, unspecified trimester: Secondary | ICD-10-CM

## 2013-05-26 DIAGNOSIS — O24913 Unspecified diabetes mellitus in pregnancy, third trimester: Secondary | ICD-10-CM

## 2013-05-26 LAB — POCT URINALYSIS DIP (DEVICE)
Bilirubin Urine: NEGATIVE
Glucose, UA: NEGATIVE mg/dL
Hgb urine dipstick: NEGATIVE
Leukocytes, UA: NEGATIVE
Nitrite: NEGATIVE
Protein, ur: NEGATIVE mg/dL
Specific Gravity, Urine: >=1.03 (ref 1.005–1.030)

## 2013-05-26 LAB — CBC
Hemoglobin: 11.5 g/dL — ABNORMAL LOW (ref 12.0–15.0)
MCH: 29.2 pg (ref 26.0–34.0)
MCHC: 34.6 g/dL (ref 30.0–36.0)
RBC: 3.94 MIL/uL (ref 3.87–5.11)
WBC: 11.6 10*3/uL — ABNORMAL HIGH (ref 4.0–10.5)

## 2013-05-26 LAB — RPR

## 2013-05-26 NOTE — Progress Notes (Signed)
Patient doing well without complaints. Was seen by Jannifer Rodney on 10/7 and received steroid injection for migraine. Since that visit her CBG's have been elevated fasting 101-140 2hr pp 99-146. Will monitor CBG for the next 2 weeks on current glyburide/metformin regimen. If still elevated, patient aware that she will be started on insulin.  FM/PTL precautions reviewed Patient had questions on c-section and recovery time- she is contemplating c-section given history of shoulder dystocia

## 2013-05-26 NOTE — Progress Notes (Signed)
Pulse = 112 

## 2013-05-27 LAB — HIV ANTIBODY (ROUTINE TESTING W REFLEX): HIV: NONREACTIVE

## 2013-06-03 ENCOUNTER — Encounter: Payer: Medicaid Other | Admitting: Nurse Practitioner

## 2013-06-09 ENCOUNTER — Ambulatory Visit (INDEPENDENT_AMBULATORY_CARE_PROVIDER_SITE_OTHER): Payer: Medicaid Other | Admitting: Family Medicine

## 2013-06-09 VITALS — BP 121/88 | Wt 290.5 lb

## 2013-06-09 DIAGNOSIS — O24913 Unspecified diabetes mellitus in pregnancy, third trimester: Secondary | ICD-10-CM

## 2013-06-09 DIAGNOSIS — O0990 Supervision of high risk pregnancy, unspecified, unspecified trimester: Secondary | ICD-10-CM

## 2013-06-09 DIAGNOSIS — O24919 Unspecified diabetes mellitus in pregnancy, unspecified trimester: Secondary | ICD-10-CM

## 2013-06-09 DIAGNOSIS — O09299 Supervision of pregnancy with other poor reproductive or obstetric history, unspecified trimester: Secondary | ICD-10-CM

## 2013-06-09 DIAGNOSIS — O09293 Supervision of pregnancy with other poor reproductive or obstetric history, third trimester: Secondary | ICD-10-CM

## 2013-06-09 LAB — POCT URINALYSIS DIP (DEVICE)
Bilirubin Urine: NEGATIVE
Glucose, UA: NEGATIVE mg/dL
Ketones, ur: 40 mg/dL — AB
Leukocytes, UA: NEGATIVE
Nitrite: NEGATIVE
pH: 6.5 (ref 5.0–8.0)

## 2013-06-09 MED ORDER — SYRINGE (DISPOSABLE) 1 ML MISC: 1.0000 | Freq: Three times a day (TID) | Status: DC

## 2013-06-09 MED ORDER — RANITIDINE HCL 150 MG PO TABS: 150.0000 mg | ORAL_TABLET | Freq: Two times a day (BID) | ORAL | Status: DC

## 2013-06-09 MED ORDER — INSULIN REGULAR HUMAN 100 UNIT/ML IJ SOLN: INTRAMUSCULAR | Status: DC

## 2013-06-09 MED ORDER — INSULIN NPH (HUMAN) (ISOPHANE) 100 UNIT/ML ~~LOC~~ SUSP: SUBCUTANEOUS | Status: DC

## 2013-06-09 NOTE — Progress Notes (Signed)
27 yo G2P1001 @ [redacted]w[redacted]d  - HRC for class B DM - on metformin and glyburide Fasting: 95-130 Breakfast: 109-140 Lunch: 98-140 Dinner: 106-131  Also having heartburn. 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

## 2013-06-09 NOTE — Progress Notes (Signed)
Pulse: 117

## 2013-06-09 NOTE — Patient Instructions (Addendum)
1) in the AM, mix 40U of NPH insulin with 20U of regular insulin and take with breakfast 2) at dinner time, take 20U of regular insulin 3) at bed time, take 20U of NPH insulin  Stop the metformin and glyburide

## 2013-06-16 ENCOUNTER — Encounter: Payer: Medicaid Other | Admitting: Obstetrics and Gynecology

## 2013-06-23 ENCOUNTER — Encounter: Payer: Medicaid Other | Attending: Obstetrics & Gynecology | Admitting: *Deleted

## 2013-06-23 ENCOUNTER — Ambulatory Visit (INDEPENDENT_AMBULATORY_CARE_PROVIDER_SITE_OTHER): Payer: Medicaid Other | Admitting: Obstetrics & Gynecology

## 2013-06-23 VITALS — BP 135/80 | Temp 97.5°F | Wt 293.1 lb

## 2013-06-23 DIAGNOSIS — O24919 Unspecified diabetes mellitus in pregnancy, unspecified trimester: Secondary | ICD-10-CM

## 2013-06-23 DIAGNOSIS — O09299 Supervision of pregnancy with other poor reproductive or obstetric history, unspecified trimester: Secondary | ICD-10-CM

## 2013-06-23 DIAGNOSIS — O99810 Abnormal glucose complicating pregnancy: Secondary | ICD-10-CM | POA: Insufficient documentation

## 2013-06-23 DIAGNOSIS — O09293 Supervision of pregnancy with other poor reproductive or obstetric history, third trimester: Secondary | ICD-10-CM

## 2013-06-23 DIAGNOSIS — Z713 Dietary counseling and surveillance: Secondary | ICD-10-CM | POA: Insufficient documentation

## 2013-06-23 LAB — POCT URINALYSIS DIP (DEVICE)
Bilirubin Urine: NEGATIVE
Glucose, UA: NEGATIVE mg/dL
Leukocytes, UA: NEGATIVE
Protein, ur: NEGATIVE mg/dL
Specific Gravity, Urine: 1.025 (ref 1.005–1.030)

## 2013-06-23 MED ORDER — INSULIN REGULAR HUMAN 100 UNIT/ML IJ SOLN: INTRAMUSCULAR | Status: DC

## 2013-06-23 MED ORDER — NICOTINE 21 MG/24HR TD PT24: 21.0000 mg | MEDICATED_PATCH | Freq: Every day | TRANSDERMAL | Status: DC

## 2013-06-23 MED ORDER — INSULIN NPH (HUMAN) (ISOPHANE) 100 UNIT/ML ~~LOC~~ SUSP: SUBCUTANEOUS | Status: DC

## 2013-06-23 NOTE — Progress Notes (Signed)
fastings 95-137.  2 hr after breakfast 109-200 2 hr after lunch 99-169, 2 hr after dinner 120s-160s. Increase NPH at night to 24.  Increase NPH in am 44.  Increase reg am to 22. Increase regualr b/f diner to 22. Pt ate chicken biscuit for breakfast today.  Pt understands this is not a good choice. Pt has history of shoulder dystocia (moderate) with suprapubic pressure and Shanda Bumps, CNM).  Needs [place] for growth and ordered today. Starting Nicoderm patches 21 mcg for 6 weeks then ween.

## 2013-06-23 NOTE — Progress Notes (Signed)
DIABETES: Patient was started on insulin on 06/09/13. She received administration instruction from pharmacist at her pharmacy. She has been injecting into her abdomen. She has requested alternate site direction of which I provided. She was able to verbalize proper procedure for drawing insulin and identifying the appropriate dose. Her NPH and regular were increased today due to high readings and poor food choices.

## 2013-06-23 NOTE — Progress Notes (Signed)
P= 110 C/o of intermittent lower abdominal/pelvic pain and pressure. Edema noted especially at night in legs and feet, states face is edametous in the morning.

## 2013-06-23 NOTE — Progress Notes (Signed)
Ultrasound scheduled for 06/26/13.  Appointments given for NST/BPP and OB f/u on 06/30/13; NST and OB f/u on 07/07/13 and NST on 07/10/13.

## 2013-06-24 ENCOUNTER — Other Ambulatory Visit: Payer: Self-pay | Admitting: Obstetrics & Gynecology

## 2013-06-24 ENCOUNTER — Telehealth: Payer: Self-pay | Admitting: *Deleted

## 2013-06-24 DIAGNOSIS — O24913 Unspecified diabetes mellitus in pregnancy, third trimester: Secondary | ICD-10-CM

## 2013-06-24 NOTE — Telephone Encounter (Signed)
Called pt and informed her of need for BPP next week on 11/28 @ 0845. This will serve as second fetal testing appt for the week. Pt agreed and voiced understanding.

## 2013-06-26 ENCOUNTER — Other Ambulatory Visit: Payer: Self-pay | Admitting: Obstetrics & Gynecology

## 2013-06-26 ENCOUNTER — Ambulatory Visit (HOSPITAL_COMMUNITY)
Admission: RE | Admit: 2013-06-26 | Discharge: 2013-06-26 | Disposition: A | Discharge status: Home / Self Care | Payer: Medicaid Other | Source: Ambulatory Visit | Attending: Obstetrics & Gynecology | Admitting: Obstetrics & Gynecology

## 2013-06-26 DIAGNOSIS — O09293 Supervision of pregnancy with other poor reproductive or obstetric history, third trimester: Secondary | ICD-10-CM

## 2013-06-26 DIAGNOSIS — E669 Obesity, unspecified: Secondary | ICD-10-CM | POA: Insufficient documentation

## 2013-06-26 DIAGNOSIS — O24919 Unspecified diabetes mellitus in pregnancy, unspecified trimester: Secondary | ICD-10-CM | POA: Insufficient documentation

## 2013-06-26 DIAGNOSIS — O99330 Smoking (tobacco) complicating pregnancy, unspecified trimester: Secondary | ICD-10-CM | POA: Insufficient documentation

## 2013-06-29 ENCOUNTER — Other Ambulatory Visit: Payer: Self-pay | Admitting: Obstetrics & Gynecology

## 2013-06-29 DIAGNOSIS — O24419 Gestational diabetes mellitus in pregnancy, unspecified control: Secondary | ICD-10-CM

## 2013-06-30 ENCOUNTER — Ambulatory Visit (INDEPENDENT_AMBULATORY_CARE_PROVIDER_SITE_OTHER): Payer: Medicaid Other | Admitting: Family Medicine

## 2013-06-30 VITALS — BP 112/64 | Wt 295.2 lb

## 2013-06-30 DIAGNOSIS — O24919 Unspecified diabetes mellitus in pregnancy, unspecified trimester: Secondary | ICD-10-CM

## 2013-06-30 DIAGNOSIS — O0990 Supervision of high risk pregnancy, unspecified, unspecified trimester: Secondary | ICD-10-CM

## 2013-06-30 DIAGNOSIS — O24913 Unspecified diabetes mellitus in pregnancy, third trimester: Secondary | ICD-10-CM

## 2013-06-30 LAB — POCT URINALYSIS DIP (DEVICE)
Bilirubin Urine: NEGATIVE
Glucose, UA: NEGATIVE mg/dL
Hgb urine dipstick: NEGATIVE
Ketones, ur: 40 mg/dL — AB
Specific Gravity, Urine: 1.025 (ref 1.005–1.030)
Urobilinogen, UA: 0.2 mg/dL (ref 0.0–1.0)

## 2013-06-30 MED ORDER — INSULIN NPH (HUMAN) (ISOPHANE) 100 UNIT/ML ~~LOC~~ SUSP: SUBCUTANEOUS | Status: DC

## 2013-06-30 NOTE — Progress Notes (Signed)
NST reviewed and reactive. FBS 109-142 none in range 2 hour pp 116-176 majority in range U/S 2219 gms 4 lb 14 oz (84%) vtx, AFI 14.6 Continue 2x/wk testing Increase pm NPH to 28 u--check 3 am BS and do not increase if low--add bedtime snack.

## 2013-06-30 NOTE — Progress Notes (Signed)
P= 109 Pt c/o of left sided started yesterday, dull pain continues today  Pain scale 3. Lower abd pressure/back when getting up from sitting position.

## 2013-06-30 NOTE — Patient Instructions (Signed)
Diabetes mellitus gestacional  (Gestational Diabetes Mellitus) La diabetes mellitus gestacional, ms comnmente conocida como diabetes gestacional es un tipo de diabetes que desarrollan algunas mujeres durante el [place]. En la diabetes gestacional, el pncreas no produce suficiente insulina (una hormona), las clulas son menos sensibles a la insulina que se produce (resistencia a la insulina), o ambos.Normalmente, la [employer] azcares de los alimentos a las clulas de los tejidos. Las clulas de los tejidos [employer] azcares para Dealer. La falta de insulina o la falta de una respuesta normal a la insulina hace que el exceso de azcar se acumule en la sangre en lugar de Location manager en las clulas de los tejidos. Como resultado, se desarrollan los niveles altos de Dispensing optician (hiperglucemia). El efecto de los niveles altos de azcar (glucosa) puede causar muchas complicaciones.  FACTORES DE RIESGO Usted tiene mayor probabilidad de desarrollar diabetes gestacional si tiene antecedentes familiares de diabetes y tambin si tiene uno o ms de los siguientes factores de riesgo:   ndice de masa corporal superior a 30 (obesidad).  Embarazo previo con diabetes gestacional.  Mayor edad en el momento del [place]. Si se mantienen los niveles de Multimedia programmer en un rango normal durante el [place], las mujeres pueden tener un embarazo saludable. Si no controla bien sus niveles de glucosa en sangre, pueden tener tener riesgos usted, su beb antes de nacer (feto), el [place] de parto y [place], o el beb recin nacido.  SNTOMAS  Si se presentan sntomas, stos son similares a los sntomas que normalmente experimentar durante el embarazo. Los sntomas de la diabetes gestacional son:   Lovey Newcomer de la sed (polidipsia).  Aumento de ganas de Garment/textile technologist (poliuria).  Aumento de ganas de orinar durante la noche (nicturia).  Prdida de peso. Prdida de [employer] puede ser muy  rpida.  Infecciones frecuentes y recurrentes.  Cansancio (fatiga).  Debilidad.  Cambios en la visin, como visin borrosa.  Olor a Medical illustrator.  Dolor abdominal. DIAGNSTICO La diabetes se diagnostica cuando hay aumento de los niveles de glucosa en la [place]. El nivel de glucosa en la sangre puede controlarse en uno o ms de los siguientes anlisis de sangre:   Medicin de glucosa en sangre en ayunas. No deber comer durante al menos 8 horas antes de que se tome la [place].  Anlisis al azar de glucosa en sangre. El nivel de glucosa en sangre se controla en cualquier momento del da sin importar el momento en que haya comido.  Pruebas de glucosa de sangre de hemoglobina A1c. Un anlisis de la hemoglobina A1c proporciona informacin sobre el control de la glucosa en la sangre durante los ltimos 3 meses.  Prueba oral de tolerancia a la glucosa (SOG). La glucosa en la sangre se mide despus de no haber comido (ayuno) durante 1-3 horas y despus de beber una bebida que contiene glucosa. Dado que las hormonas que causan la resistencia a la insulina son ms altas alrededor de las semanas 24-28 de [place], generalmente se realiza un SOG durante ese [place]. Si tiene factores de riesgo para la diabetes gestacional, su mdico puede detectarla antes de las 24 semanas de [place]. TRATAMIENTO   Usted tendr que tomar medicamentos para la diabetes o insulina diariamente para Theatre manager los niveles de glucosa en sangre en el rango deseado.  Usted tendr [employer] coincidir la dosis de insulina con el ejercicio y Marine scientist de alimentos saludables. El [place] del tratamiento es  en el rango deseado.  · Usted tendrá que hacer coincidir la dosis de insulina con el ejercicio y la elección de alimentos saludables.  El objetivo del tratamiento es mantener el nivel de glucosa en sangre en 60-99 mg / dl antes de la comida (preprandial), a la hora de acostarse y durante la noche mientras dure su embarazo. El objetivo del tratamiento es mantener el mayor pico de azúcar en sangre después de la comida (glucosa postprandial) en 100-140 mg/dl.   INSTRUCCIONES  PARA EL CUIDADO EN EL HOGAR   · Haga que su nivel de hemoglobina A1c sea verificado dos veces al año.  · Realice un control diario de glucosa en sangre según las indicaciones de su médico. Es común realizar controles con frecuencia de la glucosa en sangre.  · Supervise las cetonas en la orina cuando esté enferma y según las indicaciones de su médico.  · Tome su medicamento para la diabetes y la insulina según las indicaciones de su médico para mantener el nivel de glucosa en sangre en su rango deseado.  · Nunca se quede sin medicamentos para la diabetes o sin insulina. Es necesario recibirla todos los días.  · Ajuste la insulina sobre la base de la ingesta de hidratos de carbono. Los hidratos de carbono pueden aumentar los niveles de glucosa en la sangre, pero deben incluirse en su dieta. Los hidratos de carbono aportan vitaminas, minerales y fibra que son una parte esencial de una dieta saludable. Los hidratos de carbono se encuentran en frutas, verduras, granos enteros, productos lácteos, legumbres y alimentos que contienen azúcares añadidos.  ·   · Consuma alimentos saludables. Alterne 3 comidas con 3 colaciones.  · Mantenga un aumento de peso saludable. El aumento del peso total varía de acuerdo con el índice de masa corporal antes del embarazo (IMC).  · Lleve una tarjeta de alerta médica o lleve una pulsera de alerta médica.  · Lleve consigo un refrigerio de 15 gramos de hidrato de carbonos en todo momento para controlar los niveles bajos de glucosa en sangre (hipoglucemia). Algunos ejemplos de aperitivos de 15 gramos de hidratos de carbono son:  · Tabletas de glucosa, 3 o 4  ·   Gel de glucosa, tubo de 15 gramos  · Pasas de uva, 2 cucharadas (24 g)  · Caramelos de goma, 6  · Galletas de animales, 8  · Jugo de fruta, soda regular, o leche baja en grasa, 4 onzas (120 ml)  · Pastillas gomitas, 9  ·   · Reconocer la hipoglucemia. Durante el embarazo la hipoglucemia se produce cuando hay niveles de glucosa en  sangre de 60 mg/dl o menos. El riesgo de hipoglucemia aumenta durante el ayuno o saltarse las comidas, durante o después del ejercicio intenso, y durante el sueño. Los síntomas de hipoglucemia son:  · Temblores o sacudidas.  · Disminución de capacidad de concentración.  · Sudoración.  · Aumento en el ritmo cardíaco  · Dolor de cabeza.  · Boca seca.  · Hambre.  · Irritabilidad.  · Ansiedad.  · Sueño agitado.  · Alteración del habla o de la coordinación.  · Confusión.  · Tratar la hipoglucemia rápidamente. Si usted está alerta y puede tragar con seguridad, siga la regla de 15:15:  · Tome de 15 a 20 gramos de glucosa de acción rápida o hidratos de carbono . Las opciones de acción rápida son un gel de glucosa, unas tabletas de glucosa, o 4 onzas (120 ml) de jugo de frutas, soda   niveles de glucosa en la sangre vuelven a la normalidad.  Est alerta a la poliuria y polidipsia, que son los primeros signos de hiperglucemia. El conocimiento temprano de la hiperglucemia permite un tratamiento oportuno. Controle la hiperglucemia segn le indic su mdico.  Haga actividad fsica por lo menos 30 minutos al da o como lo indique su mdico. Se recomienda diez minutos de actividad fsica cronometrados 30 minutos despus de cada comida para Chief Operating Officer los niveles de glucosa en sangre postprandial.  Ajuste su dosis de insulina y la ingesta de alimentos, segn sea necesario, si se inicia un nuevo ejercicio o deporte.  Siga su plan diario de enfermo en algn momento que no puede comer o beber como de costumbre.  Evite el tabaco y el alcohol.  Concurra regularmente a las visitas de control con el mdico.  Siga el consejo  de su mdico respecto a los controles prenatales y posteriores al parto (post-parto), a la planificacin de las comidas, al ejercicio, a los [address], a las vitaminas, a los anlisis de [place], a otras pruebas mdicas y fsicas.  Cuide diariamente la piel y los pies. Examine su piel y los pies diariamente para detectar cortes, moretones, enrojecimiento, problemas en las uas, sangrado, ampollas o llagas.  Cepllese los dientes y encas por lo menos dos veces al da y use hilo dental al menos una vez por da. Concurra regularmente a las visitas de control con el dentista.  Programe un examen de vista durante el primer trimestre de su embarazo o como lo indique su mdico.  Comparta su plan de control de diabetes en su trabajo o en la escuela.  Mantngase al da con las vacunas.  Aprenda a Dealer.  Obtenga educacin continuada y ayuda para la diabetes cuando sea necesario. SOLICITE ATENCIN MDICA SI:   No puede comer alimentos o beber por ms de 6 horas.  Tiene nuseas o ha vomitado durante ms de 6 horas.  Tiene un nivel de glucosa en sangre de 200 mg/dl y [employer].  Presenta algn cambio en el estado mental.  Tiene problemas de visin.  Sufre un dolor persistente de Training and development officer.  Tiene dolor o molestias en el abdomen.  Desarrolla una enfermedad grave adicional.  Tiene diarrea durante ms de 6 horas.  Ha estado enferma o ha tenido fiebre durante un par [address] y no mejora. SOLICITE ATENCIN MDICA DE INMEDIATO SI:   Tiene dificultad para respirar.  Ya no siente los movimientos del beb.  Est sangrando o tiene flujo vaginal.  Comienza a tener contracciones o trabajo de [place] prematuro. ASEGRESE DE QUE:  Comprende estas instrucciones.  Controlar su enfermedad.  Solicitar ayuda de inmediato si no mejora o si empeora. Document Released: 05/03/2005 Document Revised: 04/17/2012 [facility] Patient Information 2014 [place],  [place].  Breastfeeding Deciding to breastfeed is one of the best choices you can make for you and your baby. A change in hormones during pregnancy causes your breast tissue to grow and increases the number and size of your milk ducts. These hormones also allow proteins, sugars, and fats from your blood supply to make breast milk in your milk-producing glands. Hormones prevent breast milk from being released before your baby is born as well as prompt milk flow after birth. Once breastfeeding has begun, thoughts of your baby, as well as his or her sucking or crying, can stimulate the release of milk from your milk-producing glands.  BENEFITS OF BREASTFEEDING For Your Baby  Your first milk (colostrum) helps  your baby's digestive system function better.   There are antibodies in your milk that help your baby fight off infections.   Your baby has a lower incidence of asthma, allergies, and sudden infant death syndrome.   The nutrients in breast milk are better for your baby than infant formulas and are designed uniquely for your baby's needs.   Breast milk improves your baby's brain development.   Your baby is less likely to develop other conditions, such as childhood obesity, asthma, or type 2 diabetes mellitus.  For You   Breastfeeding helps to create a very special bond between you and your baby.   Breastfeeding is convenient. Breast milk is always available at the correct temperature and costs nothing.   Breastfeeding helps to burn calories and helps you lose the weight gained during pregnancy.   Breastfeeding makes your uterus contract to its prepregnancy size faster and slows bleeding (lochia) after you give birth.   Breastfeeding helps to lower your risk of developing type 2 diabetes mellitus, osteoporosis, and breast or ovarian cancer later in life. SIGNS THAT YOUR BABY IS HUNGRY Early Signs of Hunger  Increased alertness or activity.  Stretching.  Movement of the head  from side to side.  Movement of the head and opening of the mouth when the corner of the mouth or cheek is stroked (rooting).  Increased sucking sounds, smacking lips, cooing, sighing, or squeaking.  Hand-to-mouth movements.  Increased sucking of fingers or hands. Late Signs of Hunger  Fussing.  Intermittent crying. Extreme Signs of Hunger Signs of extreme hunger will require calming and consoling before your baby will be able to breastfeed successfully. Do not wait for the following signs of extreme hunger to occur before you initiate breastfeeding:   Restlessness.  A loud, strong cry.   Screaming. BREASTFEEDING BASICS Breastfeeding Initiation  Find a comfortable place to sit or lie down, with your neck and back well supported.  Place a pillow or rolled up blanket under your baby to bring him or her to the level of your breast (if you are seated). Nursing pillows are specially designed to help support your arms and your baby while you breastfeed.  Make sure that your baby's abdomen is facing your abdomen.   Gently massage your breast. With your fingertips, massage from your chest wall toward your nipple in a circular motion. This encourages milk flow. You may need to continue this action during the feeding if your milk flows slowly.  Support your breast with 4 fingers underneath and your thumb above your nipple. Make sure your fingers are well away from your nipple and your baby's mouth.   Stroke your baby's lips gently with your finger or nipple.   When your baby's mouth is open wide enough, quickly bring your baby to your breast, placing your entire nipple and as much of the colored area around your nipple (areola) as possible into your baby's mouth.   More areola should be visible above your baby's upper lip than below the lower lip.   Your baby's tongue should be between his or her lower gum and your breast.   Ensure that your baby's mouth is correctly  positioned around your nipple (latched). Your baby's lips should create a seal on your breast and be turned out (everted).  It is common for your baby to suck about 2 3 minutes in order to start the flow of breast milk. Latching Teaching your baby how to latch on to your breast properly is  very important. An improper latch can cause nipple pain and decreased milk supply for you and poor weight gain in your baby. Also, if your baby is not latched onto your nipple properly, he or she may swallow some air during feeding. This can make your baby fussy. Burping your baby when you switch breasts during the feeding can help to get rid of the air. However, teaching your baby to latch on properly is still the best way to prevent fussiness from swallowing air while breastfeeding. Signs that your baby has successfully latched on to your nipple:    Silent tugging or silent sucking, without causing you pain.   Swallowing heard between every 3 4 sucks.    Muscle movement above and in front of his or her ears while sucking.  Signs that your baby has not successfully latched on to nipple:   Sucking sounds or smacking sounds from your baby while breastfeeding.  Nipple pain. If you think your baby has not latched on correctly, slip your finger into the corner of your baby's mouth to break the suction and place it between your baby's gums. Attempt breastfeeding initiation again. Signs of Successful Breastfeeding Signs from your baby:   A gradual decrease in the number of sucks or complete cessation of sucking.   Falling asleep.   Relaxation of his or her body.   Retention of a small amount of milk in his or her mouth.   Letting go of your breast by himself or herself. Signs from you:  Breasts that have increased in firmness, weight, and size 1 3 hours after feeding.   Breasts that are softer immediately after breastfeeding.  Increased milk volume, as well as a change in milk consistency  and color by the 5th day of breastfeeding.   Nipples that are not sore, cracked, or bleeding. Signs That Your Pecola Leisure is Getting Enough Milk  Wetting at least 3 diapers in a 24-hour period. The urine should be clear and pale yellow by age 6 days.  At least 3 stools in a 24-hour period by age 6 days. The stool should be soft and yellow.  At least 3 stools in a 24-hour period by age 417 days. The stool should be seedy and yellow.  No loss of weight greater than 10% of birth weight during the first 55 days of age.  Average weight gain of 4 7 ounces (120 210 mL) per week after age 33 days.  Consistent daily weight gain by age 6 days, without weight loss after the age of 2 weeks. After a feeding, your baby may spit up a small amount. This is common. BREASTFEEDING FREQUENCY AND DURATION Frequent feeding will help you make more milk and can prevent sore nipples and breast engorgement. Breastfeed when you feel the need to reduce the fullness of your breasts or when your baby shows signs of hunger. This is called "breastfeeding on demand." Avoid introducing a pacifier to your baby while you are working to establish breastfeeding (the first 4 6 weeks after your baby is born). After this time you may choose to use a pacifier. Research has shown that pacifier use during the first year of a baby's life decreases the risk of sudden infant death syndrome (SIDS). Allow your baby to feed on each breast as long as he or she wants. Breastfeed until your baby is finished feeding. When your baby unlatches or falls asleep while feeding from the first breast, offer the second breast. Because newborns are often sleepy  in the first few weeks of life, you may need to awaken your baby to get him or her to feed. Breastfeeding times will vary from baby to baby. However, the following rules can serve as a guide to help you ensure that your baby is properly fed:  Newborns (babies 73 weeks of age or younger) may breastfeed every 1 3  hours.  Newborns should not go longer than 3 hours during the day or 5 hours during the night without breastfeeding.  You should breastfeed your baby a minimum of 8 times in a 24-hour period until you begin to introduce solid foods to your baby at around 47 months of age. BREAST MILK PUMPING Pumping and storing breast milk allows you to ensure that your baby is exclusively fed your breast milk, even at times when you are unable to breastfeed. This is especially important if you are going back to work while you are still breastfeeding or when you are not able to be present during feedings. Your lactation consultant can give you guidelines on how long it is safe to store breast milk.  A breast pump is a machine that allows you to pump milk from your breast into a sterile bottle. The pumped breast milk can then be stored in a refrigerator or freezer. Some breast pumps are operated by hand, while others use electricity. Ask your lactation consultant which type will work best for you. Breast pumps can be purchased, but some hospitals and breastfeeding support groups lease breast pumps on a monthly basis. A lactation consultant can teach you how to hand express breast milk, if you prefer not to use a pump.  CARING FOR YOUR BREASTS WHILE YOU BREASTFEED Nipples can become dry, cracked, and sore while breastfeeding. The following recommendations can help keep your breasts moisturized and healthy:  Avoid using soap on your nipples.   Wear a supportive bra. Although not required, special nursing bras and tank tops are designed to allow access to your breasts for breastfeeding without taking off your entire bra or top. Avoid wearing underwire style bras or extremely tight bras.  Air dry your nipples for 3 after each feeding.   Use only cotton bra pads to absorb leaked breast milk. Leaking of breast milk between feedings is normal.   Use lanolin on your nipples after breastfeeding. Lanolin helps to  maintain your skin's normal moisture barrier. If you use pure lanolin you do not need to wash it off before feeding your baby again. Pure lanolin is not toxic to your baby. You may also hand express a few drops of breast milk and gently massage that milk into your nipples and allow the milk to air dry. In the first few weeks after giving birth, some women experience extremely full breasts (engorgement). Engorgement can make your breasts feel heavy, warm, and tender to the touch. Engorgement peaks within 3 5 days after you give birth. The following recommendations can help ease engorgement:  Completely empty your breasts while breastfeeding or pumping. You may want to start by applying warm, moist heat (in the shower or with warm water-soaked hand towels) just before feeding or pumping. This increases circulation and helps the milk flow. If your baby does not completely empty your breasts while breastfeeding, pump any extra milk after he or she is finished.  Wear a snug bra (nursing or regular) or tank top for 1 2 days to signal your body to slightly decrease milk production.  Apply ice packs to your breasts,  unless this is too uncomfortable for you.  Make sure that your baby is latched on and positioned properly while breastfeeding. If engorgement persists after 48 hours of following these recommendations, contact your health care provider or a Advertising copywriter. OVERALL HEALTH CARE RECOMMENDATIONS WHILE BREASTFEEDING  Eat healthy foods. Alternate between meals and snacks, eating 3 of each per day. Because what you eat affects your breast milk, some of the foods may make your baby more irritable than usual. Avoid eating these foods if you are sure that they are negatively affecting your baby.  Drink milk, fruit juice, and water to satisfy your thirst (about 10 glasses a day).   Rest often, relax, and continue to take your prenatal vitamins to prevent fatigue, stress, and anemia.  Continue  breast self-awareness checks.  Avoid chewing and smoking tobacco.  Avoid alcohol and drug use. Some medicines that may be harmful to your baby can pass through breast milk. It is important to ask your health care provider before taking any medicine, including all over-the-counter and prescription medicine as well as vitamin and herbal supplements. It is possible to become pregnant while breastfeeding. If birth control is desired, ask your health care provider about options that will be safe for your baby. SEEK MEDICAL CARE IF:   You feel like you want to stop breastfeeding or have become frustrated with breastfeeding.  You have painful breasts or nipples.  Your nipples are cracked or bleeding.  Your breasts are red, tender, or warm.  You have a swollen area on either breast.  You have a fever or chills.  You have nausea or vomiting.  You have drainage other than breast milk from your nipples.  Your breasts do not become full before feedings by the 5th day after you give birth.  You feel sad and depressed.  Your baby is too sleepy to eat well.  Your baby is having trouble sleeping.   Your baby is wetting less than 3 diapers in a 24-hour period.  Your baby has less than 3 stools in a 24-hour period.  Your baby's skin or the white part of his or her eyes becomes yellow.   Your baby is not gaining weight by 75 days of age. SEEK IMMEDIATE MEDICAL CARE IF:   Your baby is overly tired (lethargic) and does not want to wake up and feed.  Your baby develops an unexplained fever. Document Released: 07/24/2005 Document Revised: 03/26/2013 Document Reviewed: 01/15/2013 [facility] Patient Information 2014 [place], [place].

## 2013-07-04 ENCOUNTER — Ambulatory Visit (HOSPITAL_COMMUNITY)
Admission: RE | Admit: 2013-07-04 | Discharge: 2013-07-04 | Disposition: A | Discharge status: Home / Self Care | Payer: Medicaid Other | Source: Ambulatory Visit | Attending: Obstetrics & Gynecology | Admitting: Obstetrics & Gynecology

## 2013-07-04 ENCOUNTER — Other Ambulatory Visit: Payer: Self-pay | Admitting: Obstetrics & Gynecology

## 2013-07-04 DIAGNOSIS — O24913 Unspecified diabetes mellitus in pregnancy, third trimester: Secondary | ICD-10-CM

## 2013-07-04 DIAGNOSIS — O99330 Smoking (tobacco) complicating pregnancy, unspecified trimester: Secondary | ICD-10-CM | POA: Insufficient documentation

## 2013-07-04 DIAGNOSIS — O24919 Unspecified diabetes mellitus in pregnancy, unspecified trimester: Secondary | ICD-10-CM | POA: Insufficient documentation

## 2013-07-04 DIAGNOSIS — E669 Obesity, unspecified: Secondary | ICD-10-CM | POA: Insufficient documentation

## 2013-07-07 ENCOUNTER — Ambulatory Visit (INDEPENDENT_AMBULATORY_CARE_PROVIDER_SITE_OTHER): Payer: Medicaid Other | Admitting: Obstetrics & Gynecology

## 2013-07-07 VITALS — BP 135/44 | Wt 291.1 lb

## 2013-07-07 DIAGNOSIS — O24913 Unspecified diabetes mellitus in pregnancy, third trimester: Secondary | ICD-10-CM

## 2013-07-07 DIAGNOSIS — O0990 Supervision of high risk pregnancy, unspecified, unspecified trimester: Secondary | ICD-10-CM

## 2013-07-07 DIAGNOSIS — O24919 Unspecified diabetes mellitus in pregnancy, unspecified trimester: Secondary | ICD-10-CM

## 2013-07-07 LAB — POCT URINALYSIS DIP (DEVICE)
Bilirubin Urine: NEGATIVE
Glucose, UA: NEGATIVE mg/dL
Hgb urine dipstick: NEGATIVE
Ketones, ur: NEGATIVE mg/dL
Nitrite: NEGATIVE
Urobilinogen, UA: 0.2 mg/dL (ref 0.0–1.0)
pH: 6.5 (ref 5.0–8.0)

## 2013-07-07 NOTE — Patient Instructions (Signed)
Third Trimester of Pregnancy  The third trimester is from week 29 through week 42, months 7 through 9. The third trimester is a time when the fetus is growing rapidly. At the end of the ninth month, the fetus is about 20 inches in length and weighs 6 10 pounds.   BODY CHANGES  Your body goes through many changes during pregnancy. The changes vary from woman to woman.    Your weight will continue to increase. You can expect to gain 25 35 pounds (11 16 kg) by the end of the pregnancy.   You may begin to get stretch marks on your hips, abdomen, and breasts.   You may urinate more often because the fetus is moving lower into your pelvis and pressing on your bladder.   You may develop or continue to have heartburn as a result of your pregnancy.   You may develop constipation because certain hormones are causing the muscles that push waste through your intestines to slow down.   You may develop hemorrhoids or swollen, bulging veins (varicose veins).   You may have pelvic pain because of the weight gain and pregnancy hormones relaxing your joints between the bones in your pelvis. Back aches may result from over exertion of the muscles supporting your posture.   Your breasts will continue to grow and be tender. A yellow discharge may leak from your breasts called colostrum.   Your belly button may stick out.   You may feel short of breath because of your expanding uterus.   You may notice the fetus "dropping," or moving lower in your abdomen.   You may have a bloody mucus discharge. This usually occurs a few days to a week before labor begins.   Your cervix becomes thin and soft (effaced) near your due date.  WHAT TO EXPECT AT YOUR PRENATAL EXAMS   You will have prenatal exams every 2 weeks until week 36. Then, you will have weekly prenatal exams. During a routine prenatal visit:   You will be weighed to make sure you and the fetus are growing normally.   Your blood pressure is taken.   Your abdomen will be  measured to track your baby's growth.   The fetal heartbeat will be listened to.   Any test results from the previous visit will be discussed.   You may have a cervical check near your due date to see if you have effaced.  At around 36 weeks, your caregiver will check your cervix. At the same time, your caregiver will also perform a test on the secretions of the vaginal tissue. This test is to determine if a type of bacteria, Group B streptococcus, is present. Your caregiver will explain this further.  Your caregiver may ask you:   What your birth plan is.   How you are feeling.   If you are feeling the baby move.   If you have had any abnormal symptoms, such as leaking fluid, bleeding, severe headaches, or abdominal cramping.   If you have any questions.  Other tests or screenings that may be performed during your third trimester include:   Blood tests that check for low iron levels (anemia).   Fetal testing to check the health, activity level, and growth of the fetus. Testing is done if you have certain medical conditions or if there are problems during the pregnancy.  FALSE LABOR  You may feel small, irregular contractions that eventually go away. These are called Braxton Hicks contractions, or   false labor. Contractions may last for hours, days, or even weeks before true labor sets in. If contractions come at regular intervals, intensify, or become painful, it is best to be seen by your caregiver.   SIGNS OF LABOR    Menstrual-like cramps.   Contractions that are 5 minutes apart or less.   Contractions that start on the top of the uterus and spread down to the lower abdomen and back.   A sense of increased pelvic pressure or back pain.   A watery or bloody mucus discharge that comes from the vagina.  If you have any of these signs before the 37th week of pregnancy, call your caregiver right away. You need to go to the hospital to get checked immediately.  HOME CARE INSTRUCTIONS    Avoid all  smoking, herbs, alcohol, and unprescribed drugs. These chemicals affect the formation and growth of the baby.   Follow your caregiver's instructions regarding medicine use. There are medicines that are either safe or unsafe to take during pregnancy.   Exercise only as directed by your caregiver. Experiencing uterine cramps is a good sign to stop exercising.   Continue to eat regular, healthy meals.   Wear a good support bra for breast tenderness.   Do not use hot tubs, steam rooms, or saunas.   Wear your seat belt at all times when driving.   Avoid raw meat, uncooked cheese, cat litter boxes, and soil used by cats. These carry germs that can cause birth defects in the baby.   Take your prenatal vitamins.   Try taking a stool softener (if your caregiver approves) if you develop constipation. Eat more high-fiber foods, such as fresh vegetables or fruit and whole grains. Drink plenty of fluids to keep your urine clear or pale yellow.   Take warm sitz baths to soothe any pain or discomfort caused by hemorrhoids. Use hemorrhoid cream if your caregiver approves.   If you develop varicose veins, wear support hose. Elevate your feet for 15 minutes, 3 4 times a day. Limit salt in your diet.   Avoid heavy lifting, wear low heal shoes, and practice good posture.   Rest a lot with your legs elevated if you have leg cramps or low back pain.   Visit your dentist if you have not gone during your pregnancy. Use a soft toothbrush to brush your teeth and be gentle when you floss.   A sexual relationship may be continued unless your caregiver directs you otherwise.   Do not travel far distances unless it is absolutely necessary and only with the approval of your caregiver.   Take prenatal classes to understand, practice, and ask questions about the labor and delivery.   Make a trial run to the hospital.   Pack your hospital bag.   Prepare the baby's nursery.   Continue to go to all your prenatal visits as directed  by your caregiver.  SEEK MEDICAL CARE IF:   You are unsure if you are in labor or if your water has broken.   You have dizziness.   You have mild pelvic cramps, pelvic pressure, or nagging pain in your abdominal area.   You have persistent nausea, vomiting, or diarrhea.   You have a bad smelling vaginal discharge.   You have pain with urination.  SEEK IMMEDIATE MEDICAL CARE IF:    You have a fever.   You are leaking fluid from your vagina.   You have spotting or bleeding from your vagina.     You have severe abdominal cramping or pain.   You have rapid weight loss or gain.   You have shortness of breath with chest pain.   You notice sudden or extreme swelling of your face, hands, ankles, feet, or legs.   You have not felt your baby move in over an hour.   You have severe headaches that do not go away with medicine.   You have vision changes.  Document Released: 07/18/2001 Document Revised: 03/26/2013 Document Reviewed: 09/24/2012  [employer] Patient Information 2014 [employer]

## 2013-07-07 NOTE — Progress Notes (Signed)
Reactive NST today. FBS 78-120, PP 104-125, will continue current doses. BPP 8/8 on 11/28, 84%ile growth 06/26/13

## 2013-07-07 NOTE — Progress Notes (Signed)
P-96 

## 2013-07-10 ENCOUNTER — Ambulatory Visit (INDEPENDENT_AMBULATORY_CARE_PROVIDER_SITE_OTHER): Payer: Medicaid Other | Admitting: *Deleted

## 2013-07-10 VITALS — BP 116/62

## 2013-07-10 DIAGNOSIS — O24919 Unspecified diabetes mellitus in pregnancy, unspecified trimester: Secondary | ICD-10-CM

## 2013-07-10 DIAGNOSIS — O24913 Unspecified diabetes mellitus in pregnancy, third trimester: Secondary | ICD-10-CM

## 2013-07-10 NOTE — Progress Notes (Signed)
P-116 

## 2013-07-14 ENCOUNTER — Encounter: Payer: Self-pay | Admitting: Obstetrics and Gynecology

## 2013-07-14 ENCOUNTER — Ambulatory Visit (INDEPENDENT_AMBULATORY_CARE_PROVIDER_SITE_OTHER): Payer: Medicaid Other | Admitting: Obstetrics and Gynecology

## 2013-07-14 VITALS — BP 119/58 | Wt 292.8 lb

## 2013-07-14 DIAGNOSIS — O0990 Supervision of high risk pregnancy, unspecified, unspecified trimester: Secondary | ICD-10-CM

## 2013-07-14 DIAGNOSIS — O9989 Other specified diseases and conditions complicating pregnancy, childbirth and the puerperium: Secondary | ICD-10-CM

## 2013-07-14 DIAGNOSIS — O24919 Unspecified diabetes mellitus in pregnancy, unspecified trimester: Secondary | ICD-10-CM

## 2013-07-14 DIAGNOSIS — O24913 Unspecified diabetes mellitus in pregnancy, third trimester: Secondary | ICD-10-CM

## 2013-07-14 DIAGNOSIS — O09299 Supervision of pregnancy with other poor reproductive or obstetric history, unspecified trimester: Secondary | ICD-10-CM

## 2013-07-14 DIAGNOSIS — O09293 Supervision of pregnancy with other poor reproductive or obstetric history, third trimester: Secondary | ICD-10-CM

## 2013-07-14 DIAGNOSIS — L988 Other specified disorders of the skin and subcutaneous tissue: Secondary | ICD-10-CM

## 2013-07-14 DIAGNOSIS — E119 Type 2 diabetes mellitus without complications: Secondary | ICD-10-CM

## 2013-07-14 DIAGNOSIS — O2686 Pruritic urticarial papules and plaques of pregnancy (PUPPP): Secondary | ICD-10-CM

## 2013-07-14 LAB — POCT URINALYSIS DIP (DEVICE)
Bilirubin Urine: NEGATIVE
Leukocytes, UA: NEGATIVE
Protein, ur: NEGATIVE mg/dL
Specific Gravity, Urine: >=1.03 (ref 1.005–1.030)
pH: 6.5 (ref 5.0–8.0)

## 2013-07-14 NOTE — Progress Notes (Signed)
Pulse- 100 Patient reports occasional pressure in pelvis & vagina

## 2013-07-14 NOTE — Progress Notes (Signed)
12/4 NST reviewed and reactive. 

## 2013-07-14 NOTE — Progress Notes (Signed)
NST reviewed and reactive. Patient doing well without complaints. Patient did not bring log book or meter but states highest fasting this past week was 110 and the rest was mainly in 90's. Highest postprandial was 123. Patient desires to wait for next growth scan to decide on primary cesarean section vs vaginal birth given her history of shoulder dystocia

## 2013-07-17 ENCOUNTER — Ambulatory Visit (INDEPENDENT_AMBULATORY_CARE_PROVIDER_SITE_OTHER): Payer: Medicaid Other | Admitting: *Deleted

## 2013-07-17 VITALS — BP 123/68 | Wt 295.4 lb

## 2013-07-17 DIAGNOSIS — O24913 Unspecified diabetes mellitus in pregnancy, third trimester: Secondary | ICD-10-CM

## 2013-07-17 DIAGNOSIS — O24919 Unspecified diabetes mellitus in pregnancy, unspecified trimester: Secondary | ICD-10-CM

## 2013-07-17 NOTE — Progress Notes (Signed)
P=79 

## 2013-07-20 ENCOUNTER — Inpatient Hospital Stay (HOSPITAL_COMMUNITY)
Admission: AD | Admit: 2013-07-20 | Discharge: 2013-07-20 | Disposition: A | Discharge status: Home / Self Care | Payer: Medicaid Other | Source: Ambulatory Visit | Attending: Obstetrics and Gynecology | Admitting: Obstetrics and Gynecology

## 2013-07-20 ENCOUNTER — Encounter (HOSPITAL_COMMUNITY): Payer: Self-pay | Admitting: *Deleted

## 2013-07-20 DIAGNOSIS — R7402 Elevation of levels of lactic acid dehydrogenase (LDH): Secondary | ICD-10-CM | POA: Insufficient documentation

## 2013-07-20 DIAGNOSIS — R7401 Elevation of levels of liver transaminase levels: Secondary | ICD-10-CM | POA: Insufficient documentation

## 2013-07-20 DIAGNOSIS — K219 Gastro-esophageal reflux disease without esophagitis: Secondary | ICD-10-CM

## 2013-07-20 DIAGNOSIS — O99891 Other specified diseases and conditions complicating pregnancy: Secondary | ICD-10-CM | POA: Insufficient documentation

## 2013-07-20 DIAGNOSIS — R1013 Epigastric pain: Secondary | ICD-10-CM | POA: Insufficient documentation

## 2013-07-20 DIAGNOSIS — O99330 Smoking (tobacco) complicating pregnancy, unspecified trimester: Secondary | ICD-10-CM | POA: Insufficient documentation

## 2013-07-20 LAB — CBC WITH DIFFERENTIAL/PLATELET
Basophils Absolute: 0 10*3/uL (ref 0.0–0.1)
Basophils Relative: 0 % (ref 0–1)
Eosinophils Absolute: 0.1 10*3/uL (ref 0.0–0.7)
Hemoglobin: 12 g/dL (ref 12.0–15.0)
Lymphs Abs: 1.6 10*3/uL (ref 0.7–4.0)
MCH: 28.2 pg (ref 26.0–34.0)
MCHC: 33.3 g/dL (ref 30.0–36.0)
MCV: 84.5 fL (ref 78.0–100.0)
Monocytes Relative: 8 % (ref 3–12)
Neutro Abs: 5 10*3/uL (ref 1.7–7.7)
Neutrophils Relative %: 69 % (ref 43–77)
Platelets: 239 10*3/uL (ref 150–400)
RBC: 4.26 MIL/uL (ref 3.87–5.11)

## 2013-07-20 LAB — COMPREHENSIVE METABOLIC PANEL
ALT: 48 U/L — ABNORMAL HIGH (ref 0–35)
Albumin: 2.3 g/dL — ABNORMAL LOW (ref 3.5–5.2)
Alkaline Phosphatase: 133 U/L — ABNORMAL HIGH (ref 39–117)
BUN: 9 mg/dL (ref 6–23)
Chloride: 99 mEq/L (ref 96–112)
Glucose, Bld: 119 mg/dL — ABNORMAL HIGH (ref 70–99)
Potassium: 3.6 mEq/L (ref 3.5–5.1)
Sodium: 132 mEq/L — ABNORMAL LOW (ref 135–145)
Total Bilirubin: 0.3 mg/dL (ref 0.3–1.2)

## 2013-07-20 LAB — AMYLASE: Amylase: 47 U/L (ref 0–105)

## 2013-07-20 MED ORDER — GI COCKTAIL ~~LOC~~
30.0000 mL | Freq: Once | ORAL | Status: AC
  Administered 2013-07-20: 30 mL via ORAL
  Filled 2013-07-20: qty 30

## 2013-07-20 NOTE — MAU Provider Note (Signed)
History     CSN: [id number]  Arrival date and time: 07/20/13 2031   None     No chief complaint on file.  HPI  KIMBREE CASANAS is a  27 y.o. G2P1001 at [redacted]w[redacted]d who presents today with epigastric pain. She states that earlier today it was 9/10, and it is currently 4/10. She states that she takes zantac for acid reflux. She denies any contractions, bleeding or LOF. She confirms fetal movement.   Past Medical History  Diagnosis Date  . Diabetes mellitus   . Depression bi-polar  . Obesity     Past Surgical History  Procedure Laterality Date  . No past surgeries    . Mouth surgery      Family History  Problem Relation Age of Onset  . Asthma Mother   . Diabetes Mother   . Alcohol abuse Mother     History  Substance Use Topics  . Smoking status: Current Every Day Smoker -- 0.25 packs/day for 6 years    Types: Cigarettes  . Smokeless tobacco: Never Used     Comment: trying to quitt  . Alcohol Use: No    Allergies: No Known Allergies  Prescriptions prior to admission  Medication Sig Dispense Refill  . acetaminophen (TYLENOL) 500 MG tablet Take 500 mg by mouth every 6 (six) hours as needed for pain (stomach pain).      . hydrOXYzine (ATARAX/VISTARIL) 25 MG tablet Take 1-2 tablets (25-50 mg total) by mouth every 6 (six) hours as needed for itching.  60 tablet  2  . insulin NPH (NOVOLIN N) 100 UNIT/ML injection Inject 44U in the AM before breakfast. Inject 28U at bedtime.  1 vial  12  . insulin regular (HUMULIN R) 100 units/mL injection Inject 22U mixed in the AM before breakfast with your NPH. Inject 22U before dinner.  10 mL  12  . ondansetron (ZOFRAN) 4 MG tablet Take 1 tablet (4 mg total) by mouth every 6 (six) hours.  12 tablet  0  . Prenatal Vit-Fe Fumarate-FA (PRENATAL MULTIVITAMIN) TABS tablet Take 1 tablet by mouth at bedtime.       . promethazine (PHENERGAN) 25 MG tablet Take 1 tablet (25 mg total) by mouth every 6 (six) hours as needed for nausea.  30 tablet  3   . ranitidine (ZANTAC) 150 MG tablet Take 1 tablet (150 mg total) by mouth 2 (two) times daily.  60 tablet  1  . glucose blood (ACCU-CHEK ACTIVE STRIPS) test strip Check Blood sugars four times a day  100 each  12    ROS Physical Exam   Blood pressure 109/79, pulse 104, temperature 97.7 F (36.5 C), temperature source Oral, resp. rate 18, last menstrual period 10/08/2012.  Physical Exam  Nursing note and vitals reviewed. Constitutional: She is oriented to person, place, and time. She appears well-developed and well-nourished. No distress.  Cardiovascular: Normal rate.   Respiratory: Effort normal.  GI: Soft. There is no tenderness.  Genitourinary:  Cervix:   Neurological: She is alert and oriented to person, place, and time.  Skin: Skin is warm and dry.  Psychiatric: She has a normal mood and affect.   FHT: 145. Moderate with 15x15 accels, no decels Toco: no UCs  MAU Course  Procedures  Results for orders placed during the hospital encounter of 07/20/13 (from the past 24 hour(s))  CBC WITH DIFFERENTIAL     Status: None   Collection Time    07/20/13  9:20 PM  Result Value Range   WBC 7.2  4.0 - 10.5 K/uL   RBC 4.26  3.87 - 5.11 MIL/uL   Hemoglobin 12.0  12.0 - 15.0 g/dL   HCT 64.4  03.4 - 74.2 %   MCV 84.5  78.0 - 100.0 fL   MCH 28.2  26.0 - 34.0 pg   MCHC 33.3  30.0 - 36.0 g/dL   RDW 59.5  63.8 - 75.6 %   Platelets 239  150 - 400 K/uL   Neutrophils Relative % 69  43 - 77 %   Neutro Abs 5.0  1.7 - 7.7 K/uL   Lymphocytes Relative 22  12 - 46 %   Lymphs Abs 1.6  0.7 - 4.0 K/uL   Monocytes Relative 8  3 - 12 %   Monocytes Absolute 0.5  0.1 - 1.0 K/uL   Eosinophils Relative 2  0 - 5 %   Eosinophils Absolute 0.1  0.0 - 0.7 K/uL   Basophils Relative 0  0 - 1 %   Basophils Absolute 0.0  0.0 - 0.1 K/uL  COMPREHENSIVE METABOLIC PANEL     Status: Abnormal   Collection Time    07/20/13  9:20 PM      Result Value Range   Sodium 132 (*) 135 - 145 mEq/L   Potassium 3.6   3.5 - 5.1 mEq/L   Chloride 99  96 - 112 mEq/L   CO2 26  19 - 32 mEq/L   Glucose, Bld 119 (*) 70 - 99 mg/dL   BUN 9  6 - 23 mg/dL   Creatinine, Ser 4.33  0.50 - 1.10 mg/dL   Calcium 8.7  8.4 - 29.5 mg/dL   Total Protein 6.3  6.0 - 8.3 g/dL   Albumin 2.3 (*) 3.5 - 5.2 g/dL   AST 23  0 - 37 U/L   ALT 48 (*) 0 - 35 U/L   Alkaline Phosphatase 133 (*) 39 - 117 U/L   Total Bilirubin 0.3  0.3 - 1.2 mg/dL   GFR calc non Af Amer >90  >90 mL/min   GFR calc Af Amer >90  >90 mL/min    Assessment and Plan   1. GERD (gastroesophageal reflux disease)    Slightly increased ALT Amylase and lipase pending FU with the clinic in the morning.   Tawnya Crook 07/20/2013, 9:21 PM

## 2013-07-20 NOTE — MAU Note (Signed)
Here due to upper abdominal pain that she has been experiencing all day. Pain is constant and just recently went away. Denies any bleeding or leakage of fluid. Baby is moving well per pt. Pt. Is a type 2 insulin dependent diabetic. Has biweekly NSTs scheduled Monday and Thursday of each week. Also, due to see MD tomorrow with that visit. Also, pt. Gets weekly ultrasounds to measure her amniotic fluid and just had this done last Thursday. Here to be evaluated due to pain she was rating a 9/10. Now states pain is a 4/10.

## 2013-07-21 ENCOUNTER — Encounter: Payer: Self-pay | Admitting: Family Medicine

## 2013-07-21 ENCOUNTER — Ambulatory Visit (INDEPENDENT_AMBULATORY_CARE_PROVIDER_SITE_OTHER): Payer: Medicaid Other | Admitting: Family Medicine

## 2013-07-21 VITALS — BP 108/70 | Temp 96.5°F | Wt 292.1 lb

## 2013-07-21 DIAGNOSIS — O24913 Unspecified diabetes mellitus in pregnancy, third trimester: Secondary | ICD-10-CM

## 2013-07-21 DIAGNOSIS — O24919 Unspecified diabetes mellitus in pregnancy, unspecified trimester: Secondary | ICD-10-CM

## 2013-07-21 LAB — POCT URINALYSIS DIP (DEVICE)
Hgb urine dipstick: NEGATIVE
Leukocytes, UA: NEGATIVE
Protein, ur: NEGATIVE mg/dL
Specific Gravity, Urine: >=1.03 (ref 1.005–1.030)

## 2013-07-21 NOTE — Progress Notes (Signed)
MFM appointment for an U/S scheduled 08/11/13 at 830 am.

## 2013-07-21 NOTE — Progress Notes (Signed)
Still deciding about delivery method--awaiting growth at 28 wks--ordered today No log book--Reports BS good except last 2 days fastings elevated, but skipping hs snack. Diet reviewed. No NST--seen in MAU and had monitoring there.

## 2013-07-21 NOTE — Progress Notes (Signed)
Pt was seen @ MAU last pm for epigastric pain. NST was reactive, therefore not needed this morning. Next NST/AFI scheduled on 12/18.

## 2013-07-21 NOTE — Progress Notes (Signed)
P= 90 States "my stomach just hurts" and points to lower abdomen/pelvic area.

## 2013-07-21 NOTE — Patient Instructions (Signed)
Gestational Diabetes Mellitus Gestational diabetes mellitus, often simply referred to as gestational diabetes, is a type of diabetes that some women develop during pregnancy. In gestational diabetes, the pancreas does not make enough insulin (a hormone), the cells are less responsive to the insulin that is made (insulin resistance), or both.Normally, insulin moves sugars from food into the tissue cells. The tissue cells use the sugars for energy. The lack of insulin or the lack of normal response to insulin causes excess sugars to build up in the blood instead of going into the tissue cells. As a result, high blood sugar (hyperglycemia) develops. The effect of high sugar (glucose) levels can cause many complications.  RISK FACTORS You have an increased chance of developing gestational diabetes if you have a family history of diabetes and also have one or more of the following risk factors:  A body mass index over 30 (obesity).  A previous pregnancy with gestational diabetes.  An older age at the time of pregnancy. If blood glucose levels are kept in the normal range during pregnancy, women can have a healthy pregnancy. If your blood glucose levels are not well controlled, there may be risks to you, your unborn baby (fetus), your labor and delivery, or your newborn baby.  SYMPTOMS  If symptoms are experienced, they are much like symptoms you would normally expect during pregnancy. The symptoms of gestational diabetes include:   Increased thirst (polydipsia).  Increased urination (polyuria).  Increased urination during the night (nocturia).  Weight loss. This weight loss may be rapid.  Frequent, recurring infections.  Tiredness (fatigue).  Weakness.  Vision changes, such as blurred vision.  Fruity smell to your breath.  Abdominal pain. DIAGNOSIS Diabetes is diagnosed when blood glucose levels are increased. Your blood glucose level may be checked by one or more of the following  blood tests:  A fasting blood glucose test. You will not be allowed to eat for at least 8 hours before a blood sample is taken.  A random blood glucose test. Your blood glucose is checked at any time of the day regardless of when you ate.  A hemoglobin A1c blood glucose test. A hemoglobin A1c test provides information about blood glucose control over the previous 3 months.  An oral glucose tolerance test (OGTT). Your blood glucose is measured after you have not eaten (fasted) for 1 3 hours and then after you drink a glucose-containing beverage. Since the hormones that cause insulin resistance are highest at about 24 28 weeks of a pregnancy, an OGTT is usually performed during that time. If you have risk factors for gestational diabetes, your caregiver may test you for gestational diabetes earlier than 24 weeks of pregnancy. TREATMENT   You will need to take diabetes medicine or insulin daily to keep blood glucose levels in the desired range.  You will need to match insulin dosing with exercise and healthy food choices. The treatment goal is to maintain the before meal (preprandial), bedtime, and overnight blood glucose level at 60 99 mg/dL during pregnancy. The treatment goal is to further maintain peak after meal blood sugar (postprandial glucose) level at 100 140 mg/dL. HOME CARE INSTRUCTIONS   Have your hemoglobin A1c level checked twice a year.  Perform daily blood glucose monitoring as directed by your caregiver. It is common to perform frequent blood glucose monitoring.  Monitor urine ketones when you are ill and as directed by your caregiver.  Take your diabetes medicine and insulin as directed by your caregiver to maintain   your blood glucose level in the desired range.  Never run out of diabetes medicine or insulin. It is needed every day.  Adjust insulin based on your intake of carbohydrates. Carbohydrates can raise blood glucose levels but need to be included in your diet.  Carbohydrates provide vitamins, minerals, and fiber which are an essential part of a healthy diet. Carbohydrates are found in fruits, vegetables, whole grains, dairy products, legumes, and foods containing added sugars.    Eat healthy foods. Alternate 3 meals with 3 snacks.  Maintain a healthy weight gain. The usual total expected weight gain varies according to your prepregnancy body mass index (BMI).  Carry a medical alert card or wear your medical alert jewelry.  Carry a 15 gram carbohydrate snack with you at all times to treat low blood glucose (hypoglycemia). Some examples of 15 gram carbohydrate snacks include:  Glucose tablets, 3 or 4   Glucose gel, 15 gram tube  Raisins, 2 tablespoons (24 g)  Jelly beans, 6  Animal crackers, 8  Fruit juice, regular soda, or low fat milk, 4 ounces (120 mL)  Gummy treats, 9    Recognize hypoglycemia. Hypoglycemia during pregnancy occurs with blood glucose levels of 60 mg/dL and below. The risk for hypoglycemia increases when fasting or skipping meals, during or after intense exercise, and during sleep. Hypoglycemia symptoms can include:  Tremors or shakes.  Decreased ability to concentrate.  Sweating.  Increased heart rate.  Headache.  Dry mouth.  Hunger.  Irritability.  Anxiety.  Restless sleep.  Altered speech or coordination.  Confusion.  Treat hypoglycemia promptly. If you are alert and able to safely swallow, follow the 15:15 rule:  Take 15 20 grams of rapid-acting glucose or carbohydrate. Rapid-acting options include glucose gel, glucose tablets, or 4 ounces (120 mL) of fruit juice, regular soda, or low fat milk.  Check your blood glucose level 15 minutes after taking the glucose.   Take 15 20 grams more of glucose if the repeat blood glucose level is still 70 mg/dL or below.  Eat a meal or snack within 1 hour once blood glucose levels return to normal.  Be alert to polyuria and polydipsia which are early  signs of hyperglycemia. An early awareness of hyperglycemia allows for prompt treatment. Treat hyperglycemia as directed by your caregiver.  Engage in at least 30 minutes of physical activity a day or as directed by your caregiver. Ten minutes of physical activity timed 30 minutes after each meal is encouraged to control postprandial blood glucose levels.  Adjust your insulin dosing and food intake as needed if you start a new exercise or sport.  Follow your sick day plan at any time you are unable to eat or drink as usual.  Avoid tobacco and alcohol use.  Follow up with your caregiver regularly.  Follow the advice of your caregiver regarding your prenatal and post-delivery (postpartum) appointments, meal planning, exercise, medicines, vitamins, blood tests, other medical tests, and physical activities.  Perform daily skin and foot care. Examine your skin and feet daily for cuts, bruises, redness, nail problems, bleeding, blisters, or sores.  Brush your teeth and gums at least twice a day and floss at least once a day. Follow up with your dentist regularly.  Schedule an eye exam during the first trimester of your pregnancy or as directed by your caregiver.  Share your diabetes management plan with your workplace or school.  Stay up-to-date with immunizations.  Learn to manage stress.  Obtain ongoing diabetes education and   support as needed. SEEK MEDICAL CARE IF:   You are unable to eat food or drink fluids for more than 6 hours.  You have nausea and vomiting for more than 6 hours.  You have a blood glucose level of 200 mg/dL and you have ketones in your urine.  There is a change in mental status.  You develop vision problems.  You have a persistent headache.  You have upper abdominal pain or discomfort.  You develop an additional serious illness.  You have diarrhea for more than 6 hours.  You have been sick or have had a fever for a couple of days and are not getting  better. SEEK IMMEDIATE MEDICAL CARE IF:   You have difficulty breathing.  You no longer feel the baby moving.  You are bleeding or have discharge from your vagina.  You start having premature contractions or labor. MAKE SURE YOU:  Understand these instructions.  Will watch your condition.  Will get help right away if you are not doing well or get worse. Document Released: 10/30/2000 Document Revised: 11/18/2012 Document Reviewed: 02/20/2012 [employer] Patient Information 2014 [employer].  Breastfeeding Deciding to breastfeed is one of the best choices you can make for you and your baby. A change in hormones during pregnancy causes your breast tissue to grow and increases the number and size of your milk ducts. These hormones also allow proteins, sugars, and fats from your blood supply to make breast milk in your milk-producing glands. Hormones prevent breast milk from being released before your baby is born as well as prompt milk flow after birth. Once breastfeeding has begun, thoughts of your baby, as well as his or her sucking or crying, can stimulate the release of milk from your milk-producing glands.  BENEFITS OF BREASTFEEDING For Your Baby  Your first milk (colostrum) helps your baby's digestive system function better.   There are antibodies in your milk that help your baby fight off infections.   Your baby has a lower incidence of asthma, allergies, and sudden infant death syndrome.   The nutrients in breast milk are better for your baby than infant formulas and are designed uniquely for your baby's needs.   Breast milk improves your baby's brain development.   Your baby is less likely to develop other conditions, such as childhood obesity, asthma, or type 2 diabetes mellitus.  For You   Breastfeeding helps to create a very special bond between you and your baby.   Breastfeeding is convenient. Breast milk is always available at the correct temperature and  costs nothing.   Breastfeeding helps to burn calories and helps you lose the weight gained during pregnancy.   Breastfeeding makes your uterus contract to its prepregnancy size faster and slows bleeding (lochia) after you give birth.   Breastfeeding helps to lower your risk of developing type 2 diabetes mellitus, osteoporosis, and breast or ovarian cancer later in life. SIGNS THAT YOUR BABY IS HUNGRY Early Signs of Hunger  Increased alertness or activity.  Stretching.  Movement of the head from side to side.  Movement of the head and opening of the mouth when the corner of the mouth or cheek is stroked (rooting).  Increased sucking sounds, smacking lips, cooing, sighing, or squeaking.  Hand-to-mouth movements.  Increased sucking of fingers or hands. Late Signs of Hunger  Fussing.  Intermittent crying. Extreme Signs of Hunger Signs of extreme hunger will require calming and consoling before your baby will be able to breastfeed successfully. Do not   wait for the following signs of extreme hunger to occur before you initiate breastfeeding:   Restlessness.  A loud, strong cry.   Screaming. BREASTFEEDING BASICS Breastfeeding Initiation  Find a comfortable place to sit or lie down, with your neck and back well supported.  Place a pillow or rolled up blanket under your baby to bring him or her to the level of your breast (if you are seated). Nursing pillows are specially designed to help support your arms and your baby while you breastfeed.  Make sure that your baby's abdomen is facing your abdomen.   Gently massage your breast. With your fingertips, massage from your chest wall toward your nipple in a circular motion. This encourages milk flow. You may need to continue this action during the feeding if your milk flows slowly.  Support your breast with 4 fingers underneath and your thumb above your nipple. Make sure your fingers are well away from your nipple and your  baby's mouth.   Stroke your baby's lips gently with your finger or nipple.   When your baby's mouth is open wide enough, quickly bring your baby to your breast, placing your entire nipple and as much of the colored area around your nipple (areola) as possible into your baby's mouth.   More areola should be visible above your baby's upper lip than below the lower lip.   Your baby's tongue should be between his or her lower gum and your breast.   Ensure that your baby's mouth is correctly positioned around your nipple (latched). Your baby's lips should create a seal on your breast and be turned out (everted).  It is common for your baby to suck about 2 3 minutes in order to start the flow of breast milk. Latching Teaching your baby how to latch on to your breast properly is very important. An improper latch can cause nipple pain and decreased milk supply for you and poor weight gain in your baby. Also, if your baby is not latched onto your nipple properly, he or she may swallow some air during feeding. This can make your baby fussy. Burping your baby when you switch breasts during the feeding can help to get rid of the air. However, teaching your baby to latch on properly is still the best way to prevent fussiness from swallowing air while breastfeeding. Signs that your baby has successfully latched on to your nipple:    Silent tugging or silent sucking, without causing you pain.   Swallowing heard between every 3 4 sucks.    Muscle movement above and in front of his or her ears while sucking.  Signs that your baby has not successfully latched on to nipple:   Sucking sounds or smacking sounds from your baby while breastfeeding.  Nipple pain. If you think your baby has not latched on correctly, slip your finger into the corner of your baby's mouth to break the suction and place it between your baby's gums. Attempt breastfeeding initiation again. Signs of Successful  Breastfeeding Signs from your baby:   A gradual decrease in the number of sucks or complete cessation of sucking.   Falling asleep.   Relaxation of his or her body.   Retention of a small amount of milk in his or her mouth.   Letting go of your breast by himself or herself. Signs from you:  Breasts that have increased in firmness, weight, and size 1 3 hours after feeding.   Breasts that are softer immediately after breastfeeding.    Increased milk volume, as well as a change in milk consistency and color by the 5th day of breastfeeding.   Nipples that are not sore, cracked, or bleeding. Signs That Your Baby is Getting Enough Milk  Wetting at least 3 diapers in a 24-hour period. The urine should be clear and pale yellow by age 5 days.  At least 3 stools in a 24-hour period by age 5 days. The stool should be soft and yellow.  At least 3 stools in a 24-hour period by age 7 days. The stool should be seedy and yellow.  No loss of weight greater than 10% of birth weight during the first 3 days of age.  Average weight gain of 4 7 ounces (120 210 mL) per week after age 4 days.  Consistent daily weight gain by age 5 days, without weight loss after the age of 2 weeks. After a feeding, your baby may spit up a small amount. This is common. BREASTFEEDING FREQUENCY AND DURATION Frequent feeding will help you make more milk and can prevent sore nipples and breast engorgement. Breastfeed when you feel the need to reduce the fullness of your breasts or when your baby shows signs of hunger. This is called "breastfeeding on demand." Avoid introducing a pacifier to your baby while you are working to establish breastfeeding (the first 4 6 weeks after your baby is born). After this time you may choose to use a pacifier. Research has shown that pacifier use during the first year of a baby's life decreases the risk of sudden infant death syndrome (SIDS). Allow your baby to feed on each breast as  long as he or she wants. Breastfeed until your baby is finished feeding. When your baby unlatches or falls asleep while feeding from the first breast, offer the second breast. Because newborns are often sleepy in the first few weeks of life, you may need to awaken your baby to get him or her to feed. Breastfeeding times will vary from baby to baby. However, the following rules can serve as a guide to help you ensure that your baby is properly fed:  Newborns (babies 4 weeks of age or younger) may breastfeed every 1 3 hours.  Newborns should not go longer than 3 hours during the day or 5 hours during the night without breastfeeding.  You should breastfeed your baby a minimum of 8 times in a 24-hour period until you begin to introduce solid foods to your baby at around 6 months of age. BREAST MILK PUMPING Pumping and storing breast milk allows you to ensure that your baby is exclusively fed your breast milk, even at times when you are unable to breastfeed. This is especially important if you are going back to work while you are still breastfeeding or when you are not able to be present during feedings. Your lactation consultant can give you guidelines on how long it is safe to store breast milk.  A breast pump is a machine that allows you to pump milk from your breast into a sterile bottle. The pumped breast milk can then be stored in a refrigerator or freezer. Some breast pumps are operated by hand, while others use electricity. Ask your lactation consultant which type will work best for you. Breast pumps can be purchased, but some hospitals and breastfeeding support groups lease breast pumps on a monthly basis. A lactation consultant can teach you how to hand express breast milk, if you prefer not to use a pump.  CARING FOR   YOUR BREASTS WHILE YOU BREASTFEED Nipples can become dry, cracked, and sore while breastfeeding. The following recommendations can help keep your breasts moisturized and  healthy:  Avoid using soap on your nipples.   Wear a supportive bra. Although not required, special nursing bras and tank tops are designed to allow access to your breasts for breastfeeding without taking off your entire bra or top. Avoid wearing underwire style bras or extremely tight bras.  Air dry your nipples for 3 4minutes after each feeding.   Use only cotton bra pads to absorb leaked breast milk. Leaking of breast milk between feedings is normal.   Use lanolin on your nipples after breastfeeding. Lanolin helps to maintain your skin's normal moisture barrier. If you use pure lanolin you do not need to wash it off before feeding your baby again. Pure lanolin is not toxic to your baby. You may also hand express a few drops of breast milk and gently massage that milk into your nipples and allow the milk to air dry. In the first few weeks after giving birth, some women experience extremely full breasts (engorgement). Engorgement can make your breasts feel heavy, warm, and tender to the touch. Engorgement peaks within 3 5 days after you give birth. The following recommendations can help ease engorgement:  Completely empty your breasts while breastfeeding or pumping. You may want to start by applying warm, moist heat (in the shower or with warm water-soaked hand towels) just before feeding or pumping. This increases circulation and helps the milk flow. If your baby does not completely empty your breasts while breastfeeding, pump any extra milk after he or she is finished.  Wear a snug bra (nursing or regular) or tank top for 1 2 days to signal your body to slightly decrease milk production.  Apply ice packs to your breasts, unless this is too uncomfortable for you.  Make sure that your baby is latched on and positioned properly while breastfeeding. If engorgement persists after 48 hours of following these recommendations, contact your health care provider or a lactation consultant. OVERALL  HEALTH CARE RECOMMENDATIONS WHILE BREASTFEEDING  Eat healthy foods. Alternate between meals and snacks, eating 3 of each per day. Because what you eat affects your breast milk, some of the foods may make your baby more irritable than usual. Avoid eating these foods if you are sure that they are negatively affecting your baby.  Drink milk, fruit juice, and water to satisfy your thirst (about 10 glasses a day).   Rest often, relax, and continue to take your prenatal vitamins to prevent fatigue, stress, and anemia.  Continue breast self-awareness checks.  Avoid chewing and smoking tobacco.  Avoid alcohol and drug use. Some medicines that may be harmful to your baby can pass through breast milk. It is important to ask your health care provider before taking any medicine, including all over-the-counter and prescription medicine as well as vitamin and herbal supplements. It is possible to become pregnant while breastfeeding. If birth control is desired, ask your health care provider about options that will be safe for your baby. SEEK MEDICAL CARE IF:   You feel like you want to stop breastfeeding or have become frustrated with breastfeeding.  You have painful breasts or nipples.  Your nipples are cracked or bleeding.  Your breasts are red, tender, or warm.  You have a swollen area on either breast.  You have a fever or chills.  You have nausea or vomiting.  You have drainage other than breast   milk from your nipples.  Your breasts do not become full before feedings by the 5th day after you give birth.  You feel sad and depressed.  Your baby is too sleepy to eat well.  Your baby is having trouble sleeping.   Your baby is wetting less than 3 diapers in a 24-hour period.  Your baby has less than 3 stools in a 24-hour period.  Your baby's skin or the white part of his or her eyes becomes yellow.   Your baby is not gaining weight by 5 days of age. SEEK IMMEDIATE MEDICAL CARE  IF:   Your baby is overly tired (lethargic) and does not want to wake up and feed.  Your baby develops an unexplained fever. Document Released: 07/24/2005 Document Revised: 03/26/2013 Document Reviewed: 01/15/2013 [employer] Patient Information 2014 [employer].  

## 2013-07-24 ENCOUNTER — Ambulatory Visit (INDEPENDENT_AMBULATORY_CARE_PROVIDER_SITE_OTHER): Payer: Medicaid Other | Admitting: *Deleted

## 2013-07-24 ENCOUNTER — Encounter: Payer: Self-pay | Admitting: Family Medicine

## 2013-07-24 DIAGNOSIS — O24919 Unspecified diabetes mellitus in pregnancy, unspecified trimester: Secondary | ICD-10-CM

## 2013-07-24 DIAGNOSIS — O24913 Unspecified diabetes mellitus in pregnancy, third trimester: Secondary | ICD-10-CM

## 2013-07-24 LAB — CULTURE, BETA STREP (GROUP B ONLY)

## 2013-07-24 NOTE — Progress Notes (Signed)
NST performed today was reviewed and was found to be reactive.  AFI normal at 14.3 cm. Continue recommended antenatal testing and prenatal care.  

## 2013-07-24 NOTE — [facility] Provider Note (Signed)
Attestation of Attending Supervision of Advanced Practitioner: Evaluation and management procedures were performed by the PA/NP/CNM/OB Fellow under my supervision/collaboration. Chart reviewed and agree with management and plan.  Kenyon Eichelberger V 07/24/2013 7:46 AM

## 2013-07-28 ENCOUNTER — Ambulatory Visit (INDEPENDENT_AMBULATORY_CARE_PROVIDER_SITE_OTHER): Payer: Medicaid Other | Admitting: Obstetrics & Gynecology

## 2013-07-28 VITALS — BP 124/76 | Wt 298.2 lb

## 2013-07-28 DIAGNOSIS — Z23 Encounter for immunization: Secondary | ICD-10-CM

## 2013-07-28 DIAGNOSIS — O24919 Unspecified diabetes mellitus in pregnancy, unspecified trimester: Secondary | ICD-10-CM

## 2013-07-28 DIAGNOSIS — E669 Obesity, unspecified: Secondary | ICD-10-CM

## 2013-07-28 DIAGNOSIS — E119 Type 2 diabetes mellitus without complications: Secondary | ICD-10-CM

## 2013-07-28 DIAGNOSIS — O24913 Unspecified diabetes mellitus in pregnancy, third trimester: Secondary | ICD-10-CM

## 2013-07-28 DIAGNOSIS — O0990 Supervision of high risk pregnancy, unspecified, unspecified trimester: Secondary | ICD-10-CM

## 2013-07-28 LAB — POCT URINALYSIS DIP (DEVICE)
Bilirubin Urine: NEGATIVE
Glucose, UA: NEGATIVE mg/dL
Hgb urine dipstick: NEGATIVE
Ketones, ur: NEGATIVE mg/dL
Specific Gravity, Urine: 1.025 (ref 1.005–1.030)
Urobilinogen, UA: 0.2 mg/dL (ref 0.0–1.0)
pH: 6.5 (ref 5.0–8.0)

## 2013-07-28 MED ORDER — TETANUS-DIPHTH-ACELL PERTUSSIS 5-2.5-18.5 LF-MCG/0.5 IM SUSP: 0.5000 mL | Freq: Once | INTRAMUSCULAR | Status: DC

## 2013-07-28 NOTE — Progress Notes (Signed)
GBS, GC and Chlam negative from last week.  Blood sugars reviewed, mostly within range, no changes made to insulin regimen.  NST performed today was reviewed and was found to be reactive.  Tdap given today.  Growth scan on 08/11/12, patient to decide on cesarean vs vaginal delivery depending on EFW, and given history of moderate shoulder dystocia.  Continue recommended antenatal testing and prenatal care.   No other complaints or concerns.  Fetal movement and labor precautions reviewed.

## 2013-07-28 NOTE — Patient Instructions (Signed)
Return to clinic for any obstetric concerns or go to MAU for evaluation  

## 2013-07-28 NOTE — Progress Notes (Signed)
P = 105   [place] for growth @ [facility] on 08/11/13

## 2013-07-30 ENCOUNTER — Other Ambulatory Visit: Payer: Medicaid Other

## 2013-08-02 NOTE — Progress Notes (Signed)
NST reactive on 07/17/13

## 2013-08-04 ENCOUNTER — Encounter: Payer: Self-pay | Admitting: Obstetrics and Gynecology

## 2013-08-04 ENCOUNTER — Ambulatory Visit (INDEPENDENT_AMBULATORY_CARE_PROVIDER_SITE_OTHER): Payer: Medicaid Other | Admitting: Obstetrics and Gynecology

## 2013-08-04 VITALS — BP 119/59 | Temp 97.9°F | Wt 292.3 lb

## 2013-08-04 DIAGNOSIS — E119 Type 2 diabetes mellitus without complications: Secondary | ICD-10-CM

## 2013-08-04 DIAGNOSIS — O24913 Unspecified diabetes mellitus in pregnancy, third trimester: Secondary | ICD-10-CM

## 2013-08-04 DIAGNOSIS — O24919 Unspecified diabetes mellitus in pregnancy, unspecified trimester: Secondary | ICD-10-CM

## 2013-08-04 DIAGNOSIS — O0990 Supervision of high risk pregnancy, unspecified, unspecified trimester: Secondary | ICD-10-CM

## 2013-08-04 LAB — POCT URINALYSIS DIP (DEVICE)
Bilirubin Urine: NEGATIVE
Glucose, UA: NEGATIVE mg/dL
Hgb urine dipstick: NEGATIVE
Ketones, ur: NEGATIVE mg/dL
Specific Gravity, Urine: 1.025 (ref 1.005–1.030)
pH: 7 (ref 5.0–8.0)

## 2013-08-04 NOTE — Progress Notes (Signed)
P=  Edema in feet. Pt. States "sometimes i have tingling in my hands and feet. It comes and goes."

## 2013-08-04 NOTE — Progress Notes (Signed)
Pulse- 94 

## 2013-08-04 NOTE — Progress Notes (Signed)
NST reviewed and reactive Patient doing well without complaints. FM/labor precautions reviewed Patient did not bring CBG log but reports highest fasting 93 and highest postprandial of 153 on Christmas day Growth ultrasound scheduled for 1/5. Patient to decide on c-section or vaginal birth pending EFW given history of shoulder dystocia

## 2013-08-06 ENCOUNTER — Other Ambulatory Visit: Payer: Medicaid Other

## 2013-08-06 ENCOUNTER — Ambulatory Visit (INDEPENDENT_AMBULATORY_CARE_PROVIDER_SITE_OTHER): Payer: Medicaid Other | Admitting: *Deleted

## 2013-08-06 ENCOUNTER — Ambulatory Visit (HOSPITAL_COMMUNITY)
Admission: RE | Admit: 2013-08-06 | Discharge: 2013-08-06 | Disposition: A | Discharge status: Home / Self Care | Payer: Medicaid Other | Source: Ambulatory Visit | Attending: Obstetrics & Gynecology | Admitting: Obstetrics & Gynecology

## 2013-08-06 VITALS — BP 112/57 | Temp 97.7°F | Wt 295.3 lb

## 2013-08-06 DIAGNOSIS — O24919 Unspecified diabetes mellitus in pregnancy, unspecified trimester: Secondary | ICD-10-CM

## 2013-08-06 DIAGNOSIS — O24913 Unspecified diabetes mellitus in pregnancy, third trimester: Secondary | ICD-10-CM

## 2013-08-06 DIAGNOSIS — O99330 Smoking (tobacco) complicating pregnancy, unspecified trimester: Secondary | ICD-10-CM | POA: Insufficient documentation

## 2013-08-06 DIAGNOSIS — E669 Obesity, unspecified: Secondary | ICD-10-CM | POA: Insufficient documentation

## 2013-08-06 NOTE — Progress Notes (Signed)
P=110  Pt here for NST,  AFI scheduled today in Ultrasound

## 2013-08-06 NOTE — Progress Notes (Signed)
NST on 08/06/13 is reactive

## 2013-08-11 ENCOUNTER — Ambulatory Visit (INDEPENDENT_AMBULATORY_CARE_PROVIDER_SITE_OTHER): Payer: Medicaid Other | Admitting: Obstetrics & Gynecology

## 2013-08-11 ENCOUNTER — Ambulatory Visit (HOSPITAL_COMMUNITY)
Admission: RE | Admit: 2013-08-11 | Discharge: 2013-08-11 | Disposition: A | Discharge status: Home / Self Care | Payer: Medicaid Other | Source: Ambulatory Visit | Attending: Family Medicine | Admitting: Family Medicine

## 2013-08-11 ENCOUNTER — Encounter: Payer: Self-pay | Admitting: Obstetrics & Gynecology

## 2013-08-11 VITALS — BP 121/71 | Wt 300.4 lb

## 2013-08-11 DIAGNOSIS — O99330 Smoking (tobacco) complicating pregnancy, unspecified trimester: Secondary | ICD-10-CM | POA: Insufficient documentation

## 2013-08-11 DIAGNOSIS — O24913 Unspecified diabetes mellitus in pregnancy, third trimester: Secondary | ICD-10-CM

## 2013-08-11 DIAGNOSIS — O24919 Unspecified diabetes mellitus in pregnancy, unspecified trimester: Secondary | ICD-10-CM | POA: Insufficient documentation

## 2013-08-11 DIAGNOSIS — O3663X0 Maternal care for excessive fetal growth, third trimester, not applicable or unspecified: Secondary | ICD-10-CM | POA: Insufficient documentation

## 2013-08-11 DIAGNOSIS — O99210 Obesity complicating pregnancy, unspecified trimester: Secondary | ICD-10-CM

## 2013-08-11 DIAGNOSIS — E669 Obesity, unspecified: Secondary | ICD-10-CM | POA: Insufficient documentation

## 2013-08-11 LAB — FETAL NONSTRESS TEST

## 2013-08-11 LAB — POCT URINALYSIS DIP (DEVICE)
Bilirubin Urine: NEGATIVE
GLUCOSE, UA: 250 mg/dL — AB
HGB URINE DIPSTICK: NEGATIVE
Nitrite: NEGATIVE
Protein, ur: NEGATIVE mg/dL
Specific Gravity, Urine: >=1.03 (ref 1.005–1.030)
UROBILINOGEN UA: 1 mg/dL (ref 0.0–1.0)
pH: 6 (ref 5.0–8.0)

## 2013-08-11 NOTE — H&P (Signed)
Krystal [name] is an 28 y.o. G2P1001 7361w0d female   Chief Complaint: IUP @ 39 wks, LGA with history of moderate shoulder dystocia  HPI: Multiparous female with moderate shoulder dystocia with 8 lb 13 oz fetus.  [facility].  Advised for primary C-section.  Past Medical History  Diagnosis Date  . Diabetes mellitus   . Depression bi-polar  . Obesity     Past Surgical History  Procedure Laterality Date  . No past surgeries    . Mouth surgery      Family History  Problem Relation Age of Onset  . Asthma Mother   . Diabetes Mother   . Alcohol abuse Mother    Social History:  reports that she has been smoking Cigarettes.  She has a 1.5 pack-year smoking history. She has never used smokeless tobacco. She reports that she does not drink alcohol or use illicit drugs.  Allergies: No Known Allergies  No prescriptions prior to admission     Pertinent items are noted in HPI.  Last menstrual period 10/08/2012. LMP 10/08/2012 General appearance: alert, cooperative and appears stated age Head: Normocephalic, without obvious abnormality, atraumatic Neck: supple, symmetrical, trachea midline Lungs: normal effort Heart: regular rate and rhythm Abdomen: normal findings: soft, non-tender and gravid Extremities: extremities normal, atraumatic, no cyanosis or edema Skin: Skin color, texture, turgor normal. No rashes or lesions Neurologic: Grossly normal   Lab Results  Component Value Date   WBC 7.2 07/20/2013   HGB 12.0 07/20/2013   HCT 36.0 07/20/2013   MCV 84.5 07/20/2013   PLT 239 07/20/2013   Lab Results  Component Value Date   PREGTESTUR Positive 12/27/2012     Assessment/Plan Patient Active Problem List   Diagnosis Date Noted  . Supervision of high-risk pregnancy 01/20/2013    Priority: High  . H/O shoulder dystocia in prior pregnancy, currently pregnant 01/20/2013    Priority: Medium  . Diabetes mellitus, antepartum  12/27/2012    Priority: Medium  . DIABETES MELLITUS II, UNCOMPLICATED 10/04/2006    Priority: Medium  . TOBACCO DEPENDENCE 10/04/2006    Priority: Medium  . Macrosomia affecting management of mother in third trimester 08/11/2013  . PUPP (pruritic urticarial papules and plaques of pregnancy) 04/14/2013  . GERD (gastroesophageal reflux disease) 08/30/2012  . HIDRADENITIS SUPPURATIVA 03/17/2009  . ECZEMA, ATOPIC 08/26/2007  . DEPRESSION, MILD 05/08/2007  . OBESITY, NOS 10/04/2006  . MIGRAINE, UNSPEC., W/O INTRACTABLE MIGRAINE 10/04/2006   For Primary C-section. Risks include but are not limited to bleeding, infection, injury to surrounding structures, including bowel, bladder and ureters, blood clots, and death.  Likelihood of success is high.  Serenidy Waltz S 08/11/2013, 3:11 PM

## 2013-08-11 NOTE — Patient Instructions (Signed)
Cesarean Delivery  Cesarean delivery is the birth of a baby through a cut (incision) in the abdomen and womb (uterus).  LET YOUR CAREGIVER KNOW ABOUT:  Complicationsinvolving the pregnancy.  Allergies.  Medicines taken including herbs, eyedrops, over-the-counter medicines, and creams.  Use of steroids (by mouth or creams).  Previous problems with anesthetics or numbing medicine.  Previous surgery.  History of blood clots.  History of bleeding or blood problems.  Other health problems. RISKS AND COMPLICATIONS   Bleeding.  Infection.  Blood clots.  Injury to surrounding organs.  Anesthesia problems.  Injury to the baby. BEFORE THE PROCEDURE   A tube (Foley catheter) will be placed in your bladder. The Foley catheter drains the urine from your bladder into a bag. This keeps your bladder empty during surgery.  An intravenous access tube (IV) will be placed in your arm.  Hair may be removed from your pubic area and your lower abdomen. This is to prevent infection in the incision site.  You may be given an antacid medicine to drink. This will prevent acid contents in your stomach from going into your lungs if you vomit during the surgery.  You may be given an antibiotic medicine to prevent infection. PROCEDURE   You may be given medicine to numb the lower half of your body (regional anesthetic). If you were in labor, you may have already had an epidural in place which can be used in both labor and cesarean delivery. You may possibly be given medicine to make you sleep (general anesthetic) though this is not as common.  An incision will be made in your abdomen that extends to your uterus. There are 2 basic kinds of incisions:  The horizontal (transverse) incision. Horizontal incisions are used for most routine cesarean deliveries.  The vertical (up and down) incision. This is less commonly used. This is most often reserved for women who have a serious complication  (extreme prematurity) or under emergency situations.  The horizontal and vertical incisions may both be used at the same time. However, this is very uncommon.  Your baby will then be delivered. AFTER THE PROCEDURE   If you were awake during the surgery, you will see your baby right away. If you were asleep, you will see your baby as soon as you are awake.  You may breastfeed your baby after surgery.  You may be able to get up and walk the same day as the surgery. If you need to stay in bed for a period of time, you will receive help to turn, cough, and take deep breaths after surgery. This helps prevent lung problems such as pneumonia.  Do not get out of bed alone the first time after surgery. You will need help getting out of bed until you are able to do this by yourself.  You may be able to shower the day after your cesarean delivery. After the bandage (dressing) is taken off the incision site, a nurse will assist you to shower, if you like.  You will have pneumatic compressing hose placed on your feet or lower legs. These hose are used to prevent blood clots. When you are up and walking regularly, they will no longer be necessary.  Do not cross your legs when you sit.  Save any blood clots that you pass. If you pass a clot while on the toilet, do not flush it. Call for the nurse. Tell the nurse if you think you are bleeding too much or passing too many   clots.  Start drinking liquids and eating food as directed by your caregiver. If your stomach is not ready, drinking and eating too soon can cause an increase in bloating and swelling of your intestine and abdomen. This is very uncomfortable.  You will be given medicine as needed. Let your caregivers know if you are hurting. They want you to be comfortable. You may also be given an antibiotic to prevent an infection.  Your IV will be taken out when you are drinking a reasonable amount of fluids. The Foley catheter is taken out when  you are up and walking.  If your blood type is Rh negative and your baby's blood type is Rh positive, you will be given a shot of anti-D immune globulin. This shot prevents you from having Rh problems with a future pregnancy. You should get the shot even if you had your tubes tied (tubal ligation).  If you are allowed to take the baby for a walk, place the baby in the bassinet and push it. Do not carry your baby in your arms. Document Released: 07/24/2005 Document Revised: 10/16/2011 Document Reviewed: 02/12/2013 [employer] Patient Information 2014 [employer].  

## 2013-08-11 NOTE — Progress Notes (Signed)
EFW today 4070g (9 lbs)/>90%, AC >97%, HC >21%.  NST performed today was reviewed and was found to be reactive.  Continue recommended antenatal testing and prenatal care. Patient forgot log book at home, reports normal CBGs with normal fastings and postprandials. She will bring log book on Thursday. Recommended LTCS given history of moderate shoulder dystocia with last pregnancy.  Patient was told that there is a margin of error of about 500 g with EFW, patient understands this   After reviewing risks of shoulder dystocia vs cesarean section, patient opted for cesarean section.  Scheduled 08/18/13 at 12 noon with Dr. Shawnie PonsPratt, this case was also discussed with Dr. Shawnie PonsPratt who was here in clinic and she concurred with the plan. No other complaints or concerns.  Routine obstetric precautions reviewed.

## 2013-08-11 NOTE — Progress Notes (Signed)
P = 97   US for growth done today 

## 2013-08-12 ENCOUNTER — Encounter (HOSPITAL_COMMUNITY): Payer: Self-pay | Admitting: Pharmacist

## 2013-08-14 ENCOUNTER — Ambulatory Visit (INDEPENDENT_AMBULATORY_CARE_PROVIDER_SITE_OTHER): Payer: Medicaid Other | Admitting: General Practice

## 2013-08-14 VITALS — BP 121/62

## 2013-08-14 DIAGNOSIS — O24919 Unspecified diabetes mellitus in pregnancy, unspecified trimester: Secondary | ICD-10-CM

## 2013-08-14 DIAGNOSIS — O24913 Unspecified diabetes mellitus in pregnancy, third trimester: Secondary | ICD-10-CM

## 2013-08-14 NOTE — Progress Notes (Signed)
NST reviewed and reactive.  Veyda Kaufman L. Harraway-Smith, M.D., FACOG    

## 2013-08-14 NOTE — Progress Notes (Signed)
P = 119   C/S scheduled on 08/18/13

## 2013-08-15 ENCOUNTER — Encounter (HOSPITAL_COMMUNITY): Payer: Self-pay

## 2013-08-15 ENCOUNTER — Encounter (HOSPITAL_COMMUNITY)
Admission: RE | Admit: 2013-08-15 | Discharge: 2013-08-15 | Disposition: A | Discharge status: Home / Self Care | Payer: Medicaid Other | Source: Ambulatory Visit | Attending: Family Medicine | Admitting: Family Medicine

## 2013-08-15 LAB — TYPE AND SCREEN
ABO/RH(D): A POS
ANTIBODY SCREEN: NEGATIVE

## 2013-08-15 LAB — CBC
HCT: 36.5 % (ref 36.0–46.0)
Hemoglobin: 12.2 g/dL (ref 12.0–15.0)
MCH: 28.5 pg (ref 26.0–34.0)
MCHC: 33.4 g/dL (ref 30.0–36.0)
MCV: 85.3 fL (ref 78.0–100.0)
PLATELETS: 221 10*3/uL (ref 150–400)
RBC: 4.28 MIL/uL (ref 3.87–5.11)
RDW: 14.1 % (ref 11.5–15.5)
WBC: 9.8 10*3/uL (ref 4.0–10.5)

## 2013-08-15 LAB — BASIC METABOLIC PANEL
BUN: 10 mg/dL (ref 6–23)
CALCIUM: 9 mg/dL (ref 8.4–10.5)
CHLORIDE: 102 meq/L (ref 96–112)
CO2: 26 mEq/L (ref 19–32)
CREATININE: 0.55 mg/dL (ref 0.50–1.10)
GFR calc Af Amer: >90 mL/min (ref >90)
Glucose, Bld: 82 mg/dL (ref 70–99)
Potassium: 4 mEq/L (ref 3.7–5.3)
Sodium: 138 mEq/L (ref 137–147)

## 2013-08-15 LAB — RPR: RPR: NONREACTIVE

## 2013-08-15 NOTE — Patient Instructions (Signed)
20 Krystal [name]  08/15/2013   Your procedure is scheduled on:  08/18/13  [facility].  Pick up the phone at the desk and dial [phone].   Call this number if you have problems the morning of surgery: [phone]   Remember:   Do not eat food:After Midnight.  Do not drink clear liquids: After Midnight.  Take these medicines the morning of surgery with A SIP OF WATER: Zantac, 1/2 Bedtime dose of insulin   Do not wear jewelry, make-up or nail polish.  Do not wear lotions, powders, or perfumes. You may wear deodorant.  Do not shave 48 hours prior to surgery.  Do not bring valuables to the hospital.  [facility] is not   responsible for any belongings or valuables brought to the hospital.  Contacts, dentures or bridgework may not be worn into surgery.  Leave suitcase in the car. After surgery it may be brought to your room.  For patients admitted to the hospital, checkout time is 11:00 AM the day of              discharge.   Patients discharged the day of surgery will not be allowed to drive             home.  Name and phone number of your driver: NA  Special Instructions:   Shower using CHG 2 nights before surgery and the night before surgery.  If you shower the day of surgery use CHG.  Use special wash - you have one bottle of CHG for all showers.  You should use approximately 1/3 of the bottle for each shower.   Please read over the following fact sheets that you were given:   Surgical Site Infection Prevention

## 2013-08-17 MED ORDER — CEFAZOLIN SODIUM 10 G IJ SOLR
3.0000 g | INTRAMUSCULAR | Status: AC
  Administered 2013-08-18: 3 g via INTRAVENOUS
  Filled 2013-08-17: qty 3000

## 2013-08-18 ENCOUNTER — Encounter (HOSPITAL_COMMUNITY): Payer: Self-pay | Admitting: Anesthesiology

## 2013-08-18 ENCOUNTER — Inpatient Hospital Stay (HOSPITAL_COMMUNITY): Payer: Medicaid Other

## 2013-08-18 ENCOUNTER — Encounter (HOSPITAL_COMMUNITY): Payer: Medicaid Other | Admitting: Anesthesiology

## 2013-08-18 ENCOUNTER — Inpatient Hospital Stay (HOSPITAL_COMMUNITY): Payer: Medicaid Other | Admitting: Anesthesiology

## 2013-08-18 ENCOUNTER — Inpatient Hospital Stay (HOSPITAL_COMMUNITY)
Admission: AD | Admit: 2013-08-18 | Discharge: 2013-08-20 | DRG: 765 | Disposition: A | Discharge status: Home / Self Care | Payer: Medicaid Other | Source: Ambulatory Visit | Attending: Family Medicine | Admitting: Family Medicine

## 2013-08-18 ENCOUNTER — Encounter (HOSPITAL_COMMUNITY)
Admission: AD | Disposition: A | Discharge status: Home / Self Care | Payer: Self-pay | Source: Ambulatory Visit | Attending: Family Medicine

## 2013-08-18 DIAGNOSIS — O3663X0 Maternal care for excessive fetal growth, third trimester, not applicable or unspecified: Secondary | ICD-10-CM

## 2013-08-18 DIAGNOSIS — O09293 Supervision of pregnancy with other poor reproductive or obstetric history, third trimester: Secondary | ICD-10-CM

## 2013-08-18 DIAGNOSIS — O99334 Smoking (tobacco) complicating childbirth: Secondary | ICD-10-CM | POA: Diagnosis present

## 2013-08-18 DIAGNOSIS — O3660X0 Maternal care for excessive fetal growth, unspecified trimester, not applicable or unspecified: Principal | ICD-10-CM | POA: Diagnosis present

## 2013-08-18 DIAGNOSIS — O24919 Unspecified diabetes mellitus in pregnancy, unspecified trimester: Secondary | ICD-10-CM

## 2013-08-18 DIAGNOSIS — E119 Type 2 diabetes mellitus without complications: Secondary | ICD-10-CM | POA: Diagnosis present

## 2013-08-18 DIAGNOSIS — Z98891 History of uterine scar from previous surgery: Secondary | ICD-10-CM

## 2013-08-18 DIAGNOSIS — O34219 Maternal care for unspecified type scar from previous cesarean delivery: Secondary | ICD-10-CM

## 2013-08-18 DIAGNOSIS — O2432 Unspecified pre-existing diabetes mellitus in childbirth: Secondary | ICD-10-CM | POA: Diagnosis present

## 2013-08-18 DIAGNOSIS — O335XX0 Maternal care for disproportion due to unusually large fetus, not applicable or unspecified: Secondary | ICD-10-CM

## 2013-08-18 DIAGNOSIS — O0990 Supervision of high risk pregnancy, unspecified, unspecified trimester: Secondary | ICD-10-CM

## 2013-08-18 LAB — GLUCOSE, CAPILLARY
GLUCOSE-CAPILLARY: 82 mg/dL (ref 70–99)
GLUCOSE-CAPILLARY: 86 mg/dL (ref 70–99)
GLUCOSE-CAPILLARY: 122 mg/dL — AB (ref 70–99)
Glucose-Capillary: 72 mg/dL (ref 70–99)

## 2013-08-18 SURGERY — Surgical Case
Anesthesia: Spinal

## 2013-08-18 MED ORDER — KETOROLAC TROMETHAMINE 30 MG/ML IJ SOLN: 30.0000 mg | Freq: Four times a day (QID) | INTRAMUSCULAR | Status: DC | PRN

## 2013-08-18 MED ORDER — FENTANYL CITRATE 0.05 MG/ML IJ SOLN: 25.0000 ug | INTRAMUSCULAR | Status: DC | PRN

## 2013-08-18 MED ORDER — LANOLIN HYDROUS EX OINT: 1.0000 "application " | TOPICAL_OINTMENT | CUTANEOUS | Status: DC | PRN

## 2013-08-18 MED ORDER — FENTANYL CITRATE 0.05 MG/ML IJ SOLN
INTRAMUSCULAR | Status: AC
  Filled 2013-08-18: qty 2

## 2013-08-18 MED ORDER — SODIUM CHLORIDE 0.9 % IJ SOLN
3.0000 mL | INTRAMUSCULAR | Status: DC | PRN
Start: 2013-08-18 — End: 2013-08-20

## 2013-08-18 MED ORDER — GLIPIZIDE 2.5 MG HALF TABLET
2.5000 mg | ORAL_TABLET | Freq: Two times a day (BID) | ORAL | Status: DC
  Administered 2013-08-19: 2.5 mg via ORAL
  Filled 2013-08-18: qty 1

## 2013-08-18 MED ORDER — OXYCODONE-ACETAMINOPHEN 5-325 MG PO TABS
1.0000 | ORAL_TABLET | ORAL | Status: DC | PRN
  Administered 2013-08-19: 1 via ORAL
  Administered 2013-08-19: 2 via ORAL
  Administered 2013-08-20 (×2): 1 via ORAL
  Filled 2013-08-18 (×3): qty 1
  Filled 2013-08-18: qty 2

## 2013-08-18 MED ORDER — BUPIVACAINE IN DEXTROSE 0.75-8.25 % IT SOLN
INTRATHECAL | Status: DC | PRN
  Administered 2013-08-18: 10.5 mg via INTRATHECAL

## 2013-08-18 MED ORDER — IBUPROFEN 600 MG PO TABS
600.0000 mg | ORAL_TABLET | Freq: Four times a day (QID) | ORAL | Status: DC
  Administered 2013-08-18 – 2013-08-20 (×7): 600 mg via ORAL
  Filled 2013-08-18 (×7): qty 1

## 2013-08-18 MED ORDER — SCOPOLAMINE 1 MG/3DAYS TD PT72
1.0000 | MEDICATED_PATCH | Freq: Once | TRANSDERMAL | Status: DC
  Filled 2013-08-18: qty 1

## 2013-08-18 MED ORDER — NALBUPHINE HCL 10 MG/ML IJ SOLN
5.0000 mg | INTRAMUSCULAR | Status: DC | PRN
  Filled 2013-08-18: qty 1

## 2013-08-18 MED ORDER — ONDANSETRON HCL 4 MG/2ML IJ SOLN
INTRAMUSCULAR | Status: DC | PRN
  Administered 2013-08-18: 4 mg via INTRAVENOUS

## 2013-08-18 MED ORDER — ONDANSETRON HCL 4 MG/2ML IJ SOLN
INTRAMUSCULAR | Status: AC
  Filled 2013-08-18: qty 2

## 2013-08-18 MED ORDER — NALOXONE HCL 1 MG/ML IJ SOLN
1.0000 ug/kg/h | INTRAVENOUS | Status: DC | PRN
  Filled 2013-08-18: qty 2

## 2013-08-18 MED ORDER — METOCLOPRAMIDE HCL 5 MG/ML IJ SOLN: 10.0000 mg | Freq: Three times a day (TID) | INTRAMUSCULAR | Status: DC | PRN

## 2013-08-18 MED ORDER — TETANUS-DIPHTH-ACELL PERTUSSIS 5-2.5-18.5 LF-MCG/0.5 IM SUSP: 0.5000 mL | Freq: Once | INTRAMUSCULAR | Status: DC

## 2013-08-18 MED ORDER — MEPERIDINE HCL 25 MG/ML IJ SOLN: 6.2500 mg | INTRAMUSCULAR | Status: DC | PRN

## 2013-08-18 MED ORDER — FENTANYL CITRATE 0.05 MG/ML IJ SOLN
INTRAMUSCULAR | Status: DC | PRN
  Administered 2013-08-18: 20 ug via INTRATHECAL

## 2013-08-18 MED ORDER — MEASLES, MUMPS & RUBELLA VAC ~~LOC~~ INJ
0.5000 mL | INJECTION | Freq: Once | SUBCUTANEOUS | Status: DC
  Filled 2013-08-18: qty 0.5

## 2013-08-18 MED ORDER — KETOROLAC TROMETHAMINE 30 MG/ML IJ SOLN
30.0000 mg | Freq: Four times a day (QID) | INTRAMUSCULAR | Status: DC | PRN
  Administered 2013-08-18: 30 mg via INTRAMUSCULAR

## 2013-08-18 MED ORDER — ONDANSETRON HCL 4 MG/2ML IJ SOLN: 4.0000 mg | INTRAMUSCULAR | Status: DC | PRN

## 2013-08-18 MED ORDER — MORPHINE SULFATE 0.5 MG/ML IJ SOLN
INTRAMUSCULAR | Status: AC
  Filled 2013-08-18: qty 10

## 2013-08-18 MED ORDER — OXYTOCIN 40 UNITS IN LACTATED RINGERS INFUSION - SIMPLE MED: 62.5000 mL/h | INTRAVENOUS | Status: AC

## 2013-08-18 MED ORDER — BUPIVACAINE HCL (PF) 0.25 % IJ SOLN
INTRAMUSCULAR | Status: DC | PRN
  Administered 2013-08-18: 30 mL

## 2013-08-18 MED ORDER — SENNOSIDES-DOCUSATE SODIUM 8.6-50 MG PO TABS
2.0000 | ORAL_TABLET | ORAL | Status: DC
  Administered 2013-08-18 – 2013-08-19 (×2): 2 via ORAL
  Filled 2013-08-18 (×2): qty 2

## 2013-08-18 MED ORDER — ACETAMINOPHEN 500 MG PO TABS
1000.0000 mg | ORAL_TABLET | Freq: Four times a day (QID) | ORAL | Status: AC
  Administered 2013-08-18: 1000 mg via ORAL
  Filled 2013-08-18: qty 2

## 2013-08-18 MED ORDER — MIDAZOLAM HCL 2 MG/2ML IJ SOLN: 0.5000 mg | Freq: Once | INTRAMUSCULAR | Status: DC | PRN

## 2013-08-18 MED ORDER — DIPHENHYDRAMINE HCL 50 MG/ML IJ SOLN
INTRAMUSCULAR | Status: AC
  Administered 2013-08-18: 14:00:00 12.5 mg via INTRAVENOUS
  Filled 2013-08-18: qty 1

## 2013-08-18 MED ORDER — SIMETHICONE 80 MG PO CHEW
80.0000 mg | CHEWABLE_TABLET | Freq: Three times a day (TID) | ORAL | Status: DC
  Administered 2013-08-18 – 2013-08-20 (×6): 80 mg via ORAL
  Filled 2013-08-18 (×7): qty 1

## 2013-08-18 MED ORDER — ZOLPIDEM TARTRATE 5 MG PO TABS: 5.0000 mg | ORAL_TABLET | Freq: Every evening | ORAL | Status: DC | PRN

## 2013-08-18 MED ORDER — DIPHENHYDRAMINE HCL 50 MG/ML IJ SOLN
12.5000 mg | INTRAMUSCULAR | Status: DC | PRN
  Administered 2013-08-18: 12.5 mg via INTRAVENOUS

## 2013-08-18 MED ORDER — DIPHENHYDRAMINE HCL 50 MG/ML IJ SOLN: 25.0000 mg | INTRAMUSCULAR | Status: DC | PRN

## 2013-08-18 MED ORDER — DIBUCAINE 1 % RE OINT: 1.0000 "application " | TOPICAL_OINTMENT | RECTAL | Status: DC | PRN

## 2013-08-18 MED ORDER — SIMETHICONE 80 MG PO CHEW: 80.0000 mg | CHEWABLE_TABLET | ORAL | Status: DC | PRN

## 2013-08-18 MED ORDER — PROMETHAZINE HCL 25 MG/ML IJ SOLN: 6.2500 mg | INTRAMUSCULAR | Status: DC | PRN

## 2013-08-18 MED ORDER — LACTATED RINGERS IV SOLN
Freq: Once | INTRAVENOUS | Status: AC
  Administered 2013-08-18: 11:00:00 via INTRAVENOUS

## 2013-08-18 MED ORDER — MAGNESIUM HYDROXIDE 400 MG/5ML PO SUSP: 30.0000 mL | ORAL | Status: DC | PRN

## 2013-08-18 MED ORDER — LACTATED RINGERS IV SOLN
INTRAVENOUS | Status: DC
  Administered 2013-08-18 (×3): via INTRAVENOUS

## 2013-08-18 MED ORDER — MENTHOL 3 MG MT LOZG: 1.0000 | LOZENGE | OROMUCOSAL | Status: DC | PRN

## 2013-08-18 MED ORDER — ONDANSETRON HCL 4 MG PO TABS: 4.0000 mg | ORAL_TABLET | ORAL | Status: DC | PRN

## 2013-08-18 MED ORDER — LACTATED RINGERS IV SOLN: INTRAVENOUS | Status: DC

## 2013-08-18 MED ORDER — PHENYLEPHRINE HCL 10 MG/ML IJ SOLN
INTRAMUSCULAR | Status: AC
  Filled 2013-08-18: qty 1

## 2013-08-18 MED ORDER — MORPHINE SULFATE (PF) 0.5 MG/ML IJ SOLN
INTRAMUSCULAR | Status: DC | PRN
  Administered 2013-08-18: .2 mg via INTRATHECAL

## 2013-08-18 MED ORDER — OXYTOCIN 10 UNIT/ML IJ SOLN
INTRAMUSCULAR | Status: AC
  Filled 2013-08-18: qty 4

## 2013-08-18 MED ORDER — SIMETHICONE 80 MG PO CHEW
80.0000 mg | CHEWABLE_TABLET | ORAL | Status: DC
  Administered 2013-08-18 – 2013-08-19 (×2): 80 mg via ORAL
  Filled 2013-08-18: qty 1

## 2013-08-18 MED ORDER — DIPHENHYDRAMINE HCL 25 MG PO CAPS: 25.0000 mg | ORAL_CAPSULE | Freq: Four times a day (QID) | ORAL | Status: DC | PRN

## 2013-08-18 MED ORDER — PRENATAL MULTIVITAMIN CH
1.0000 | ORAL_TABLET | Freq: Every day | ORAL | Status: DC
  Administered 2013-08-19 – 2013-08-20 (×2): 1 via ORAL
  Filled 2013-08-18 (×2): qty 1

## 2013-08-18 MED ORDER — BUPIVACAINE HCL (PF) 0.25 % IJ SOLN
INTRAMUSCULAR | Status: AC
Start: 2013-08-18 — End: 2013-08-18
  Filled 2013-08-18: qty 30

## 2013-08-18 MED ORDER — PHENYLEPHRINE 8 MG IN D5W 100 ML (0.08MG/ML) PREMIX OPTIME
INJECTION | INTRAVENOUS | Status: DC | PRN
  Administered 2013-08-18: 60 ug/min via INTRAVENOUS

## 2013-08-18 MED ORDER — DIPHENHYDRAMINE HCL 25 MG PO CAPS
25.0000 mg | ORAL_CAPSULE | ORAL | Status: DC | PRN
  Filled 2013-08-18: qty 1

## 2013-08-18 MED ORDER — NALOXONE HCL 0.4 MG/ML IJ SOLN: 0.4000 mg | INTRAMUSCULAR | Status: DC | PRN

## 2013-08-18 MED ORDER — OXYTOCIN 10 UNIT/ML IJ SOLN
40.0000 [IU] | INTRAVENOUS | Status: DC | PRN
  Administered 2013-08-18: 40 [IU] via INTRAVENOUS

## 2013-08-18 MED ORDER — KETOROLAC TROMETHAMINE 30 MG/ML IJ SOLN
INTRAMUSCULAR | Status: AC
  Filled 2013-08-18: qty 1

## 2013-08-18 MED ORDER — SCOPOLAMINE 1 MG/3DAYS TD PT72
1.0000 | MEDICATED_PATCH | Freq: Once | TRANSDERMAL | Status: DC
  Administered 2013-08-18: 1.5 mg via TRANSDERMAL

## 2013-08-18 MED ORDER — LACTATED RINGERS IV SOLN
INTRAVENOUS | Status: DC
  Administered 2013-08-18: 22:00:00 via INTRAVENOUS

## 2013-08-18 MED ORDER — WITCH HAZEL-GLYCERIN EX PADS: 1.0000 "application " | MEDICATED_PAD | CUTANEOUS | Status: DC | PRN

## 2013-08-18 MED ORDER — IBUPROFEN 600 MG PO TABS: 600.0000 mg | ORAL_TABLET | Freq: Four times a day (QID) | ORAL | Status: DC | PRN

## 2013-08-18 MED ORDER — METFORMIN HCL 500 MG PO TABS
1000.0000 mg | ORAL_TABLET | Freq: Two times a day (BID) | ORAL | Status: DC
  Administered 2013-08-18 – 2013-08-20 (×4): 1000 mg via ORAL
  Filled 2013-08-18 (×4): qty 2

## 2013-08-18 SURGICAL SUPPLY — 38 items
APL SKNCLS STERI-STRIP NONHPOA (GAUZE/BANDAGES/DRESSINGS) ×1
BENZOIN TINCTURE PRP APPL 2/3 (GAUZE/BANDAGES/DRESSINGS) ×2 IMPLANT
CLAMP CORD UMBIL (MISCELLANEOUS) IMPLANT
CLOSURE WOUND 1/2 X4 (GAUZE/BANDAGES/DRESSINGS) ×1
CLOTH BEACON ORANGE TIMEOUT ST (SAFETY) ×3 IMPLANT
DRAPE LG THREE QUARTER DISP (DRAPES) IMPLANT
DRSG OPSITE POSTOP 4X10 (GAUZE/BANDAGES/DRESSINGS) ×3 IMPLANT
DURAPREP 26ML APPLICATOR (WOUND CARE) ×3 IMPLANT
ELECT REM PT RETURN 9FT ADLT (ELECTROSURGICAL) ×3
ELECTRODE REM PT RTRN 9FT ADLT (ELECTROSURGICAL) ×1 IMPLANT
EXTRACTOR VACUUM M CUP 4 TUBE (SUCTIONS) IMPLANT
EXTRACTOR VACUUM M CUP 4' TUBE (SUCTIONS)
GLOVE BIOGEL PI IND STRL 7.0 (GLOVE) ×1 IMPLANT
GLOVE BIOGEL PI INDICATOR 7.0 (GLOVE) ×2
GLOVE ECLIPSE 7.0 STRL STRAW (GLOVE) ×6 IMPLANT
GOWN PREVENTION PLUS XLARGE (GOWN DISPOSABLE) ×3 IMPLANT
GOWN STRL REIN XL XLG (GOWN DISPOSABLE) ×3 IMPLANT
KIT ABG SYR 3ML LUER SLIP (SYRINGE) IMPLANT
NDL HYPO 25X5/8 SAFETYGLIDE (NEEDLE) IMPLANT
NEEDLE HYPO 22GX1.5 SAFETY (NEEDLE) ×3 IMPLANT
NEEDLE HYPO 25X5/8 SAFETYGLIDE (NEEDLE) IMPLANT
NS IRRIG 1000ML POUR BTL (IV SOLUTION) ×3 IMPLANT
PACK C SECTION WH (CUSTOM PROCEDURE TRAY) ×3 IMPLANT
PAD ABD 7.5X8 STRL (GAUZE/BANDAGES/DRESSINGS) ×2 IMPLANT
PAD OB MATERNITY 4.3X12.25 (PERSONAL CARE ITEMS) ×3 IMPLANT
RTRCTR C-SECT PINK 25CM LRG (MISCELLANEOUS) IMPLANT
SPONGE GAUZE 4X4 12PLY (GAUZE/BANDAGES/DRESSINGS) ×2 IMPLANT
STAPLER VISISTAT 35W (STAPLE) IMPLANT
STRIP CLOSURE SKIN 1/2X4 (GAUZE/BANDAGES/DRESSINGS) ×1 IMPLANT
SUT PLAIN 2 0 XLH (SUTURE) ×2 IMPLANT
SUT VIC AB 0 CTX 36 (SUTURE) ×12
SUT VIC AB 0 CTX36XBRD ANBCTRL (SUTURE) ×3 IMPLANT
SUT VIC AB 4-0 KS 27 (SUTURE) IMPLANT
SYR 30ML LL (SYRINGE) ×3 IMPLANT
TAPE CLOTH SURG 4X10 WHT LF (GAUZE/BANDAGES/DRESSINGS) ×2 IMPLANT
TOWEL OR 17X24 6PK STRL BLUE (TOWEL DISPOSABLE) ×3 IMPLANT
TRAY FOLEY CATH 14FR (SET/KITS/TRAYS/PACK) ×3 IMPLANT
WATER STERILE IRR 1000ML POUR (IV SOLUTION) ×3 IMPLANT

## 2013-08-18 NOTE — Anesthesia Preprocedure Evaluation (Addendum)
Anesthesia Evaluation  Patient identified by MRN, date of birth, ID band Patient awake    Reviewed: Allergy & Precautions, H&P , NPO status , Patient's Chart, lab work & pertinent test results  Airway Mallampati: III      Dental no notable dental hx.    Pulmonary Current Smoker,    Pulmonary exam normal       Cardiovascular Exercise Tolerance: Good     Neuro/Psych  Headaches,    GI/Hepatic GERD-  ,  Endo/Other  diabetesMorbid obesity  Renal/GU      Musculoskeletal   Abdominal (+) + obese,   Peds  Hematology   Anesthesia Other Findings   Reproductive/Obstetrics (+) Pregnancy                          Anesthesia Physical Anesthesia Plan  ASA: III  Anesthesia Plan: Spinal   Post-op Pain Management:    Induction:   Airway Management Planned:   Additional Equipment:   Intra-op Plan:   Post-operative Plan:   Informed Consent: I have reviewed the patients History and Physical, chart, labs and discussed the procedure including the risks, benefits and alternatives for the proposed anesthesia with the patient or authorized representative who has indicated his/her understanding and acceptance.     Plan Discussed with: Anesthesiologist, CRNA and Surgeon  Anesthesia Plan Comments:         Anesthesia Quick Evaluation

## 2013-08-18 NOTE — Op Note (Signed)
Krystal [name] PROCEDURE DATE: 08/18/2013  PREOPERATIVE DIAGNOSES: Intrauterine pregnancy at  1875w0d weeks gestation; hx of shoulder dystocia with previous baby, macrosomia, type 2 DM, elective primary  POSTOPERATIVE DIAGNOSES: The same  PROCEDURE: Primary Low Transverse Cesarean Section  SURGEON:  Dr. Tinnie Gensanya Pratt  ASSISTANT:  Dr. Rulon AbideKeli Larenz Frasier  ANESTHESIOLOGIST: Dr. Malen GauzeFoster  INDICATIONS: Krystal [name] is a 28 y.o. G2P1001 at 7575w0d here for cesarean section secondary to the indications listed under preoperative diagnoses; please see preoperative note for further details.  The risks of cesarean section were discussed with the patient including but were not limited to: bleeding which may require transfusion or reoperation; infection which may require antibiotics; injury to bowel, bladder, ureters or other surrounding organs; injury to the fetus; need for additional procedures including hysterectomy in the event of a life-threatening hemorrhage; placental abnormalities wth subsequent pregnancies, incisional problems, thromboembolic phenomenon and other postoperative/anesthesia complications.   The patient concurred with the proposed plan, giving informed written consent for the procedure.    FINDINGS:  Viable female infant in cephalic presentation.  Apgars 8 and 9.  9lb 8oz. Thick meconium amniotic fluid.  Intact placenta, three vessel cord.  Normal uterus, fallopian tubes and ovaries bilaterally.  ANESTHESIA: Spinal INTRAVENOUS FLUIDS: 2400 ml ESTIMATED BLOOD LOSS: 700 ml URINE OUTPUT:  200 ml SPECIMENS: Placenta sent to L&D COMPLICATIONS: None immediate  PROCEDURE IN DETAIL:  The patient preoperatively received intravenous antibiotics and had sequential compression devices applied to her lower extremities.  She was then taken to the operating room where spinal anesthesia was administered. She was then placed in a dorsal supine position with a leftward tilt, and prepped and draped in a  sterile manner.  A foley catheter was placed into her bladder and attached to constant gravity.  After an adequate timeout was performed, a Pfannenstiel skin incision was made with scalpel and carried through to the underlying layer of fascia. The fascia was incised in the midline, and this incision was extended bilaterally bluntly.  The fascial incision was dissected bluntly from the underlying rectus muscles. A similar process was carried out on the inferior aspect of the fascial incision. The rectus muscles were separated in the midline bluntly and the peritoneum was entered bluntly. Attention was turned to the lower uterine segment where a low transverse hysterotomy was made with a scalpel and extended bilaterally bluntly.  The infant was successfully delivered through thick meconium stained fluid, the cord was clamped and cut and the infant was handed over to awaiting neonatology team. Uterine massage was then administered, and the placenta delivered intact with a three-vessel cord. The uterus was then cleared of clot and debris.  The hysterotomy was closed with 0 Vicryl in a running locked fashion, and an imbricating layer was also placed with 0 Vicryl. The pelvis was cleared of all clot and debris. Hemostasis was confirmed on all surfaces.  The peritoneum and the muscles were reapproximated using 0 Vicryl in a purse string fashion. The fascia was then closed using 0 Vicryl in a running fashion.  The subcutaneous layer was irrigated, then reapproximated with 2-0 plain gut interrupted stitches, and 40 ml of 0.5% Marcaine was injected subcutaneously around the incision.  The skin was closed with a 4-0 Vicryl subcuticular stitch. The patient tolerated the procedure well. Sponge, lap, instrument and needle counts were correct x 2.  She was taken to the recovery room in stable condition.

## 2013-08-18 NOTE — Op Note (Signed)
I was present for all aspects of this surgery.

## 2013-08-18 NOTE — Anesthesia Procedure Notes (Addendum)
Spinal  Patient location during procedure: OR Start time: 08/18/2013 12:15 PM End time: 08/18/2013 12:18 PM Staffing Anesthesiologist: Leilani AbleHATCHETT, Naika Noto Performed by: anesthesiologist  Preanesthetic Checklist Completed: patient identified, surgical consent, pre-op evaluation, timeout performed, IV checked, risks and benefits discussed and monitors and equipment checked Spinal Block Patient position: sitting Prep: DuraPrep Patient monitoring: heart rate, cardiac monitor, continuous pulse ox and blood pressure Approach: midline Location: L3-4 Injection technique: single-shot Needle Needle type: Pencan  Needle gauge: 24 G Needle length: 9 cm Needle insertion depth: 7 cm Assessment Sensory level: T4

## 2013-08-18 NOTE — H&P (Signed)
Attestation of Attending Supervision of Advanced Practitioner (PA/CNM/NP): Evaluation and management procedures were performed by the Advanced Practitioner under my supervision and collaboration.  I have reviewed the Advanced Practitioner's note and chart, and I agree with the management and plan.  PRATT,TANYA S, MD [facility] Attending 08/18/2013 1:15 PM   

## 2013-08-18 NOTE — H&P (Signed)
  Krystal [name] is an 28 y.o. G2P1001 4563w0d female    Chief Complaint: IUP @ 39 wks, LGA with history of moderate shoulder dystocia   HPI: Multiparous female with moderate shoulder dystocia with 8 lb 13 oz fetus. Current EFW is above that with [facility]. Advised for primary C-section.    Past Medical History   Diagnosis  Date   .  Diabetes mellitus    .  Depression  bi-polar   .  Obesity     Past Surgical History   Procedure  Laterality  Date   .  No past surgeries     .  Mouth surgery      Family History   Problem  Relation  Age of Onset   .  Asthma  Mother    .  Diabetes  Mother    .  Alcohol abuse  Mother      Social History: reports that she has been smoking Cigarettes. She has a 1.5 pack-year smoking history. She has never used smokeless tobacco. She reports that she does not drink alcohol or use illicit drugs.  Allergies: No Known Allergies  No prescriptions prior to admission   Pertinent items are noted in HPI.   Last menstrual period 10/08/2012.  LMP 10/08/2012  General appearance: alert, cooperative and appears stated age  Head: Normocephalic, without obvious abnormality, atraumatic  Neck: supple, symmetrical, trachea midline  Lungs: normal effort  Heart: regular rate and rhythm  Abdomen: normal findings: soft, non-tender and gravid  Extremities: extremities normal, atraumatic, no cyanosis or edema  Skin: Skin color, texture, turgor normal. No rashes or lesions  Neurologic: Grossly normal   Lab Results   Component  Value  Date    WBC  7.2  07/20/2013    HGB  12.0  07/20/2013    HCT  36.0  07/20/2013    MCV  84.5  07/20/2013    PLT  239  07/20/2013    Lab Results   Component  Value  Date    PREGTESTUR  Positive  12/27/2012     Assessment/Plan  Patient Active Problem List    Diagnosis  Date Noted   .  Supervision of high-risk pregnancy  01/20/2013     Priority: High   .  H/O shoulder dystocia in prior pregnancy, currently pregnant   01/20/2013     Priority: Medium   .  Diabetes mellitus, antepartum  12/27/2012     Priority: Medium   .  DIABETES MELLITUS II, UNCOMPLICATED  10/04/2006     Priority: Medium   .  TOBACCO DEPENDENCE  10/04/2006     Priority: Medium   .  Macrosomia affecting management of mother in third trimester  08/11/2013   .  PUPP (pruritic urticarial papules and plaques of pregnancy)  04/14/2013   .  GERD (gastroesophageal reflux disease)  08/30/2012   .  HIDRADENITIS SUPPURATIVA  03/17/2009   .  ECZEMA, ATOPIC  08/26/2007   .  DEPRESSION, MILD  05/08/2007   .  OBESITY, NOS  10/04/2006   .  MIGRAINE, UNSPEC., W/O INTRACTABLE MIGRAINE  10/04/2006     For Primary C-section.  Risks include but are not limited to bleeding, infection, injury to surrounding structures, including bowel, bladder and ureters, blood clots, and death. Likelihood of success is high.    Cire Deyarmin, Redmond BasemanKELI L, MD

## 2013-08-18 NOTE — Lactation Note (Signed)
This note was copied from the chart of Krystal [name] Cangelosi. Lactation Consultation Note     Initial consult with this mom of a NICU baby, full term, RDS with mec stained fluid. Mom wants t breast feed, and has told me her first child did not latch due to tongue tie, and she reports that this baby also is tongue tie. She states she wants the frenulum clipped prior to baby's discharge, so she can breast feed. I told mom if this can not be done while the baby is in the NICU, [facility] be [facility] be [facility] after [facility]. i set up DEP for mom in premie setting, and along with hand expression, mom was able to express 10-11 mls of colostrum. Some teachngn on pumping done from the NICU booklet, and I will do more with mom tommorow. I advised her to pump every 3 hours, but to sleep tonight, and to continue with pumping in the morning.   Patient Name: Krystal [name] Priebe ZOXWR'UToday's Date: 08/18/2013 Reason for consult: Initial assessment;NICU baby   Maternal Data Formula Feeding for Exclusion: No (baby in the nICU) Infant to breast within first hour of birth: No Breastfeeding delayed due to:: Infant status Has patient been taught Hand Expression?: Yes Does the patient have breastfeeding experience prior to this delivery?: No  Feeding    LATCH Score/Interventions                      Lactation Tools Discussed/Used Tools: Pump Breast pump type: Double-Electric Breast Pump WIC Program: Yes Pump Review: Setup, frequency, and cleaning;Milk Storage;Other (comment) (hand exp, teaching) Initiated by:: c Luvina Poirier rn within 4 hours of delivery Date initiated:: 08/18/13   Consult Status Consult Status: Follow-up Date: 08/19/13 Follow-up type: In-patient    Alfred LevinsLee, Zayden Hahne Anne 08/18/2013, 4:50 PM

## 2013-08-18 NOTE — Progress Notes (Signed)
UR completed 

## 2013-08-18 NOTE — Transfer of Care (Signed)
Immediate Anesthesia Transfer of Care Note  Patient: Krystal [name]  Procedure(s) Performed: Procedure(s): CESAREAN SECTION (N/A)  Patient Location: PACU  Anesthesia Type:Spinal  Level of Consciousness: awake, alert  and oriented  Airway & Oxygen Therapy: Patient Spontanous Breathing  Post-op Assessment: Report given to PACU RN and Post -op Vital signs reviewed and stable  Post vital signs: Reviewed and stable  Complications: No apparent anesthesia complications

## 2013-08-18 NOTE — Anesthesia Postprocedure Evaluation (Signed)
Anesthesia Post Note  Patient: Krystal [name]  Procedure(s) Performed: Procedure(s) (LRB): CESAREAN SECTION (N/A)  Anesthesia type: Spinal  Patient location: PACU  Post pain: Pain level controlled  Post assessment: Post-op Vital signs reviewed  Last Vitals:  Filed Vitals:   08/18/13 1031  BP: 129/76  Pulse: 96  Temp: 36.6 C  Resp: 20    Post vital signs: Reviewed  Level of consciousness: awake  Complications: No apparent anesthesia complications

## 2013-08-19 ENCOUNTER — Encounter (HOSPITAL_COMMUNITY): Payer: Self-pay | Admitting: Family Medicine

## 2013-08-19 LAB — CBC
HEMATOCRIT: 34 % — AB (ref 36.0–46.0)
Hemoglobin: 11.2 g/dL — ABNORMAL LOW (ref 12.0–15.0)
MCH: 28.1 pg (ref 26.0–34.0)
MCHC: 32.9 g/dL (ref 30.0–36.0)
MCV: 85.4 fL (ref 78.0–100.0)
Platelets: 215 10*3/uL (ref 150–400)
RBC: 3.98 MIL/uL (ref 3.87–5.11)
RDW: 14.2 % (ref 11.5–15.5)
WBC: 10.2 10*3/uL (ref 4.0–10.5)

## 2013-08-19 LAB — GLUCOSE, CAPILLARY
GLUCOSE-CAPILLARY: 152 mg/dL — AB (ref 70–99)
GLUCOSE-CAPILLARY: 160 mg/dL — AB (ref 70–99)
Glucose-Capillary: 108 mg/dL — ABNORMAL HIGH (ref 70–99)

## 2013-08-19 MED ORDER — GLYBURIDE 2.5 MG PO TABS
2.5000 mg | ORAL_TABLET | Freq: Two times a day (BID) | ORAL | Status: DC
  Administered 2013-08-19 – 2013-08-20 (×2): 2.5 mg via ORAL
  Filled 2013-08-19 (×2): qty 1

## 2013-08-19 NOTE — Progress Notes (Signed)
08/19/13 1100  Clinical Encounter Type  Visited With Patient  Visit Type Initial;Social support;Spiritual support  Referral From Chaplain (Katy Grovetownlaussen, [place], [place])  Spiritual Encounters  Spiritual Needs Emotional  Stress Factors  Patient Stress Factors Loss of control (unexpected NICU stay and separation from 26yo)   Krystal [name] was working to stay positive and describes herself as "coping well," given stress of unexpected NICU stay and baby [name]'s need for extra support.  She named that this is a hard and worrisome time.  Per pt, she has good family support (husband, mom, SIL; SIL is keeping Laurali's son Irving ShowsMiguel, age 513, so he is in a familiar environment with cousins).  Introduced Engineer, technical salespiritual Care and chaplain availability.  [address]Chaplain Magaby Rumberger [place], [place] [phone]

## 2013-08-19 NOTE — Anesthesia Postprocedure Evaluation (Signed)
Anesthesia Post Note  Patient: Krystal [name]  Procedure(s) Performed: Procedure(s) (LRB): CESAREAN SECTION (N/A)  Anesthesia type: Epidural  Patient location: Mother/Baby  Post pain: Pain level controlled  Post assessment: Post-op Vital signs reviewed  Last Vitals:  Filed Vitals:   08/19/13 0515  BP: 120/74  Pulse: 78  Temp: 36.4 C  Resp: 18    Post vital signs: Reviewed  Level of consciousness:alert  Complications: No apparent anesthesia complications

## 2013-08-19 NOTE — Progress Notes (Signed)
Clinical Social Work Department PSYCHOSOCIAL ASSESSMENT - MATERNAL/CHILD 08/19/2013  Patient:  [name],[name]  Account Number:  [id number]  Admit Date:  08/18/2013  Childs Name:   Krystal [name]    Clinical Social Worker:  Lakira Ogando, LCSW   Date/Time:  08/19/2013 05:13 PM  Date Referred:  08/19/2013   Referral source  RN     Referred reason  NICU   Other referral source:    I:  FAMILY / HOME ENVIRONMENT Child's legal guardian:  PARENT  Guardian - Name Guardian - Age Guardian - Address  Krystal [name] 27 [address] [place], [place] [address]  Krystal [name]  same   Other household support members/support persons Name Relationship DOB  Krystal [name] SON 3   Other support:   MOB lists her Mother, Father, Step-mother and sister-in-law as her greatest support people other than FOB.    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  She states the winter months are difficult because there is not as much work.   Financial resources:  Medicaid If Medicaid - County:  [place] Other  WIC  Food Stamps   School / Grade:   Maternity Care Coordinator / Child Services Coordination / Early Interventions:  Cultural issues impacting care:   None stated    III  STRENGTHS Strengths  Adequate Resources  Compliance with medical plan  Home prepared for Child (including basic supplies)  Other - See comment  Supportive family/friends  Understanding of illness   Strength comment:  Pediatric follow up will be at [facility].   IV  RISK FACTORS AND CURRENT PROBLEMS Current Problem:  None   Risk Factor & Current Problem Patient Issue Family Issue Risk Factor / Current Problem Comment   N N     V  SOCIAL WORK ASSESSMENT  CSW met with MOB in her third floor room to introduce myself, offer support and complete assessment due to NICU  admission.  MOB was pleasant and welcoming of CSW's visit.  She states she was just about to go back to NICU to visit baby as she is finding it hard to be away from him.  CSW explained CSW's role and support services offered by NICU CSW.  MOB states she feels she is coping well at this time and seems to have a good understanding of the situation, but states she feels it will be very hard to leave him here in the hospital when she discharges if he is not ready to go at that time.  She is hopeful that he will be ready when she is.  CSW discussed the reality that he may not be ready when she is and that although it is emotionally difficult to leave her baby in the hospital, NICU intervention is necessary at this point and that she does not want him to go before he is ready.  CSW validated her feelings of sadness over the situation.  MOB stated understanding and is thankful that baby is doing as well as he is and that his condition is not worse.  MOB reports a good relationship with her husband and states he is the father of her three year old as well.  She states her husband has not shown much emotion, but she knows he is scared over baby's medical situation.  She states her husband is supportive and that she has a good support system   of other family members as well.  She reports having all necessary baby supplies at home.  CSW asked MOB about the hx of Depression or Bipolar that is noted in her medial record.  MOB states she has never actually been diagnosed with either and denies having symptoms of Mania or Depression in the past.  She states, "years ago," her doctor put her on an antidepressant because she was having stomach pain, but she states she did not take the medication.  She denies PPD after her other child.  CSW discussed signs and symptoms of PPD.  MOB was very attentive and states the only symptom she has had so far is not being able to sleep, but she feels it is not due to anxiety, but rather excitement.   CSW feels this is appropriate after the birth of a baby.  MOB commits to talking with her doctor or calling CSW if she has emotional concerns at any time.  MOB states no questions, concerns or needs at this time.  CSW is not aware of any social concerns at this time.  MOB appears to be coping well.     VI SOCIAL WORK PLAN Social Work Plan  Psychosocial Support/Ongoing Assessment of Needs  Patient/Family Education   Type of pt/family education:   PPD signs and symptoms  Ongoing support services offered by NICU CSW.   If child protective services report - county:   If child protective services report - date:   Information/referral to community resources comment:   No referral needs noted at this time.   Other social work plan:     

## 2013-08-19 NOTE — Lactation Note (Signed)
This note was copied from the chart of Krystal [name] Hanke. Lactation Consultation Note     Follow up consult with thsi mom fo a term NICU baby, now 28 hours post partum. She had lots of colostrum yesterday, and is getting only drops today. I explained to mom this can happen, but to keep pum,ping, and she soul transition into mature milk at 48 -72 hours post partum.   Patient Name: Krystal [name] Elizardo ZOXWR'UToday's Date: 08/19/2013 Reason for consult: Follow-up assessment;NICU baby   Maternal Data    Feeding    LATCH Score/Interventions                      Lactation Tools Discussed/Used     Consult Status Consult Status: Follow-up Date: 08/20/13 Follow-up type: In-patient    Alfred LevinsLee, Makyia Erxleben Anne 08/19/2013, 7:15 PM

## 2013-08-19 NOTE — Progress Notes (Signed)
CSW attempted to meet with MOB to offer support and complete assessment due to NICU admission, but she was not in her room at this time.  CSW will attempt again at a later time. 

## 2013-08-19 NOTE — Progress Notes (Signed)
CSW returned to meet with MOB as planned, but she was not in her room.   

## 2013-08-19 NOTE — Progress Notes (Signed)
Subjective: Postpartum Day 1: Cesarean Delivery Patient reports tolerating PO and no problems voiding.  Ambulating, controlling pain with Motrin  Objective: Vital signs in last 24 hours: Temp:  [97.5 F (36.4 C)-97.9 F (36.6 C)] 97.6 F (36.4 C) (01/13 0515) Pulse Rate:  [68-96] 78 (01/13 0515) Resp:  [16-24] 18 (01/13 0515) BP: (92-129)/(54-76) 120/74 mmHg (01/13 0515) SpO2:  [94 %-100 %] 97 % (01/13 0515) Weight:  [136.079 kg (300 lb)] 136.079 kg (300 lb) (01/12 1600) Sugars 82-126 Physical Exam:  General: alert, cooperative, appears stated age and no distress Lochia: appropriate Uterine Fundus: firm U-1 appropriately tender Incision: no significant drainage DVT Evaluation: No evidence of DVT seen on physical exam. Negative Homan's sign. No cords or calf tenderness. Significant bil pitting edema to knee   Recent Labs  08/19/13 0510  HGB 11.2*  HCT 34.0*    Assessment/Plan: Status post Cesarean section. Doing well postoperatively.  Continue current care, continue pumping, MOC: POP/OCP DM2: continue DM meds, monitor PP sugars.  Jolyn LentODOM, Seif Teichert RYAN 08/19/2013, 7:25 AM

## 2013-08-19 NOTE — Progress Notes (Signed)
CSW attempted to meet with MOB again, but she was resting and requested that CSW return at a later time.  CSW will attempt again at a later time to complete assessment due to NICU admission.

## 2013-08-20 LAB — GLUCOSE, CAPILLARY: Glucose-Capillary: 126 mg/dL — ABNORMAL HIGH (ref 70–99)

## 2013-08-20 MED ORDER — MEASLES, MUMPS & RUBELLA VAC ~~LOC~~ INJ
0.5000 mL | INJECTION | Freq: Once | SUBCUTANEOUS | Status: DC
  Filled 2013-08-20: qty 0.5

## 2013-08-20 MED ORDER — GLYBURIDE 2.5 MG PO TABS: 2.5000 mg | ORAL_TABLET | Freq: Two times a day (BID) | ORAL | Status: DC

## 2013-08-20 MED ORDER — IBUPROFEN 600 MG PO TABS: 600.0000 mg | ORAL_TABLET | Freq: Four times a day (QID) | ORAL | Status: DC | PRN

## 2013-08-20 MED ORDER — PNEUMOCOCCAL VAC POLYVALENT 25 MCG/0.5ML IJ INJ: 0.5000 mL | INJECTION | INTRAMUSCULAR | Status: DC

## 2013-08-20 MED ORDER — OXYCODONE-ACETAMINOPHEN 5-325 MG PO TABS: 1.0000 | ORAL_TABLET | ORAL | Status: DC | PRN

## 2013-08-20 MED ORDER — METFORMIN HCL 1000 MG PO TABS: 1000.0000 mg | ORAL_TABLET | Freq: Two times a day (BID) | ORAL | Status: DC

## 2013-08-20 NOTE — Progress Notes (Signed)
Pt out and ambulated  Well  Teaching complete   [facility]  [facility]

## 2013-08-20 NOTE — Discharge Summary (Signed)
Attestation of Attending Supervision of Advanced Practitioner (CNM/NP): Evaluation and management procedures were performed by the Advanced Practitioner under my supervision and collaboration.  I have reviewed the Advanced Practitioner's note and chart, and I agree with the management and plan.  Alegra Rost 08/20/2013 11:54 AM

## 2013-08-20 NOTE — Progress Notes (Signed)
Post Partum Day 2 for C/S Subjective: no complaints, up ad lib, voiding, tolerating PO, + flatus and No BM to this point  Objective: Blood pressure 111/74, pulse 94, temperature 97.8 F (36.6 C), temperature source Oral, resp. rate 18, height 5\' 4"  (1.626 m), weight 136.079 kg (300 lb), last menstrual period 10/08/2012, SpO2 96.00%, unknown if currently breastfeeding.  Physical Exam:  General: alert, cooperative and appears stated age 89Lochia: appropriate Uterine Fundus: firm Incision: healing well, no significant drainage DVT Evaluation: No evidence of DVT seen on physical exam.   Recent Labs  08/19/13 0510  HGB 11.2*  HCT 34.0*    Assessment/Plan: Plan for discharge tomorrow MOC: POP/OCP DM II - Continue Metformin/Glyburide  - PP sugars ranging from 108-160 on 1/13   LOS: 2 days   Gildardo CrankerHess, Merlinda Wrubel 08/20/2013, 7:29 AM

## 2013-08-20 NOTE — Discharge Instructions (Signed)
Cesarean Delivery °Care After °Refer to this sheet in the next few weeks. These instructions provide you with information on caring for yourself after your procedure. Your health care provider may also give you specific instructions. Your treatment has been planned according to current medical practices, but problems sometimes occur. Call your health care provider if you have any problems or questions after you go home. °HOME CARE INSTRUCTIONS  °· Only take over-the-counter or prescription medications as directed by your health care provider. °· Do not drink alcohol, especially if you are breastfeeding or taking medication to relieve pain. °· Do not chew or smoke tobacco. °· Continue to use good perineal care. Good perineal care includes: °· Wiping your perineum from front to back. °· Keeping your perineum clean. °· Check your surgical cut (incision) daily for increased redness, drainage, swelling, or separation of skin. °· Clean your incision gently with soap and water every day, and then pat it dry. If your health care provider says it is OK, leave the incision uncovered. Use a bandage (dressing) if the incision is draining fluid or appears irritated. If the adhesive strips across the incision do not fall off within 7 days, carefully peel them off. °· Hug a pillow when coughing or sneezing until your incision is healed. This helps to relieve pain. °· Do not use tampons or douche until your health care provider says it is okay. °· Shower, wash your hair, and take tub baths as directed by your health care provider. °· Wear a well-fitting bra that provides breast support. °· Limit wearing support panties or control-top hose. °· Drink enough fluids to keep your urine clear or pale yellow. °· Eat high-fiber foods such as whole grain cereals and breads, brown rice, beans, and fresh fruits and vegetables every day. These foods may help prevent or relieve constipation. °· Resume activities such as climbing stairs,  driving, lifting, exercising, or traveling as directed by your health care provider. °· Talk to your health care provider about resuming sexual activities. This is dependent upon your risk of infection, your rate of healing, and your comfort and desire to resume sexual activity. °· Try to have someone help you with your household activities and your newborn for at least a few days after you leave the hospital. °· Rest as much as possible. Try to rest or take a nap when your newborn is sleeping. °· Increase your activities gradually. °· Keep all of your scheduled postpartum appointments. It is very important to keep your scheduled follow-up appointments. At these appointments, your health care provider will be checking to make sure that you are healing physically and emotionally. °SEEK MEDICAL CARE IF:  °· You are passing large clots from your vagina. Save any clots to show your health care provider. °· You have a foul smelling discharge from your vagina. °· You have trouble urinating. °· You are urinating frequently. °· You have pain when you urinate. °· You have a change in your bowel movements. °· You have increasing redness, pain, or swelling near your incision. °· You have pus draining from your incision. °· Your incision is separating. °· You have painful, hard, or reddened breasts. °· You have a severe headache. °· You have blurred vision or see spots. °· You feel sad or depressed. °· You have thoughts of hurting yourself or your newborn. °· You have questions about your care, the care of your newborn, or medications. °· You are dizzy or lightheaded. °· You have a rash. °· You   have pain, redness, or swelling at the site of the removed intravenous access (IV) tube. °· You have nausea or vomiting. °· You stopped breastfeeding and have not had a menstrual period within 12 weeks of stopping. °· You are not breastfeeding and have not had a menstrual period within 12 weeks of delivery. °· You have a fever. °SEEK  IMMEDIATE MEDICAL CARE IF: °· You have persistent pain. °· You have chest pain. °· You have shortness of breath. °· You faint. °· You have leg pain. °· You have stomach pain. °· Your vaginal bleeding saturates 2 or more sanitary pads in 1 hour. °MAKE SURE YOU:  °· Understand these instructions. °· Will watch your condition. °· Will get help right away if you are not doing well or get worse. °Document Released: 04/15/2002 Document Revised: 03/26/2013 Document Reviewed: 03/20/2012 °ExitCare® Patient Information ©2014 [employer]. ° ° ° °

## 2013-08-20 NOTE — Progress Notes (Signed)
Pt notified  That she did not get  Pneumonia   And MMR vaccine  And she should ask for it during   Follow up  Visit  Pt acknowleged   That   She would do this    Pt left and needed to be at wic  At 200 pm

## 2013-08-20 NOTE — Discharge Summary (Signed)
Obstetric Discharge Summary Reason for Admission: cesarean section for macrosomia, hx of shoulder dystocia in first delivery with smaller baby. DM type 2.  Prenatal Procedures: none Intrapartum Procedures: cesarean: low cervical, transverse Postpartum Procedures: none Complications-Operative and Postpartum: none Hemoglobin  Date Value Range Status  08/19/2013 11.2* 12.0 - 15.0 g/dL Final     HCT  Date Value Range Status  08/19/2013 34.0* 36.0 - 46.0 % Final    Physical Exam:  General: alert, cooperative and no distress Lochia: appropriate Uterine Fundus: firm Incision: healing well, no significant drainage, no dehiscence, no significant erythema DVT Evaluation: No evidence of DVT seen on physical exam.  Discharge Diagnoses: Term Pregnancy-delivered  Discharge Information: Date: 08/20/2013 Activity: pelvic rest Diet: routine Medications: PNV and Ibuprofen Condition: stable Instructions: refer to practice specific booklet Discharge to: home Follow-up Information   [facility] (as scheduled)    Specialty:  Obstetrics and Gynecology   Contact information:   [address] [place] [id number] [phone]      Newborn Data: Live born female  Birth Weight: 9 lb 8.7 oz (4330 g) APGAR: 8, 9  Home with mother.  Pt presented for elective c/s at 39 weeks for hx of DM, shoulder dystocia, and now fetal macrosomia.  Primary LTCS performed without complication. Post op care uncomplicated. She was doing well on post-op day 2 and requested early discharge. She is breast feeding and plans for mirena for contraception. Baby in NICU with meconium aspiration syndrome.     Efrat Zuidema L 08/20/2013, 10:53 AM

## 2013-08-20 NOTE — Lactation Note (Signed)
This note was copied from the chart of Krystal [name]. Lactation Consultation Note     Follow up consult with this mom of a term baby, now 47 hours post partum. Mom reports she pumped once last night at 2 am, and then when she woke up this morning. i advised her to pump at least 8 times a day now, every 3 hours around the clock. She reports getting drops of milk. I advised her continue with pumping and hand expression.  [facility] I will follow this family in the nICU.  Patient Name: Krystal Kendell Baneoelle Matulich ZOXWR'UToday's Date: 08/20/2013 Reason for consult: Follow-up assessment;NICU baby   Maternal Data    Feeding    LATCH Score/Interventions                      Lactation Tools Discussed/Used WIC Program: Yes (mom called to add baby and to let WIC know she is being discharged today,and will need a DEP) Pump Review: Setup, frequency, and cleaning (standard setting, pump up to 30 minutes, until she stops dripping.)   Consult Status Consult Status: Follow-up Follow-up type:  (prn in NICU)    Alfred LevinsLee, Adriannah Steinkamp Anne 08/20/2013, 11:54 AM

## 2013-08-25 ENCOUNTER — Ambulatory Visit: Payer: Self-pay

## 2013-08-25 NOTE — Lactation Note (Signed)
This note was copied from the chart of Krystal [name]. Lactation Consultation Note      Follow up consult with this mom of a term NICU baby, s/p frenotomy today. Unfortunately, the baby had a thin frenulum clipped, but also has a thicker, deeper frenulum, which will need to be assessed by an ENT/Surgeon. Krystal HuaDavid also has an upper lip frenulum, making it difficult for him to flange both top and bottom lips. I did a pre and post weight on [name], and with a 24 nipple shield, he transferred 36 mls at the breast. He then got frustrated, so we stopped, and let mom feed EBM by bottle. I also tried a 20 nipple shield on mom, which fit well, but Krystal HuaDavid would not suckle. I will see mom prior to his discharge tomorrow, and review a discharge plan with mom. She is aware he will need to be seen for the remaining frenulum limiting  His tongue movement, and then after this is done, for her to come back for an o/p lactation consult  Patient Name: Krystal [name] ZOXWR'UToday's Date: 08/25/2013 Reason for consult: Follow-up assessment;NICU baby   Maternal Data    Feeding Feeding Type: Breast Fed Nipple Type: Slow - flow Length of feed: 30 min  LATCH Score/Interventions Latch: Repeated attempts needed to sustain latch, nipple held in mouth throughout feeding, stimulation needed to elicit sucking reflex. (24 and then20 nipple shiled used. He sis better w 24) Intervention(s): Adjust position;Assist with latch;Breast massage;Breast compression  Audible Swallowing: Spontaneous and intermittent  Type of Nipple: Flat  Comfort (Breast/Nipple): Soft / non-tender     Hold (Positioning): Assistance needed to correctly position infant at breast and maintain latch. Intervention(s): Breastfeeding basics reviewed;Support Pillows;Position options;Skin to skin  LATCH Score: 7  Lactation Tools Discussed/Used Tools: Nipple Shields Nipple shield size: 20;24   Consult Status Consult Status: Follow-up Date:  08/26/13 Follow-up type: In-patient    Krystal LevinsLee, Krystal [name] 08/25/2013, 4:44 PM

## 2013-08-25 NOTE — Lactation Note (Signed)
This note was copied from the chart of Krystal Kendell Baneoelle Knab. Lactation Consultation Note      Follow up consult with this mom and term baby in the NICU, now 337 days old . Mom has a good milk supply, and I assisted her with latching baby. She was feeding him when I walked into the room, and I could hear lots of swallows. When he unlatched, mom's nip[ple was pinched, so I added a 24 nipple shield. The baby again latched and suckled, and there was a full shield of milk when the baby unlatched again. He also took EBM by bottle pc.  I spoke to NNP Mary Sellaricia Shelton, and Dr. Ezequiel EssexGable, about the baby's frenulum.As per Dr. Ezequiel EssexGable, she spoke to Dr. Eric FormWimmer, and is going to consult on this baby either later today, or tomorrow, to see if she agrees the frenulum needs to be clipped..  I will follow this family in the NICU  Patient Name: Krystal [name] Reason for consult: Follow-up assessment   Maternal Data    Feeding Feeding Type: Bottle Fed - Breast Milk Nipple Type: Slow - flow Length of feed: 20 min  LATCH Score/Interventions Latch: Repeated attempts needed to sustain latch, nipple held in mouth throughout feeding, stimulation needed to elicit sucking reflex. Intervention(s): Adjust position;Assist with latch (breast shiled)  Audible Swallowing: Spontaneous and intermittent  Type of Nipple: Flat Intervention(s):  (nipple shield used - size 24 with good transfer)  Comfort (Breast/Nipple): Soft / non-tender     Hold (Positioning): Assistance needed to correctly position infant at breast and maintain latch. Intervention(s): Breastfeeding basics reviewed;Support Pillows;Position options  LATCH Score: 7  Lactation Tools Discussed/Used Tools: Nipple Shields Nipple shield size: 24   Consult Status Consult Status: Follow-up Date: 08/26/13 Follow-up type: In-patient    Krystal [name], Krystal [name] [name], 10:58 AM

## 2013-08-27 ENCOUNTER — Encounter: Payer: Self-pay | Admitting: *Deleted

## 2013-08-27 ENCOUNTER — Telehealth: Payer: Self-pay | Admitting: *Deleted

## 2013-08-27 NOTE — Telephone Encounter (Signed)
Someone called for Krystal [name] and left a message stating [name] wants a refill of a medication- that she is not feeling well.  Per chart review had c/s 08/18/13. D/c 08/20/13 on Ibuprofen, percocet , prenatal vitamin.   Will need to call with Spanish interpreter.

## 2013-08-27 NOTE — Telephone Encounter (Signed)
Discussed with Dr. Jolayne Pantheronstant , no refills or other meds ordered- continue ibuprofen.  [facility] discussed to continue [facility]  Every 6 hours and that cramping should get better in the next few days. Advised to call back if pain worsens, come to MAU if any issues with incision. Evani voices understanding.

## 2013-08-28 ENCOUNTER — Other Ambulatory Visit: Payer: Self-pay | Admitting: Family Medicine

## 2013-09-04 ENCOUNTER — Telehealth: Payer: Self-pay | Admitting: Family Medicine

## 2013-09-04 NOTE — Telephone Encounter (Signed)
Called patient to discuss medications and lactation, per her request after son's 2 week well-child check today.  She is currently taking glyburide, metformin, and wants to know if she can take hydroxyzine.  Speaking with our pharmacist and reviewing Pregnancy/Lactation book, it appears metformin is safe in breastfeeding, but glyburide has risk of hypoglycemia in infant (category C) and hydroxyzine has risk of sedation in infant (category C) which is "unknown" or poorly-studied.  I suggested the following: - Continue metformin. - Stop glyburide for now.  - Hopefully breastfeeding helps her lose extra calories/weight to help control diabetes. - Check blood sugars fasting and 2 hours postprandial after largest meal (pt has testing supplies and was testing QID during pregnancy). - F/u with me in 1-2 weeks to f/u diabetes: we can review blood sugars and decide what to do with glyburide. If very elevated, can restart and feed cautiously or discuss pump and dump. Could also discuss switching to glipizide which is less long-acting (12 hours) and give one dose in AM, so patient can still feed early AM and in evening (and dump the rest). She will need to pump extra if this is the case, so that she can feed stored milk during hours when she has glipizide in her system. - She is due for A1c. We can also discuss referral to dietitian for help with weight loss, if patient is willing. - For itching, she can take loratadine which is okay in lactation. - Pt voiced understanding.  Krystal SingletonMaria T Jamika Sadek, MD

## 2013-09-15 ENCOUNTER — Ambulatory Visit: Payer: Medicaid Other | Admitting: Family Medicine

## 2013-09-18 ENCOUNTER — Telehealth: Payer: Self-pay | Admitting: *Deleted

## 2013-09-18 ENCOUNTER — Ambulatory Visit: Payer: Medicaid Other | Admitting: Nurse Practitioner

## 2013-09-18 NOTE — Telephone Encounter (Signed)
Called patient because she missed her postpartum visit. She stated that she forgot all about it and would like to reschedule. I gave her an appointment for March 6 at 1000.

## 2013-09-22 ENCOUNTER — Encounter: Payer: Self-pay | Admitting: Family Medicine

## 2013-09-22 ENCOUNTER — Ambulatory Visit (INDEPENDENT_AMBULATORY_CARE_PROVIDER_SITE_OTHER): Payer: Medicaid Other | Admitting: Family Medicine

## 2013-09-22 VITALS — BP 133/81 | HR 92 | Temp 97.6°F | Ht 64.0 in | Wt 268.0 lb

## 2013-09-22 DIAGNOSIS — F329 Major depressive disorder, single episode, unspecified: Secondary | ICD-10-CM

## 2013-09-22 DIAGNOSIS — E119 Type 2 diabetes mellitus without complications: Secondary | ICD-10-CM

## 2013-09-22 DIAGNOSIS — O26899 Other specified pregnancy related conditions, unspecified trimester: Secondary | ICD-10-CM

## 2013-09-22 DIAGNOSIS — L988 Other specified disorders of the skin and subcutaneous tissue: Secondary | ICD-10-CM

## 2013-09-22 DIAGNOSIS — F17200 Nicotine dependence, unspecified, uncomplicated: Secondary | ICD-10-CM

## 2013-09-22 DIAGNOSIS — F3289 Other specified depressive episodes: Secondary | ICD-10-CM

## 2013-09-22 DIAGNOSIS — Z98891 History of uterine scar from previous surgery: Secondary | ICD-10-CM

## 2013-09-22 DIAGNOSIS — O2686 Pruritic urticarial papules and plaques of pregnancy (PUPPP): Secondary | ICD-10-CM

## 2013-09-22 DIAGNOSIS — Z9889 Other specified postprocedural states: Secondary | ICD-10-CM

## 2013-09-22 LAB — COMPREHENSIVE METABOLIC PANEL
ALBUMIN: 4 g/dL (ref 3.5–5.2)
ALT: 19 U/L (ref 0–35)
AST: 14 U/L (ref 0–37)
Alkaline Phosphatase: 77 U/L (ref 39–117)
BUN: 11 mg/dL (ref 6–23)
CO2: 31 meq/L (ref 19–32)
CREATININE: 0.53 mg/dL (ref 0.50–1.10)
Calcium: 8.8 mg/dL (ref 8.4–10.5)
Chloride: 98 mEq/L (ref 96–112)
GLUCOSE: 126 mg/dL — AB (ref 70–99)
POTASSIUM: 3.7 meq/L (ref 3.5–5.3)
Sodium: 135 mEq/L (ref 135–145)
Total Bilirubin: 0.4 mg/dL (ref 0.2–1.2)
Total Protein: 6.7 g/dL (ref 6.0–8.3)

## 2013-09-22 LAB — POCT GLYCOSYLATED HEMOGLOBIN (HGB A1C): Hemoglobin A1C: 6

## 2013-09-22 NOTE — Patient Instructions (Signed)
For your diabetes, keep checking blood sugars and bring to next visit. Your A1c today is actually in pre-diabetes range. Keep working on weight loss. Great job. Walking daily would be a good addition. We can recheck your A1c in ~3 months.  For your urine, since you do not have any burning and did not want to check urine today, I think you are fine drinking plenty of cranberry juice for the next few days and staying hydrated. If you get burning or fevers or other worsening symptoms, come back.  Come back in 1 week or so for your postpartum visit.  We can do an IUD then or other birth control if you choose. Take 600-800mg  ibuprofen with breakfast that day before coming in. Keep working on quitting smoking.  See you soon!  Krystal SingletonMaria T Quillan Whitter, MD

## 2013-09-22 NOTE — Assessment & Plan Note (Signed)
Still smoking though decreased. 3 cigs/day. - Continue working on cessation.

## 2013-09-22 NOTE — Assessment & Plan Note (Signed)
Possibly related to PUPPS, though unlikely given no longer pregnant. Antihistamine did not help. Does not seem to have bug bites and no one else in household has symptoms. - CMET today.

## 2013-09-22 NOTE — Assessment & Plan Note (Signed)
A1c prediabetic. 6 from 7.4. - Bring blood sugar log to f/u. - Continue working on weight-loss. - Add daily walking to regimen. - Recheck A1c in 3 months. - Continue metformin for now.

## 2013-09-22 NOTE — Assessment & Plan Note (Signed)
No SI or HI and does not take out frustration on baby. Rooted in feeling overstretched. - discussed reasons to seek immediate care. - Discussed possibility of asking for more help. - Discussed yelling at wall instead of at husband. - Discussed it is okay to let baby cry if he is in a safe location on his back. - Reassured that this is temporary and Krystal [name] would be sleeping through the night in a few months. - Consider restarting zoloft at f/u.

## 2013-09-22 NOTE — Assessment & Plan Note (Signed)
Return in 1 week for postpartum visit. - At that time, discuss contraception. Pt is considering IUD at this point. Gave list of options. - If wanting IUD, take 600-800mg  ibuprofen with breakfast 1 hour prior to appointment.

## 2013-09-22 NOTE — Progress Notes (Signed)
Patient ID: Krystal [name], female   DOB: 07-Dec-1985, 28 y.o.   MRN: [id number] Subjective:   CC: F/u diabetes  HPI:   Diabetes f/u -   BGs - Did not bring log. Fastings in 112-140s and 2 hour postprandials are high 100s or right at 200. Never above 200. Symptoms: No blurred vision or headaches but has been more thirsty than normal in AM. No changes to voiding. No dizziness or jitteriness and no low BGs. Medications: Taking metformin, no SEs. Stopped glyburide last visit as patient is breastfeeding. Baby doing good, not seeming to be jittery. Exercise: Patient breastfeeds daily. She does not do any other exercise at this time. She has lost weight (from 300 lbs just before delivery to 268 lbs today).  She is due for A1c check.  Postpartum - Plan to discuss fully at f/u. Briefly, we discussed birth control options today, she is considering IUD.  - low-transverse c-section due to DM Type II during pregnancy with macrosomia and previous delivery with shoulder dystocia.  - Delivery at 39 weeks.  - Stressed postpartum, fighting with husband due to stress, money is tight, and husband does not help much. Her mom helps some but she is sick. Krystal [name] feels like she is doing this all on her own and feels mad. She denies sadness, SI, or HI. When she gets mad, she just walks away or yells at husband. She cannot sleep because no one helps her.  - Krystal [name] LeisureBaby has been doing okay, with current snotty nose. Baby feeds by mostly breast but also some bottle.  - She denies bleeding (bled 3-4 weeks postpartum, has not had a period yet).  - She has normal bowels and bladder function. Has some discomfort when urinates but no burning or fevers.  - She has not had sex yet.   Itching - Still present, worse at nighttime. She has no rashes but just feels itchy. It is worse at night, especially her inner legs. Antihistamine did not help.   Review of Systems - Per HPI.   PMH: Reviewed No H/o blood clots.  Smoking status:  Smokes 3 cigarettes daily.    Objective:  Physical Exam BP 133/81  Pulse 92  Temp(Src) 97.6 F (36.4 C) (Oral)  Ht 5\' 4"  (1.626 m)  Wt 268 lb (121.564 kg)  BMI 45.98 kg/m2 GEN: NAD HEENT: Atraumatic, normocephalic, neck supple, EOMI, sclera clear  CV: RRR, no murmurs, rubs, or gallops PULM: CTAB, normal effort ABD: Soft, nontender, nondistended, low-transverse c-section scar healing well, no organomegaly, uterus barely palpable below umbilicus, difficult to palpate due to obesity. SKIN: No rash or cyanosis; warm and well-perfused; skin excoriated on wrists and dry-appearing, no rash; extensor surfaces with small round scars  EXTR: No lower extremity edema or calf tenderness PSYCH: Mood and affect euthymic, normal rate and volume of speech, no SI/HI. NEURO: Awake, alert, no focal deficits grossly, normal speech    Assessment:     Krystal Chrisoelle M Yanko is a 28 y.o. female with h/o gestational diabetes here for follow-up.    Plan:     # See problem list and after visit summary for problem-specific plans. Of note, pt reported mild urinary discomfort with no dysuria or fever. - Increase hydration. - Try cranberry juice as this has helped pt in the past. - Return precautions reviewed.   # Health Maintenance: Not discussed.   Follow-up: Follow up in 1 week for postpartum visit.    Krystal SingletonMaria T Madissen Wyse, MD [facility]

## 2013-09-23 ENCOUNTER — Encounter: Payer: Self-pay | Admitting: Family Medicine

## 2013-09-24 ENCOUNTER — Other Ambulatory Visit: Payer: Self-pay | Admitting: Family Medicine

## 2013-09-29 ENCOUNTER — Encounter: Payer: Self-pay | Admitting: Family Medicine

## 2013-09-29 ENCOUNTER — Ambulatory Visit (INDEPENDENT_AMBULATORY_CARE_PROVIDER_SITE_OTHER): Payer: Medicaid Other | Admitting: Family Medicine

## 2013-09-29 VITALS — BP 92/67 | HR 93 | Temp 97.9°F | Ht 64.0 in | Wt 270.0 lb

## 2013-09-29 DIAGNOSIS — Z98891 History of uterine scar from previous surgery: Secondary | ICD-10-CM

## 2013-09-29 DIAGNOSIS — F17200 Nicotine dependence, unspecified, uncomplicated: Secondary | ICD-10-CM

## 2013-09-29 DIAGNOSIS — Z9889 Other specified postprocedural states: Secondary | ICD-10-CM

## 2013-09-29 DIAGNOSIS — Z309 Encounter for contraceptive management, unspecified: Secondary | ICD-10-CM

## 2013-09-29 DIAGNOSIS — E119 Type 2 diabetes mellitus without complications: Secondary | ICD-10-CM

## 2013-09-29 DIAGNOSIS — Z30430 Encounter for insertion of intrauterine contraceptive device: Secondary | ICD-10-CM

## 2013-09-29 LAB — POCT URINE PREGNANCY: PREG TEST UR: NEGATIVE

## 2013-09-29 MED ORDER — LEVONORGESTREL 20 MCG/24HR IU IUD
INTRAUTERINE_SYSTEM | Freq: Once | INTRAUTERINE | Status: AC
  Administered 2013-09-29: 12:00:00 1 via INTRAUTERINE

## 2013-09-29 NOTE — Progress Notes (Signed)
Patient ID: Krystal [name], female   DOB: 04-08-1986, 28 y.o.   MRN: [id number] Subjective:   CC: Postpartum visit, need for contraception  HPI:   Postpartum visit -  Patient is here for a postpartum visit. She is 6 weeks postpartum following a primary low cervical transverse Cesarean section for h/o moderate shoulder dystocia with smaller baby, macrosomia this pregnancy, and DM type 2. I have fully reviewed the prenatal and intrapartum course. The delivery was at 39 gestational weeks. Outcome: primary cesarean section, low transverse incision. Anesthesia: spinal.  Postpartum course has been stressful for mom due to fighting with husband, money issues, husband not being able to help much; pt's mother helps some but she is sick. Krystal [name] feels she is doing this on her own. She denies sadness, SI, or HI and when she gets mad, she walks away or yells at husband. Lacks sleep because no one helps her. Baby's course has been complicated by NICU for meconium aspiration but discharged, doing okay. Baby is feeding by both breast and bottle. Bleeding no bleeding. Bowel function is normal. Bladder function is normal. Patient is not sexually active. Contraception method is IUD planned for placement today. Postpartum depression screening: negative though significant stressors exist.   Need for contraception - Patient wants mirena today.  Review of Systems - Per HPI.   SH: Still stressed due to husband not helping much with baby due to his work schedule. Smoking status: Smoker.    Objective:  Physical Exam BP 92/67  Pulse 93  Temp(Src) 97.9 F (36.6 C) (Oral)  Ht 5\' 4"  (1.626 m)  Wt 270 lb (122.471 kg)  BMI 46.32 kg/m2 GEN: NAD HEENT: Atraumatic, normocephalic, neck supple, EOMI, sclera clear  PULM: Normal effort ABD: Soft, nontender, nondistended, NABS, no organomegaly SKIN: No rash or cyanosis; warm and well-perfused EXTR: No lower extremity edema or calf tenderness PSYCH: Mood and affect  euthymic, normal rate and volume of speech NEURO: Awake, alert, no focal deficits grossly, normal speech GU: Normal-appearing vagina and cervix. Uterine fundus unable to be palpated due to habitus. Cervix mildly anteverted.    IUD Insertion Procedure Note Patient identified, informed consent performed.  Discussed risks of irregular bleeding, cramping, infection, malpositioning or misplacement of the IUD outside the uterus which may require further procedures. Time out was performed.  Urine pregnancy test negative.  Speculum placed in the vagina.  Cervix visualized.  Cleaned with Betadine x 3.  Grasped anteriorly with a single tooth tenaculum.  Uterus sounded to >6 cm.  Mirena IUD placed per manufacturer's recommendations.  Strings trimmed to 4 cm. Tenaculum was removed, good hemostasis noted.  Patient tolerated procedure well.   Patient was given post-procedure instructions and the Mirena care card with expiration date.  Patient was also asked to check IUD strings periodically and follow up in 4-6 weeks for IUD check  Of note, initial IUD was a new type to clinic with strings not visualized outside applicator. Due to nurse, resident, and attending not being aware this was a new device with this feature changed, IUD was thought to be malfunctioning and on attempt of finding strings, it was deployed too early and thus would not deploy properly in cervix and fell out. Second IUD was placed successfully.  Assessment:     Krystal [name] is a 28 y.o. female here for postpartum visit and IUD placement.    Plan:     # See problem list and after visit summary for problem-specific plans.  # Health  Maintenance:  - Diabetic patient: LDL on f/u.  Follow-up: Follow up in 1 month for review of blood sugar and string check.   Leona Singleton, MD [facility]

## 2013-09-29 NOTE — Patient Instructions (Signed)
You had an IUD placed today. Keep your card. The IUD will need to be removed in 5 years from today. Check strings at home. Come back in 1 month for a string check or sooner if needed. Watch for excess bleeding or signs of infection as we discussed in the office. Remember: The IUD does not prevent transmission of STDs.  Krystal Singleton, MD   Levonorgestrel intrauterine device (IUD) What is this medicine? LEVONORGESTREL IUD (LEE voe nor jes trel) is a contraceptive (birth control) device. The device is placed inside the uterus by a healthcare professional. It is used to prevent pregnancy and can also be used to treat heavy bleeding that occurs during your period. Depending on the device, it can be used for 3 to 5 years. This medicine may be used for other purposes; ask your health care provider or pharmacist if you have questions. COMMON BRAND NAME(S): Gretta Cool What should I tell my health care provider before I take this medicine? They need to know if you have any of these conditions: -abnormal Pap smear -cancer of the breast, uterus, or cervix -diabetes -endometritis -genital or pelvic infection now or in the past -have more than one sexual partner or your partner has more than one partner -heart disease -history of an ectopic or tubal pregnancy -immune system problems -IUD in place -liver disease or tumor -problems with blood clots or take blood-thinners -use intravenous drugs -uterus of unusual shape -vaginal bleeding that has not been explained -an unusual or allergic reaction to levonorgestrel, other hormones, silicone, or polyethylene, medicines, foods, dyes, or preservatives -pregnant or trying to get pregnant -breast-feeding How should I use this medicine? This device is placed inside the uterus by a health care professional. Talk to your pediatrician regarding the use of this medicine in children. Special care may be needed. Overdosage: If you think you have  taken too much of this medicine contact a poison control center or emergency room at once. NOTE: This medicine is only for you. Do not share this medicine with others. What if I miss a dose? This does not apply. What may interact with this medicine? Do not take this medicine with any of the following medications: -amprenavir -bosentan -fosamprenavir This medicine may also interact with the following medications: -aprepitant -barbiturate medicines for inducing sleep or treating seizures -bexarotene -griseofulvin -medicines to treat seizures like carbamazepine, ethotoin, felbamate, oxcarbazepine, phenytoin, topiramate -modafinil -pioglitazone -rifabutin -rifampin -rifapentine -some medicines to treat HIV infection like atazanavir, indinavir, lopinavir, nelfinavir, tipranavir, ritonavir -St. John's wort -warfarin This list may not describe all possible interactions. Give your health care provider a list of all the medicines, herbs, non-prescription drugs, or dietary supplements you use. Also tell them if you smoke, drink alcohol, or use illegal drugs. Some items may interact with your medicine. What should I watch for while using this medicine? Visit your doctor or health care professional for regular check ups. See your doctor if you or your partner has sexual contact with others, becomes HIV positive, or gets a sexual transmitted disease. This product does not protect you against HIV infection (AIDS) or other sexually transmitted diseases. You can check the placement of the IUD yourself by reaching up to the top of your vagina with clean fingers to feel the threads. Do not pull on the threads. It is a good habit to check placement after each menstrual period. Call your doctor right away if you feel more of the IUD than just the threads or if you  cannot feel the threads at all. The IUD may come out by itself. You may become pregnant if the device comes out. If you notice that the IUD has  come out use a backup birth control method like condoms and call your health care provider. Using tampons will not change the position of the IUD and are okay to use during your period. What side effects may I notice from receiving this medicine? Side effects that you should report to your doctor or health care professional as soon as possible: -allergic reactions like skin rash, itching or hives, swelling of the face, lips, or tongue -fever, flu-like symptoms -genital sores -high blood pressure -no menstrual period for 6 weeks during use -pain, swelling, warmth in the leg -pelvic pain or tenderness -severe or sudden headache -signs of pregnancy -stomach cramping -sudden shortness of breath -trouble with balance, talking, or walking -unusual vaginal bleeding, discharge -yellowing of the eyes or skin Side effects that usually do not require medical attention (report to your doctor or health care professional if they continue or are bothersome): -acne -breast pain -change in sex drive or performance -changes in weight -cramping, dizziness, or faintness while the device is being inserted -headache -irregular menstrual bleeding within first 3 to 6 months of use -nausea This list may not describe all possible side effects. Call your doctor for medical advice about side effects. You may report side effects to FDA at 1-800-FDA-1088. Where should I keep my medicine? This does not apply. NOTE: This sheet is a summary. It may not cover all possible information. If you have questions about this medicine, talk to your doctor, pharmacist, or health care provider.  2014, Elsevier/Gold Standard. (2011-08-24 13:54:04)

## 2013-10-01 DIAGNOSIS — Z30430 Encounter for insertion of intrauterine contraceptive device: Secondary | ICD-10-CM | POA: Insufficient documentation

## 2013-10-01 NOTE — Assessment & Plan Note (Signed)
Doing well today other than continued stressor. Denies SI/HI. Having hard time finding child care so she can rest. Baby's father works hours such that it is difficult for him to help. - Encouraged her to ask for help. - F/u with me if stress seems to be building/not improving. - Reassured that baby should start to sleep more as he gets older, allowing pt to rest some. - Return precautions / reasons to seek immediate care discussed.

## 2013-10-01 NOTE — Assessment & Plan Note (Signed)
Stable on current oral regimen.  Breastfeeding. At last visit, encouraged introducing exercise. - F/u in 1 month to review blood sugars at home. - Recheck A1c in 3 months. 

## 2013-10-01 NOTE — Assessment & Plan Note (Addendum)
Desires Mirena for OCP.  - Inserted with no complications at today's postpartum visit. - F/u in 1 month for string check. - Return precautions reviewed. - Recommended using condoms for 1-2 weeks and that IUD does not prevent STDs. - Pt voices understanding.  - Dr Deirdre Priesthambliss present during IUD insertion.

## 2013-10-01 NOTE — Assessment & Plan Note (Deleted)
Stable on current oral regimen.  Breastfeeding. At last visit, encouraged introducing exercise. - F/u in 1 month to review blood sugars at home. - Recheck A1c in 3 months.

## 2013-10-01 NOTE — Assessment & Plan Note (Signed)
Still smoking. Discussed importance of smoking cessation.  

## 2013-10-10 ENCOUNTER — Ambulatory Visit: Payer: Medicaid Other | Admitting: Obstetrics and Gynecology

## 2013-10-21 ENCOUNTER — Emergency Department (HOSPITAL_COMMUNITY)
Admission: EM | Admit: 2013-10-21 | Discharge: 2013-10-21 | Disposition: A | Discharge status: Home / Self Care | Payer: Medicaid Other | Source: Home / Self Care | Attending: Family Medicine | Admitting: Family Medicine

## 2013-10-21 ENCOUNTER — Other Ambulatory Visit: Payer: Self-pay | Admitting: Family Medicine

## 2013-10-21 ENCOUNTER — Encounter (HOSPITAL_COMMUNITY): Payer: Self-pay | Admitting: Emergency Medicine

## 2013-10-21 ENCOUNTER — Encounter: Payer: Self-pay | Admitting: *Deleted

## 2013-10-21 DIAGNOSIS — K089 Disorder of teeth and supporting structures, unspecified: Secondary | ICD-10-CM

## 2013-10-21 DIAGNOSIS — K0889 Other specified disorders of teeth and supporting structures: Secondary | ICD-10-CM

## 2013-10-21 MED ORDER — OXYCODONE-ACETAMINOPHEN 5-325 MG PO TABS: 1.0000 | ORAL_TABLET | Freq: Four times a day (QID) | ORAL | Status: DC | PRN

## 2013-10-21 MED ORDER — DICLOFENAC SODIUM 75 MG PO TBEC: 75.0000 mg | DELAYED_RELEASE_TABLET | Freq: Two times a day (BID) | ORAL | Status: DC

## 2013-10-21 MED ORDER — DICLOFENAC POTASSIUM 50 MG PO TABS: 50.0000 mg | ORAL_TABLET | Freq: Three times a day (TID) | ORAL | Status: DC

## 2013-10-21 MED ORDER — AMOXICILLIN 500 MG PO CAPS: 500.0000 mg | ORAL_CAPSULE | Freq: Three times a day (TID) | ORAL | Status: DC

## 2013-10-21 NOTE — ED Notes (Signed)
Right, upper tooth pain, no history of this tooth bothering patient.  Also, c/o right face pain. Patient is waiting for her medicaid card-post delivery.  Has a dentist, but won't see her without medicaid.  Patient is currently breast feeding

## 2013-10-21 NOTE — Discharge Instructions (Signed)
Take medicine as prescribed, see your dentist as soon as possible °

## 2013-10-21 NOTE — ED Provider Notes (Signed)
CSN: [id number]     Arrival date & time 10/21/13  0908 History   First MD Initiated Contact with Patient 10/21/13 [id number]     Chief Complaint  Patient presents with  . Dental Pain   (Consider location/radiation/quality/duration/timing/severity/associated sxs/prior Treatment) Patient is a 28 y.o. female presenting with tooth pain. The history is provided by the patient.  Dental Pain Location:  Upper Upper teeth location:  2/RU 2nd molar Quality:  Throbbing Severity:  Moderate Onset quality:  Sudden Duration:  2 days Progression:  Worsening Chronicity:  New Context: dental caries   Associated symptoms: facial pain   Associated symptoms: no facial swelling   Risk factors: lack of dental care     Past Medical History  Diagnosis Date  . Diabetes mellitus   . Obesity   . Breast feeding status of mother    Past Surgical History  Procedure Laterality Date  . No past surgeries    . Mouth surgery    . Cesarean section N/A 08/18/2013    Procedure: CESAREAN SECTION;  Surgeon: Reva Bores, MD;  Location: [facility];  Service: Obstetrics;  Laterality: N/A;   Family History  Problem Relation Age of Onset  . Asthma Mother   . Diabetes Mother   . Alcohol abuse Mother    History  Substance Use Topics  . Smoking status: Current Every Day Smoker -- 0.25 packs/day for 6 years    Types: Cigarettes  . Smokeless tobacco: Never Used     Comment: trying to quitt  . Alcohol Use: No   OB History   Grav Para Term Preterm Abortions TAB SAB Ect Mult Living   2 2 2       2      Review of Systems  Constitutional: Negative.   HENT: Positive for dental problem. Negative for facial swelling.     Allergies  Review of patient's allergies indicates no known allergies.  Home Medications   Current Outpatient Rx  Name  Route  Sig  Dispense  Refill  . ACCU-CHEK FASTCLIX LANCETS MISC      CHECK BLOOD SUGAR ONCE DAILY EVERY MORNING   102 each   0   . amoxicillin (AMOXIL) 500 MG capsule    Oral   Take 1 capsule (500 mg total) by mouth 3 (three) times daily.   30 capsule   0   . diclofenac (CATAFLAM) 50 MG tablet   Oral   Take 1 tablet (50 mg total) by mouth 3 (three) times daily. For dental pain   15 tablet   0   . glucose blood (ACCU-CHEK ACTIVE STRIPS) test strip      Check Blood sugars four times a day   100 each   12     Check Blood Sugars four times daily   . ibuprofen (ADVIL,MOTRIN) 600 MG tablet   Oral   Take 1 tablet (600 mg total) by mouth every 6 (six) hours as needed for mild pain.   30 tablet   0   . levonorgestrel (MIRENA) 20 MCG/24HR IUD   Intrauterine   1 each by Intrauterine route once.         . metFORMIN (GLUCOPHAGE) 1000 MG tablet   Oral   Take 1 tablet (1,000 mg total) by mouth 2 (two) times daily with a meal.   60 tablet   2   . oxyCODONE-acetaminophen (PERCOCET/ROXICET) 5-325 MG per tablet   Oral   Take 1-2 tablets by mouth every 4 (four) hours as  needed for severe pain (moderate - severe pain).   30 tablet   0    BP 123/87  Pulse 84  Temp(Src) 97.2 F (36.2 C) (Oral)  Resp 16  SpO2 98%  Breastfeeding? Yes Physical Exam  Nursing note and vitals reviewed. Constitutional: She is oriented to person, place, and time. She appears well-developed and well-nourished.  HENT:  Right Ear: External ear normal.  Left Ear: External ear normal.  Mouth/Throat: Uvula is midline and oropharynx is clear and moist.    Neck: Normal range of motion. Neck supple.  Lymphadenopathy:    She has no cervical adenopathy.  Neurological: She is alert and oriented to person, place, and time.  Skin: Skin is warm and dry.    ED Course  Procedures (including critical care time) Labs Review Labs Reviewed - No data to display Imaging Review No results found.   MDM   1. Pain, dental        Linna HoffJames D Gerad Cornelio, MD 10/21/13 [phone]

## 2013-10-21 NOTE — Progress Notes (Signed)
Pt seen UC and dx tooth abscess---given abx and cataflam but told to pump and dump breat milk. She has no money for formula. We discussed. Her medicaid is in transition and cannot get to dentist until April (already has appt though). UC gave her 50  Mg cataflam (which is more expensive version) so I changed to diclofenac which is on 44 list--pregnancy category B---discussed. oxycodone for intense pain. She should start the abx. Use oxycodone sparingly and watch for sedation of baby although if she uses as directed and tries to time breast feeding appropriately as we discussed  Should not be issue.

## 2013-10-22 ENCOUNTER — Telehealth: Payer: Self-pay | Admitting: *Deleted

## 2013-10-22 NOTE — Telephone Encounter (Signed)
Pt in nursing office with concerns regarding medication given from urgent care.  Pt was seen at urgent care for dental pain and given Rx for  diclofenac (Cataflam) 50 mg tab.  Pt stated she told MD that she is breastfeeding and had concerns about taking this medication.  Precepted with Dr. Neal regarding the drug and breastfJennette Kettleeeding interaction.  Dr. Jennette KettleNeal consulted with Dr. Mauricio PoBreen and medication was changed to diclofenace (Voltaren) 75 mg tab and refill for Percocet was given.  Pt informed that medication can make baby sleepy and to feed baby before taking medication.  Pt stated understanding.  Clovis PuMartin, Dann Ventress L, RN

## 2013-10-30 ENCOUNTER — Encounter: Payer: Self-pay | Admitting: Family Medicine

## 2013-10-30 ENCOUNTER — Ambulatory Visit (INDEPENDENT_AMBULATORY_CARE_PROVIDER_SITE_OTHER): Payer: Medicaid Other | Admitting: Family Medicine

## 2013-10-30 VITALS — BP 118/83 | HR 102 | Temp 97.9°F | Ht 64.0 in | Wt 271.0 lb

## 2013-10-30 DIAGNOSIS — F329 Major depressive disorder, single episode, unspecified: Secondary | ICD-10-CM

## 2013-10-30 DIAGNOSIS — F3289 Other specified depressive episodes: Secondary | ICD-10-CM

## 2013-10-30 DIAGNOSIS — Z30430 Encounter for insertion of intrauterine contraceptive device: Secondary | ICD-10-CM

## 2013-10-30 DIAGNOSIS — K029 Dental caries, unspecified: Secondary | ICD-10-CM

## 2013-10-30 MED ORDER — OXYCODONE-ACETAMINOPHEN 5-325 MG PO TABS: 1.0000 | ORAL_TABLET | Freq: Every day | ORAL | Status: DC | PRN

## 2013-10-30 MED ORDER — IBUPROFEN 400 MG PO TABS: 400.0000 mg | ORAL_TABLET | Freq: Four times a day (QID) | ORAL | Status: DC | PRN

## 2013-10-30 NOTE — Patient Instructions (Signed)
Good to see you.  For your dental pain, take ibuprofen 400mg  every 6 hours as needed for pain. Take percocet (which has tylenol in it) every day as needed for severe pain. Follow up with your dentist is key. If you develop fevers or pain that is uncontrolled, come back immediately.  For your IUD, your strings are in place.  Come see me when I am next available so we can discuss stress.  Krystal SingletonMaria T Srihan Brutus, MD   Dental Pain Toothache is pain in or around a tooth. It may get worse with chewing or with cold or heat.  HOME CARE  Your dentist may use a numbing medicine during treatment. If so, you may need to avoid eating until the medicine wears off. Ask your dentist about this.  Only take medicine as told by your dentist or doctor.  Avoid chewing food near the painful tooth until after all treatment is done. Ask your dentist about this. GET HELP RIGHT AWAY IF:   The problem gets worse or new problems appear.  You have a fever.  There is redness and puffiness (swelling) of the face, jaw, or neck.  You cannot open your mouth.  There is pain in the jaw.  There is very bad pain that is not helped by medicine. MAKE SURE YOU:   Understand these instructions.  Will watch your condition.  Will get help right away if you are not doing well or get worse. Document Released: 01/10/2008 Document Revised: 10/16/2011 Document Reviewed: 01/10/2008 [facility] Patient Information 2014 [employer] [place].

## 2013-10-30 NOTE — Progress Notes (Signed)
Patient ID: Krystal [name], female   DOB: August 04, 1986, 28 y.o.   MRN: [id number] Subjective:   CC: Check IUD strings, tooth pain  HPI:   Check IUD strings - IUD has felt like it is in place and pt or partner cannot feel it. Does not report any pain or abnormal discharge. Does report vaginal bleeding since placing IUD that has been varying heaviness.  Tooth pain - Right jaw and tooth pain, no swelling or drainage or fevers. Ran out of percocet yesterday that she was taking q5 hours and helped. Other medications do not help. Has dental appt 4/5 (when she will have her non-preg medicaid). Takes abx TID that she got at urgent care, with no side effect other than ?yeast infection. Voltaren made her stomach "sick."   Tearful with nurse when discussing multiple somatic complaints and stresses that we have discussed at prior visits. PHQ-9 score 12 (3 for appetite and sleep) with no SI/HI. Given option, pt prefers to discuss tooth pain and f/u for stress discussion.  Review of Systems - Per HPI. Additionally, many other complaints today include: - Left earache - Left knee popping, chronic, has done PT in the past, gave her ways to make it better. Knows it's her weight, trying to lose weight. - Both pinkie toenails fell off, has happened before - Gash on right big toe after slamming foot, lots of pain - Allergies, trouble breathing.  PMH: - Meds: Not taking voltaren    Objective:  Physical Exam BP 118/83  Pulse 102  Temp(Src) 97.9 F (36.6 C) (Oral)  Ht 5\' 4"  (1.626 m)  Wt 271 lb (122.925 kg)  BMI 46.49 kg/m2  Breastfeeding? Yes GEN: NAD HEENT: Atraumatic, normocephalic, neck supple, EOMI, sclera clear , no swelling but right jaw mildly tender intermittemtly, no erythema or swelling, no abscess or pus PULM: normal effort ABD: Soft, nontender, nondistended, NABS, no organomegaly SKIN: No rash or cyanosis; warm and well-perfused EXTR: No lower extremity edema or calf tenderness PSYCH:  Mood and affect stressed but also able to joke with me, normal rate and volume of speech NEURO: Awake, alert, no focal deficits grossly, normal speech GU: Cervix and strings visualized about 4cm, mild cervical bleeding/friability    Assessment:     Krystal Chrisoelle M Ellena is a 28 y.o. female here for IUD string check, tooth pain, stress.    Plan:     # See problem list and after visit summary for problem-specific plans. Additionally, will discuss these at future visits: - Left earache - Left knee popping, chronic, has done PT in the past, gave her ways to make it better. Knows it's her weight, trying to lose weight. - Both pinkie toenails fell off, has happened before - Gash on right big toe after slamming foot, lots of pain - Allergies, trouble breathing.  Follow-up: Follow up in next available for f/u of stress.    Krystal SingletonMaria T Ayva Veilleux, MD [facility]

## 2013-11-02 DIAGNOSIS — K029 Dental caries, unspecified: Secondary | ICD-10-CM | POA: Insufficient documentation

## 2013-11-02 NOTE — Assessment & Plan Note (Addendum)
Dental carie/pain. No abscess seen and afebrile. Inconsistently tender on exam. - Continue oxycodone but decrease to daily and with expectation to stop soon. Rx'ed 20 tabs today. - Ibuprofen 400mg  q6 hours with food. - Has f/u with dentist. Emphasized this is only thing that will ultimately help. - F/u in 2 weeks. - Return precautions reviewed.

## 2013-11-02 NOTE — Assessment & Plan Note (Signed)
Also lots of anxiety/stress reported relating to husband not helping with multiple children. No SI/HI. PHQ-9 12. - Wishes to return to discuss at my next available.

## 2013-11-02 NOTE — Addendum Note (Signed)
Addended by: Simone CuriaHEKKEKANDAM, Jamekia Gannett T on: 11/02/2013 04:43 PM   Modules accepted: Level of Service

## 2013-11-02 NOTE — Assessment & Plan Note (Signed)
In place on exam today. SPotting is not unusual immediately after IUD placement. - Monitor.

## 2013-12-24 ENCOUNTER — Other Ambulatory Visit (HOSPITAL_COMMUNITY): Payer: Self-pay | Admitting: Family Medicine

## 2013-12-25 ENCOUNTER — Encounter: Payer: Self-pay | Admitting: Family Medicine

## 2013-12-25 ENCOUNTER — Ambulatory Visit (INDEPENDENT_AMBULATORY_CARE_PROVIDER_SITE_OTHER): Payer: Medicaid Other | Admitting: Family Medicine

## 2013-12-25 VITALS — BP 127/85 | HR 96 | Temp 98.3°F | Wt 275.0 lb

## 2013-12-25 DIAGNOSIS — K0889 Other specified disorders of teeth and supporting structures: Secondary | ICD-10-CM

## 2013-12-25 DIAGNOSIS — K089 Disorder of teeth and supporting structures, unspecified: Secondary | ICD-10-CM

## 2013-12-25 MED ORDER — PENICILLIN V POTASSIUM 500 MG PO TABS: 500.0000 mg | ORAL_TABLET | Freq: Four times a day (QID) | ORAL | Status: DC

## 2013-12-25 MED ORDER — HYDROCODONE-ACETAMINOPHEN 5-325 MG PO TABS: 1.0000 | ORAL_TABLET | Freq: Four times a day (QID) | ORAL | Status: DC | PRN

## 2013-12-25 NOTE — Patient Instructions (Signed)
Nice to meet you. Please take the antibiotic for your possible infection. You can also take the vicodin for pain if needed. This may get excreted in your breast milk. You should be aware of this and that it may affect your nursing infant. It may make the baby drowsy. If this occurs the baby will need to be seen in the office. Please go get the xray of your jaw. This will let us know if there are any abscesses.

## 2013-12-27 DIAGNOSIS — K0889 Other specified disorders of teeth and supporting structures: Secondary | ICD-10-CM | POA: Insufficient documentation

## 2013-12-27 NOTE — Progress Notes (Signed)
Patient ID: Krystal [name], female   DOB: 07-20-1986, 28 y.o.   MRN: [id number]  Marikay Alar, MD Phone: [phone]  Krystal [name] is a 28 y.o. female who presents today for an acute visit.  Patient presents with complaint of right sided dental pain. This is a new problem. It started 2 days ago. It is getting worse. It is aching. She has not noticed any drainage. She notes a temperature to 101.2 F. She has tried tylenol and salt water swishes for this. She states she had this tooth replaced several months ago due to carries. She has an appointment scheduled with the dentist in 2 weeks.  Patient is a smoker.   ROS: Per HPI   Physical Exam Filed Vitals:   12/25/13 1352  Temp: 98.3 F (36.8 C)    Gen: Well NAD HEENT: PERRL,  MMM, right posterior upper molar with swelling and erythema around the gum, there is tenderness to palpation, there is no apparent drainage from this siite    Assessment/Plan: Please see individual problem list.

## 2013-12-27 NOTE — Assessment & Plan Note (Signed)
Patient with pain and swelling at the site of a dental implant accompanied by fever. This is concerning for a dental abscess. She is currently afebrile and vital signs stable. Nursing called to get patients appointment moved up to next Tuesday. Will treat with penicillin for possible infection. Will treat pain with vicodin prn. Orthopantogram ordered. Patient is breast feeding occassionally and provided her with risks of these medications and breast feeding. Advised to formula feed until Tuesday when she sees the dentist.

## 2014-01-27 ENCOUNTER — Telehealth: Payer: Self-pay | Admitting: Family Medicine

## 2014-01-27 ENCOUNTER — Other Ambulatory Visit (HOSPITAL_COMMUNITY): Payer: Self-pay | Admitting: Family Medicine

## 2014-01-27 NOTE — Telephone Encounter (Signed)
Pt called because the pharmacy said that she needs a appointment with her PCP before getting any refills on her Mobic. She has made an appointment for 7/1, but she would like medication to get her to her appointment. jw

## 2014-01-28 NOTE — Telephone Encounter (Signed)
LM for patient to call back with information regarding NSAID use.  Theodore Virgin,CMA

## 2014-01-28 NOTE — Telephone Encounter (Signed)
Please find out if she is taking her diclofenac, ibuprofen (both on med list as of March 2015) or any other NSAID at this time. If not, I can refill this but also she should be aware it is not recommended during breastfeeding due to unknown excretion in breastmilk.  Krystal SingletonMaria T Thekkekandam, MD PGY-2, [facility]

## 2014-02-04 ENCOUNTER — Ambulatory Visit: Payer: Medicaid Other | Admitting: Family Medicine

## 2014-02-24 ENCOUNTER — Ambulatory Visit (INDEPENDENT_AMBULATORY_CARE_PROVIDER_SITE_OTHER): Payer: Medicaid Other | Admitting: Family Medicine

## 2014-02-24 ENCOUNTER — Encounter: Payer: Self-pay | Admitting: Family Medicine

## 2014-02-24 VITALS — BP 116/80 | HR 102 | Temp 98.5°F | Ht 64.0 in | Wt 276.7 lb

## 2014-02-24 DIAGNOSIS — F329 Major depressive disorder, single episode, unspecified: Secondary | ICD-10-CM

## 2014-02-24 DIAGNOSIS — F3289 Other specified depressive episodes: Secondary | ICD-10-CM

## 2014-02-24 DIAGNOSIS — E119 Type 2 diabetes mellitus without complications: Secondary | ICD-10-CM

## 2014-02-24 LAB — POCT GLYCOSYLATED HEMOGLOBIN (HGB A1C): HEMOGLOBIN A1C: 9.2

## 2014-02-24 MED ORDER — SERTRALINE HCL 100 MG PO TABS: 50.0000 mg | ORAL_TABLET | Freq: Every day | ORAL | Status: DC

## 2014-02-24 MED ORDER — METFORMIN HCL 1000 MG PO TABS: 1000.0000 mg | ORAL_TABLET | Freq: Two times a day (BID) | ORAL | Status: DC

## 2014-02-24 NOTE — Progress Notes (Signed)
Patient ID: Krystal [name], female   DOB: April 10, 1986, 28 y.o.   MRN: [id number] Subjective:   CC: Depression eval  HPI:   Depression eval Krystal [name] is a 28 y.o. female with h/o obesity, migraines, GERD, DM type II, and mild depression here for eval of her depression. She reports 1 month of worsening depressive symptoms, with "freaking out" episodes as well when she tries to calm herself by "shutting my brain off." She also has sleep issues (sometimes too much, sometimes not at all, even when she has time, despite cool, dark bedroom without TV on), crying spells, and worrying. She reports loving herself but having body image issues. She feels guilt about her mother's health (slipping back into alcoholism). She also has chronic diarrhea with no fevers/chills. She states Krystal [name] helps her but has to be away lots for work. She walks daily after dinner around her house and around the block. She talks to her friend Krystal [name] for support.   Diabetes A1C collected today but we did not discuss this. Pt needs refill.   Review of Systems - Per HPI.   SH: Mom has started drinking again, which stresses and worries Krystal [name]    Objective:  Physical Exam BP 116/80  Pulse 102  Temp(Src) 98.5 F (36.9 C) (Oral)  Ht 5\' 4"  (1.626 m)  Wt 276 lb 11.2 oz (125.51 kg)  BMI 47.47 kg/m2 GEN: NAD, seated in exam room PSYCH: PHQ-9 today is 14 with no SI/HI. Mood and affect appear anxious and stressed. Interacts appropriately. Normal rate and volume of speech.    Assessment:     Krystal [name] is a 28 y.o. female here for evaluation of depression.    Plan:     Evaluation of depression Not on medication. Amenable to starting. Does not have a therapist. Has an infant and a toddler, which provide significant stress. Mother is an alcoholic and recently started drinking again. PHQ-9 of 14, no SI/HI. Depressive symptoms flared up today. Likely some component of IBS. - Start zoloft 50mg  daily and increase in 1 week  to 100mg  daily. - Call [facility] for therapy, per Norma's recommendations - Also gave Dr Carola RhineKane's card for long-term therapy. If she cannot get Krystal [name] in, will recommend [email or web address] where Krystal [name] can find a therapist in her location. - F/u 1-2 weeks. At f/u, if sleep not improved, re-discuss sleep hygiene. - Suggested stress-relieving ideas: Prepare meals 1 week at a time so you do not need to do this daily. - Continue physical activity.  - Return precautions reviewed.  Diabetes mellitus A1c today though did not discuss. - F/u next available to discuss. - Refilled metformin.  # Health Maintenance: Not discussed.  Follow-up: Follow up at next available to f/u DM.  I have spent at least 25 minutes in room with patient, >50% of this time used in counseling.  Leona SingletonMaria T Wyllow Seigler, MD [facility]

## 2014-02-24 NOTE — Patient Instructions (Signed)
Start zoloft 50mg  daily and after 1 week increase to 100mg  daily. Follow up with me in 2 weeks or sooner if needed. Call [facility] to meet with her. Also, try Dr Pascal LuxKane. I would meet with both. Seek immediate care if you develop thoughts of harming yourself or others. Follow up with me as soon as possible to discuss diabetes.  Leona SingletonMaria T Ashana Tullo, MD

## 2014-02-28 NOTE — Assessment & Plan Note (Signed)
Not on medication. Amenable to starting. Does not have a therapist. Has an infant and a toddler, which provide significant stress. Mother is an alcoholic and recently started drinking again. PHQ-9 of 14, no SI/HI. Depressive symptoms flared up today. Likely some component of IBS. - Start zoloft 50mg  daily and increase in 1 week to 100mg  daily. - Call [facility] for therapy, per Norma's recommendations - Also gave Dr Carola RhineKane's card for long-term therapy. If she cannot get Alizey in, will recommend [email or web address] where [name] can find a therapist in her location. - F/u 1-2 weeks. At f/u, if sleep not improved, re-discuss sleep hygiene. - Suggested stress-relieving ideas: Prepare meals 1 week at a time so you do not need to do this daily. - Continue physical activity.  - Return precautions reviewed.

## 2014-02-28 NOTE — Assessment & Plan Note (Signed)
A1c today though did not discuss. - F/u next available to discuss. - Refilled metformin.

## 2014-03-02 ENCOUNTER — Encounter: Payer: Self-pay | Admitting: Family Medicine

## 2014-03-02 NOTE — Progress Notes (Signed)
Patient ID: Krystal [name], female   DOB: 08-22-1985, 28 y.o.   MRN: [id number] Reviewed: Agree with Dr. Melvia Heaps's documentation and management.

## 2014-03-19 ENCOUNTER — Encounter: Payer: Self-pay | Admitting: Family Medicine

## 2014-03-19 ENCOUNTER — Ambulatory Visit (INDEPENDENT_AMBULATORY_CARE_PROVIDER_SITE_OTHER): Payer: Medicaid Other | Admitting: Family Medicine

## 2014-03-19 VITALS — BP 129/84 | HR 98 | Resp 14 | Ht 64.0 in | Wt 276.0 lb

## 2014-03-19 DIAGNOSIS — R197 Diarrhea, unspecified: Secondary | ICD-10-CM

## 2014-03-19 DIAGNOSIS — F329 Major depressive disorder, single episode, unspecified: Secondary | ICD-10-CM

## 2014-03-19 DIAGNOSIS — E119 Type 2 diabetes mellitus without complications: Secondary | ICD-10-CM

## 2014-03-19 DIAGNOSIS — F3289 Other specified depressive episodes: Secondary | ICD-10-CM

## 2014-03-19 LAB — LIPID PANEL
CHOLESTEROL: 101 mg/dL (ref 0–200)
HDL: 34 mg/dL — ABNORMAL LOW (ref >39)
LDL Cholesterol: 52 mg/dL (ref 0–99)
TRIGLYCERIDES: 76 mg/dL (ref <150)
Total CHOL/HDL Ratio: 3 Ratio
VLDL: 15 mg/dL (ref 0–40)

## 2014-03-19 MED ORDER — GLUCOSE BLOOD VI STRP: ORAL_STRIP | Status: DC

## 2014-03-19 MED ORDER — BLOOD GLUCOSE MONITOR SYSTEM W/DEVICE KIT: PACK | Status: DC

## 2014-03-19 MED ORDER — ACCU-CHEK FASTCLIX LANCETS MISC: Status: DC

## 2014-03-19 MED ORDER — GLYBURIDE 2.5 MG PO TABS: 2.5000 mg | ORAL_TABLET | Freq: Two times a day (BID) | ORAL | Status: DC

## 2014-03-19 MED ORDER — METFORMIN HCL ER (OSM) 1000 MG PO TB24: 1000.0000 mg | ORAL_TABLET | Freq: Every day | ORAL | Status: DC

## 2014-03-19 NOTE — Progress Notes (Signed)
Patient ID: Krystal [name], female   DOB: [birth year], 28 y.o.   MRN: [id number] Subjective:   CC: Diabetes follow up, diarrhea  HPI:   Diabetes follow up Krystal [name] is a 28 y.o. female with h/o poorly controlled diabetes with A1c from 6 to 9.2 at last check. She had been taking metformin and glyburide during pregnancy but while breastfeeding we discontinued glyburide. She has been taking metformin 1000mg  BID and does not miss doses. She has had polydypsia especially in the morning. She occasionally has a sharp "muscle-ache" type chest pain lasting 1 second about once monthly that is random in timing and not exertional or related to emotion. She reports some foot and hand numbness and does not check her feet. She used to check blood sugar at home but not now.[facility] pregnant with Krystal Huaavid ~1 year ago. She does not want to start insulin because she fears her kids getting hold of the syringes and medication accidentally. She no longer breastfeeds and wants to try glyburide again. She also restartred exercising (walking) yesterday.   Diet recall yesterday: Breakfast: chicken biscuit Lunch: 1 slice cheese pizza and 2 breadsticks Dinner: Green beans, homemade chicken nuggets, macaroni and cheese (1 serving spoon full), broccoli with salt, pepper, and butter. Snack: 4 peanut butter crackers Beverages: water and 3 cans of diet pepsi  Diarrhea Present ~2 years, previously told due to depression but "I have diarrhea all the time, even when I'm feeling fine." Does report having baseline anxiety. Has some crampy abdominal pain and gas, but pain is relieved once she stools or passes gas. Denies fevers, chills, bloody stool, constipation, nausea, vomiting, or pattern with time of day or what she eats. Reports having some accidents in the car because she is having to have diarrhea so frequently and with such urgency. 12 times yesterday. Denies low back pain or leg  weakness/numbness/tingling other than hand and feet tingling mentioned above.  Depression Started on zoloft at prior visit, she reports it is greatly helping as she is more motivated to do things and has less "feeling down" spells.   Review of Systems - Per HPI.   PMH; No recent medication changes  Smoking status: Current every day smoker    Objective:  Physical Exam BP 129/84  Pulse 98  Resp 14  Ht 5\' 4"  (1.626 m)  Wt 276 lb (125.193 kg)  BMI 47.35 kg/m2  Breastfeeding? No GEN: NAD, pleasant Abd: s/nt/nd, NABS, obese PULM: Normal effort PSYCH: Mood and affect euthymic, rate and volume of speech normal    Assessment:     Krystal [name] is a 28 y.o. female with h/o DM and mild depression here for follow up of diabetes and with diarrhea.    Plan:     Diabetes follow up Recently worsened A1c from 6 to 9.2. Pt had been off glyburide since delivery of son 7 mo ago. She would like to restart this and is motivated to check blood sugar.  - change to metformin XR 24 hour tab 1000mg  daily due to diarrhea complaint. - Start glyburide 2.5mg  BID. - Reduce carbohydrates (guide provided, Dr Gerilyn PilgrimSykes visit recommended though pt is hesitant at this time as she feels it will not make a change). - Foot check next visit. - Needs eye exam. Referral placed so patient can have another office other than [facility]. - Continue exercise with goal of doing something daily. - Lipid panel today. - BG testing supplies ordered  and pt to check fasting and if she feels hyper/hypoglycemic.  Diarrhea Possible IBS, possible contribution of metformin. No fevers, chills, or vomiting making infective process unlikely. No unintentional weight loss or mucus in stool reported making IBD less likely though possible. - Switch to metformin extended release. - Imodium prn, do not use daily or if infective symptoms. - f/u in 1 month. If not improved, consider IBD and further workup. - Return if fevers,  chills, abdominal cramping, or other concerns.  Depression Improving on zoloft. - continue and f/u in 2 weeks  # Health Maintenance: Not discussed.  Follow-up: Follow up in 2 weeks for follow up of depression and 1 month for f/u of diarrhea if not improved.   Krystal Singleton, MD [facility]

## 2014-03-19 NOTE — Patient Instructions (Addendum)
Good to see you.  We are starting metformin extended release once daily tablet to help with diarrhea. You can also try immodium for diarrhea, but I would not use this every day. If you develop fevers, chills, abdominal cramping, or other concerns, seek immediate care. Otherwise, let us follow up on this in 1 month. Continue exercising daily to help with anxiety.  For diabetes, your goal is to minimize carbs. See guide below. Consider meeting with Dr Gerilyn PilgrimSykes. Let me know if you want to. We will check your feet at the next visit. Continue exercising with a goal of daily. Schedule an eye appointment. I am placing referral. If you do not hear anything in 1 week, call me.  We are checking labs today and I will call you if anything is not normal.  Best,  Krystal SingletonMaria T Tanisha Lutes, MD

## 2014-03-20 DIAGNOSIS — R197 Diarrhea, unspecified: Secondary | ICD-10-CM | POA: Insufficient documentation

## 2014-03-20 NOTE — Assessment & Plan Note (Signed)
Possible IBS, possible contribution of metformin. No fevers, chills, or vomiting making infective process unlikely. No unintentional weight loss or mucus in stool reported making IBD less likely though possible. - Switch to metformin extended release. - Imodium prn, do not use daily or if infective symptoms. - f/u in 1 month. If not improved, consider IBD and further workup. - Return if fevers, chills, abdominal cramping, or other concerns.

## 2014-03-20 NOTE — Assessment & Plan Note (Signed)
Improving on zoloft. - continue and f/u in 2 weeks

## 2014-03-20 NOTE — Assessment & Plan Note (Signed)
Recently worsened A1c from 6 to 9.2. Pt had been off glyburide since delivery of son 7 mo ago. She would like to restart this and is motivated to check blood sugar.  - change to metformin XR 24 hour tab 1000mg  daily due to diarrhea complaint. - Start glyburide 2.5mg  BID. - Reduce carbohydrates (guide provided, Dr Gerilyn PilgrimSykes visit recommended though pt is hesitant at this time as she feels it will not make a change). - Foot check next visit. - Needs eye exam. Referral placed so patient can have another [facility] other than [facility]. - Continue exercise with goal of doing something daily. - Lipid panel today. - BG testing supplies ordered and pt to check fasting and if she feels hyper/hypoglycemic.

## 2014-04-05 ENCOUNTER — Other Ambulatory Visit: Payer: Self-pay | Admitting: Family Medicine

## 2014-04-06 ENCOUNTER — Encounter: Payer: Self-pay | Admitting: Family Medicine

## 2014-04-09 ENCOUNTER — Telehealth: Payer: Self-pay | Admitting: Family Medicine

## 2014-04-09 NOTE — Telephone Encounter (Signed)
Pt wanting to switch back to old RX for Metformin due to the price of Metformin XR. Pt states this is way too expensive for her. Pharmacy sent over refill request on 8/30 which was denied. Pls advise.

## 2014-04-10 MED ORDER — METFORMIN HCL ER 500 MG PO TB24: 1000.0000 mg | ORAL_TABLET | Freq: Every day | ORAL | Status: DC

## 2014-04-10 NOTE — Telephone Encounter (Signed)
Pt informed that medication was switched back to Metformin XR; preferred medicaid list.  Clovis Pu, RN

## 2014-04-10 NOTE — Telephone Encounter (Signed)
Changed to metformin XR that should be on medicaid preferred list.

## 2014-05-12 ENCOUNTER — Ambulatory Visit (INDEPENDENT_AMBULATORY_CARE_PROVIDER_SITE_OTHER): Payer: Medicaid Other | Admitting: Family Medicine

## 2014-05-12 ENCOUNTER — Encounter: Payer: Self-pay | Admitting: Family Medicine

## 2014-05-12 VITALS — BP 110/70 | HR 84 | Temp 98.3°F | Wt 279.0 lb

## 2014-05-12 DIAGNOSIS — Z23 Encounter for immunization: Secondary | ICD-10-CM

## 2014-05-12 DIAGNOSIS — L659 Nonscarring hair loss, unspecified: Secondary | ICD-10-CM

## 2014-05-12 DIAGNOSIS — L700 Acne vulgaris: Secondary | ICD-10-CM

## 2014-05-12 DIAGNOSIS — Z30430 Encounter for insertion of intrauterine contraceptive device: Secondary | ICD-10-CM

## 2014-05-12 MED ORDER — KETOCONAZOLE 2 % EX SHAM: 1.0000 "application " | MEDICATED_SHAMPOO | CUTANEOUS | Status: DC

## 2014-05-12 NOTE — Patient Instructions (Signed)
This is unlikely related to your IUD. However, make an appointment to trim it if it is still uncomfortable.  For your hair loss, I am referring you to dermatology. In the meantime, wear your hair down more often. Also, use the ketoconazole shampoo TWICE weekly for 2-3 minutes each application. Try not to scratch too hard.  Best,  Leona SingletonMaria T Toben Acuna, MD

## 2014-05-14 DIAGNOSIS — L709 Acne, unspecified: Secondary | ICD-10-CM | POA: Insufficient documentation

## 2014-05-14 NOTE — Assessment & Plan Note (Signed)
Her acne may be related to the acanthosis on her neck and her obesity - consider PCOS.  - Did not discuss this but will need to at follow up.

## 2014-05-14 NOTE — Assessment & Plan Note (Signed)
Pt's husband can feel strings. Declined trimming today due to time constraints. - Come in for appt to trim IUD strings when convenient.

## 2014-05-14 NOTE — Assessment & Plan Note (Signed)
DDx of hair falling out is alopecia (traction or areata), telogen effluvium, and tinea capitis. Other causes include trichotillomania, which pt denies. Frong exclamation point hairs look like traction allopecia and are growing in. Back bald circular area looks like alopecia areata; uncertain if scarring or not. - Discussed that hair falling out is not a common IUD side effect and patient voiced she really likes the birth control it provides. Opted to keep in for now and explore other causes of hair loss.  - Derm referral placed. - Wear hair down more often. - Ketoconazole shampoo prescribed for trial. - Continue to manage stress.  - TSH normal 1 year ago.

## 2014-05-14 NOTE — Progress Notes (Signed)
Patient ID: Krystal [name], female   DOB: [birth year], 28 y.o.   MRN: [id number] Subjective:   CC: Hair falling out   HPI:   Krystal [name] is a 28 y.o. female presenting with complaint of hair falling out, and she thinks it is due to her IUD and wants to switch. She reports her aunt also has an IUD and her hair is falling out too. She has had 2 months of the hairs on the front of her head falling out and a friend noticed a round bald patch at the back of her head a while ago. She has also had acne worsening over 3-4 months. She is not pulling the hair out and although she wears it up in a ponytail most of the time, it is not very tight. She reports scalp itching too but denies flaking, new products, fevers or chills. Her other complaint is her husband can feel the IUD.    Review of Systems - Per HPI.  SH: Reports constant stressors of her 2 children    Objective:  Physical Exam BP 110/70  Pulse 84  Temp(Src) 98.3 F (36.8 C) (Oral)  Wt 279 lb (126.554 kg)  LMP 03/12/2014  Breastfeeding? No GEN: NAD, pleasant SKIN/HAIR: Anterior hairline with thick border of exclamation point hairs with no balding. Scalp with no scaling, erythema, or plaques. Back of hair with mostly normally-growing hair but one 2-3cm circular area with no hair.  Neck with acanthosis, mild    Assessment:     Krystal [name] is a 28 y.o. female with h/o diabetes type II here for hair falling out and acne.    Plan:     # See problem list and after visit summary for problem-specific plans.  # Health Maintenance: Not discussed  Follow-up: Follow up in when available for trimming IUD strings.  Precepted with Dr Randolm IdolFletke.   Leona SingletonMaria T Amyr Sluder, MD [facility]

## 2014-05-18 NOTE — Progress Notes (Signed)
Patient ID: Krystal [name], female   DOB: 1985-10-23, 28 y.o.   MRN: [id number] Reviewed note and agree with resident assessment/plan.   Donnella ShamKyle Manas Hickling MD

## 2014-05-22 ENCOUNTER — Other Ambulatory Visit: Payer: Self-pay

## 2014-06-08 ENCOUNTER — Encounter: Payer: Self-pay | Admitting: Family Medicine

## 2014-06-22 ENCOUNTER — Other Ambulatory Visit: Payer: Self-pay | Admitting: Family Medicine

## 2014-08-02 ENCOUNTER — Encounter (HOSPITAL_COMMUNITY): Payer: Self-pay

## 2014-08-02 ENCOUNTER — Emergency Department (HOSPITAL_COMMUNITY)
Admission: EM | Admit: 2014-08-02 | Discharge: 2014-08-02 | Disposition: A | Discharge status: Home / Self Care | Payer: Medicaid Other | Attending: Emergency Medicine | Admitting: Emergency Medicine

## 2014-08-02 ENCOUNTER — Emergency Department (HOSPITAL_COMMUNITY): Payer: Medicaid Other

## 2014-08-02 DIAGNOSIS — E669 Obesity, unspecified: Secondary | ICD-10-CM | POA: Diagnosis not present

## 2014-08-02 DIAGNOSIS — Z79899 Other long term (current) drug therapy: Secondary | ICD-10-CM | POA: Insufficient documentation

## 2014-08-02 DIAGNOSIS — R112 Nausea with vomiting, unspecified: Secondary | ICD-10-CM | POA: Insufficient documentation

## 2014-08-02 DIAGNOSIS — E119 Type 2 diabetes mellitus without complications: Secondary | ICD-10-CM | POA: Insufficient documentation

## 2014-08-02 DIAGNOSIS — R1084 Generalized abdominal pain: Secondary | ICD-10-CM | POA: Diagnosis present

## 2014-08-02 DIAGNOSIS — R197 Diarrhea, unspecified: Secondary | ICD-10-CM | POA: Insufficient documentation

## 2014-08-02 DIAGNOSIS — Z3202 Encounter for pregnancy test, result negative: Secondary | ICD-10-CM | POA: Insufficient documentation

## 2014-08-02 DIAGNOSIS — Z720 Tobacco use: Secondary | ICD-10-CM | POA: Insufficient documentation

## 2014-08-02 LAB — CBC WITH DIFFERENTIAL/PLATELET
BASOS PCT: 0 % (ref 0–1)
Basophils Absolute: 0 10*3/uL (ref 0.0–0.1)
EOS ABS: 0.1 10*3/uL (ref 0.0–0.7)
Eosinophils Relative: 1 % (ref 0–5)
HEMATOCRIT: 41.6 % (ref 36.0–46.0)
HEMOGLOBIN: 13.9 g/dL (ref 12.0–15.0)
Lymphocytes Relative: 17 % (ref 12–46)
Lymphs Abs: 1.7 10*3/uL (ref 0.7–4.0)
MCH: 28.4 pg (ref 26.0–34.0)
MCHC: 33.4 g/dL (ref 30.0–36.0)
MCV: 85.1 fL (ref 78.0–100.0)
MONOS PCT: 5 % (ref 3–12)
Monocytes Absolute: 0.5 10*3/uL (ref 0.1–1.0)
Neutro Abs: 7.9 10*3/uL — ABNORMAL HIGH (ref 1.7–7.7)
Neutrophils Relative %: 77 % (ref 43–77)
Platelets: 268 10*3/uL (ref 150–400)
RBC: 4.89 MIL/uL (ref 3.87–5.11)
RDW: 13 % (ref 11.5–15.5)
WBC: 10.1 10*3/uL (ref 4.0–10.5)

## 2014-08-02 LAB — URINALYSIS, ROUTINE W REFLEX MICROSCOPIC
GLUCOSE, UA: 250 mg/dL — AB
HGB URINE DIPSTICK: NEGATIVE
KETONES UR: 15 mg/dL — AB
LEUKOCYTES UA: NEGATIVE
Nitrite: NEGATIVE
Protein, ur: NEGATIVE mg/dL
Specific Gravity, Urine: 1.025 (ref 1.005–1.030)
Urobilinogen, UA: 0.2 mg/dL (ref 0.0–1.0)
pH: 5 (ref 5.0–8.0)

## 2014-08-02 LAB — LIPASE, BLOOD: Lipase: 22 U/L (ref 11–59)

## 2014-08-02 LAB — PREGNANCY, URINE: Preg Test, Ur: NEGATIVE

## 2014-08-02 LAB — CBG MONITORING, ED: GLUCOSE-CAPILLARY: 237 mg/dL — AB (ref 70–99)

## 2014-08-02 MED ORDER — SODIUM CHLORIDE 0.9 % IV BOLUS (SEPSIS)
1000.0000 mL | Freq: Once | INTRAVENOUS | Status: AC
  Administered 2014-08-02: 1000 mL via INTRAVENOUS

## 2014-08-02 MED ORDER — DICYCLOMINE HCL 10 MG/ML IM SOLN
20.0000 mg | Freq: Once | INTRAMUSCULAR | Status: AC
  Administered 2014-08-02: 20 mg via INTRAMUSCULAR
  Filled 2014-08-02: qty 2

## 2014-08-02 MED ORDER — ONDANSETRON 4 MG PO TBDP
8.0000 mg | ORAL_TABLET | Freq: Once | ORAL | Status: AC
  Administered 2014-08-02: 8 mg via ORAL
  Filled 2014-08-02: qty 2

## 2014-08-02 MED ORDER — FAMOTIDINE IN NACL 20-0.9 MG/50ML-% IV SOLN
20.0000 mg | Freq: Once | INTRAVENOUS | Status: AC
  Administered 2014-08-02: 20 mg via INTRAVENOUS
  Filled 2014-08-02: qty 50

## 2014-08-02 MED ORDER — ONDANSETRON 4 MG PO TBDP: 4.0000 mg | ORAL_TABLET | Freq: Three times a day (TID) | ORAL | Status: DC | PRN

## 2014-08-02 MED ORDER — DICYCLOMINE HCL 20 MG PO TABS: 20.0000 mg | ORAL_TABLET | Freq: Four times a day (QID) | ORAL | Status: DC | PRN

## 2014-08-02 MED ORDER — ONDANSETRON HCL 4 MG/2ML IJ SOLN
4.0000 mg | Freq: Once | INTRAMUSCULAR | Status: AC
  Administered 2014-08-02: 4 mg via INTRAVENOUS
  Filled 2014-08-02: qty 2

## 2014-08-02 NOTE — ED Notes (Signed)
Pt placed on monitor upon return to room from restroom. Pt able to ambulate to restroom with no assistance needed. Pt remains monitored by  5 lead, blood pressure and pulse ox. Pts family remains at bedside.

## 2014-08-02 NOTE — ED Notes (Signed)
Main lab will add lipase to CMP

## 2014-08-02 NOTE — Discharge Instructions (Signed)
°Emergency Department Resource Guide °1) Find a Doctor and Pay Out of Pocket °Although you won't have to find out who is covered by your insurance plan, it is a good idea to ask around and get recommendations. You will then need to call the office and see if the doctor you have chosen will accept you as a new patient and what types of options they offer for patients who are self-pay. Some doctors offer discounts or will set up payment plans for their patients who do not have insurance, but you will need to ask so you aren't surprised when you get to your appointment. ° °2) Contact Your Local Health Department °Not all health departments have doctors that can see patients for sick visits, but many do, so it is worth a call to see if yours does. If you don't know where your local health department is, you can check in your phone book. The CDC also has a tool to help you locate your state's health department, and many state websites also have listings of all of their local health departments. ° °3) Find a Walk-in Clinic °If your illness is not likely to be very severe or complicated, you may want to try a walk in clinic. These are popping up all over the country in pharmacies, drugstores, and shopping centers. They're usually staffed by nurse practitioners or physician assistants that have been trained to treat common illnesses and complaints. They're usually fairly quick and inexpensive. However, if you have serious medical issues or chronic medical problems, these are probably not your best option. ° °No Primary Care Doctor: °- Call [facility] at  832-8000 - they can help you locate a primary care doctor that  accepts your insurance, provides certain services, etc. °- Physician Referral Service- 1-800-533-3463 ° °Chronic Pain Problems: °Organization         Address  Phone   Notes  °[facility]  (336) 297-2271 Patients need to be referred by their primary care doctor.  ° °Medication  Assistance: °Organization         Address  Phone   Notes  °[place] [facility] [address] °[place], [place] [address] (336) 641-8030 --Must be a resident of [place] °-- Must have NO insurance coverage whatsoever (no Medicaid/ Medicare, etc.) °-- The pt. MUST have a primary care doctor that directs their care regularly and follows them in the community °  °[facility]  (866) 331-1348   °[employer]  (888) 892-1162   ° °Agencies that provide inexpensive medical care: °Organization         Address  Phone   Notes  °[facility]  (336) 832-8035   °[facility]    (336) 832-7272   °[facility] [address] °[place], [place] [address] (336) 832-4777   °[facility] [address], °[place] (336) 271-4999   °[facility]    (336) 373-0678   °[facility]    (336) 272-1050   °[facility] ° [address], [place] Phone:  (336) 832-4444, Fax:  (336) 832-4440 Hours of Operation:  9 am - 6 pm, M-F.  Also accepts Medicaid/Medicare and self-pay.  °[facility] ° [address], [place] Phone: (336) [phone], Fax: (336) [phone]. Hours of Operation:  8:30 am - 5:30 pm, M-F.  Also accepts Medicaid and self-pay.  °[facility] [place] [address], [place] Phone: (336) 878-6027   °[facility] [address], [place], [place] (336)723-1848, Ext. 123 Mondays & Thursdays: 7-9 AM.  First 15 patients are seen on a first come, first serve basis. °  ° °Medicaid-accepting [place] Providers: ° °Organization         Address  Phone   Notes  °[facility] [address], [place] (336) 641-2100 Also accepts self-pay patients.  °[facility] [address], [place] ° (336) 856-9996   °[facility] [address], [place]  (336) 288-8857   °[facility] [address] [place] [address], [place] (336) 299-7000   °Veita Bland [address], [place]  ° (336) 373-1557 Only accepts [place] Access Medicaid patients after they have their name applied to their card.  ° °Self-Pay (no insurance) in [place]: ° °Organization         Address  Phone   Notes  °Sickle Cell Patients, [facility] [address], [place] (336) 832-1970   °[facility] [address], [place] (336) 832-4400   °[facility] ° [address], [place] (336) 992-4800   °[facility]/Dr. Osei-Bonsu ° [address] [place] [address], [place] or [address], [place] (336) [phone] Phone number for both [place] and [place] locations is the same.  °[facility] [address], [place] (336) 299-0000   °[facility] [address] [place] [address], [place] or [address] (336) 852-7530 °(336) 878-2260   °[facility] [address], [place] (336) 350-1642, phone; (336) 294-5005, fax Sees patients 1st and 3rd Saturday of every month.  Must not qualify for public or private insurance (i.e. Medicaid, Medicare, [place] Health Choice, Veterans' Benefits) • Household income should be no more than 200% of the poverty level •The clinic cannot treat you if you are pregnant or think you are pregnant • Sexually transmitted diseases are not treated at the clinic.  ° ° °Dental Care: °Organization         Address  Phone  Notes  °[place] [facility] [address], [place] (336) 641-6152 Accepts children up to age 21 who are enrolled in Medicaid or [place] Health Choice; pregnant women with a Medicaid card; and children who have applied for Medicaid or [place] Health Choice, but were declined, whose parents can pay a reduced fee at time of service.  °[place]  [facility] [place]  [address], [place] (336) 641-7733 Accepts children up to age 21 who are enrolled in Medicaid or [place] Health Choice; pregnant women with a Medicaid card; and children who have applied for Medicaid or [place] Health Choice, but were declined, whose parents can pay a reduced fee at time of service.  °[facility] ° [address], [place] (336) 641-4533 Patients are seen by appointment only. Walk-ins are not accepted. [facility] will see patients 18 years of age and older. °Monday - Tuesday (8am-5pm) °Most Wednesdays (8:30-5pm) °$30 per visit, cash only  °[facility] ° [address], [place] (336) 641-4533 Patients are seen by appointment only. Walk-ins are not accepted. [facility] will see patients 18 years of age and older. °One   Wednesday Evening (Monthly: Volunteer Based).  $30 per visit, cash only  °[facility]  (919) 537-3737 for adults; Children under age 4, call Graduate Pediatric Dentistry at ([phone]) [phone]. Children aged 4-14, please call ([phone]) [phone] to request a pediatric application. ° Dental services are provided in all areas of dental care including fillings, crowns and bridges, complete and partial dentures, implants, gum treatment, root canals, and extractions. Preventive care is also provided. Treatment is provided to both adults and children. °Patients are selected via a lottery and there is often a waiting list. °  °[facility] [address], °[place] ° (336) 763-8833 [email or web address] °  °[facility] [address], [place], [place] (336)723-1848, Ext. 123 Second and Fourth Thursday of each month, opens at 6:30 AM; Clinic ends at 9 AM.  Patients are seen on a first-come first-served basis, and a limited number are seen during each clinic.  ° °[facility] ° [address], [place], [place] (336) 723-7904    Eligibility Requirements °You must have lived in [place], [place], or [place] counties for at least the last three months. °  You cannot be eligible for state or federal sponsored healthcare insurance, including Veterans Administration, Medicaid, or Medicare. °  You generally cannot be eligible for healthcare insurance through your employer.  °  How to apply: °Eligibility screenings are held every Tuesday and Wednesday afternoon from 1:00 pm until 4:00 pm. You do not need an appointment for the interview!  °[facility] [address], [place], [place] 336-631-2330   °[facility]  336-342-8273   °[facility]  336-703-3100   °[facility]  336-570-6415   ° °Behavioral Health Resources in the Community: °Intensive Outpatient Programs °Organization         Address  Phone  Notes  °[facility] [address], [place], [place] 336-878-6098   °[facility] [address], [place], [place] 336-832-9800   °[facility] [address], [place], [place] ° 336-882-2125   °[facility] [address],  °[place], [place] 1-800-853-5163 or 336-641-4981   °Substance Abuse Resources °Organization         Address  Phone  Notes  °[facility]  336-882-2125   °[facility]  336-784-9470   °[facility]  336-285-9073   °[facility]  336-845-3988   °Residential & Outpatient Substance Abuse Program  1-800-659-3381   °Psychological Services °Organization         Address  Phone  Notes  °[facility]  336- 832-9600   °[facility]  336- 378-7881   °[facility] [address], [place] 1-800-853-5163 or 336-641-4981   ° °Mobile Crisis Teams °Organization         Address  Phone  Notes  °[facility], Mobile Crisis Care Unit  1-877-626-1772   °Assertive °[facility] ° [address]  [place], [place] 336-834-9664   °Sharon DeEsch [address] °[place] 336-554-5454   ° °Self-Help/Support Groups °Organization         Address  Phone             Notes  °[facility] of  - variety of support groups  336- 373-1402 Call for more information  °Narcotics Anonymous (NA), [facility] [address], °[place]  2 meetings at this location  ° °  Residential Treatment Programs Organization         Address  Phone  Notes  [facility] [address],    [place]  [phone]   [facility]  [address], [place] [id number], [place], [place] [phone]   [facility] [address], [place] [phone] Admissions: 8am-3pm M-F  [facility] [address]    [place], [place] [phone]   [facility] [address], [place], [place] [phone]   [facility] [address]  [place], [place] [phone]   [facility] Intensive Outpatient [address], [place], [place] [phone]   [facility] ([facility] [address]  [place], [place] [phone] or [phone]   [facility] [address] [place], [place] [phone] Accepts Medicaid  [facility] [address]  [place] [phone] Substance Abuse/Addiction Treatment   [facility]         Address  Phone  Notes  [facility]  [phone](888) [phone])[phone]   Angie FavaJulie Brannon, PhD [address], [address] [place], [place]   [phone](336) [phone] or [phone](336) [phone]   [facility]   [address] [place], [place] [phone](336) [phone]   [facility] [address], [place], [place] [phone](336) [phone] Insurance/Medicaid/sponsorship through [facility] [address]                                    [place], [place] [phone](336) [phone] Therapy/tele-psych/case    [facility] [address]   [place], [place] [phone](336) [phone]    Dr. Lolly MustacheArfeen  [phone]) [phone]   [facility] [place]  [employer] [facility] 1) [address], [place] 2) [address], [place] 3)  [address], [place] [phone]) [phone](336) [phone]  [phone](336) [phone]   [facility] Child [facility] Hot[facility] [phone](336) [phone] or [phone](336) [phone] (After Hours)      Take the prescriptions as directed.  Increase your fluid intake (ie:  Gatoraide) for the next few days, as discussed.  Eat a bland diet and advance to your regular diet slowly as you can tolerate it.   Avoid full strength juices, as well as milk and milk products until your diarrhea has resolved.   Call your regular medical doctor Monday to schedule a follow up appointment in the next 2 days.  Return to the Emergency Department immediately if not improving (or even worsening) despite taking the medicines as prescribed, any black or bloody stool or vomit, if you develop a fever over "101," or for any other concerns.

## 2014-08-02 NOTE — ED Notes (Signed)
Pt placed on monitor upon arrival to room. Pt monitored by blood pressure, pulse ox, and 5 lead. pts family remains at bedside. Pt provided with glass of water to complete fluid challenge.

## 2014-08-02 NOTE — ED Provider Notes (Signed)
CSN: [id number]     Arrival date & time 08/02/14  1829 History   First MD Initiated Contact with Patient 08/02/14 1106     Chief Complaint  Patient presents with  . Abdominal Pain  . Diarrhea      HPI Pt was seen at 1130. Per pt, c/o gradual onset and persistence of multiple intermittent episodes of N/V/D that began 2 days ago. Describes the stools as "watery." Has been associated with generalized "cramping" abd, esp in her upper abd. Denies CP/SOB, no back pain, no fevers, no black or blood in stools or emesis.     Past Medical History  Diagnosis Date  . Diabetes mellitus   . Obesity   . Breast feeding status of mother    Past Surgical History  Procedure Laterality Date  . Mouth surgery    . Cesarean section N/A 08/18/2013    Procedure: CESAREAN SECTION;  Surgeon: Donnamae Jude, MD;  Location: [facility];  Service: Obstetrics;  Laterality: N/A;   Family History  Problem Relation Age of Onset  . Asthma Mother   . Diabetes Mother   . Alcohol abuse Mother    History  Substance Use Topics  . Smoking status: Current Every Day Smoker -- 0.25 packs/day for 6 years    Types: Cigarettes  . Smokeless tobacco: Never Used     Comment: trying to quitt  . Alcohol Use: No   OB History    Gravida Para Term Preterm AB TAB SAB Ectopic Multiple Living   _0 Review of Systems ROS: Statement: All systems negative except as marked or noted in the HPI; Constitutional: Negative for fever and chills. ; ; Eyes: Negative for eye pain, redness and discharge. ; ; ENMT: Negative for ear pain, hoarseness, nasal congestion, sinus pressure and sore throat. ; ; Cardiovascular: Negative for chest pain, palpitations, diaphoresis, dyspnea and peripheral edema. ; ; Respiratory: Negative for cough, wheezing and stridor. ; ; Gastrointestinal: +N/V/D, abd pain. Negative for blood in stool, hematemesis, jaundice and rectal bleeding. . ; ; Genitourinary: Negative for dysuria, flank pain and hematuria.  ; ; Musculoskeletal: Negative for back pain and neck pain. Negative for swelling and trauma.; ; Skin: Negative for pruritus, rash, abrasions, blisters, bruising and skin lesion.; ; Neuro: Negative for headache, lightheadedness and neck stiffness. Negative for weakness, altered level of consciousness , altered mental status, extremity weakness, paresthesias, involuntary movement, seizure and syncope.     Allergies  Review of patient's allergies indicates no known allergies.  Home Medications   Prior to Admission medications   Medication Sig Start Date End Date Taking? Authorizing Provider  ACCU-CHEK FASTCLIX LANCETS MISC CHECK BLOOD SUGAR ONCE DAILY EVERY MORNING and if signs of high or low sugar 03/19/14  Yes Hilton Sinclair, MD  Blood Glucose Monitoring Suppl (BLOOD GLUCOSE MONITOR SYSTEM) W/DEVICE KIT Use with Accu-check lancets and strips to test blood sugar daily (fasting and if any symptoms of high or low sugar). 03/19/14  Yes Hilton Sinclair, MD  glucose blood (ACCU-CHEK AVIVA) test strip Use as instructed 03/19/14  Yes Hilton Sinclair, MD  glyBURIDE (DIABETA) 2.5 MG tablet TAKE 1 TABLET BY MOUTH TWICE DAILY, WITH MEALS 06/23/14  Yes Hilton Sinclair, MD  glyBURIDE (DIABETA) 2.5 MG tablet Take 2.5 mg by mouth 2 (two) times daily.   Yes Historical Provider, MD  ibuprofen (ADVIL,MOTRIN) 400 MG tablet Take 1 tablet (400 mg total)  by mouth every 6 (six) hours as needed for mild pain. 10/30/13  Yes Hilton Sinclair, MD  metFORMIN (GLUCOPHAGE-XR) 500 MG 24 hr tablet Take 2 tablets (1,000 mg total) by mouth daily with breakfast. Patient taking differently: Take 1,000 mg by mouth 2 (two) times daily.  04/10/14  Yes Leone Haven, MD  levonorgestrel (MIRENA) 20 MCG/24HR IUD 1 each by Intrauterine route once.    Historical Provider, MD  sertraline (ZOLOFT) 100 MG tablet Take 0.5 tablets (50 mg total) by mouth daily. After 1 week increase to 151m daily. 02/24/14   MHilton Sinclair MD   BP 123/64 mmHg  Pulse 89  Temp(Src) 97.6 F (36.4 C) (Oral)  Resp 15  SpO2 98%   12:16 Orthostatic Vital Signs CV  Orthostatic Lying  - BP- Lying: 83/34 mmHg ; Pulse- Lying: 73  Orthostatic Sitting - BP- Sitting: 106/76 mmHg ; Pulse- Sitting: 77  Orthostatic Standing at 0 minutes - BP- Standing at 0 minutes: 111/82 mmHg ; Pulse- Standing at 0 minutes: 89    Physical Exam  1135: Physical examination:  Nursing notes reviewed; Vital signs and O2 SAT reviewed;  Constitutional: Well developed, Well nourished, Well hydrated, In no acute distress; Head:  Normocephalic, atraumatic; Eyes: EOMI, PERRL, No scleral icterus; ENMT: Mouth and pharynx normal, Mucous membranes moist; Neck: Supple, Full range of motion, No lymphadenopathy; Cardiovascular: Regular rate and rhythm, No murmur, rub, or gallop; Respiratory: Breath sounds clear & equal bilaterally, No rales, rhonchi, wheezes.  Speaking full sentences with ease, Normal respiratory effort/excursion; Chest: Nontender, Movement normal; Abdomen: Soft, +mild LUQ, mid-epigastric tenderness to palp. No rebound or guarding. Nondistended, Normal bowel sounds; Genitourinary: No CVA tenderness; Extremities: Pulses normal, No tenderness, No edema, No calf edema or asymmetry.; Neuro: AA&Ox3, Major CN grossly intact.  Speech clear. No gross focal motor or sensory deficits in extremities.; Skin: Color normal, Warm, Dry.   ED Course  Procedures     MDM  MDM Reviewed: previous chart, nursing note and vitals Reviewed previous: labs Interpretation: labs and x-ray      Results for orders placed or performed during the hospital encounter of 08/02/14  CBC with Differential  Result Value Ref Range   WBC 10.1 4.0 - 10.5 K/uL   RBC 4.89 3.87 - 5.11 MIL/uL   Hemoglobin 13.9 12.0 - 15.0 g/dL   HCT 41.6 36.0 - 46.0 %   MCV 85.1 78.0 - 100.0 fL   MCH 28.4 26.0 - 34.0 pg   MCHC 33.4 30.0 - 36.0 g/dL   RDW 13.0 11.5 - 15.5 %   Platelets 268  150 - 400 K/uL   Neutrophils Relative % 77 43 - 77 %   Neutro Abs 7.9 (H) 1.7 - 7.7 K/uL   Lymphocytes Relative 17 12 - 46 %   Lymphs Abs 1.7 0.7 - 4.0 K/uL   Monocytes Relative 5 3 - 12 %   Monocytes Absolute 0.5 0.1 - 1.0 K/uL   Eosinophils Relative 1 0 - 5 %   Eosinophils Absolute 0.1 0.0 - 0.7 K/uL   Basophils Relative 0 0 - 1 %   Basophils Absolute 0.0 0.0 - 0.1 K/uL  Pregnancy, urine  Result Value Ref Range   Preg Test, Ur NEGATIVE NEGATIVE  Urinalysis, Routine w reflex microscopic  Result Value Ref Range   Color, Urine YELLOW YELLOW   APPearance CLEAR CLEAR   Specific Gravity, Urine 1.025 1.005 - 1.030   pH 5.0 5.0 - 8.0   Glucose, UA  250 (A) NEGATIVE mg/dL   Hgb urine dipstick NEGATIVE NEGATIVE   Bilirubin Urine SMALL (A) NEGATIVE   Ketones, ur 15 (A) NEGATIVE mg/dL   Protein, ur NEGATIVE NEGATIVE mg/dL   Urobilinogen, UA 0.2 0.0 - 1.0 mg/dL   Nitrite NEGATIVE NEGATIVE   Leukocytes, UA NEGATIVE NEGATIVE  Lipase, blood  Result Value Ref Range   Lipase 22 11 - 59 U/L  POC CBG, ED  Result Value Ref Range   Glucose-Capillary 237 (H) 70 - 99 mg/dL   Dg Abd Acute W/chest 08/02/2014   CLINICAL DATA:  Acute abdominal pain with nausea, vomiting and diarrhea since earlier today  EXAM: ACUTE ABDOMEN SERIES (ABDOMEN 2 VIEW & CHEST 1 VIEW)  COMPARISON:  None.  FINDINGS: There is no evidence of dilated bowel loops or free intraperitoneal air. No radiopaque calculi or other significant radiographic abnormality is seen. Heart size and mediastinal contours are within normal limits. Both lungs are clear. IUD present within the pelvis.  IMPRESSION: Negative abdominal radiographs.  No acute cardiopulmonary disease.   Electronically Signed   By: Daryll Brod M.D.   On: 08/02/2014 13:34    1515:  Pt has tol PO well while in the ED without N/V.  No stooling while in the ED.  Abd benign, VSS after IVF. Feels better and wants to go home now. Dx and testing d/w pt and family.  Questions  answered.  Verb understanding, agreeable to d/c home with outpt f/u.    Francine Graven, DO 08/04/14 1459

## 2014-08-02 NOTE — ED Notes (Signed)
Pt placed into gown and on monitor upon arrival to room. Pt monitored by blood pressure and pulse ox. pts family remains at bedside.

## 2014-08-02 NOTE — ED Notes (Signed)
Pt was able to complete orthostatic vital signs with no assistance needed.  

## 2014-08-02 NOTE — ED Notes (Signed)
Pt here for abd pain, diarrhea and vomiting for the past 2 days. Unable to hold liquids down. Only vomited x 1 yesterday and this morning.

## 2014-08-02 NOTE — ED Notes (Signed)
Pt remains monitored by blood pressure, pluse ox, and 5 lead. pts family remains at bedside.

## 2014-08-25 ENCOUNTER — Telehealth: Payer: Self-pay | Admitting: Family Medicine

## 2014-08-25 MED ORDER — METFORMIN HCL ER 500 MG PO TB24: 1000.0000 mg | ORAL_TABLET | Freq: Every day | ORAL | Status: DC

## 2014-08-25 MED ORDER — GLYBURIDE 2.5 MG PO TABS: 2.5000 mg | ORAL_TABLET | Freq: Two times a day (BID) | ORAL | Status: DC

## 2014-08-25 NOTE — Telephone Encounter (Signed)
Patient present for child's well child visit, requesting refill on glyburide and metformin. Will refill for 3 months and pt to f/u in that time. She voiced understanding.  Krystal SingletonMaria T Jayni Prescher, MD

## 2014-10-08 ENCOUNTER — Encounter: Payer: Self-pay | Admitting: Family Medicine

## 2014-10-08 ENCOUNTER — Ambulatory Visit (INDEPENDENT_AMBULATORY_CARE_PROVIDER_SITE_OTHER): Payer: Medicaid Other | Admitting: Family Medicine

## 2014-10-08 VITALS — BP 121/77 | HR 98 | Temp 98.0°F | Ht 64.0 in | Wt 285.0 lb

## 2014-10-08 DIAGNOSIS — M25562 Pain in left knee: Secondary | ICD-10-CM

## 2014-10-08 DIAGNOSIS — M25561 Pain in right knee: Secondary | ICD-10-CM

## 2014-10-08 NOTE — Progress Notes (Signed)
Patient ID: Krystal [name], female   DOB: [birth year], 29 y.o.   MRN: [id number] Subjective:   CC: Bilateral knee pain  HPI:   Patient is presenting for chronic bilateral knee pain that has worsened over the past month. She has been dealing with this since she was 18 and fell, creating a "popping" in her left knee and swelling. She then subsequently reinjured it 3 times with twisting and popping and swelling. She recently injured the right knee as well. She did physical therapy about 5 years ago which she states did not improve her pain. Currently left knee hurts worse than right, and she says both frequently give out. She fell this past weekend due to that. She has also had some swelling intermittently. Tylenol and ibuprofen alternating take the edge off the pain, otherwise it is severe. Pain is mostly patellar and bilateral joint lines. She denies fevers or chills. It is waking her up from sleep.  Review of Systems - Per HPI.   PMH:  obesity, type 2 diabetes, tobacco dependence, mild depression, migraines, GERD, hidradenitis Suppurativa    Objective:  Physical Exam BP 121/77 mmHg  Pulse 98  Temp(Src) 98 F (36.7 C) (Oral)  Ht 5\' 4"  (1.626 m)  Wt 285 lb (129.275 kg)  BMI 48.90 kg/m2 GEN: NAD Extremities: Bilateral knees obese with no obvious effusion or erythema Bilateral tenderness inferior patella and bilateral joint lines, with right knee mostly with lateral joint line tenderness Bilateral patellar grind No laxity on Lachman, varus and valgus strain bilaterally Range of motion 0 to 130 degrees left, 0-135 right with pain on flexion bilaterally     Assessment:     Krystal [name] is a 29 y.o. female here for bilateral knee pain.    Plan:     # See problem list and after visit summary for problem-specific plans.   Follow-up: Follow up in 1-2 months for f/u knee pain.   Krystal SingletonMaria T Ayleah Hofmeister, MD [facility]

## 2014-10-08 NOTE — Patient Instructions (Addendum)
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 [facility]. Make sure they do a standing view as well. Work on the quadriceps strengthening exercises that I showed you, doing them 1-2 times every day. Follow-up with us in 2-4 weeks to see how your knee is doing. For pain, you should use ice or heat, whichever makes it feel better. You can continue alternating Tylenol and ibuprofen. Be sure to take ibuprofen with food and try to limit to 1200 mg per day. Stop if you starts having any stomach irritation.

## 2014-10-08 NOTE — Assessment & Plan Note (Addendum)
Differential diagnosis includes meniscal tear after multiple injuries when she was younger. 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 Obesity contributing. -Bilateral standing knee x-rays -Discussed quad strengthening exercises (isometric exercises with knee extension, leg raises)  - continue Tylenol and ibuprofen, discontinuing ibuprofen if any GI irritation occurs. -Heat, ice when necessary -Follow-up in one to 1 month. If no improvement, can consider knee injection, MRI versus referral to sports medicine / orthopedics.

## 2014-11-01 ENCOUNTER — Encounter (HOSPITAL_COMMUNITY): Payer: Self-pay | Admitting: Emergency Medicine

## 2014-11-01 ENCOUNTER — Emergency Department (HOSPITAL_COMMUNITY)
Admission: EM | Admit: 2014-11-01 | Discharge: 2014-11-01 | Disposition: A | Discharge status: Home / Self Care | Payer: Medicaid Other | Attending: Emergency Medicine | Admitting: Emergency Medicine

## 2014-11-01 ENCOUNTER — Emergency Department (HOSPITAL_COMMUNITY): Payer: Medicaid Other

## 2014-11-01 DIAGNOSIS — S8392XA Sprain of unspecified site of left knee, initial encounter: Secondary | ICD-10-CM | POA: Diagnosis not present

## 2014-11-01 DIAGNOSIS — Z793 Long term (current) use of hormonal contraceptives: Secondary | ICD-10-CM | POA: Insufficient documentation

## 2014-11-01 DIAGNOSIS — S4991XA Unspecified injury of right shoulder and upper arm, initial encounter: Secondary | ICD-10-CM | POA: Diagnosis not present

## 2014-11-01 DIAGNOSIS — Y9389 Activity, other specified: Secondary | ICD-10-CM | POA: Diagnosis not present

## 2014-11-01 DIAGNOSIS — E119 Type 2 diabetes mellitus without complications: Secondary | ICD-10-CM | POA: Diagnosis not present

## 2014-11-01 DIAGNOSIS — W1830XA Fall on same level, unspecified, initial encounter: Secondary | ICD-10-CM | POA: Diagnosis not present

## 2014-11-01 DIAGNOSIS — M25511 Pain in right shoulder: Secondary | ICD-10-CM

## 2014-11-01 DIAGNOSIS — Z720 Tobacco use: Secondary | ICD-10-CM | POA: Insufficient documentation

## 2014-11-01 DIAGNOSIS — E669 Obesity, unspecified: Secondary | ICD-10-CM | POA: Insufficient documentation

## 2014-11-01 DIAGNOSIS — Y929 Unspecified place or not applicable: Secondary | ICD-10-CM | POA: Diagnosis not present

## 2014-11-01 DIAGNOSIS — Y998 Other external cause status: Secondary | ICD-10-CM | POA: Insufficient documentation

## 2014-11-01 DIAGNOSIS — Z79899 Other long term (current) drug therapy: Secondary | ICD-10-CM | POA: Diagnosis not present

## 2014-11-01 DIAGNOSIS — S8991XA Unspecified injury of right lower leg, initial encounter: Secondary | ICD-10-CM | POA: Diagnosis present

## 2014-11-01 MED ORDER — IBUPROFEN 600 MG PO TABS: 600.0000 mg | ORAL_TABLET | Freq: Four times a day (QID) | ORAL | Status: DC | PRN

## 2014-11-01 MED ORDER — TRAMADOL HCL 50 MG PO TABS: 50.0000 mg | ORAL_TABLET | Freq: Four times a day (QID) | ORAL | Status: DC | PRN

## 2014-11-01 NOTE — Discharge Instructions (Signed)
Ibuprofen for pain. Tramadol for severe pain. Rest. Ice. Elevate. Follow up with primary care doctor.   Knee Pain The knee is the complex joint between your thigh and your lower leg. It is made up of bones, tendons, ligaments, and cartilage. The bones that make up the knee are:  The femur in the thigh.  The tibia and fibula in the lower leg.  The patella or kneecap riding in the groove on the lower femur. CAUSES  Knee pain is a common complaint with many causes. A few of these causes are:  Injury, such as:  A ruptured ligament or tendon injury.  Torn cartilage.  Medical conditions, such as:  Gout  Arthritis  Infections  Overuse, over training, or overdoing a physical activity. Knee pain can be minor or severe. Knee pain can accompany debilitating injury. Minor knee problems often respond well to self-care measures or get well on their own. More serious injuries may need medical intervention or even surgery. SYMPTOMS The knee is complex. Symptoms of knee problems can vary widely. Some of the problems are:  Pain with movement and weight bearing.  Swelling and tenderness.  Buckling of the knee.  Inability to straighten or extend your knee.  Your knee locks and you cannot straighten it.  Warmth and redness with pain and fever.  Deformity or dislocation of the kneecap. DIAGNOSIS  Determining what is wrong may be very straight forward such as when there is an injury. It can also be challenging because of the complexity of the knee. Tests to make a diagnosis may include:  Your caregiver taking a history and doing a physical exam.  Routine X-rays can be used to rule out other problems. X-rays will not reveal a cartilage tear. Some injuries of the knee can be diagnosed by:  Arthroscopy a surgical technique by which a small video camera is inserted through tiny incisions on the sides of the knee. This procedure is used to examine and repair internal knee joint problems. Tiny  instruments can be used during arthroscopy to repair the torn knee cartilage (meniscus).  Arthrography is a radiology technique. A contrast liquid is directly injected into the knee joint. Internal structures of the knee joint then become visible on X-ray film.  An MRI scan is a non X-ray radiology procedure in which magnetic fields and a computer produce two- or three-dimensional images of the inside of the knee. Cartilage tears are often visible using an MRI scanner. MRI scans have largely replaced arthrography in diagnosing cartilage tears of the knee.  Blood work.  Examination of the fluid that helps to lubricate the knee joint (synovial fluid). This is done by taking a sample out using a needle and a syringe. TREATMENT The treatment of knee problems depends on the cause. Some of these treatments are:  Depending on the injury, proper casting, splinting, surgery, or physical therapy care will be needed.  Give yourself adequate recovery time. Do not overuse your joints. If you begin to get sore during workout routines, back off. Slow down or do fewer repetitions.  For repetitive activities such as cycling or running, maintain your strength and nutrition.  Alternate muscle groups. For example, if you are a weight lifter, work the upper body on one day and the lower body the next.  Either tight or weak muscles do not give the proper support for your knee. Tight or weak muscles do not absorb the stress placed on the knee joint. Keep the muscles surrounding the knee strong.  Take care of mechanical problems.  If you have flat feet, orthotics or special shoes may help. See your caregiver if you need help.  Arch supports, sometimes with wedges on the inner or outer aspect of the heel, can help. These can shift pressure away from the side of the knee most bothered by osteoarthritis.  A brace called an "unloader" brace also may be used to help ease the pressure on the most arthritic side of the  knee.  If your caregiver has prescribed crutches, braces, wraps or ice, use as directed. The acronym for this is PRICE. This means protection, rest, ice, compression, and elevation.  Nonsteroidal anti-inflammatory drugs (NSAIDs), can help relieve pain. But if taken immediately after an injury, they may actually increase swelling. Take NSAIDs with food in your stomach. Stop them if you develop stomach problems. Do not take these if you have a history of ulcers, stomach pain, or bleeding from the bowel. Do not take without your caregiver's approval if you have problems with fluid retention, heart failure, or kidney problems.  For ongoing knee problems, physical therapy may be helpful.  Glucosamine and chondroitin are over-the-counter dietary supplements. Both may help relieve the pain of osteoarthritis in the knee. These medicines are different from the usual anti-inflammatory drugs. Glucosamine may decrease the rate of cartilage destruction.  Injections of a corticosteroid drug into your knee joint may help reduce the symptoms of an arthritis flare-up. They may provide pain relief that lasts a few months. You may have to wait a few months between injections. The injections do have a small increased risk of infection, water retention, and elevated blood sugar levels.  Hyaluronic acid injected into damaged joints may ease pain and provide lubrication. These injections may work by reducing inflammation. A series of shots may give relief for as long as 6 months.  Topical painkillers. Applying certain ointments to your skin may help relieve the pain and stiffness of osteoarthritis. Ask your pharmacist for suggestions. Many over the-counter products are approved for temporary relief of arthritis pain.  In some countries, doctors often prescribe topical NSAIDs for relief of chronic conditions such as arthritis and tendinitis. A review of treatment with NSAID creams found that they worked as well as oral  medications but without the serious side effects. PREVENTION  Maintain a healthy weight. Extra pounds put more strain on your joints.  Get strong, stay limber. Weak muscles are a common cause of knee injuries. Stretching is important. Include flexibility exercises in your workouts.  Be smart about exercise. If you have osteoarthritis, chronic knee pain or recurring injuries, you may need to change the way you exercise. This does not mean you have to stop being active. If your knees ache after jogging or playing basketball, consider switching to swimming, water aerobics, or other low-impact activities, at least for a few days a week. Sometimes limiting high-impact activities will provide relief.  Make sure your shoes fit well. Choose footwear that is right for your sport.  Protect your knees. Use the proper gear for knee-sensitive activities. Use kneepads when playing volleyball or laying carpet. Buckle your seat belt every time you drive. Most shattered kneecaps occur in car accidents.  Rest when you are tired. SEEK MEDICAL CARE IF:  You have knee pain that is continual and does not seem to be getting better.  SEEK IMMEDIATE MEDICAL CARE IF:  Your knee joint feels hot to the touch and you have a high fever. MAKE SURE YOU:   Understand  these instructions.  Will watch your condition.  Will get help right away if you are not doing well or get worse. Document Released: 05/21/2007 Document Revised: 10/16/2011 Document Reviewed: 05/21/2007 [facility] Patient Information 2015 [place], [place]. This information is not intended to replace advice given to you by your health care provider. Make sure you discuss any questions you have with your health care provider.  Shoulder Sprain A shoulder sprain is the result of damage to the tough, fiber-like tissues (ligaments) that help hold your shoulder in place. The ligaments may be stretched or torn. Besides the main shoulder joint (the ball and socket),  there are several smaller joints that connect the bones in this area. A sprain usually involves one of those joints. Most often it is the acromioclavicular (or AC) joint. That is the joint that connects the collarbone (clavicle) and the shoulder blade (scapula) at the top point of the shoulder blade (acromion). A shoulder sprain is a mild form of what is called a shoulder separation. Recovering from a shoulder sprain may take some time. For some, pain lingers for several months. Most people recover without long term problems. CAUSES   A shoulder sprain is usually caused by some kind of trauma. This might be:  Falling on an outstretched arm.  Being hit hard on the shoulder.  Twisting the arm.  Shoulder sprains are more likely to occur in people who:  Play sports.  Have balance or coordination problems. SYMPTOMS   Pain when you move your shoulder.  Limited ability to move the shoulder.  Swelling and tenderness on top of the shoulder.  Redness or warmth in the shoulder.  Bruising.  A change in the shape of the shoulder. DIAGNOSIS  Your healthcare provider may:  Ask about your symptoms.  Ask about recent activity that might have caused those symptoms.  Examine your shoulder. You may be asked to do simple exercises to test movement. The other shoulder will be examined for comparison.  Order some tests that provide a look inside the body. They can show the extent of the injury. The tests could include:  X-rays.  CT (computed tomography) scan.  MRI (magnetic resonance imaging) scan. RISKS AND COMPLICATIONS  Loss of full shoulder motion.  Ongoing shoulder pain. TREATMENT  How long it takes to recover from a shoulder sprain depends on how severe it was. Treatment options may include:  Rest. You should not use the arm or shoulder until it heals.  Ice. For 2 or 3 days after the injury, put an ice pack on the shoulder up to 4 times a day. It should stay on for 15 to 20  minutes each time. Wrap the ice in a towel so it does not touch your skin.  Over-the-counter medicine to relieve pain.  A sling or brace. This will keep the arm still while the shoulder is healing.  Physical therapy or rehabilitation exercises. These will help you regain strength and motion. Ask your healthcare provider when it is OK to begin these exercises.  Surgery. The need for surgery is rare with a sprained shoulder, but some people may need surgery to keep the joint in place and reduce pain. HOME CARE INSTRUCTIONS   Ask your healthcare provider about what you should and should not do while your shoulder heals.  Make sure you know how to apply ice to the correct area of your shoulder.  Talk with your healthcare provider about which medications should be used for pain and swelling.  If  rehabilitation therapy will be needed, ask your healthcare provider to refer you to a therapist. If it is not recommended, then ask about at-home exercises. Find out when exercise should begin. SEEK MEDICAL CARE IF:  Your pain, swelling, or redness at the joint increases. SEEK IMMEDIATE MEDICAL CARE IF:   You have a fever.  You cannot move your arm or shoulder. Document Released: 12/10/2008 Document Revised: 10/16/2011 Document Reviewed: 12/10/2008 [facility] Patient Information 2015 [place], [place]. This information is not intended to replace advice given to you by your health care provider. Make sure you discuss any questions you have with your health care provider.

## 2014-11-01 NOTE — ED Notes (Signed)
Tatyana PA at bedside   

## 2014-11-01 NOTE — ED Notes (Signed)
Pt reports she fell down and twisted R knee. Pt sts hx of knee popping in and out. Pt took ibuprofen without relief. Pt also c/o pain to R shoulder which she hit when she fell. No obvious deformity.

## 2014-11-01 NOTE — ED Provider Notes (Signed)
CSN: [id number]     Arrival date & time 11/01/14  2037 History  This chart was scribed for non-physician practitioner, Jeannett Senior, PA-C working with Francine Graven, DO by Frederich Balding, ED scribe. This patient was seen in room TR07C/TR07C and the patient's care was started at 9:20 PM.   Chief Complaint  Patient presents with  . Knee Pain   The history is provided by the patient. No language interpreter was used.    HPI Comments: Krystal [name] is a 29 y.o. female who presents to the Emergency Department complaining of right knee injury that occurred around 7 PM today. Pt fell and twisted her knee. She reports sudden onset sharp pain that radiates into her ankle. Bearing weight worsens pain. Her knee intermittently pops out but she has never seen an orthopedist for it. Pt also reports mild right shoulder pain from hitting it when she fell. Raising her arm worsens pain. She has taken ibuprofen with no relief.   Past Medical History  Diagnosis Date  . Diabetes mellitus   . Obesity   . Breast feeding status of mother    Past Surgical History  Procedure Laterality Date  . Mouth surgery    . Cesarean section N/A 08/18/2013    Procedure: CESAREAN SECTION;  Surgeon: Donnamae Jude, MD;  Location: [facility];  Service: Obstetrics;  Laterality: N/A;   Family History  Problem Relation Age of Onset  . Asthma Mother   . Diabetes Mother   . Alcohol abuse Mother    History  Substance Use Topics  . Smoking status: Current Every Day Smoker -- 0.25 packs/day for 6 years    Types: Cigarettes  . Smokeless tobacco: Never Used     Comment: trying to quitt  . Alcohol Use: No   OB History    Gravida Para Term Preterm AB TAB SAB Ectopic Multiple Living   _0 Review of Systems  Musculoskeletal: Positive for arthralgias.  All other systems reviewed and are negative.  Allergies  Vicodin  Home Medications   Prior to Admission medications   Medication Sig Start Date End  Date Taking? Authorizing Provider  ACCU-CHEK FASTCLIX LANCETS MISC CHECK BLOOD SUGAR ONCE DAILY EVERY MORNING and if signs of high or low sugar 03/19/14   Hilton Sinclair, MD  Blood Glucose Monitoring Suppl (BLOOD GLUCOSE MONITOR SYSTEM) W/DEVICE KIT Use with Accu-check lancets and strips to test blood sugar daily (fasting and if any symptoms of high or low sugar). 03/19/14   Hilton Sinclair, MD  dicyclomine (BENTYL) 20 MG tablet Take 1 tablet (20 mg total) by mouth every 6 (six) hours as needed for spasms (abdominal cramping). 08/02/14   Francine Graven, DO  glucose blood (ACCU-CHEK AVIVA) test strip Use as instructed 03/19/14   Hilton Sinclair, MD  glyBURIDE (DIABETA) 2.5 MG tablet TAKE 1 TABLET BY MOUTH TWICE DAILY, WITH MEALS 06/23/14   Hilton Sinclair, MD  glyBURIDE (DIABETA) 2.5 MG tablet Take 1 tablet (2.5 mg total) by mouth 2 (two) times daily. 08/25/14   Hilton Sinclair, MD  ibuprofen (ADVIL,MOTRIN) 400 MG tablet Take 1 tablet (400 mg total) by mouth every 6 (six) hours as needed for mild pain. 10/30/13   Hilton Sinclair, MD  levonorgestrel (MIRENA) 20 MCG/24HR IUD 1 each by Intrauterine route once.    Historical Provider, MD  metFORMIN (GLUCOPHAGE-XR) 500 MG 24 hr tablet Take 2 tablets (1,000  mg total) by mouth daily with breakfast. 08/25/14   Hilton Sinclair, MD  ondansetron (ZOFRAN ODT) 4 MG disintegrating tablet Take 1 tablet (4 mg total) by mouth every 8 (eight) hours as needed for nausea or vomiting. 08/02/14   Francine Graven, DO  sertraline (ZOLOFT) 100 MG tablet Take 0.5 tablets (50 mg total) by mouth daily. After 1 week increase to 137m daily. 02/24/14   MHilton Sinclair MD   BP 122/52 mmHg  Pulse 102  Temp(Src) 97.4 F (36.3 C) (Oral)  Resp 18  Ht 5' 4" (1.626 m)  Wt 281 lb (127.461 kg)  BMI 48.21 kg/m2  SpO2 96%   Physical Exam  Constitutional: She is oriented to person, place, and time. She appears well-developed and well-nourished. No  distress.  HENT:  Head: Normocephalic and atraumatic.  Eyes: Conjunctivae and EOM are normal.  Neck: Neck supple. No tracheal deviation present.  Cardiovascular: Normal rate.   Pulmonary/Chest: Effort normal. No respiratory distress.  Musculoskeletal: Normal range of motion.  Normal-appearing left knee. Tender to palpation over medial joint. Full range of motion. Negative anterior-posterior drawer signs. No laxity with medial lateral stress. The pulses intact.  Tenderness over posterior right shoulder.. Full range of motion of the shoulder actively and passively. Pain with full flexion and external rotation. Strength is intact. Negative straight arm drop test. Distal radial pulses intact  Neurological: She is alert and oriented to person, place, and time.  Skin: Skin is warm and dry.  Psychiatric: She has a normal mood and affect. Her behavior is normal.  Nursing note and vitals reviewed.   ED Course  Procedures (including critical care time)  DIAGNOSTIC STUDIES: Oxygen Saturation is 96% on RA, normal by my interpretation.    COORDINATION OF CARE: 9:23 PM-Discussed treatment plan which includes knee and shoulder xrays with pt at bedside and pt agreed to plan.   10:23 PM-Advised pt of xray results. Will give her an orthopedic referral for follow up.  Labs Review Labs Reviewed - No data to display  Imaging Review Dg Shoulder Right  11/01/2014   CLINICAL DATA:  Tripped and fell. Fell onto right shoulder and back, with pain on bringing right arm above head. Initial encounter.  EXAM: RIGHT SHOULDER - 2+ VIEW  COMPARISON:  Chest radiograph performed 08/02/2014  FINDINGS: There is no evidence of fracture or dislocation. The right humeral head is seated within the glenoid fossa. The acromioclavicular joint is unremarkable in appearance. No significant soft tissue abnormalities are seen. The visualized portions of the right lung are clear.  IMPRESSION: No evidence of fracture or dislocation.    Electronically Signed   By: JGarald BaldingM.D.   On: 11/01/2014 22:14   Dg Knee Complete 4 Views Left  11/01/2014   CLINICAL DATA:  Status post trip and fall. Fell onto right shoulder and back. Left posterior knee pain. Initial encounter.  EXAM: LEFT KNEE - COMPLETE 4+ VIEW  COMPARISON:  Left knee radiographs performed 12/24/2010  FINDINGS: There is no evidence of fracture or dislocation. The joint spaces are preserved. Mild marginal osteophytes are seen at the medial compartment.  No significant joint effusion is seen. The visualized soft tissues are normal in appearance.  IMPRESSION: No evidence of fracture or dislocation.   Electronically Signed   By: JGarald BaldingM.D.   On: 11/01/2014 22:13     EKG Interpretation None      MDM   Final diagnoses:  Knee sprain, left, initial encounter  Right shoulder  pain   Patient with left knee and right shoulder pain. The knee pain appears to be chronic, shoulder is acute after a fall today. X-rays are both obtained and are negative. Patient is ambulatory. Will start on naproxen, tramadol for severe pain, follow-up with primary care doctor.  Filed Vitals:   11/01/14 2042 11/01/14 2237  BP: 122/52 125/69  Pulse: 102 76  Temp: 97.4 F (36.3 C) 97.6 F (36.4 C)  TempSrc: Oral Oral  Resp: 18 20  Height: 5' 4" (1.626 m)   Weight: 281 lb (127.461 kg)   SpO2: 96% 98%    I personally performed the services described in this documentation, which was scribed in my presence. The recorded information has been reviewed and is accurate.   Jeannett Senior, PA-C 11/01/14 [place], DO 11/02/14 2229

## 2014-12-04 ENCOUNTER — Other Ambulatory Visit (HOSPITAL_COMMUNITY)
Admission: RE | Admit: 2014-12-04 | Discharge: 2014-12-04 | Disposition: A | Discharge status: Home / Self Care | Payer: Medicaid Other | Source: Ambulatory Visit | Attending: Family Medicine | Admitting: Family Medicine

## 2014-12-04 ENCOUNTER — Ambulatory Visit (INDEPENDENT_AMBULATORY_CARE_PROVIDER_SITE_OTHER): Payer: Medicaid Other | Admitting: Family Medicine

## 2014-12-04 ENCOUNTER — Encounter: Payer: Self-pay | Admitting: Family Medicine

## 2014-12-04 VITALS — BP 139/92 | HR 92 | Temp 98.1°F | Wt 274.1 lb

## 2014-12-04 DIAGNOSIS — K130 Diseases of lips: Secondary | ICD-10-CM | POA: Diagnosis not present

## 2014-12-04 DIAGNOSIS — K649 Unspecified hemorrhoids: Secondary | ICD-10-CM | POA: Diagnosis not present

## 2014-12-04 DIAGNOSIS — Z113 Encounter for screening for infections with a predominantly sexual mode of transmission: Secondary | ICD-10-CM | POA: Insufficient documentation

## 2014-12-04 DIAGNOSIS — R300 Dysuria: Secondary | ICD-10-CM

## 2014-12-04 LAB — POCT UA - MICROSCOPIC ONLY

## 2014-12-04 LAB — POCT URINALYSIS DIPSTICK
Bilirubin, UA: NEGATIVE
Blood, UA: NEGATIVE
Glucose, UA: 500
Ketones, UA: NEGATIVE
LEUKOCYTES UA: NEGATIVE
Nitrite, UA: NEGATIVE
PH UA: 8.5
Protein, UA: 30
SPEC GRAV UA: 1.015
Urobilinogen, UA: 1

## 2014-12-04 LAB — HEMOCCULT GUIAC POC 1CARD (OFFICE): FECAL OCCULT BLD: NEGATIVE

## 2014-12-04 LAB — POCT WET PREP (WET MOUNT): Clue Cells Wet Prep Whiff POC: NEGATIVE

## 2014-12-04 MED ORDER — NYSTATIN 100000 UNIT/GM EX CREA: 1.0000 "application " | TOPICAL_CREAM | Freq: Two times a day (BID) | CUTANEOUS | Status: DC

## 2014-12-04 MED ORDER — LIDOCAINE-HYDROCORTISONE ACE 2-2 % RE KIT: 1.0000 | PACK | Freq: Two times a day (BID) | RECTAL | Status: DC

## 2014-12-04 NOTE — Patient Instructions (Signed)
Thank you for coming to see me today. It was a pleasure. Today we talked about:   Vaginal discharge: I am getting a wet prep. I will call you with results. You do have an infection on the skin around your vagina. I am prescribing a topical cream for you to use.  Hemorrhoid: I am prescribing a rectal applicator for you to use daily to help withy our pain. You can also take daily sitz baths. Increase fiber in your diet so you have easier stools.  Please make an appointment to see Dr. Benjamin Stain for follow-up  If you have any questions or concerns, please do not hesitate to call the office at [phone].  Sincerely,  Jacquelin Hawking, MD  Hemorrhoids Hemorrhoids are swollen veins around the rectum or anus. There are two types of hemorrhoids:   Internal hemorrhoids. These occur in the veins just inside the rectum. They may poke through to the outside and become irritated and painful.  External hemorrhoids. These occur in the veins outside the anus and can be felt as a painful swelling or hard lump near the anus. CAUSES  Pregnancy.   Obesity.   Constipation or diarrhea.   Straining to have a bowel movement.   Sitting for long periods on the toilet.  Heavy lifting or other activity that caused you to strain.  Anal intercourse. SYMPTOMS   Pain.   Anal itching or irritation.   Rectal bleeding.   Fecal leakage.   Anal swelling.   One or more lumps around the anus.  DIAGNOSIS  Your caregiver may be able to diagnose hemorrhoids by visual examination. Other examinations or tests that may be performed include:   Examination of the rectal area with a gloved hand (digital rectal exam).   Examination of anal canal using a small tube (scope).   A blood test if you have lost a significant amount of blood.  A test to look inside the colon (sigmoidoscopy or colonoscopy). TREATMENT Most hemorrhoids can be treated at home. However, if symptoms do not seem to be  getting better or if you have a lot of rectal bleeding, your caregiver may perform a procedure to help make the hemorrhoids get smaller or remove them completely. Possible treatments include:   Placing a rubber band at the base of the hemorrhoid to cut off the circulation (rubber band ligation).   Injecting a chemical to shrink the hemorrhoid (sclerotherapy).   Using a tool to burn the hemorrhoid (infrared light therapy).   Surgically removing the hemorrhoid (hemorrhoidectomy).   Stapling the hemorrhoid to block blood flow to the tissue (hemorrhoid stapling).  HOME CARE INSTRUCTIONS   Eat foods with fiber, such as whole grains, beans, nuts, fruits, and vegetables. Ask your doctor about taking products with added fiber in them (fibersupplements).  Increase fluid intake. Drink enough water and fluids to keep your urine clear or pale yellow.   Exercise regularly.   Go to the bathroom when you have the urge to have a bowel movement. Do not wait.   Avoid straining to have bowel movements.   Keep the anal area dry and clean. Use wet toilet paper or moist towelettes after a bowel movement.   Medicated creams and suppositories may be used or applied as directed.   Only take over-the-counter or prescription medicines as directed by your caregiver.   Take warm sitz baths for 15-20 minutes, 3-4 times a day to ease pain and discomfort.   Place ice packs on the hemorrhoids if  they are tender and swollen. Using ice packs between sitz baths may be helpful.   Put ice in a plastic bag.   Place a towel between your skin and the bag.   Leave the ice on for 15-20 minutes, 3-4 times a day.   Do not use a donut-shaped pillow or sit on the toilet for long periods. This increases blood pooling and pain.  SEEK MEDICAL CARE IF:  You have increasing pain and swelling that is not controlled by treatment or medicine.  You have uncontrolled bleeding.  You have difficulty or you are  unable to have a bowel movement.  You have pain or inflammation outside the area of the hemorrhoids. MAKE SURE YOU:  Understand these instructions.  Will watch your condition.  Will get help right away if you are not doing well or get worse. Document Released: 07/21/2000 Document Revised: 07/10/2012 Document Reviewed: 05/28/2012 [facility] Patient Information 2015 [place], [place]. This information is not intended to replace advice given to you by your health care provider. Make sure you discuss any questions you have with your health care provider.

## 2014-12-04 NOTE — Progress Notes (Signed)
    Subjective   Krystal [name] is a 29 y.o. female that presents for a same day visit  1. Dysuria: Symptoms started one week ago. She has dysuria. She has itching and vaginal discharge. No fevers, chills.  2. Anal pain: Patient reports a history of pain around her anus. It is especially painful with normal bowel movements. She has also noticed hematochezia with bowel movements. She believes that she has a hemorrhoid. She has not tried anything to help with symptoms.  ROS Per HPI  History  Substance Use Topics  . Smoking status: Current Every Day Smoker -- 0.25 packs/day for 6 years    Types: Cigarettes  . Smokeless tobacco: Never Used     Comment: trying to quitt  . Alcohol Use: No    Allergies  Allergen Reactions  . Vicodin [Hydrocodone-Acetaminophen] Rash    Objective   BP 139/92 mmHg  Pulse 92  Temp(Src) 98.1 F (36.7 C) (Oral)  Wt 274 lb 1.6 oz (124.331 kg)  General: Well appearing.  Gastrointestinal:  External hemorrhoid located in 6 o'clock position of anus that is tender. Fecal occult negative Genitourinary: beefy red rash around labia that are tender. No other lesions noted. Thick white discharge located in the vagina without bleeding or odor. Neuro: Alert  Assessment and Plan   Meds ordered this encounter  Medications  . Lidocaine-Hydrocortisone Ace 2-2 % KIT    Sig: Place 1 applicator rectally 2 (two) times daily.    Dispense:  20 each    Refill:  0  . nystatin cream (MYCOSTATIN)    Sig: Apply 1 application topically 2 (two) times daily.    Dispense:  30 g    Refill:  0    Intertrigo labialis Candida infection External Hemorrhoid  Nystatin cream BID daily  Wet prep, GC/Chlamydia  Lidocaine-hydrocortisone cream daily for hemorrhoid 

## 2014-12-07 LAB — URINE CYTOLOGY ANCILLARY ONLY
CHLAMYDIA, DNA PROBE: NEGATIVE
Neisseria Gonorrhea: NEGATIVE

## 2014-12-13 ENCOUNTER — Encounter: Payer: Self-pay | Admitting: Family Medicine

## 2014-12-17 ENCOUNTER — Encounter: Payer: Self-pay | Admitting: Family Medicine

## 2015-01-25 ENCOUNTER — Encounter: Payer: Self-pay | Admitting: Family Medicine

## 2015-01-25 ENCOUNTER — Ambulatory Visit (INDEPENDENT_AMBULATORY_CARE_PROVIDER_SITE_OTHER): Payer: Medicaid Other | Admitting: Family Medicine

## 2015-01-25 VITALS — BP 148/91 | HR 99 | Temp 98.0°F | Ht 64.0 in | Wt 269.0 lb

## 2015-01-25 DIAGNOSIS — J020 Streptococcal pharyngitis: Secondary | ICD-10-CM

## 2015-01-25 DIAGNOSIS — J029 Acute pharyngitis, unspecified: Secondary | ICD-10-CM

## 2015-01-25 LAB — POCT RAPID STREP A (OFFICE): RAPID STREP A SCREEN: POSITIVE — AB

## 2015-01-25 MED ORDER — FLUCONAZOLE 150 MG PO TABS: 150.0000 mg | ORAL_TABLET | Freq: Once | ORAL | Status: DC

## 2015-01-25 MED ORDER — PENICILLIN V POTASSIUM 500 MG PO TABS: 500.0000 mg | ORAL_TABLET | Freq: Three times a day (TID) | ORAL | Status: DC

## 2015-01-25 NOTE — Progress Notes (Signed)
Patient ID: Krystal [name], female   DOB: Apr 09, 1986, 29 y.o.   MRN: [id number]  HPI:  Pt presents for a same day appointment to discuss sore throat.  Sore throat began yesterday. Felt warm overnight but is not sure about fever. No sick contacts. Some mild congestion in face and mild cough. Tender area on L neck. Tolerating PO intake.  ROS: See HPI  PMFSH: hx depression, T2DM, IUD in place, hidradenitis, obesity  PHYSICAL EXAM: BP 148/91 mmHg  Pulse 99  Temp(Src) 98 F (36.7 C) (Oral)  Ht 5\' 4"  (1.626 m)  Wt 269 lb (122.018 kg)  BMI 46.15 kg/m2 Gen: NAD, appears that does not feel well HEENT: NCAT, TM's clear bilat, nares with some discharge, oropharynx erythematous with some white exudate. Tender anterior cervical LAD, L>R.  Heart: RRR no murmur Lungs: CTAB NWOB, speaks in full sentences without distress Neuro: speech normal, grossly nonfocal  ASSESSMENT/PLAN:  1. Sore throat - rapid strep positive. Treat with penicillin 500mg  TID x 10 days. Gave rx for diflucan in case develops yeast vaginitis. F/u if not improving later this week.   FOLLOW UP: F/u as needed if symptoms worsen or do not improve.   [place] J. Pollie Meyer, MD [facility]

## 2015-01-25 NOTE — Patient Instructions (Signed)
Take penicillin 500mg  three times a day for 10 days Sent in diflucan as well in case you get a yeast infection Return if not improving by later this week  Be well, Dr. Pollie Meyer    Strep Throat Strep throat is an infection of the throat caused by a bacteria named Streptococcus pyogenes. Your health care provider may call the infection streptococcal "tonsillitis" or "pharyngitis" depending on whether there are signs of inflammation in the tonsils or back of the throat. Strep throat is most common in children aged 5-15 years during the cold months of the year, but it can occur in people of any age during any season. This infection is spread from person to person (contagious) through coughing, sneezing, or other close contact. SIGNS AND SYMPTOMS   Fever or chills.  Painful, swollen, red tonsils or throat.  Pain or difficulty when swallowing.  White or yellow spots on the tonsils or throat.  Swollen, tender lymph nodes or "glands" of the neck or under the jaw.  Red rash all over the body (rare). DIAGNOSIS  Many different infections can cause the same symptoms. A test must be done to confirm the diagnosis so the right treatment can be given. A "rapid strep test" can help your health care provider make the diagnosis in a few minutes. If this test is not available, a light swab of the infected area can be used for a throat culture test. If a throat culture test is done, results are usually available in a day or two. TREATMENT  Strep throat is treated with antibiotic medicine. HOME CARE INSTRUCTIONS   Gargle with 1 tsp of salt in 1 cup of warm water, 3-4 times per day or as needed for comfort.  Family members who also have a sore throat or fever should be tested for strep throat and treated with antibiotics if they have the strep infection.  Make sure everyone in your household washes their hands well.  Do not share food, drinking cups, or personal items that could cause the infection to  spread to others.  You may need to eat a soft food diet until your sore throat gets better.  Drink enough water and fluids to keep your urine clear or pale yellow. This will help prevent dehydration.  Get plenty of rest.  Stay home from school, day care, or work until you have been on antibiotics for 24 hours.  Take medicines only as directed by your health care provider.  Take your antibiotic medicine as directed by your health care provider. Finish it even if you start to feel better. SEEK MEDICAL CARE IF:   The glands in your neck continue to enlarge.  You develop a rash, cough, or earache.  You cough up green, yellow-brown, or bloody sputum.  You have pain or discomfort not controlled by medicines.  Your problems seem to be getting worse rather than better.  You have a fever. SEEK IMMEDIATE MEDICAL CARE IF:   You develop any new symptoms such as vomiting, severe headache, stiff or painful neck, chest pain, shortness of breath, or trouble swallowing.  You develop severe throat pain, drooling, or changes in your voice.  You develop swelling of the neck, or the skin on the neck becomes red and tender.  You develop signs of dehydration, such as fatigue, dry mouth, and decreased urination.  You become increasingly sleepy, or you cannot wake up completely. MAKE SURE YOU:  Understand these instructions.  Will watch your condition.  Will get help  right away if you are not doing well or get worse. Document Released: 07/21/2000 Document Revised: 12/08/2013 Document Reviewed: 09/22/2010 [facility] Patient Information 2015 [place]ExitCare, MarylandLLC. This information is not intended to replace advice given to you by your health care provider. Make sure you discuss any questions you have with your health care provider.

## 2015-02-01 ENCOUNTER — Other Ambulatory Visit: Payer: Self-pay

## 2015-02-10 ENCOUNTER — Ambulatory Visit (INDEPENDENT_AMBULATORY_CARE_PROVIDER_SITE_OTHER): Payer: Medicaid Other | Admitting: Family Medicine

## 2015-02-10 ENCOUNTER — Encounter: Payer: Self-pay | Admitting: Family Medicine

## 2015-02-10 VITALS — BP 129/91 | HR 99 | Temp 97.6°F | Wt 266.6 lb

## 2015-02-10 DIAGNOSIS — J029 Acute pharyngitis, unspecified: Secondary | ICD-10-CM

## 2015-02-10 LAB — POCT MONO (EPSTEIN BARR VIRUS): MONO, POC: NEGATIVE

## 2015-02-10 NOTE — Patient Instructions (Signed)
Thank you for coming in,   I will call you with the results from today.   Based on the culture and monospot will determine our course of action.   Please bring all of your medications with you to each visit.    Please feel free to call with any questions or concerns at any time, at [phone]. --Dr. Jordan LikesSchmitz

## 2015-02-10 NOTE — Progress Notes (Signed)
   Subjective:    Patient ID: Krystal [name], female    DOB: 1986/07/01, 29 y.o.   MRN: [id number]  Seen for Same day visit for   CC: throat pain   She is presenting with ongoing throat pain. She had a positive rapid strep on June 20 and [facility] She was compliant with the medication. She felt a little relief in her pain but it never fully went away. Her son was then dx with strep pharyngitis. Having subjective fevers, swollen lymph nodes and pain with swallowing.  Denies any chills, night sweats, nausea, vomiting, constipation or diarrhea.  Denies any oral sexual contacts. She has no trouble eating or breathing.    Review of Systems   See HPI for ROS. Objective:  BP 129/91 mmHg  Pulse 99  Temp(Src) 97.6 F (36.4 C) (Oral)  Wt 266 lb 9.6 oz (120.929 kg)  General: NAD HEENT: normal conjunctiva, Cervical LAD, Tm's clear b/l, soft palate with some exudates but no tonsillar.   Cardiac: RRR, normal heart sounds, no murmurs. Respiratory: CTAB, normal effort Abdomen: soft, nontender, nondistended, no hepatic or splenomegaly. Bowel sounds present Skin: warm and dry, no rashes noted      Assessment & Plan:  See Problem List Documentation

## 2015-02-11 ENCOUNTER — Encounter: Payer: Self-pay | Admitting: Family Medicine

## 2015-02-11 DIAGNOSIS — J029 Acute pharyngitis, unspecified: Secondary | ICD-10-CM | POA: Insufficient documentation

## 2015-02-11 LAB — STREP A DNA PROBE: GASP: POSITIVE

## 2015-02-11 NOTE — Assessment & Plan Note (Signed)
Possible for continued infection. She has some exudates observed on exam  - monospot negative  - Culture positive - will need to call and try alternative agent of ABX

## 2015-02-12 ENCOUNTER — Telehealth: Payer: Self-pay | Admitting: Family Medicine

## 2015-02-12 NOTE — Telephone Encounter (Signed)
Spoke with patient and she is almost a 100% better. Will hold off on giving another course of ABX since she has already been treated. May be a carrier a f/u throat cx could be done in about a month when symptom free.   Myra RudeJeremy E Schmitz, MD PGY-3, [facility] 02/12/2015, 10:15 AM

## 2015-05-07 ENCOUNTER — Ambulatory Visit (INDEPENDENT_AMBULATORY_CARE_PROVIDER_SITE_OTHER): Payer: Medicaid Other | Admitting: Family Medicine

## 2015-05-07 VITALS — BP 130/90 | HR 87 | Temp 98.2°F | Wt 265.4 lb

## 2015-05-07 DIAGNOSIS — E119 Type 2 diabetes mellitus without complications: Secondary | ICD-10-CM

## 2015-05-07 DIAGNOSIS — E118 Type 2 diabetes mellitus with unspecified complications: Secondary | ICD-10-CM

## 2015-05-07 DIAGNOSIS — M25561 Pain in right knee: Secondary | ICD-10-CM | POA: Diagnosis not present

## 2015-05-07 DIAGNOSIS — I10 Essential (primary) hypertension: Secondary | ICD-10-CM

## 2015-05-07 DIAGNOSIS — M25562 Pain in left knee: Secondary | ICD-10-CM

## 2015-05-07 DIAGNOSIS — Z79899 Other long term (current) drug therapy: Secondary | ICD-10-CM

## 2015-05-07 DIAGNOSIS — Z0000 Encounter for general adult medical examination without abnormal findings: Secondary | ICD-10-CM

## 2015-05-07 LAB — LIPID PANEL
CHOLESTEROL: 125 mg/dL (ref 125–200)
HDL: 34 mg/dL — AB (ref >=46)
LDL Cholesterol: 62 mg/dL (ref <130)
TRIGLYCERIDES: 144 mg/dL (ref <150)
Total CHOL/HDL Ratio: 3.7 Ratio (ref <=5.0)
VLDL: 29 mg/dL (ref <30)

## 2015-05-07 LAB — CBC
HEMATOCRIT: 46.6 % — AB (ref 36.0–46.0)
HEMOGLOBIN: 15.7 g/dL — AB (ref 12.0–15.0)
MCH: 30.3 pg (ref 26.0–34.0)
MCHC: 33.7 g/dL (ref 30.0–36.0)
MCV: 90 fL (ref 78.0–100.0)
MPV: 9.7 fL (ref 8.6–12.4)
Platelets: 273 10*3/uL (ref 150–400)
RBC: 5.18 MIL/uL — ABNORMAL HIGH (ref 3.87–5.11)
RDW: 12.5 % (ref 11.5–15.5)
WBC: 9.5 10*3/uL (ref 4.0–10.5)

## 2015-05-07 LAB — BASIC METABOLIC PANEL WITH GFR
BUN: 8 mg/dL (ref 7–25)
CALCIUM: 9.3 mg/dL (ref 8.6–10.2)
CO2: 26 mmol/L (ref 20–31)
Chloride: 98 mmol/L (ref 98–110)
Creat: 0.57 mg/dL (ref 0.50–1.10)
GFR, Est African American: >89 mL/min (ref >=60)
GLUCOSE: 410 mg/dL — AB (ref 65–99)
Potassium: 4.4 mmol/L (ref 3.5–5.3)
Sodium: 131 mmol/L — ABNORMAL LOW (ref 135–146)

## 2015-05-07 LAB — POCT GLYCOSYLATED HEMOGLOBIN (HGB A1C): HEMOGLOBIN A1C: 9.9

## 2015-05-07 MED ORDER — BLOOD GLUCOSE MONITOR SYSTEM W/DEVICE KIT: PACK | Status: DC

## 2015-05-07 MED ORDER — METFORMIN HCL ER 500 MG PO TB24: 1000.0000 mg | ORAL_TABLET | Freq: Every day | ORAL | Status: DC

## 2015-05-07 MED ORDER — GLYBURIDE 2.5 MG PO TABS: 2.5000 mg | ORAL_TABLET | Freq: Two times a day (BID) | ORAL | Status: DC

## 2015-05-07 NOTE — Patient Instructions (Signed)
Thank you for coming to the clinic today. It was nice seeing you.  We will refill your diabetes medications today. We will also give you a prescription for your glucometer. Please start your metformin back at 1 pill daily for a week, then increase to 2 pills daily.  We will refer you to sports medicine for your knee pain.  You are due for a pap smear. Please come back for this test.  Please come back in 1 month to follow up for your diabetes.  Take care,  Dr Jimmey Ralph

## 2015-05-07 NOTE — Assessment & Plan Note (Addendum)
Plain film from earlier this year unremarkable, though only left knee imaged. Doubt OA. Possible meniscal tear given history of popping and prior injury. Continue conservative management with ibuprofen and tylenol with ice as needed. Will refer to sports medicine.

## 2015-05-07 NOTE — Assessment & Plan Note (Signed)
Due for pap test. Patient will schedule an appointment for this. Will check lipid panel, BMET, and CBC today.

## 2015-05-07 NOTE — Progress Notes (Signed)
Subjective:  Krystal [name] is a 29 y.o. female who presents to the [facility] today with a chief complaint of diabetes follow up.   HPI:  DM Patient reports that she has been off her diabetes medications for the past 3 months. Was previously managed on metformin and glyburide. Reports occasional polyuria and polydipsia recently. No chest pain or shortness of breath. No vision changes. No hand or feet numbness/tingling.   Knee Pain Patient also presents with bilateral knee pain, right worse than left. PAaient reports that she has had chronic knee pain since she was 79 or 29 years old after an injury. She was previously seen in this clinic approximately 6 months ago. Plain films were obtained which her negative. Pain is mostly located in the lateral aspect of her knee. She also endorses frequent "popping" when walking and bending. Also endorses occasional swelling in her knees. Patient has tried physical therapy in the past without significant improvement. Has also tried pain medications in the past which help some.   Healthcare Maintenance Due for pap test.   ROS: Per HPI, otherwise all systems reviewed and are negative  PMH:  The following were reviewed and entered/updated in epic: Past Medical History  Diagnosis Date  . Diabetes mellitus   . Obesity   . Breast feeding status of mother    Patient Active Problem List   Diagnosis Date Noted  . Healthcare maintenance 05/07/2015  . Bilateral knee pain 10/08/2014  . Acne 05/14/2014  . Alopecia 05/12/2014  . Encounter for insertion of mirena IUD 10/01/2013  . S/P C-section 08/18/2013  . GERD (gastroesophageal reflux disease) 08/30/2012  . HIDRADENITIS SUPPURATIVA 03/17/2009  . ECZEMA, ATOPIC 08/26/2007  . DEPRESSION, MILD 05/08/2007  . Diabetes mellitus 10/04/2006  . OBESITY, NOS 10/04/2006  . TOBACCO DEPENDENCE 10/04/2006  . MIGRAINE, UNSPEC., W/O INTRACTABLE MIGRAINE 10/04/2006   Past Surgical History  Procedure Laterality  Date  . Mouth surgery    . Cesarean section N/A 08/18/2013    Procedure: CESAREAN SECTION;  Surgeon: Reva Bores, MD;  Location: [facility];  Service: Obstetrics;  Laterality: N/A;     Objective:  Physical Exam: BP 130/90 mmHg  Pulse 87  Temp(Src) 98.2 F (36.8 C) (Oral)  Wt 265 lb 6.4 oz (120.385 kg)  Gen: NAD, resting comfortably CV: RRR with no murmurs appreciated Lungs: NWOB, CTAB with no crackles, wheezes, or rhonchi GI: Normal bowel sounds present. Obese, Soft, Nontender, Nondistended. MSK:  - Right Knee: No effusion or erythema. Tender to palpation over lateral joint line. FROM. No crepitus. Pain with axial joint loading. - Left knee: No effusion or erythema. Nontender to palpation. FROM. No crepitus. Mild pain with axial joint loading. Skin: warm, dry Neuro: grossly normal, moves all extremities Psych: Normal affect and thought content   Assessment/Plan:  Diabetes mellitus A1c significantly elevated to 9.9 today. Will restart metformin and glyburide. Instructed patient to check fasting blood sugars occasionally. Will follow up in 1 month. May consider adding additional agent at that time. Will also need urine micro-albumin and foot exam.   Bilateral knee pain Plain film from earlier this year unremarkable, though only left knee imaged. Doubt OA. Possible meniscal tear given history of popping and prior injury. Continue conservative management with ibuprofen and tylenol with ice as needed. Will refer to sports medicine.   Healthcare maintenance Due for pap test. Patient will schedule an appointment for this. Will check lipid panel, BMET, and CBC today.     Katina Degree.  Jimmey Ralph, MD [facility] Resident PGY-2 05/07/2015 11:30 AM

## 2015-05-07 NOTE — Assessment & Plan Note (Signed)
A1c significantly elevated to 9.9 today. Will restart metformin and glyburide. Instructed patient to check fasting blood sugars occasionally. Will follow up in 1 month. May consider adding additional agent at that time. Will also need urine micro-albumin and foot exam.

## 2015-05-10 ENCOUNTER — Encounter: Payer: Self-pay | Admitting: Family Medicine

## 2015-05-20 ENCOUNTER — Encounter (HOSPITAL_COMMUNITY): Payer: Self-pay | Admitting: Emergency Medicine

## 2015-05-20 ENCOUNTER — Emergency Department (HOSPITAL_COMMUNITY)
Admission: EM | Admit: 2015-05-20 | Discharge: 2015-05-20 | Disposition: A | Discharge status: Home / Self Care | Payer: Medicaid Other | Attending: Emergency Medicine | Admitting: Emergency Medicine

## 2015-05-20 DIAGNOSIS — J0100 Acute maxillary sinusitis, unspecified: Secondary | ICD-10-CM

## 2015-05-20 DIAGNOSIS — Z720 Tobacco use: Secondary | ICD-10-CM | POA: Diagnosis not present

## 2015-05-20 DIAGNOSIS — H9203 Otalgia, bilateral: Secondary | ICD-10-CM | POA: Diagnosis present

## 2015-05-20 DIAGNOSIS — E669 Obesity, unspecified: Secondary | ICD-10-CM | POA: Diagnosis not present

## 2015-05-20 DIAGNOSIS — R000 Tachycardia, unspecified: Secondary | ICD-10-CM | POA: Diagnosis not present

## 2015-05-20 DIAGNOSIS — H53149 Visual discomfort, unspecified: Secondary | ICD-10-CM | POA: Diagnosis not present

## 2015-05-20 DIAGNOSIS — Z7952 Long term (current) use of systemic steroids: Secondary | ICD-10-CM | POA: Diagnosis not present

## 2015-05-20 DIAGNOSIS — Z79899 Other long term (current) drug therapy: Secondary | ICD-10-CM | POA: Diagnosis not present

## 2015-05-20 DIAGNOSIS — E119 Type 2 diabetes mellitus without complications: Secondary | ICD-10-CM | POA: Insufficient documentation

## 2015-05-20 DIAGNOSIS — H6500 Acute serous otitis media, unspecified ear: Secondary | ICD-10-CM | POA: Insufficient documentation

## 2015-05-20 MED ORDER — TRAMADOL HCL 50 MG PO TABS: 50.0000 mg | ORAL_TABLET | Freq: Four times a day (QID) | ORAL | Status: DC | PRN

## 2015-05-20 MED ORDER — AMOXICILLIN 500 MG PO CAPS: 500.0000 mg | ORAL_CAPSULE | Freq: Three times a day (TID) | ORAL | Status: DC

## 2015-05-20 MED ORDER — OXYCODONE-ACETAMINOPHEN 5-325 MG PO TABS
1.0000 | ORAL_TABLET | Freq: Once | ORAL | Status: AC
  Administered 2015-05-20: 1 via ORAL

## 2015-05-20 MED ORDER — PSEUDOEPHEDRINE HCL 30 MG PO TABS: 30.0000 mg | ORAL_TABLET | Freq: Four times a day (QID) | ORAL | Status: DC | PRN

## 2015-05-20 MED ORDER — OXYCODONE-ACETAMINOPHEN 5-325 MG PO TABS
1.0000 | ORAL_TABLET | Freq: Once | ORAL | Status: AC
  Administered 2015-05-20: 1 via ORAL
  Filled 2015-05-20: qty 1

## 2015-05-20 MED ORDER — OXYCODONE-ACETAMINOPHEN 5-325 MG PO TABS
ORAL_TABLET | ORAL | Status: AC
  Filled 2015-05-20: qty 1

## 2015-05-20 MED ORDER — ONDANSETRON 4 MG PO TBDP
4.0000 mg | ORAL_TABLET | Freq: Once | ORAL | Status: AC
  Administered 2015-05-20: 4 mg via ORAL
  Filled 2015-05-20: qty 1

## 2015-05-20 NOTE — Discharge Instructions (Signed)
You will need to follow up with your doctor in one week to be sure the infection is improving. Do not drive while taking the narcotic as it will make you sleepy. Return here for worsening symptoms.    Otitis Media, Adult Otitis media is redness, soreness, and inflammation of the middle ear. Otitis media may be caused by allergies or, most commonly, by infection. Often it occurs as a complication of the common cold. SIGNS AND SYMPTOMS Symptoms of otitis media may include:  Earache.  Fever.  Ringing in your ear.  Headache.  Leakage of fluid from the ear. DIAGNOSIS To diagnose otitis media, your health care provider will examine your ear with an otoscope. This is an instrument that allows your health care provider to see into your ear in order to examine your eardrum. Your health care provider also will ask you questions about your symptoms. TREATMENT  Typically, otitis media resolves on its own within 3-5 days. Your health care provider may prescribe medicine to ease your symptoms of pain. If otitis media does not resolve within 5 days or is recurrent, your health care provider may prescribe antibiotic medicines if he or she suspects that a bacterial infection is the cause. HOME CARE INSTRUCTIONS   If you were prescribed an antibiotic medicine, finish it all even if you start to feel better.  Take medicines only as directed by your health care provider.  Keep all follow-up visits as directed by your health care provider. SEEK MEDICAL CARE IF:  You have otitis media only in one ear, or bleeding from your nose, or both.  You notice a lump on your neck.  You are not getting better in 3-5 days.  You feel worse instead of better. SEEK IMMEDIATE MEDICAL CARE IF:   You have pain that is not controlled with medicine.  You have swelling, redness, or pain around your ear or stiffness in your neck.  You notice that part of your face is paralyzed.  You notice that the bone behind your  ear (mastoid) is tender when you touch it. MAKE SURE YOU:   Understand these instructions.  Will watch your condition.  Will get help right away if you are not doing well or get worse.   This information is not intended to replace advice given to you by your health care provider. Make sure you discuss any questions you have with your health care provider.   Document Released: 04/28/2004 Document Revised: 08/14/2014 Document Reviewed: 02/18/2013 Elsevier Interactive Patient Education [employer].

## 2015-05-20 NOTE — ED Notes (Signed)
Pt reports yesterday started having sinus congestion and R ear pain. Today she now has severe L ear pain and difficulty hearing. sts she has had intermittent fever.

## 2015-05-20 NOTE — ED Provider Notes (Signed)
CSN: [id number]     Arrival date & time 05/20/15  1920 History  By signing my name below, I, Meriel Pica, attest that this documentation has been prepared under the direction and in the presence of Debroah Baller, NP.  Electronically Signed: Meriel Pica, ED Scribe. 05/20/2015. 8:05 PM.   Chief Complaint  Patient presents with  . Otalgia   Patient is a 29 y.o. female presenting with ear pain. The history is provided by the patient. No language interpreter was used.  Otalgia Location:  Left Behind ear:  No abnormality Quality:  Throbbing Severity:  Severe Onset quality:  Sudden Duration:  1 day Timing:  Constant Progression:  Unchanged Chronicity:  New Relieved by:  None tried Worsened by:  Nothing tried Ineffective treatments:  None tried Associated symptoms: congestion and headaches   Associated symptoms: no cough, no ear discharge and no fever    HPI Comments: Krystal [name] is a 29 y.o. female, with a PMhx of DM and obesity, who presents to the Emergency Department complaining of constant, worsening URI symptoms onset 7 days ago. Pt reports her symptoms began as right-sided otalgia and nasal congestion but progressed last night with subjective fevers, chills, and headaches with associated photophobia. She additionally reports onset of throbbing, severe, left-sided otalgia this morning with decreased hearing in left ear. Pt has not taken any alleviating medication for her symptoms. She reports a history of migraines and notes her current HAs with associated photophobia resemble her past migraines. Pt afebrile on triage vitals. Denies cough or drainage from ears.   Past Medical History  Diagnosis Date  . Diabetes mellitus   . Obesity   . Breast feeding status of mother    Past Surgical History  Procedure Laterality Date  . Mouth surgery    . Cesarean section N/A 08/18/2013    Procedure: CESAREAN SECTION;  Surgeon: Donnamae Jude, MD;  Location: [facility];  Service:  Obstetrics;  Laterality: N/A;   Family History  Problem Relation Age of Onset  . Asthma Mother   . Diabetes Mother   . Alcohol abuse Mother    Social History  Substance Use Topics  . Smoking status: Current Every Day Smoker -- 0.25 packs/day for 6 years    Types: Cigarettes  . Smokeless tobacco: Never Used     Comment: trying to quitt  . Alcohol Use: No   OB History    Gravida Para Term Preterm AB TAB SAB Ectopic Multiple Living   '2 2 2       2     ' Review of Systems  Constitutional: Negative for fever and chills.  HENT: Positive for congestion and ear pain ( right). Negative for ear discharge.   Eyes: Positive for photophobia.  Respiratory: Negative for cough.   Neurological: Positive for headaches.  All other systems reviewed and are negative.  Allergies  Vicodin  Home Medications   Prior to Admission medications   Medication Sig Start Date End Date Taking? Authorizing Provider  ACCU-CHEK FASTCLIX LANCETS MISC CHECK BLOOD SUGAR ONCE DAILY EVERY MORNING and if signs of high or low sugar 03/19/14   Hilton Sinclair, MD  amoxicillin (AMOXIL) 500 MG capsule Take 1 capsule (500 mg total) by mouth 3 (three) times daily. 05/20/15   Tysen Roesler Bunnie Pion, NP  Blood Glucose Monitoring Suppl (BLOOD GLUCOSE MONITOR SYSTEM) W/DEVICE KIT Use with Accu-check lancets and strips to test blood sugar daily (fasting and if any symptoms of high or low sugar). 05/07/15  Vivi Barrack, MD  dicyclomine (BENTYL) 20 MG tablet Take 1 tablet (20 mg total) by mouth every 6 (six) hours as needed for spasms (abdominal cramping). 08/02/14   Francine Graven, DO  glucose blood (ACCU-CHEK AVIVA) test strip Use as instructed 03/19/14   Hilton Sinclair, MD  glyBURIDE (DIABETA) 2.5 MG tablet Take 1 tablet (2.5 mg total) by mouth 2 (two) times daily. 05/07/15   Vivi Barrack, MD  ibuprofen (ADVIL,MOTRIN) 600 MG tablet Take 1 tablet (600 mg total) by mouth every 6 (six) hours as needed. 11/01/14   Jeannett Senior, PA-C  levonorgestrel (MIRENA) 20 MCG/24HR IUD 1 each by Intrauterine route once.    Historical Provider, MD  Lidocaine-Hydrocortisone Ace 2-2 % KIT Place 1 applicator rectally 2 (two) times daily. 12/04/14   Mariel Aloe, MD  metFORMIN (GLUCOPHAGE-XR) 500 MG 24 hr tablet Take 2 tablets (1,000 mg total) by mouth daily with breakfast. 05/07/15   Vivi Barrack, MD  ondansetron (ZOFRAN ODT) 4 MG disintegrating tablet Take 1 tablet (4 mg total) by mouth every 8 (eight) hours as needed for nausea or vomiting. 08/02/14   Francine Graven, DO  pseudoephedrine (SUDAFED) 30 MG tablet Take 1 tablet (30 mg total) by mouth every 6 (six) hours as needed for congestion. 05/20/15   Jaeden [place]bay Bunnie Pion, NP  sertraline (ZOLOFT) 100 MG tablet Take 0.5 tablets (50 mg total) by mouth daily. After 1 week increase to 134m daily. 02/24/14   MHilton Sinclair MD  traMADol (ULTRAM) 50 MG tablet Take 1 tablet (50 mg total) by mouth every 6 (six) hours as needed. 05/20/15   Dezarae Mcclaran MBunnie Pion NP   BP 143/109 mmHg  Pulse 125  Temp(Src) 98.5 F (36.9 C) (Oral)  Resp 18  Ht '5\' 4"'  (1.626 m)  Wt 263 lb 8 oz (119.523 kg)  BMI 45.21 kg/m2  SpO2 96% Physical Exam  Constitutional: She is oriented to person, place, and time. She appears well-developed and well-nourished. No distress.  HENT:  Head: Normocephalic.  Nose: Rhinorrhea present. Right sinus exhibits maxillary sinus tenderness. Left sinus exhibits maxillary sinus tenderness.  Mouth/Throat: Uvula is midline and mucous membranes are normal. Posterior oropharyngeal erythema present. No oropharyngeal exudate.  Right TM, erythema. Left TM is bulging and erythematous; uvula midline.   Eyes: Conjunctivae and EOM are normal.  Neck: Neck supple.  Cardiovascular: Regular rhythm and normal heart sounds.   Tachycardic.   Pulmonary/Chest: Effort normal and breath sounds normal. No respiratory distress. She has no wheezes.  Abdominal: Soft. Bowel sounds are normal. There is  no tenderness.  No CVA tenderness.   Musculoskeletal: Normal range of motion.  Lymphadenopathy:    She has no cervical adenopathy.  Neurological: She is alert and oriented to person, place, and time. No cranial nerve deficit.  Skin: Skin is warm and dry.  Psychiatric: She has a normal mood and affect. Her behavior is normal.  Nursing note and vitals reviewed.   ED Course  Procedures  After pain medication the patient rates her pain as 4/10. Vital signs improved.   VS: BP 135/93 mmHg  Pulse 108  Temp(Src) 98.2 F (36.8 C) (Oral)  Resp 18  Ht '5\' 4"'  (1.626 m)  Wt 263 lb 8 oz (119.523 kg)  BMI 45.21 kg/m2  SpO2 97%  DIAGNOSTIC STUDIES: Oxygen Saturation is 96% on RA, adequate by my interpretation.    COORDINATION OF CARE: 7:58 PM Discussed treatment plan with pt at bedside and pt agreed to plan.  Will order pain medication and Zofran.   MDM  29 y.o. female with ear pain that has caused her to have nausea and headache, nasal congestion and low grade fever. Stable for d/c without mastoid tenderness, fever and does not appear toxic.  Will treat with antibiotics and she will follow up with her doctor in one week for recheck or sooner if symptoms worsen.   Final diagnoses:  Acute serous otitis media, recurrence not specified, unspecified laterality  Acute maxillary sinusitis, recurrence not specified   I personally performed the services described in this documentation, which was scribed in my presence. The recorded information has been reviewed and is accurate.   [place], [place] 05/21/15 Gem, MD 05/21/15 1450

## 2015-05-24 ENCOUNTER — Encounter: Payer: Self-pay | Admitting: Sports Medicine

## 2015-05-24 ENCOUNTER — Ambulatory Visit (INDEPENDENT_AMBULATORY_CARE_PROVIDER_SITE_OTHER): Payer: Medicaid Other | Admitting: Sports Medicine

## 2015-05-24 VITALS — BP 125/72 | HR 92 | Ht 64.0 in | Wt 263.0 lb

## 2015-05-24 DIAGNOSIS — M25562 Pain in left knee: Secondary | ICD-10-CM | POA: Diagnosis not present

## 2015-05-24 DIAGNOSIS — M25561 Pain in right knee: Secondary | ICD-10-CM

## 2015-05-24 NOTE — Progress Notes (Signed)
   Subjective:    Patient ID: Krystal [name], female    DOB: 04-14-1986, 29 y.o.   MRN: [id number]  HPI chief complaint: Bilateral knee pain  Very pleasant 29 year old female comes in today complaining of bilateral knee pain. Right knee pain began at the age of 29. She suffered an injury at that time and since then has had intermittent episodes of the knee wanting to give way. Pain is diffuse and localized to both the anterior and posterior knee. She states that she is able to go weeks sometimes without any symptoms but then the knee will feel like it wants to "shift". She does get swelling with this. Some radiating pain down the lateral aspect of her right lower leg. No prior right knee surgeries. No prior right knee x-rays. Patient did undergo physical therapy for her right knee about 4 years ago. In regards to the left knee she suffered a twisting injury earlier this year. Felt a pop at the time of the injury followed by significant swelling. Pain is diffuse and localized to both the anterior and posterior knee. X-rays were done which were unremarkable. Since then the knee has wanted to continue to give way. Occasional popping. No locking. No prior knee surgeries. No radiating pain into the left lower leg or foot.   Past medical history reviewed Medications reviewed Allergies reviewed    Review of Systems As above    Objective:   Physical Exam Obese. No acute distress. Awake alert and oriented 3. Vital signs reviewed.  Right knee: Full range of motion. No effusion. Positive patellar compression test. Minimal patellofemoral crepitus. Significant tethering of the patella laterally. No joint line tenderness. Negative McMurray's. Knee is stable to valgus and varus stressing. Negative anterior drawer, negative Lachman's. Negative posterior drawer. Good overall alignment. Skin intact. Neurovascular intact distally.  Left knee: Full range of motion. No effusion. Positive patellar compression  test. Minimal patellofemoral crepitus. Significant tethering of the patella laterally. No joint line tenderness. Negative McMurray's. Knee is stable to valgus and varus stressing. Positive Lachman's, positive anterior drawer. Negative posterior drawer. Good overall alignment. Skin is intact. Neurovascular intact distally.  Examination of her feet show good transverse and longitudinal arches. Walking without a limp.  X-rays of the left knee done in March of this year are reviewed. Nothing acute is seen.       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 There is no doubt some of her symptoms are originating from the patellofemoral joint but I'm concerned about an anterior cruciate ligament tear in the left knee. I will get an MRI to evaluate further. Patient will follow-up with me after the MRI to discuss both the x-rays and the MRI. We will delineate treatment based on those findings.

## 2015-05-31 ENCOUNTER — Ambulatory Visit
Admission: RE | Admit: 2015-05-31 | Discharge: 2015-05-31 | Disposition: A | Discharge status: Home / Self Care | Payer: Medicaid Other | Source: Ambulatory Visit | Attending: Sports Medicine | Admitting: Sports Medicine

## 2015-05-31 ENCOUNTER — Other Ambulatory Visit: Payer: Self-pay | Admitting: Sports Medicine

## 2015-05-31 DIAGNOSIS — M25561 Pain in right knee: Secondary | ICD-10-CM

## 2015-05-31 DIAGNOSIS — M25562 Pain in left knee: Principal | ICD-10-CM

## 2015-06-04 ENCOUNTER — Other Ambulatory Visit (HOSPITAL_COMMUNITY)
Admission: RE | Admit: 2015-06-04 | Discharge: 2015-06-04 | Disposition: A | Discharge status: Home / Self Care | Payer: Medicaid Other | Source: Ambulatory Visit | Attending: Family Medicine | Admitting: Family Medicine

## 2015-06-04 ENCOUNTER — Ambulatory Visit (INDEPENDENT_AMBULATORY_CARE_PROVIDER_SITE_OTHER): Payer: Medicaid Other | Admitting: Family Medicine

## 2015-06-04 ENCOUNTER — Encounter: Payer: Self-pay | Admitting: Family Medicine

## 2015-06-04 VITALS — BP 139/86 | HR 91 | Temp 97.6°F | Ht 64.0 in | Wt 267.0 lb

## 2015-06-04 DIAGNOSIS — B373 Candidiasis of vulva and vagina: Secondary | ICD-10-CM | POA: Insufficient documentation

## 2015-06-04 DIAGNOSIS — Z23 Encounter for immunization: Secondary | ICD-10-CM

## 2015-06-04 DIAGNOSIS — Z01419 Encounter for gynecological examination (general) (routine) without abnormal findings: Secondary | ICD-10-CM | POA: Insufficient documentation

## 2015-06-04 DIAGNOSIS — Z113 Encounter for screening for infections with a predominantly sexual mode of transmission: Secondary | ICD-10-CM | POA: Insufficient documentation

## 2015-06-04 DIAGNOSIS — Z124 Encounter for screening for malignant neoplasm of cervix: Secondary | ICD-10-CM

## 2015-06-04 DIAGNOSIS — N898 Other specified noninflammatory disorders of vagina: Secondary | ICD-10-CM

## 2015-06-04 DIAGNOSIS — B3731 Acute candidiasis of vulva and vagina: Secondary | ICD-10-CM | POA: Insufficient documentation

## 2015-06-04 LAB — POCT WET PREP (WET MOUNT): Clue Cells Wet Prep Whiff POC: NEGATIVE

## 2015-06-04 MED ORDER — FLUCONAZOLE 150 MG PO TABS: 150.0000 mg | ORAL_TABLET | Freq: Once | ORAL | Status: DC

## 2015-06-04 NOTE — Progress Notes (Signed)
    Subjective:  Krystal [name] is a 29 y.o. female who [facility] with a chief complaint of vaginal irritation and discharge.   HPI:  Vaginal Discharge Patient reports increased vaginal discharge for the past week. States that she thinks that she has a yeast infection. Started monistat which has helped some. Also endorses vaginal irritation and pruritis. Of note, started amoxicilin 2 weeks ago for a ear infection. No fould smelling discharge. No abdominal pain. No fevers or chills.  Currently in monogamous relationship. Had IUD placed in 2015. Noticed some spotting yesterday, but has not had menstrual period since having IUD placed.    ROS: Per HPI  PMH:  The following were reviewed and entered/updated in epic: Past Medical History  Diagnosis Date  . Diabetes mellitus   . Obesity   . Breast feeding status of mother    Patient Active Problem List   Diagnosis Date Noted  . Candidiasis of vulva and vagina 06/04/2015  . Healthcare maintenance 05/07/2015  . Bilateral knee pain 10/08/2014  . Acne 05/14/2014  . Alopecia 05/12/2014  . Encounter for insertion of mirena IUD 10/01/2013  . S/P C-section 08/18/2013  . GERD (gastroesophageal reflux disease) 08/30/2012  . HIDRADENITIS SUPPURATIVA 03/17/2009  . ECZEMA, ATOPIC 08/26/2007  . DEPRESSION, MILD 05/08/2007  . Diabetes mellitus (HCC) 10/04/2006  . OBESITY, NOS 10/04/2006  . TOBACCO DEPENDENCE 10/04/2006  . MIGRAINE, UNSPEC., W/O INTRACTABLE MIGRAINE 10/04/2006   Past Surgical History  Procedure Laterality Date  . Mouth surgery    . Cesarean section N/A 08/18/2013    Procedure: CESAREAN SECTION;  Surgeon: Reva Boresanya S Pratt, MD;  Location: [facility];  Service: Obstetrics;  Laterality: N/A;     Objective:  Physical Exam: BP 139/86 mmHg  Pulse 91  Temp(Src) 97.6 F (36.4 C) (Oral)  Ht 5\' 4"  (1.626 m)  Wt 267 lb (121.11 kg)  BMI 45.81 kg/m2  LMP 06/02/2015  Breastfeeding? No  Gen: NAD, resting  comfortably CV: RRR with no murmurs appreciated Lungs: NWOB, CTAB with no crackles, wheezes, or rhonchi GI: Normal bowel sounds present. Soft, Nontender, Nondistended. GU: Confluent, erythematous rash noted around vaginal opening. Scant amount of old blood and white discharged noted in vaginal vault. No CMT.  MSK: no edema, cyanosis, or clubbing noted Skin: warm, dry Neuro: grossly normal, moves all extremities Psych: Normal affect and thought content   Assessment/Plan:  Candidiasis of vulva and vagina Vaginal discharge likely candidiasis in setting of poorly controlled diabetes, recent antibiotics, and external rash consistent with candidiasis. Wet prep unremarkable, though possibly partially treated with OTC monistat. GC/CT sent though no signs or symptoms of cervicitis or PID. Will treat with oral diflucan. Return precautions reviewed.   Healthcare maintenance Pap test performed today.     Katina Degreealeb M. Jimmey RalphParker, MD [facility] Resident PGY-2 06/04/2015 4:45 PM

## 2015-06-04 NOTE — Assessment & Plan Note (Signed)
Pap test performed today

## 2015-06-04 NOTE — Assessment & Plan Note (Addendum)
Vaginal discharge likely candidiasis in setting of poorly controlled diabetes, recent antibiotics, and external rash consistent with candidiasis. Wet prep unremarkable, though possibly partially treated with OTC monistat. GC/CT sent though no signs or symptoms of cervicitis or PID. Will treat with oral diflucan. Return precautions reviewed.

## 2015-06-04 NOTE — Patient Instructions (Signed)
It looks like you have a yeast infection. We will prescribe you a dose of diflucan for this.  Your pap test takes a few days to come back. We will let you know the results early next week.  Please come back in 1-2 months to discuss your diabetes.  Take care,  Dr Jimmey RalphParker

## 2015-06-06 ENCOUNTER — Ambulatory Visit
Admission: RE | Admit: 2015-06-06 | Discharge: 2015-06-06 | Disposition: A | Discharge status: Home / Self Care | Payer: Medicaid Other | Source: Ambulatory Visit | Attending: Sports Medicine | Admitting: Sports Medicine

## 2015-06-06 DIAGNOSIS — M25562 Pain in left knee: Principal | ICD-10-CM

## 2015-06-06 DIAGNOSIS — M25561 Pain in right knee: Secondary | ICD-10-CM

## 2015-06-08 ENCOUNTER — Encounter: Payer: Self-pay | Admitting: Family Medicine

## 2015-06-08 ENCOUNTER — Ambulatory Visit (INDEPENDENT_AMBULATORY_CARE_PROVIDER_SITE_OTHER): Payer: Medicaid Other | Admitting: Sports Medicine

## 2015-06-08 ENCOUNTER — Encounter: Payer: Self-pay | Admitting: Sports Medicine

## 2015-06-08 VITALS — BP 128/85 | Ht 64.0 in | Wt 267.0 lb

## 2015-06-08 DIAGNOSIS — S83512D Sprain of anterior cruciate ligament of left knee, subsequent encounter: Secondary | ICD-10-CM | POA: Diagnosis not present

## 2015-06-08 DIAGNOSIS — M25562 Pain in left knee: Secondary | ICD-10-CM

## 2015-06-08 LAB — CYTOLOGY - PAP

## 2015-06-08 NOTE — Progress Notes (Deleted)
   Subjective:    Patient ID: Krystal [name], female    DOB: 1986-04-04, 29 y.o.   MRN: [id number]  HPI    Review of Systems     Objective:   Physical Exam        Assessment & Plan:

## 2015-06-08 NOTE — Progress Notes (Signed)
Patient ID: Krystal [name], female   DOB: 17-Mar-1986, 29 y.o.   MRN: [id number]   Patient comes in today to discuss x-rays and MRI findings of the left knee. 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 Plain films also show some mild early medial compartmental DJD. Her primary symptom is knee instability which will cause her pain. Therefore, I will refer the patient to Dr. Eulah PontMurphy to discuss anterior cruciate ligament reconstruction, meniscectomy, and chondroplasty. Further treatment for her left knee pain will be per the discretion of Dr. Eulah PontMurphy and the patient will follow-up with me when necessary.

## 2015-06-14 NOTE — H&P (Signed)
PREOPERATIVE H&P  Chief Complaint: OTHER SPONTANEOUS DISRUPTION OF ANTERIOR CRUCIATE LIGAMENT OF LEFT KNEE, OTHER TEAR OF MEDIAL MENISCUS CURRENT INJURY LEFT KNEE, OTHER TEAR OF MEDIAL MENISCUS CURRENT INJURY LEFT KNEE  HPI: Krystal [name] is a 29 y.o. female who presents for preoperative history and physical with a diagnosis of OTHER SPONTANEOUS DISRUPTION OF ANTERIOR CRUCIATE LIGAMENT OF LEFT KNEE, OTHER TEAR OF MEDIAL MENISCUS CURRENT INJURY LEFT KNEE, OTHER TEAR OF MEDIAL MENISCUS CURRENT INJURY LEFT KNEE. Symptoms are rated as moderate to severe, and have been worsening.  This is significantly impairing activities of daily living.  She has elected for surgical management.   Past Medical History  Diagnosis Date  . Diabetes mellitus   . Obesity   . Breast feeding status of mother    Past Surgical History  Procedure Laterality Date  . Mouth surgery    . Cesarean section N/A 08/18/2013    Procedure: CESAREAN SECTION;  Surgeon: Donnamae Jude, MD;  Location: [facility];  Service: Obstetrics;  Laterality: N/A;   Social History   Social History  . Marital Status: Married    Spouse Name: N/A  . Number of Children: N/A  . Years of Education: N/A   Social History Main Topics  . Smoking status: Current Every Day Smoker -- 0.25 packs/day for 6 years    Types: Cigarettes  . Smokeless tobacco: Never Used     Comment: trying to quitt  . Alcohol Use: No  . Drug Use: No  . Sexual Activity: Yes    Birth Control/ Protection: IUD   Other Topics Concern  . Not on file   Social History Narrative   Family History  Problem Relation Age of Onset  . Asthma Mother   . Diabetes Mother   . Alcohol abuse Mother    Allergies  Allergen Reactions  . Vicodin [Hydrocodone-Acetaminophen] Rash   Prior to Admission medications   Medication Sig Start Date End Date Taking? Authorizing Provider  ACCU-CHEK FASTCLIX LANCETS MISC CHECK BLOOD SUGAR ONCE DAILY EVERY MORNING and if signs of high or low  sugar 03/19/14   Hilton Sinclair, MD  Blood Glucose Monitoring Suppl (BLOOD GLUCOSE MONITOR SYSTEM) W/DEVICE KIT Use with Accu-check lancets and strips to test blood sugar daily (fasting and if any symptoms of high or low sugar). 05/07/15   Vivi Barrack, MD  dicyclomine (BENTYL) 20 MG tablet Take 1 tablet (20 mg total) by mouth every 6 (six) hours as needed for spasms (abdominal cramping). 08/02/14   Francine Graven, DO  fluconazole (DIFLUCAN) 150 MG tablet Take 1 tablet (150 mg total) by mouth once. 06/04/15   Vivi Barrack, MD  glucose blood (ACCU-CHEK AVIVA) test strip Use as instructed 03/19/14   Hilton Sinclair, MD  glyBURIDE (DIABETA) 2.5 MG tablet Take 1 tablet (2.5 mg total) by mouth 2 (two) times daily. 05/07/15   Vivi Barrack, MD  ibuprofen (ADVIL,MOTRIN) 600 MG tablet Take 1 tablet (600 mg total) by mouth every 6 (six) hours as needed. 11/01/14   Jeannett Senior, PA-C  levonorgestrel (MIRENA) 20 MCG/24HR IUD 1 each by Intrauterine route once.    Historical Provider, MD  Lidocaine-Hydrocortisone Ace 2-2 % KIT Place 1 applicator rectally 2 (two) times daily. 12/04/14   Mariel Aloe, MD  metFORMIN (GLUCOPHAGE-XR) 500 MG 24 hr tablet Take 2 tablets (1,000 mg total) by mouth daily with breakfast. 05/07/15   Vivi Barrack, MD  ondansetron (ZOFRAN ODT) 4 MG disintegrating tablet Take 1  tablet (4 mg total) by mouth every 8 (eight) hours as needed for nausea or vomiting. 08/02/14   Francine Graven, DO  pseudoephedrine (SUDAFED) 30 MG tablet Take 1 tablet (30 mg total) by mouth every 6 (six) hours as needed for congestion. 05/20/15   Hope Bunnie Pion, NP  sertraline (ZOLOFT) 100 MG tablet Take 0.5 tablets (50 mg total) by mouth daily. After 1 week increase to 15m daily. 02/24/14   MHilton Sinclair MD  traMADol (ULTRAM) 50 MG tablet Take 1 tablet (50 mg total) by mouth every 6 (six) hours as needed. 05/20/15   Hope MBunnie Pion NP     Positive ROS: All other systems have been reviewed  and were otherwise negative with the exception of those mentioned in the HPI and as above.  Physical Exam: General: Alert, no acute distress Cardiovascular: No pedal edema Respiratory: No cyanosis, no use of accessory musculature GI: No organomegaly, abdomen is soft and non-tender Skin: No lesions in the area of chief complaint Neurologic: Sensation intact distally Psychiatric: Patient is competent for consent with normal mood and affect Lymphatic: No axillary or cervical lymphadenopathy  MUSCULOSKELETAL:  L knee has no erythema.  Mild effusion.  Limited ROM due to swelling and pain.  Laxity with lauchman.  Sensation intact with 2+ distal pulses.   Assessment: OTHER SPONTANEOUS DISRUPTION OF ANTERIOR CRUCIATE LIGAMENT OF LEFT KNEE, OTHER TEAR OF MEDIAL MENISCUS CURRENT INJURY LEFT KNEE, OTHER TEAR OF MEDIAL MENISCUS CURRENT INJURY LEFT KNEE  Plan: Plan for Procedure(s): LEFT KNEE SCOPE WITH MEDIAL MENISECTOMY VERSES REPAIR LEFT ANTERIOR CRUCIATE LIGAMENT (ACL) REPAIR WITH ARTHREX GRAFT LINK ALLOGRAFT  The risks benefits and alternatives were discussed with the patient including but not limited to the risks of nonoperative treatment, versus surgical intervention including infection, bleeding, nerve injury,  blood clots, cardiopulmonary complications, morbidity, mortality, among others, and they were willing to proceed.   KGae Dry PA-C  06/14/2015 8:28 AM

## 2015-06-15 ENCOUNTER — Encounter (HOSPITAL_BASED_OUTPATIENT_CLINIC_OR_DEPARTMENT_OTHER): Payer: Self-pay | Admitting: *Deleted

## 2015-06-15 NOTE — Pre-Procedure Instructions (Signed)
To come for BMET, EKG and anesthesia airway evaluation 

## 2015-06-17 ENCOUNTER — Encounter (HOSPITAL_BASED_OUTPATIENT_CLINIC_OR_DEPARTMENT_OTHER)
Admission: RE | Admit: 2015-06-17 | Discharge: 2015-06-17 | Disposition: A | Discharge status: Home / Self Care | Payer: Medicaid Other | Source: Ambulatory Visit | Attending: Orthopedic Surgery | Admitting: Orthopedic Surgery

## 2015-06-17 ENCOUNTER — Other Ambulatory Visit: Payer: Self-pay

## 2015-06-17 DIAGNOSIS — E669 Obesity, unspecified: Secondary | ICD-10-CM | POA: Diagnosis not present

## 2015-06-17 DIAGNOSIS — Z6841 Body Mass Index (BMI) 40.0 and over, adult: Secondary | ICD-10-CM | POA: Diagnosis not present

## 2015-06-17 DIAGNOSIS — X58XXXA Exposure to other specified factors, initial encounter: Secondary | ICD-10-CM | POA: Diagnosis not present

## 2015-06-17 DIAGNOSIS — Z5309 Procedure and treatment not carried out because of other contraindication: Secondary | ICD-10-CM | POA: Diagnosis not present

## 2015-06-17 DIAGNOSIS — S83242A Other tear of medial meniscus, current injury, left knee, initial encounter: Secondary | ICD-10-CM | POA: Diagnosis not present

## 2015-06-17 DIAGNOSIS — Z01818 Encounter for other preprocedural examination: Secondary | ICD-10-CM | POA: Insufficient documentation

## 2015-06-17 DIAGNOSIS — M23612 Other spontaneous disruption of anterior cruciate ligament of left knee: Secondary | ICD-10-CM | POA: Diagnosis not present

## 2015-06-17 DIAGNOSIS — E119 Type 2 diabetes mellitus without complications: Secondary | ICD-10-CM | POA: Diagnosis not present

## 2015-06-17 LAB — BASIC METABOLIC PANEL
Anion gap: 8 (ref 5–15)
BUN: 10 mg/dL (ref 6–20)
CALCIUM: 8.8 mg/dL — AB (ref 8.9–10.3)
CO2: 25 mmol/L (ref 22–32)
Chloride: 100 mmol/L — ABNORMAL LOW (ref 101–111)
Creatinine, Ser: 0.55 mg/dL (ref 0.44–1.00)
GFR calc Af Amer: >60 mL/min (ref >60)
GLUCOSE: 330 mg/dL — AB (ref 65–99)
Potassium: 4 mmol/L (ref 3.5–5.1)
Sodium: 133 mmol/L — ABNORMAL LOW (ref 135–145)

## 2015-06-17 NOTE — Progress Notes (Signed)
Spoke with dr. Boneta Lucksmasagee about pt's glucose-330, will check CBG on day of surgery and reevaluate.

## 2015-06-18 ENCOUNTER — Encounter (HOSPITAL_BASED_OUTPATIENT_CLINIC_OR_DEPARTMENT_OTHER)
Admission: RE | Disposition: A | Discharge status: Home / Self Care | Payer: Self-pay | Source: Ambulatory Visit | Attending: Orthopedic Surgery

## 2015-06-18 ENCOUNTER — Encounter (HOSPITAL_BASED_OUTPATIENT_CLINIC_OR_DEPARTMENT_OTHER): Payer: Self-pay | Admitting: Anesthesiology

## 2015-06-18 ENCOUNTER — Ambulatory Visit (HOSPITAL_BASED_OUTPATIENT_CLINIC_OR_DEPARTMENT_OTHER)
Admission: RE | Admit: 2015-06-18 | Discharge: 2015-06-18 | Disposition: A | Discharge status: Home / Self Care | Payer: Medicaid Other | Source: Ambulatory Visit | Attending: Orthopedic Surgery | Admitting: Orthopedic Surgery

## 2015-06-18 DIAGNOSIS — Z5309 Procedure and treatment not carried out because of other contraindication: Secondary | ICD-10-CM | POA: Insufficient documentation

## 2015-06-18 DIAGNOSIS — E119 Type 2 diabetes mellitus without complications: Secondary | ICD-10-CM | POA: Insufficient documentation

## 2015-06-18 DIAGNOSIS — M23612 Other spontaneous disruption of anterior cruciate ligament of left knee: Secondary | ICD-10-CM | POA: Insufficient documentation

## 2015-06-18 DIAGNOSIS — S83242A Other tear of medial meniscus, current injury, left knee, initial encounter: Secondary | ICD-10-CM | POA: Insufficient documentation

## 2015-06-18 DIAGNOSIS — E669 Obesity, unspecified: Secondary | ICD-10-CM | POA: Insufficient documentation

## 2015-06-18 DIAGNOSIS — X58XXXA Exposure to other specified factors, initial encounter: Secondary | ICD-10-CM | POA: Insufficient documentation

## 2015-06-18 DIAGNOSIS — Z6841 Body Mass Index (BMI) 40.0 and over, adult: Secondary | ICD-10-CM | POA: Insufficient documentation

## 2015-06-18 HISTORY — DX: Sprain of anterior cruciate ligament of left knee, initial encounter: S83.512A

## 2015-06-18 HISTORY — DX: Other tear of medial meniscus, current injury, unspecified knee, initial encounter: S83.249A

## 2015-06-18 HISTORY — DX: Type 2 diabetes mellitus without complications: E11.9

## 2015-06-18 LAB — GLUCOSE, CAPILLARY: GLUCOSE-CAPILLARY: 235 mg/dL — AB (ref 65–99)

## 2015-06-18 SURGERY — CANCELLED PROCEDURE

## 2015-06-18 MED ORDER — FENTANYL CITRATE (PF) 100 MCG/2ML IJ SOLN: 50.0000 ug | INTRAMUSCULAR | Status: DC | PRN

## 2015-06-18 MED ORDER — MIDAZOLAM HCL 2 MG/2ML IJ SOLN: 1.0000 mg | INTRAMUSCULAR | Status: DC | PRN

## 2015-06-18 MED ORDER — CEFAZOLIN SODIUM-DEXTROSE 2-3 GM-% IV SOLR
INTRAVENOUS | Status: AC
  Filled 2015-06-18: qty 50

## 2015-06-18 MED ORDER — GLYCOPYRROLATE 0.2 MG/ML IJ SOLN
INTRAMUSCULAR | Status: AC
  Filled 2015-06-18: qty 1

## 2015-06-18 MED ORDER — CHLORHEXIDINE GLUCONATE 4 % EX LIQD: 60.0000 mL | Freq: Once | CUTANEOUS | Status: DC

## 2015-06-18 MED ORDER — SUCCINYLCHOLINE CHLORIDE 20 MG/ML IJ SOLN
INTRAMUSCULAR | Status: AC
  Filled 2015-06-18: qty 1

## 2015-06-18 MED ORDER — PHENYLEPHRINE HCL 10 MG/ML IJ SOLN
INTRAMUSCULAR | Status: AC
  Filled 2015-06-18: qty 1

## 2015-06-18 MED ORDER — LACTATED RINGERS IV SOLN
INTRAVENOUS | Status: DC
  Administered 2015-06-18: 13:00:00 via INTRAVENOUS

## 2015-06-18 MED ORDER — DEXAMETHASONE SODIUM PHOSPHATE 10 MG/ML IJ SOLN
INTRAMUSCULAR | Status: AC
  Filled 2015-06-18: qty 1

## 2015-06-18 MED ORDER — FENTANYL CITRATE (PF) 100 MCG/2ML IJ SOLN
INTRAMUSCULAR | Status: AC
  Filled 2015-06-18: qty 2

## 2015-06-18 MED ORDER — MIDAZOLAM HCL 2 MG/2ML IJ SOLN
INTRAMUSCULAR | Status: AC
  Filled 2015-06-18: qty 2

## 2015-06-18 MED ORDER — HYDROCODONE-ACETAMINOPHEN 5-325 MG PO TABS: 1.0000 | ORAL_TABLET | Freq: Four times a day (QID) | ORAL | Status: DC | PRN

## 2015-06-18 MED ORDER — ATROPINE SULFATE 0.4 MG/ML IJ SOLN
INTRAMUSCULAR | Status: AC
  Filled 2015-06-18: qty 1

## 2015-06-18 MED ORDER — MIDAZOLAM HCL 2 MG/2ML IJ SOLN
INTRAMUSCULAR | Status: AC
  Filled 2015-06-18: qty 4

## 2015-06-18 MED ORDER — PROPOFOL 10 MG/ML IV BOLUS
INTRAVENOUS | Status: AC
  Filled 2015-06-18: qty 20

## 2015-06-18 MED ORDER — CEFAZOLIN SODIUM-DEXTROSE 2-3 GM-% IV SOLR: 2.0000 g | INTRAVENOUS | Status: DC

## 2015-06-18 MED ORDER — POTASSIUM CHLORIDE IN NACL 20-0.45 MEQ/L-% IV SOLN: INTRAVENOUS | Status: DC

## 2015-06-18 MED ORDER — EPHEDRINE SULFATE 50 MG/ML IJ SOLN
INTRAMUSCULAR | Status: AC
  Filled 2015-06-18: qty 1

## 2015-06-18 MED ORDER — ONDANSETRON HCL 4 MG/2ML IJ SOLN
INTRAMUSCULAR | Status: AC
  Filled 2015-06-18: qty 2

## 2015-06-18 MED ORDER — LIDOCAINE HCL (CARDIAC) 20 MG/ML IV SOLN
INTRAVENOUS | Status: AC
  Filled 2015-06-18: qty 5

## 2015-06-18 MED ORDER — GLYCOPYRROLATE 0.2 MG/ML IJ SOLN: 0.2000 mg | Freq: Once | INTRAMUSCULAR | Status: DC | PRN

## 2015-06-18 MED ORDER — ACETAMINOPHEN 500 MG PO TABS: 1000.0000 mg | ORAL_TABLET | Freq: Once | ORAL | Status: DC

## 2015-06-18 MED ORDER — FENTANYL CITRATE (PF) 100 MCG/2ML IJ SOLN
INTRAMUSCULAR | Status: AC
  Filled 2015-06-18: qty 4

## 2015-06-18 MED ORDER — TRAMADOL HCL 50 MG PO TABS: 50.0000 mg | ORAL_TABLET | Freq: Four times a day (QID) | ORAL | Status: DC | PRN

## 2015-06-18 MED ORDER — SCOPOLAMINE 1 MG/3DAYS TD PT72: 1.0000 | MEDICATED_PATCH | Freq: Once | TRANSDERMAL | Status: DC | PRN

## 2015-06-18 MED ORDER — CEPHALEXIN 500 MG PO CAPS: 500.0000 mg | ORAL_CAPSULE | Freq: Three times a day (TID) | ORAL | Status: DC

## 2015-06-18 SURGICAL SUPPLY — 104 items
BANDAGE ELASTIC 6 VELCRO ST LF (GAUZE/BANDAGES/DRESSINGS) ×4 IMPLANT
BANDAGE ESMARK 6X9 LF (GAUZE/BANDAGES/DRESSINGS) ×2 IMPLANT
BLADE 4.2CUDA (BLADE) IMPLANT
BLADE CUDA 5.5 (BLADE) IMPLANT
BLADE CUDA GRT WHITE 3.5 (BLADE) IMPLANT
BLADE CUTTER GATOR 3.5 (BLADE) ×1 IMPLANT
BLADE CUTTER MENIS 5.5 (BLADE) IMPLANT
BLADE GREAT WHITE 4.2 (BLADE) IMPLANT
BLADE GREAT WHITE 4.2MM (BLADE)
BLADE SURG 15 STRL LF DISP TIS (BLADE) ×2 IMPLANT
BLADE SURG 15 STRL SS (BLADE) ×4
BNDG CMPR 9X6 STRL LF SNTH (GAUZE/BANDAGES/DRESSINGS) ×2
BNDG ESMARK 6X9 LF (GAUZE/BANDAGES/DRESSINGS) ×4
BUR OVAL 4.0 (BURR) IMPLANT
BUR OVAL 6.0 (BURR) ×1 IMPLANT
CHLORAPREP W/TINT 26ML (MISCELLANEOUS) ×4 IMPLANT
CLOSURE STERI-STRIP 1/2X4 (GAUZE/BANDAGES/DRESSINGS) ×1
CLSR STERI-STRIP ANTIMIC 1/2X4 (GAUZE/BANDAGES/DRESSINGS) ×3 IMPLANT
COVER BACK TABLE 60X90IN (DRAPES) ×4 IMPLANT
CUFF TOURNIQUET SINGLE 34IN LL (TOURNIQUET CUFF) IMPLANT
CUTTER FLIP II 9.5MM (INSTRUMENTS) IMPLANT
CUTTER KNOT PUSHER 2-0 FIBERWI (INSTRUMENTS) IMPLANT
CUTTER MENISCUS  4.2MM (BLADE)
CUTTER MENISCUS 4.2MM (BLADE) IMPLANT
DECANTER SPIKE VIAL GLASS SM (MISCELLANEOUS) IMPLANT
DRAPE ARTHROSCOPY W/POUCH 90 (DRAPES) ×4 IMPLANT
DRAPE OEC MINIVIEW 54X84 (DRAPES) ×4 IMPLANT
DRAPE U 20/CS (DRAPES) ×4 IMPLANT
DRAPE U-SHAPE 47X51 STRL (DRAPES) ×4 IMPLANT
DRILL FLIPCUTTER II 10.5MM (CUTTER) IMPLANT
DRILL FLIPCUTTER II 10MM (CUTTER) IMPLANT
DRILL FLIPCUTTER II 7.0MM (INSTRUMENTS) IMPLANT
DRILL FLIPCUTTER II 7.5MM (MISCELLANEOUS) IMPLANT
DRILL FLIPCUTTER II 8.0MM (INSTRUMENTS) IMPLANT
DRILL FLIPCUTTER II 8.5MM (INSTRUMENTS) IMPLANT
DRILL FLIPCUTTER II 9.0MM (INSTRUMENTS) IMPLANT
DRSG EMULSION OIL 3X3 NADH (GAUZE/BANDAGES/DRESSINGS) ×4 IMPLANT
DRSG PAD ABDOMINAL 8X10 ST (GAUZE/BANDAGES/DRESSINGS) ×4 IMPLANT
ELECT REM PT RETURN 9FT ADLT (ELECTROSURGICAL) ×4
ELECTRODE REM PT RTRN 9FT ADLT (ELECTROSURGICAL) ×2 IMPLANT
FIBERSTICK 2 (SUTURE) IMPLANT
FLIP CUTTER II 7.0MM (INSTRUMENTS)
FLIPCUTTER II 10.5MM (CUTTER)
FLIPCUTTER II 10MM (CUTTER)
FLIPCUTTER II 7.5MM (MISCELLANEOUS)
FLIPCUTTER II 8.0MM (INSTRUMENTS)
FLIPCUTTER II 8.5MM (INSTRUMENTS)
FLIPCUTTER II 9.0MM (INSTRUMENTS)
GAUZE SPONGE 4X4 12PLY STRL (GAUZE/BANDAGES/DRESSINGS) ×8 IMPLANT
GLOVE BIO SURGEON STRL SZ7 (GLOVE) ×4 IMPLANT
GLOVE BIO SURGEON STRL SZ7.5 (GLOVE) ×4 IMPLANT
GLOVE BIO SURGEON STRL SZ8 (GLOVE) ×4 IMPLANT
GLOVE BIOGEL PI IND STRL 7.0 (GLOVE) ×2 IMPLANT
GLOVE BIOGEL PI IND STRL 8 (GLOVE) ×2 IMPLANT
GLOVE BIOGEL PI IND STRL 8.5 (GLOVE) IMPLANT
GLOVE BIOGEL PI INDICATOR 7.0 (GLOVE) ×2
GLOVE BIOGEL PI INDICATOR 8 (GLOVE) ×2
GLOVE BIOGEL PI INDICATOR 8.5 (GLOVE)
GOWN STRL REUS W/ TWL LRG LVL3 (GOWN DISPOSABLE) ×6 IMPLANT
GOWN STRL REUS W/ TWL XL LVL3 (GOWN DISPOSABLE) ×2 IMPLANT
GOWN STRL REUS W/TWL LRG LVL3 (GOWN DISPOSABLE) ×12
GOWN STRL REUS W/TWL XL LVL3 (GOWN DISPOSABLE) ×4
GUIDEPIN REAMER CUTTER 11MM (INSTRUMENTS) IMPLANT
IMMOBILIZER KNEE 22 UNIV (SOFTGOODS) IMPLANT
IMMOBILIZER KNEE 24 THIGH 36 (MISCELLANEOUS) IMPLANT
IMMOBILIZER KNEE 24 UNIV (MISCELLANEOUS)
KIT TRANSTIBIAL (DISPOSABLE) IMPLANT
KNEE WRAP E Z 3 GEL PACK (MISCELLANEOUS) ×1 IMPLANT
LOOP 2 FIBERLINK CLOSED (SUTURE) IMPLANT
MANIFOLD NEPTUNE II (INSTRUMENTS) ×4 IMPLANT
NS IRRIG 1000ML POUR BTL (IV SOLUTION) ×4 IMPLANT
PACK ARTHROSCOPY DSU (CUSTOM PROCEDURE TRAY) ×4 IMPLANT
PACK BASIN DAY SURGERY FS (CUSTOM PROCEDURE TRAY) ×4 IMPLANT
PAD CAST 4YDX4 CTTN HI CHSV (CAST SUPPLIES) ×2 IMPLANT
PADDING CAST COTTON 4X4 STRL (CAST SUPPLIES) ×4
PADDING CAST COTTON 6X4 STRL (CAST SUPPLIES) ×4 IMPLANT
PENCIL BUTTON HOLSTER BLD 10FT (ELECTRODE) IMPLANT
PIN DRILL ACL TIGHTROPE 4MM (PIN) IMPLANT
SET ARTHROSCOPY TUBING (MISCELLANEOUS)
SET ARTHROSCOPY TUBING LN (MISCELLANEOUS) ×1 IMPLANT
SLEEVE SCD COMPRESS KNEE MED (MISCELLANEOUS) IMPLANT
SPONGE LAP 4X18 X RAY DECT (DISPOSABLE) IMPLANT
SUCTION FRAZIER TIP 10 FR DISP (SUCTIONS) IMPLANT
SUT 2 FIBERLOOP 20 STRT BLUE (SUTURE)
SUT ETHILON 3 0 PS 1 (SUTURE) ×1 IMPLANT
SUT FIBERWIRE #2 38 T-5 BLUE (SUTURE)
SUT FIBERWIRE 2-0 18 17.9 3/8 (SUTURE)
SUT MNCRL AB 4-0 PS2 18 (SUTURE) IMPLANT
SUT MON AB 2-0 CT1 36 (SUTURE) ×1 IMPLANT
SUT VIC AB 2-0 SH 27 (SUTURE)
SUT VIC AB 2-0 SH 27XBRD (SUTURE) IMPLANT
SUT VIC AB 3-0 SH 27 (SUTURE)
SUT VIC AB 3-0 SH 27X BRD (SUTURE) IMPLANT
SUT VICRYL 4-0 PS2 18IN ABS (SUTURE) IMPLANT
SUTURE 2 FIBERLOOP 20 STRT BLU (SUTURE) IMPLANT
SUTURE FIBERWR #2 38 T-5 BLUE (SUTURE) IMPLANT
SUTURE FIBERWR 2-0 18 17.9 3/8 (SUTURE) IMPLANT
SUTURE TIGERSTICK 2 TIGERWIR 2 (MISCELLANEOUS) IMPLANT
TAPE CLOTH 3X10 TAN LF (GAUZE/BANDAGES/DRESSINGS) ×4 IMPLANT
TIGERSTICK 2 TIGERWIRE 2 (MISCELLANEOUS)
TOWEL OR 17X24 6PK STRL BLUE (TOWEL DISPOSABLE) ×8 IMPLANT
TOWEL OR NON WOVEN STRL DISP B (DISPOSABLE) ×4 IMPLANT
WAND STAR VAC 90 (SURGICAL WAND) ×1 IMPLANT
WATER STERILE IRR 1000ML POUR (IV SOLUTION) ×4 IMPLANT

## 2015-06-18 NOTE — Interval H&P Note (Signed)
History and Physical Interval Note:  06/18/2015 12:35 PM  Adysen Eudelia BunchM Jewel  has presented today for surgery, with the diagnosis of OTHER SPONTANEOUS DISRUPTION OF ANTERIOR CRUCIATE LIGAMENT OF LEFT KNEE, OTHER TEAR OF MEDIAL MENISCUS CURRENT INJURY LEFT KNEE, OTHER TEAR OF MEDIAL MENISCUS CURRENT INJURY LEFT KNEE  The various methods of treatment have been discussed with the patient and family. After consideration of risks, benefits and other options for treatment, the patient has consented to  Procedure(s) with comments: LEFT KNEE SCOPE WITH MEDIAL MENISCECTOMY VERSUS REPAIR (Left) - ANESTHESIA:  GENERAL, PRE/POST OP SCALENE LEFT ANTERIOR CRUCIATE LIGAMENT (ACL) REPAIR WITH ARTHREX GRAFT LINK ALLOGRAFT (Left) as a surgical intervention .  The patient's history has been reviewed, patient examined, no change in status, stable for surgery.  I have reviewed the patient's chart and labs.  Questions were answered to the patient's satisfaction.     Lashaunda Schild D

## 2015-06-18 NOTE — Progress Notes (Signed)
Procedure cancelled due to sore on pt's left knee, will wait until healed and reschedule surgery.

## 2015-06-22 ENCOUNTER — Emergency Department (HOSPITAL_COMMUNITY)
Admission: EM | Admit: 2015-06-22 | Discharge: 2015-06-22 | Disposition: A | Discharge status: Home / Self Care | Payer: Medicaid Other | Attending: Emergency Medicine | Admitting: Emergency Medicine

## 2015-06-22 ENCOUNTER — Encounter (HOSPITAL_COMMUNITY): Payer: Self-pay | Admitting: Emergency Medicine

## 2015-06-22 DIAGNOSIS — S61032A Puncture wound without foreign body of left thumb without damage to nail, initial encounter: Secondary | ICD-10-CM | POA: Diagnosis not present

## 2015-06-22 DIAGNOSIS — F17210 Nicotine dependence, cigarettes, uncomplicated: Secondary | ICD-10-CM | POA: Diagnosis not present

## 2015-06-22 DIAGNOSIS — D849 Immunodeficiency, unspecified: Secondary | ICD-10-CM | POA: Diagnosis not present

## 2015-06-22 DIAGNOSIS — E669 Obesity, unspecified: Secondary | ICD-10-CM | POA: Diagnosis not present

## 2015-06-22 DIAGNOSIS — Y9289 Other specified places as the place of occurrence of the external cause: Secondary | ICD-10-CM | POA: Diagnosis not present

## 2015-06-22 DIAGNOSIS — Z792 Long term (current) use of antibiotics: Secondary | ICD-10-CM | POA: Insufficient documentation

## 2015-06-22 DIAGNOSIS — Y998 Other external cause status: Secondary | ICD-10-CM | POA: Insufficient documentation

## 2015-06-22 DIAGNOSIS — E119 Type 2 diabetes mellitus without complications: Secondary | ICD-10-CM | POA: Diagnosis not present

## 2015-06-22 DIAGNOSIS — W268XXA Contact with other sharp object(s), not elsewhere classified, initial encounter: Secondary | ICD-10-CM | POA: Insufficient documentation

## 2015-06-22 DIAGNOSIS — S61011A Laceration without foreign body of right thumb without damage to nail, initial encounter: Secondary | ICD-10-CM | POA: Diagnosis present

## 2015-06-22 DIAGNOSIS — Y9389 Activity, other specified: Secondary | ICD-10-CM | POA: Insufficient documentation

## 2015-06-22 DIAGNOSIS — S61411A Laceration without foreign body of right hand, initial encounter: Secondary | ICD-10-CM

## 2015-06-22 MED ORDER — LIDOCAINE HCL (PF) 1 % IJ SOLN
5.0000 mL | Freq: Once | INTRAMUSCULAR | Status: AC
  Administered 2015-06-22: 5 mL
  Filled 2015-06-22: qty 5

## 2015-06-22 NOTE — ED Provider Notes (Signed)
CSN: [id number]     Arrival date & time 06/22/15  2044 History  By signing my name below, I, Emmanuella Mensah, attest that this documentation has been prepared under the direction and in the presence of Tali Cleaves, PA-C. Electronically Signed: Judithann Sauger, ED Scribe. 06/22/2015. 9:18 PM.    Chief Complaint  Patient presents with  . Laceration   The history is provided by the patient. No language interpreter was used.   HPI Comments: Krystal [name] is a 29 y.o. female with a hx of DM II and obesity who presents to the Emergency Department complaining of a right thumb laceration s/p right thumb injury that occurred PTA. She reports that she was using an apple corer when the apparatus broke and it sliced her hand. She reports associated mild tingling around the site of laceration.She states that she cleaned her thumb PTA and there is no active bleeding. Pt had a tetanus vaccine 07/2013.  She denies any fever, chills, abdominal pain, n/v/d or weakness. She states that she was scheduled for a knee surgery last week but had it postponed since she has a wound on her left leg and the surgeon is worried about her hx of DM with a risk of infection during the surgery. Pt was placed on Keflex 4 days ago by her Orthopedics when her surgery was cancelled but pt got the prescription and started it today.   Surgeon: Dr. Percell Miller   Past Medical History  Diagnosis Date  . Obesity   . Non-insulin dependent type 2 diabetes mellitus ([place])   . Left ACL tear 06/2015  . Medial meniscus tear 06/2015    left knee  . Runny nose 06/15/2015    clear drainage  . Heartburn     occasional - TUMS as needed  . Abrasion of left knee 06/15/2015   Past Surgical History  Procedure Laterality Date  . Cesarean section N/A 08/18/2013    Procedure: CESAREAN SECTION;  Surgeon: Donnamae Jude, MD;  Location: [facility];  Service: Obstetrics;  Laterality: N/A;  . Excision bone cyst      gum   Family History  Problem  Relation Age of Onset  . Asthma Mother   . Diabetes Mother   . Alcohol abuse Mother    Social History  Substance Use Topics  . Smoking status: Current Every Day Smoker -- 0.50 packs/day for 10 years    Types: Cigarettes  . Smokeless tobacco: Never Used  . Alcohol Use: Yes     Comment: occasionally   OB History    Gravida Para Term Preterm AB TAB SAB Ectopic Multiple Living   '2 2 2       2     ' Review of Systems  Constitutional: Negative for fever and chills.  Musculoskeletal: Negative for myalgias and arthralgias.  Skin: Positive for wound. Negative for color change and pallor.  Allergic/Immunologic: Positive for immunocompromised state (diabetic ).  Neurological: Negative for weakness.  Hematological: Does not bruise/bleed easily.  Psychiatric/Behavioral: Negative for self-injury.      Allergies  Vicodin  Home Medications   Prior to Admission medications   Medication Sig Start Date End Date Taking? Authorizing Provider  ACCU-CHEK FASTCLIX LANCETS MISC CHECK BLOOD SUGAR ONCE DAILY EVERY MORNING and if signs of high or low sugar 03/19/14   Hilton Sinclair, MD  Blood Glucose Monitoring Suppl (BLOOD GLUCOSE MONITOR SYSTEM) W/DEVICE KIT Use with Accu-check lancets and strips to test blood sugar daily (fasting and if any symptoms  of high or low sugar). 05/07/15   Vivi Barrack, MD  cephALEXin (KEFLEX) 500 MG capsule Take 1 capsule (500 mg total) by mouth 3 (three) times daily. 06/18/15   Brittney Claiborne Billings, PA-C  glucose blood (ACCU-CHEK AVIVA) test strip Use as instructed 03/19/14   Hilton Sinclair, MD  glyBURIDE (DIABETA) 2.5 MG tablet Take 1 tablet (2.5 mg total) by mouth 2 (two) times daily. 05/07/15   Vivi Barrack, MD  HYDROcodone-acetaminophen (NORCO) 5-325 MG tablet Take 1-2 tablets by mouth every 6 (six) hours as needed for moderate pain. 06/18/15   Lovett Calender, PA-C  levonorgestrel (MIRENA) 20 MCG/24HR IUD 1 each by Intrauterine route once.    Historical  Provider, MD  metformin (FORTAMET) 1000 MG (OSM) 24 hr tablet Take 2,000 mg by mouth daily with breakfast.    Historical Provider, MD  traMADol (ULTRAM) 50 MG tablet Take 1 tablet (50 mg total) by mouth every 6 (six) hours as needed. 06/18/15   Brittney Kelly, PA-C   BP 118/77 mmHg  Pulse 99  Temp(Src) 98.4 F (36.9 C) (Oral)  Resp 18  Ht '5\' 4"'  (1.626 m)  Wt 261 lb (118.389 kg)  BMI 44.78 kg/m2  SpO2 98%  LMP 06/02/2015 Physical Exam  Constitutional: She appears well-developed and well-nourished. No distress.  HENT:  Head: Normocephalic and atraumatic.  Neck: Neck supple.  Pulmonary/Chest: Effort normal.  Musculoskeletal:  Bilateral hands: Full active range of motion of all digits, strength 5/5, sensation intact, capillary refill < 2 seconds.  See skin exam  Neurological: She is alert.  Skin: She is not diaphoretic.    3 cm superficial laceration to the right thumb over posterior radial aspect that is hemostatic  Tiny puncture wound at base of left thumb No body tenderness.   Small scab over anterior proximal shin.  No erythema, edema, warmth, discharge, or tenderness   Nursing note and vitals reviewed.   ED Course  Procedures (including critical care time) DIAGNOSTIC STUDIES: Oxygen Saturation is 98% on RA, normal by my interpretation.    COORDINATION OF CARE: 9:09 PM- Pt advised of plan for treatment and pt agrees. Will receive local anesthesia with a laceration repair. She will be placed on a short course antibiotics (Orthopedics has prescribed a course of Keflex which pt picked up and started today)   Labs Review Labs Reviewed - No data to display  Imaging Review No results found.   Clayton Bibles, PA-C has personally reviewed and evaluated these images and lab results as part of her medical decision-making.   EKG Interpretation None       LACERATION REPAIR Performed by: Clayton Bibles Authorized by: Clayton Bibles Consent: Verbal consent obtained. Risks and  benefits: risks, benefits and alternatives were discussed Consent given by: patient Patient identity confirmed: provided demographic data Prepped and Draped in normal sterile fashion Wound explored  Laceration Location: right thumb  Laceration Length: 3cm  No Foreign Bodies seen or palpated  Anesthesia: digital block  Local anesthetic: lidocaine 1% no epinephrine  Anesthetic total: 5 ml  Irrigation method: syringe Amount of cleaning: standard  Skin closure: 5-0 vicryl  Number of sutures: 3  Technique: simple interrupted  Patient tolerance: Patient tolerated the procedure well with no immediate complications.   MDM   Final diagnoses:  Hand laceration, right, initial encounter    Afebrile, nontoxic patient with superficial laceration to right thumb without tendon involvement.  Tiny puncture vs abrasion of left thumb without need for repair.  Right thumb laceration  repaired in ED.  Pt prescribed keflex by orthopedist a few days ago, pt started today.  This will cover her to prevent infection from these injuries.   D/C home with wound care instructions, return precautions.  Discussed result, findings, treatment, and follow up  with patient.  Pt given return precautions.  Pt verbalizes understanding and agrees with plan.       I personally performed the services described in this documentation, which was scribed in my presence. The recorded information has been reviewed and is accurate.   Clayton Bibles, PA-C 06/22/15 [place] Yao, MD 06/22/15 [phone]

## 2015-06-22 NOTE — ED Notes (Signed)
Pt reports she cut her right thumb with an apple slicer. No active bleeding noted. Pt unsure of last tetanus.

## 2015-06-22 NOTE — Discharge Instructions (Signed)
Read the information below.  You may return to the Emergency Department at any time for worsening condition or any new symptoms that concern you.  If you develop redness, swelling, pus draining from the wound, or fevers greater than 100.4, return to the ER immediately for a recheck.    Please take the antibiotic as prescribed by your orthopedist.     Laceration Care, Adult A laceration is a cut that goes through all layers of the skin. The cut also goes into the tissue that is right under the skin. Some cuts heal on their own. Others need to be closed with stitches (sutures), staples, skin adhesive strips, or wound glue. Taking care of your cut lowers your risk of infection and helps your cut to heal better. HOW TO TAKE CARE OF YOUR CUT For stitches or staples:  Keep the wound clean and dry.  If you were given a bandage (dressing), you should change it at least one time per day or as told by your doctor. You should also change it if it gets wet or dirty.  Keep the wound completely dry for the first 24 hours or as told by your doctor. After that time, you may take a shower or a bath. However, make sure that the wound is not soaked in water until after the stitches or staples have been removed.  Clean the wound one time each day or as told by your doctor:  Wash the wound with soap and water.  Rinse the wound with water until all of the soap comes off.  Pat the wound dry with a clean towel. Do not rub the wound.  After you clean the wound, put a thin layer of antibiotic ointment on it as told by your doctor. This ointment:  Helps to prevent infection.  Keeps the bandage from sticking to the wound.  Have your stitches or staples removed as told by your doctor. If your doctor used skin adhesive strips:   Keep the wound clean and dry.  If you were given a bandage, you should change it at least one time per day or as told by your doctor. You should also change it if it gets dirty or  wet.  Do not get the skin adhesive strips wet. You can take a shower or a bath, but be careful to keep the wound dry.  If the wound gets wet, pat it dry with a clean towel. Do not rub the wound.  Skin adhesive strips fall off on their own. You can trim the strips as the wound heals. Do not remove any strips that are still stuck to the wound. They will fall off after a while. If your doctor used wound glue:  Try to keep your wound dry, but you may briefly wet it in the shower or bath. Do not soak the wound in water, such as by swimming.  After you take a shower or a bath, gently pat the wound dry with a clean towel. Do not rub the wound.  Do not do any activities that will make you really sweaty until the skin glue has fallen off on its own.  Do not apply liquid, cream, or ointment medicine to your wound while the skin glue is still on.  If you were given a bandage, you should change it at least one time per day or as told by your doctor. You should also change it if it gets dirty or wet.  If a bandage is placed over  the wound, do not let the tape for the bandage touch the skin glue.  Do not pick at the glue. The skin glue usually stays on for 5-10 days. Then, it falls off of the skin. General Instructions  To help prevent scarring, make sure to cover your wound with sunscreen whenever you are outside after stitches are removed, after adhesive strips are removed, or when wound glue stays in place and the wound is healed. Make sure to wear a sunscreen of at least 30 SPF.  Take over-the-counter and prescription medicines only as told by your doctor.  If you were given antibiotic medicine or ointment, take or apply it as told by your doctor. Do not stop using the antibiotic even if your wound is getting better.  Do not scratch or pick at the wound.  Keep all follow-up visits as told by your doctor. This is important.  Check your wound every day for signs of infection. Watch  for:  Redness, swelling, or pain.  Fluid, blood, or pus.  Raise (elevate) the injured area above the level of your heart while you are sitting or lying down, if possible. GET HELP IF:  You got a tetanus shot and you have any of these problems at the injection site:  Swelling.  Very bad pain.  Redness.  Bleeding.  You have a fever.  A wound that was closed breaks open.  You notice a bad smell coming from your wound or your bandage.  You notice something coming out of the wound, such as wood or glass.  Medicine does not help your pain.  You have more redness, swelling, or pain at the site of your wound.  You have fluid, blood, or pus coming from your wound.  You notice a change in the color of your skin near your wound.  You need to change the bandage often because fluid, blood, or pus is coming from the wound.  You start to have a new rash.  You start to have numbness around the wound. GET HELP RIGHT AWAY IF:  You have very bad swelling around the wound.  Your pain suddenly gets worse and is very bad.  You notice painful lumps near the wound or on skin that is anywhere on your body.  You have a red streak going away from your wound.  The wound is on your hand or foot and you cannot move a finger or toe like you usually can.  The wound is on your hand or foot and you notice that your fingers or toes look pale or bluish.   This information is not intended to replace advice given to you by your health care provider. Make sure you discuss any questions you have with your health care provider.   Document Released: 01/10/2008 Document Revised: 12/08/2014 Document Reviewed: 07/20/2014 Elsevier Interactive Patient Education [employer]

## 2015-06-28 NOTE — H&P (Signed)
PREOPERATIVE H&P  Chief Complaint: other spontaneous disruption of anterior cruciate ligament of left knee, other tear of medial meniscus, current injury, left knee, other tear of medial meniscus, current injury, left knee  HPI: Krystal [name] is a 29 y.o. female who presents for preoperative history and physical with a diagnosis of other spontaneous disruption of anterior cruciate ligament of left knee, other tear of medial meniscus, current injury, left knee, other tear of medial meniscus, current injury, left knee. Symptoms are rated as moderate to severe, and have been worsening.  This is significantly impairing activities of daily living.  She has elected for surgical management.   Past Medical History  Diagnosis Date  . Obesity   . Non-insulin dependent type 2 diabetes mellitus ([place])   . Left ACL tear 06/2015  . Medial meniscus tear 06/2015    left knee  . Runny nose 06/15/2015    clear drainage  . Heartburn     occasional - TUMS as needed  . Abrasion of left knee 06/15/2015   Past Surgical History  Procedure Laterality Date  . Cesarean section N/A 08/18/2013    Procedure: CESAREAN SECTION;  Surgeon: Donnamae Jude, MD;  Location: [facility];  Service: Obstetrics;  Laterality: N/A;  . Excision bone cyst      gum   Social History   Social History  . Marital Status: Married    Spouse Name: N/A  . Number of Children: N/A  . Years of Education: N/A   Social History Main Topics  . Smoking status: Current Every Day Smoker -- 0.50 packs/day for 10 years    Types: Cigarettes  . Smokeless tobacco: Never Used  . Alcohol Use: Yes     Comment: occasionally  . Drug Use: No  . Sexual Activity: Yes    Birth Control/ Protection: IUD   Other Topics Concern  . Not on file   Social History Narrative   Family History  Problem Relation Age of Onset  . Asthma Mother   . Diabetes Mother   . Alcohol abuse Mother    Allergies  Allergen Reactions  . Vicodin  [Hydrocodone-Acetaminophen] Hives   Prior to Admission medications   Medication Sig Start Date End Date Taking? Authorizing Provider  ACCU-CHEK FASTCLIX LANCETS MISC CHECK BLOOD SUGAR ONCE DAILY EVERY MORNING and if signs of high or low sugar 03/19/14   Hilton Sinclair, MD  Blood Glucose Monitoring Suppl (BLOOD GLUCOSE MONITOR SYSTEM) W/DEVICE KIT Use with Accu-check lancets and strips to test blood sugar daily (fasting and if any symptoms of high or low sugar). 05/07/15   Vivi Barrack, MD  cephALEXin (KEFLEX) 500 MG capsule Take 1 capsule (500 mg total) by mouth 3 (three) times daily. Patient not taking: Reported on 06/22/2015 06/18/15   Lovett Calender, PA-C  glucose blood (ACCU-CHEK AVIVA) test strip Use as instructed 03/19/14   Hilton Sinclair, MD  glyBURIDE (DIABETA) 2.5 MG tablet Take 1 tablet (2.5 mg total) by mouth 2 (two) times daily. 05/07/15   Vivi Barrack, MD  HYDROcodone-acetaminophen (NORCO) 5-325 MG tablet Take 1-2 tablets by mouth every 6 (six) hours as needed for moderate pain. Patient not taking: Reported on 06/22/2015 06/18/15   Lovett Calender, PA-C  levonorgestrel (MIRENA) 20 MCG/24HR IUD 1 each by Intrauterine route once.    Historical Provider, MD  metformin (FORTAMET) 1000 MG (OSM) 24 hr tablet Take 2,000 mg by mouth daily with breakfast.    Historical Provider, MD  traMADol Veatrice Bourbon) 50  MG tablet Take 1 tablet (50 mg total) by mouth every 6 (six) hours as needed. Patient taking differently: Take 50 mg by mouth every 6 (six) hours as needed for moderate pain.  06/18/15   Taiyana Kissler, PA-C     Positive ROS: All other systems have been reviewed and were otherwise negative with the exception of those mentioned in the HPI and as above.  Physical Exam: General: Alert, no acute distress Cardiovascular: No pedal edema Respiratory: No cyanosis, no use of accessory musculature GI: No organomegaly, abdomen is soft and non-tender Skin: No lesions in the area of chief  complaint Neurologic: Sensation intact distally Psychiatric: Patient is competent for consent with normal mood and affect Lymphatic: No axillary or cervical lymphadenopathy  MUSCULOSKELETAL:  L knee has no erythema or warmth.  No rashes or skin breakdown.  No tenderness to palpation.  Full ROM of the knee.  Laxity with lachman. Sensatation intact with 2+ distal pulses.   Assessment: other spontaneous disruption of anterior cruciate ligament of left knee, other tear of medial meniscus, current injury, left knee, other tear of medial meniscus, current injury, left knee  Plan: Plan for Procedure(s): LEFT KNEE ARTHROSCOPY WITH MEDIAL MENISECTOMY VERSES REPAIR, ANTERIOR CRUCIATE LIGAMENT REPAIR KNEE ARTHROSCOPY WITH ANTERIOR CRUCIATE LIGAMENT (ACL) REPAIR  The risks benefits and alternatives were discussed with the patient including but not limited to the risks of nonoperative treatment, versus surgical intervention including infection, bleeding, nerve injury,  blood clots, cardiopulmonary complications, morbidity, mortality, among others, and they were willing to proceed.   Gae Dry, PA-C  06/28/2015 4:51 PM

## 2015-07-08 DIAGNOSIS — S83249A Other tear of medial meniscus, current injury, unspecified knee, initial encounter: Secondary | ICD-10-CM

## 2015-07-08 DIAGNOSIS — S83512A Sprain of anterior cruciate ligament of left knee, initial encounter: Secondary | ICD-10-CM

## 2015-07-08 HISTORY — DX: Other tear of medial meniscus, current injury, unspecified knee, initial encounter: S83.249A

## 2015-07-08 HISTORY — DX: Sprain of anterior cruciate ligament of left knee, initial encounter: S83.512A

## 2015-07-19 ENCOUNTER — Encounter (HOSPITAL_BASED_OUTPATIENT_CLINIC_OR_DEPARTMENT_OTHER): Payer: Self-pay | Admitting: *Deleted

## 2015-07-19 NOTE — Pre-Procedure Instructions (Signed)
To come for BMET 

## 2015-07-22 ENCOUNTER — Encounter (HOSPITAL_BASED_OUTPATIENT_CLINIC_OR_DEPARTMENT_OTHER)
Admission: RE | Admit: 2015-07-22 | Discharge: 2015-07-22 | Disposition: A | Discharge status: Home / Self Care | Payer: Medicaid Other | Source: Ambulatory Visit | Attending: Orthopedic Surgery | Admitting: Orthopedic Surgery

## 2015-07-22 DIAGNOSIS — Z01812 Encounter for preprocedural laboratory examination: Secondary | ICD-10-CM | POA: Insufficient documentation

## 2015-07-22 DIAGNOSIS — K219 Gastro-esophageal reflux disease without esophagitis: Secondary | ICD-10-CM | POA: Diagnosis not present

## 2015-07-22 DIAGNOSIS — M23611 Other spontaneous disruption of anterior cruciate ligament of right knee: Secondary | ICD-10-CM | POA: Insufficient documentation

## 2015-07-22 DIAGNOSIS — F329 Major depressive disorder, single episode, unspecified: Secondary | ICD-10-CM | POA: Diagnosis not present

## 2015-07-22 DIAGNOSIS — E669 Obesity, unspecified: Secondary | ICD-10-CM | POA: Diagnosis not present

## 2015-07-22 DIAGNOSIS — M23612 Other spontaneous disruption of anterior cruciate ligament of left knee: Secondary | ICD-10-CM | POA: Diagnosis not present

## 2015-07-22 DIAGNOSIS — S83241A Other tear of medial meniscus, current injury, right knee, initial encounter: Secondary | ICD-10-CM | POA: Insufficient documentation

## 2015-07-22 DIAGNOSIS — E119 Type 2 diabetes mellitus without complications: Secondary | ICD-10-CM | POA: Diagnosis not present

## 2015-07-22 DIAGNOSIS — M23232 Derangement of other medial meniscus due to old tear or injury, left knee: Secondary | ICD-10-CM | POA: Diagnosis present

## 2015-07-22 DIAGNOSIS — Z885 Allergy status to narcotic agent status: Secondary | ICD-10-CM | POA: Diagnosis not present

## 2015-07-22 DIAGNOSIS — X58XXXA Exposure to other specified factors, initial encounter: Secondary | ICD-10-CM | POA: Insufficient documentation

## 2015-07-22 DIAGNOSIS — F17210 Nicotine dependence, cigarettes, uncomplicated: Secondary | ICD-10-CM | POA: Diagnosis not present

## 2015-07-22 DIAGNOSIS — Z6841 Body Mass Index (BMI) 40.0 and over, adult: Secondary | ICD-10-CM | POA: Diagnosis not present

## 2015-07-22 LAB — BASIC METABOLIC PANEL
Anion gap: 8 (ref 5–15)
BUN: 7 mg/dL (ref 6–20)
CHLORIDE: 100 mmol/L — AB (ref 101–111)
CO2: 27 mmol/L (ref 22–32)
CREATININE: 0.58 mg/dL (ref 0.44–1.00)
Calcium: 9 mg/dL (ref 8.9–10.3)
GFR calc non Af Amer: >60 mL/min (ref >60)
Glucose, Bld: 354 mg/dL — ABNORMAL HIGH (ref 65–99)
Potassium: 4.2 mmol/L (ref 3.5–5.1)
Sodium: 135 mmol/L (ref 135–145)

## 2015-07-22 NOTE — Pre-Procedure Instructions (Signed)
Dr. Glade Stanford. Fitzgerald notified of glucose of 354 (lab result from today); will recheck tomorrow prior to surgery, but if glucose is out of control, surgery may be cx.  Pt. notified; states she had just eaten prior to blood draw and AM sugar today was 120; pt. voiced understanding that if blood sugar is too high, surgery will be cx. tomorrow.

## 2015-07-23 ENCOUNTER — Encounter (HOSPITAL_BASED_OUTPATIENT_CLINIC_OR_DEPARTMENT_OTHER): Payer: Self-pay

## 2015-07-23 ENCOUNTER — Encounter (HOSPITAL_BASED_OUTPATIENT_CLINIC_OR_DEPARTMENT_OTHER)
Admission: RE | Disposition: A | Discharge status: Home / Self Care | Payer: Self-pay | Source: Ambulatory Visit | Attending: Orthopedic Surgery

## 2015-07-23 ENCOUNTER — Ambulatory Visit (HOSPITAL_BASED_OUTPATIENT_CLINIC_OR_DEPARTMENT_OTHER)
Admission: RE | Admit: 2015-07-23 | Discharge: 2015-07-23 | Disposition: A | Discharge status: Home / Self Care | Payer: Medicaid Other | Source: Ambulatory Visit | Attending: Orthopedic Surgery | Admitting: Orthopedic Surgery

## 2015-07-23 ENCOUNTER — Ambulatory Visit (HOSPITAL_BASED_OUTPATIENT_CLINIC_OR_DEPARTMENT_OTHER): Payer: Medicaid Other | Admitting: Anesthesiology

## 2015-07-23 DIAGNOSIS — E669 Obesity, unspecified: Secondary | ICD-10-CM | POA: Insufficient documentation

## 2015-07-23 DIAGNOSIS — E119 Type 2 diabetes mellitus without complications: Secondary | ICD-10-CM | POA: Diagnosis not present

## 2015-07-23 DIAGNOSIS — M23232 Derangement of other medial meniscus due to old tear or injury, left knee: Secondary | ICD-10-CM | POA: Diagnosis not present

## 2015-07-23 DIAGNOSIS — M23612 Other spontaneous disruption of anterior cruciate ligament of left knee: Secondary | ICD-10-CM | POA: Diagnosis not present

## 2015-07-23 DIAGNOSIS — F17210 Nicotine dependence, cigarettes, uncomplicated: Secondary | ICD-10-CM | POA: Insufficient documentation

## 2015-07-23 DIAGNOSIS — Z885 Allergy status to narcotic agent status: Secondary | ICD-10-CM | POA: Insufficient documentation

## 2015-07-23 DIAGNOSIS — Z6841 Body Mass Index (BMI) 40.0 and over, adult: Secondary | ICD-10-CM | POA: Insufficient documentation

## 2015-07-23 DIAGNOSIS — K219 Gastro-esophageal reflux disease without esophagitis: Secondary | ICD-10-CM | POA: Insufficient documentation

## 2015-07-23 DIAGNOSIS — S83512A Sprain of anterior cruciate ligament of left knee, initial encounter: Secondary | ICD-10-CM

## 2015-07-23 DIAGNOSIS — F329 Major depressive disorder, single episode, unspecified: Secondary | ICD-10-CM | POA: Insufficient documentation

## 2015-07-23 HISTORY — PX: KNEE ARTHROSCOPY WITH MEDIAL MENISECTOMY: SHX5651

## 2015-07-23 HISTORY — PX: KNEE ARTHROSCOPY WITH MENISCAL REPAIR: SHX5653

## 2015-07-23 HISTORY — PX: ANTERIOR CRUCIATE LIGAMENT REPAIR: SHX115

## 2015-07-23 LAB — GLUCOSE, CAPILLARY
Glucose-Capillary: 214 mg/dL — ABNORMAL HIGH (ref 65–99)
Glucose-Capillary: 230 mg/dL — ABNORMAL HIGH (ref 65–99)

## 2015-07-23 SURGERY — ARTHROSCOPY, KNEE, WITH MEDIAL MENISCECTOMY
Anesthesia: Regional | Site: Knee | Laterality: Left

## 2015-07-23 MED ORDER — OXYCODONE-ACETAMINOPHEN 5-325 MG PO TABS
ORAL_TABLET | ORAL | Status: AC
  Filled 2015-07-23: qty 1

## 2015-07-23 MED ORDER — DEXAMETHASONE SODIUM PHOSPHATE 10 MG/ML IJ SOLN
INTRAMUSCULAR | Status: AC
  Filled 2015-07-23: qty 1

## 2015-07-23 MED ORDER — MIDAZOLAM HCL 2 MG/2ML IJ SOLN
1.0000 mg | INTRAMUSCULAR | Status: DC | PRN
  Administered 2015-07-23 (×2): 2 mg via INTRAVENOUS

## 2015-07-23 MED ORDER — LIDOCAINE HCL (CARDIAC) 20 MG/ML IV SOLN
INTRAVENOUS | Status: DC | PRN
  Administered 2015-07-23: 80 mg via INTRAVENOUS

## 2015-07-23 MED ORDER — LACTATED RINGERS IV SOLN: 500.0000 mL | INTRAVENOUS | Status: DC

## 2015-07-23 MED ORDER — METHYLPREDNISOLONE ACETATE 80 MG/ML IJ SUSP
INTRAMUSCULAR | Status: DC | PRN
  Administered 2015-07-23: 80 mg via INTRA_ARTICULAR

## 2015-07-23 MED ORDER — FENTANYL CITRATE (PF) 100 MCG/2ML IJ SOLN
100.0000 ug | Freq: Once | INTRAMUSCULAR | Status: AC
  Administered 2015-07-23: 100 ug via INTRAVENOUS

## 2015-07-23 MED ORDER — LIDOCAINE HCL (CARDIAC) 20 MG/ML IV SOLN
INTRAVENOUS | Status: AC
  Filled 2015-07-23: qty 5

## 2015-07-23 MED ORDER — MIDAZOLAM HCL 2 MG/2ML IJ SOLN
INTRAMUSCULAR | Status: AC
  Filled 2015-07-23: qty 2

## 2015-07-23 MED ORDER — HYDROMORPHONE HCL 1 MG/ML IJ SOLN
0.2500 mg | INTRAMUSCULAR | Status: DC | PRN
  Administered 2015-07-23 (×4): 0.5 mg via INTRAVENOUS

## 2015-07-23 MED ORDER — FENTANYL CITRATE (PF) 100 MCG/2ML IJ SOLN
50.0000 ug | INTRAMUSCULAR | Status: AC | PRN
  Administered 2015-07-23 (×2): 25 ug via INTRAVENOUS
  Administered 2015-07-23 (×2): 50 ug via INTRAVENOUS
  Administered 2015-07-23 (×2): 25 ug via INTRAVENOUS
  Administered 2015-07-23: 100 ug via INTRAVENOUS

## 2015-07-23 MED ORDER — BUPIVACAINE-EPINEPHRINE (PF) 0.5% -1:200000 IJ SOLN
INTRAMUSCULAR | Status: DC | PRN
  Administered 2015-07-23: 30 mL via PERINEURAL

## 2015-07-23 MED ORDER — ONDANSETRON HCL 4 MG/2ML IJ SOLN
INTRAMUSCULAR | Status: AC
  Filled 2015-07-23: qty 2

## 2015-07-23 MED ORDER — CEFAZOLIN SODIUM-DEXTROSE 2-3 GM-% IV SOLR
INTRAVENOUS | Status: AC
  Filled 2015-07-23: qty 50

## 2015-07-23 MED ORDER — OXYCODONE-ACETAMINOPHEN 5-325 MG PO TABS
1.0000 | ORAL_TABLET | Freq: Once | ORAL | Status: AC
  Administered 2015-07-23: 1 via ORAL

## 2015-07-23 MED ORDER — DEXAMETHASONE SODIUM PHOSPHATE 10 MG/ML IJ SOLN
INTRAMUSCULAR | Status: DC | PRN
  Administered 2015-07-23: 5 mg via INTRAVENOUS

## 2015-07-23 MED ORDER — HYDROMORPHONE HCL 1 MG/ML IJ SOLN
INTRAMUSCULAR | Status: AC
  Filled 2015-07-23: qty 1

## 2015-07-23 MED ORDER — METHYLPREDNISOLONE ACETATE 80 MG/ML IJ SUSP
INTRAMUSCULAR | Status: AC
  Filled 2015-07-23: qty 1

## 2015-07-23 MED ORDER — MIDAZOLAM HCL 5 MG/ML IJ SOLN: 2.0000 mg | Freq: Once | INTRAMUSCULAR | Status: DC

## 2015-07-23 MED ORDER — SODIUM CHLORIDE 0.9 % IR SOLN
Status: DC | PRN
  Administered 2015-07-23: 8000 mL

## 2015-07-23 MED ORDER — KETOROLAC TROMETHAMINE 30 MG/ML IJ SOLN
30.0000 mg | Freq: Once | INTRAMUSCULAR | Status: AC
  Administered 2015-07-23: 30 mg via INTRAVENOUS

## 2015-07-23 MED ORDER — LACTATED RINGERS IV SOLN
INTRAVENOUS | Status: DC
  Administered 2015-07-23 (×2): via INTRAVENOUS

## 2015-07-23 MED ORDER — ONDANSETRON HCL 4 MG/2ML IJ SOLN
INTRAMUSCULAR | Status: DC | PRN
  Administered 2015-07-23: 4 mg via INTRAVENOUS

## 2015-07-23 MED ORDER — OXYCODONE-ACETAMINOPHEN 5-325 MG PO TABS: 1.0000 | ORAL_TABLET | ORAL | Status: DC | PRN

## 2015-07-23 MED ORDER — ACETAMINOPHEN 500 MG PO TABS
1000.0000 mg | ORAL_TABLET | Freq: Once | ORAL | Status: AC
  Administered 2015-07-23: 1000 mg via ORAL

## 2015-07-23 MED ORDER — FENTANYL CITRATE (PF) 100 MCG/2ML IJ SOLN
INTRAMUSCULAR | Status: AC
  Filled 2015-07-23: qty 2

## 2015-07-23 MED ORDER — BUPIVACAINE HCL (PF) 0.25 % IJ SOLN
INTRAMUSCULAR | Status: DC | PRN
  Administered 2015-07-23: 6 mL via INTRA_ARTICULAR

## 2015-07-23 MED ORDER — PHENYLEPHRINE HCL 10 MG/ML IJ SOLN
INTRAMUSCULAR | Status: DC | PRN
  Administered 2015-07-23 (×2): 80 ug via INTRAVENOUS

## 2015-07-23 MED ORDER — CEFAZOLIN SODIUM-DEXTROSE 2-3 GM-% IV SOLR
2.0000 g | INTRAVENOUS | Status: AC
  Administered 2015-07-23: 2 g via INTRAVENOUS

## 2015-07-23 MED ORDER — POTASSIUM CHLORIDE IN NACL 20-0.45 MEQ/L-% IV SOLN: INTRAVENOUS | Status: DC

## 2015-07-23 MED ORDER — ACETAMINOPHEN 500 MG PO TABS
ORAL_TABLET | ORAL | Status: AC
  Filled 2015-07-23: qty 2

## 2015-07-23 MED ORDER — GLYCOPYRROLATE 0.2 MG/ML IJ SOLN: 0.2000 mg | Freq: Once | INTRAMUSCULAR | Status: DC | PRN

## 2015-07-23 MED ORDER — CHLORHEXIDINE GLUCONATE 4 % EX LIQD: 60.0000 mL | Freq: Once | CUTANEOUS | Status: DC

## 2015-07-23 MED ORDER — DOCUSATE SODIUM 100 MG PO CAPS: 100.0000 mg | ORAL_CAPSULE | Freq: Two times a day (BID) | ORAL | Status: DC

## 2015-07-23 MED ORDER — ONDANSETRON HCL 4 MG PO TABS: 4.0000 mg | ORAL_TABLET | Freq: Three times a day (TID) | ORAL | Status: DC | PRN

## 2015-07-23 MED ORDER — SCOPOLAMINE 1 MG/3DAYS TD PT72: 1.0000 | MEDICATED_PATCH | Freq: Once | TRANSDERMAL | Status: DC

## 2015-07-23 MED ORDER — BUPIVACAINE HCL (PF) 0.25 % IJ SOLN
INTRAMUSCULAR | Status: AC
  Filled 2015-07-23: qty 30

## 2015-07-23 MED ORDER — KETOROLAC TROMETHAMINE 30 MG/ML IJ SOLN
INTRAMUSCULAR | Status: AC
  Filled 2015-07-23: qty 1

## 2015-07-23 MED ORDER — PROPOFOL 10 MG/ML IV BOLUS
INTRAVENOUS | Status: AC
  Filled 2015-07-23: qty 20

## 2015-07-23 MED ORDER — PROPOFOL 10 MG/ML IV BOLUS
INTRAVENOUS | Status: DC | PRN
  Administered 2015-07-23: 300 mg via INTRAVENOUS

## 2015-07-23 MED ORDER — METHOCARBAMOL 500 MG PO TABS: 500.0000 mg | ORAL_TABLET | Freq: Four times a day (QID) | ORAL | Status: DC

## 2015-07-23 SURGICAL SUPPLY — 114 items
ALLOGRAFT GRFTLNK IMPLANT SYST (Anchor) IMPLANT
ANCHOR BUTTON TIGHTROPE RN 14 (Anchor) ×2 IMPLANT
BANDAGE ELASTIC 6 VELCRO ST LF (GAUZE/BANDAGES/DRESSINGS) ×3 IMPLANT
BANDAGE ESMARK 6X9 LF (GAUZE/BANDAGES/DRESSINGS) ×1 IMPLANT
BLADE 4.2CUDA (BLADE) IMPLANT
BLADE CUDA 5.5 (BLADE) IMPLANT
BLADE CUDA GRT WHITE 3.5 (BLADE) ×2 IMPLANT
BLADE CUTTER GATOR 3.5 (BLADE) ×3 IMPLANT
BLADE CUTTER MENIS 5.5 (BLADE) IMPLANT
BLADE GREAT WHITE 4.2 (BLADE) IMPLANT
BLADE GREAT WHITE 4.2MM (BLADE)
BLADE SURG 15 STRL LF DISP TIS (BLADE) ×1 IMPLANT
BLADE SURG 15 STRL SS (BLADE) ×3
BNDG CMPR 9X6 STRL LF SNTH (GAUZE/BANDAGES/DRESSINGS) ×1
BNDG ESMARK 6X9 LF (GAUZE/BANDAGES/DRESSINGS) ×3
BUR OVAL 4.0 (BURR) IMPLANT
BUR OVAL 6.0 (BURR) ×3 IMPLANT
CHLORAPREP W/TINT 26ML (MISCELLANEOUS) ×3 IMPLANT
CLOSURE STERI-STRIP 1/2X4 (GAUZE/BANDAGES/DRESSINGS) ×1
CLSR STERI-STRIP ANTIMIC 1/2X4 (GAUZE/BANDAGES/DRESSINGS) ×2 IMPLANT
COVER BACK TABLE 60X90IN (DRAPES) ×3 IMPLANT
CUFF TOURNIQUET SINGLE 34IN LL (TOURNIQUET CUFF) IMPLANT
CUFF TOURNIQUET SINGLE 44IN (TOURNIQUET CUFF) ×2 IMPLANT
CUTTER FLIP II 9.5MM (INSTRUMENTS) IMPLANT
CUTTER KNOT PUSHER 2-0 FIBERWI (INSTRUMENTS) IMPLANT
CUTTER MENISCUS  4.2MM (BLADE)
CUTTER MENISCUS 4.2MM (BLADE) IMPLANT
DECANTER SPIKE VIAL GLASS SM (MISCELLANEOUS) IMPLANT
DRAPE ARTHROSCOPY W/POUCH 90 (DRAPES) ×3 IMPLANT
DRAPE OEC MINIVIEW 54X84 (DRAPES) ×3 IMPLANT
DRAPE U 20/CS (DRAPES) ×3 IMPLANT
DRAPE U-SHAPE 47X51 STRL (DRAPES) ×3 IMPLANT
DRILL FLIPCUTTER II 10.5MM (CUTTER) IMPLANT
DRILL FLIPCUTTER II 10MM (CUTTER) IMPLANT
DRILL FLIPCUTTER II 7.0MM (INSTRUMENTS) IMPLANT
DRILL FLIPCUTTER II 7.5MM (MISCELLANEOUS) IMPLANT
DRILL FLIPCUTTER II 8.0MM (INSTRUMENTS) IMPLANT
DRILL FLIPCUTTER II 8.5MM (INSTRUMENTS) IMPLANT
DRILL FLIPCUTTER II 9.0MM (INSTRUMENTS) IMPLANT
DRSG EMULSION OIL 3X3 NADH (GAUZE/BANDAGES/DRESSINGS) ×3 IMPLANT
DRSG PAD ABDOMINAL 8X10 ST (GAUZE/BANDAGES/DRESSINGS) ×3 IMPLANT
ELECT REM PT RETURN 9FT ADLT (ELECTROSURGICAL) ×3
ELECTRODE REM PT RTRN 9FT ADLT (ELECTROSURGICAL) ×1 IMPLANT
FIBERSTICK 2 (SUTURE) IMPLANT
FLIP CUTTER II 7.0MM (INSTRUMENTS)
FLIPCUTTER II 10.5MM (CUTTER)
FLIPCUTTER II 10MM (CUTTER)
FLIPCUTTER II 7.5MM (MISCELLANEOUS)
FLIPCUTTER II 8.0MM (INSTRUMENTS)
FLIPCUTTER II 8.5MM (INSTRUMENTS)
FLIPCUTTER II 9.0MM (INSTRUMENTS) ×3
GAUZE SPONGE 4X4 12PLY STRL (GAUZE/BANDAGES/DRESSINGS) ×6 IMPLANT
GLOVE BIO SURGEON STRL SZ 6.5 (GLOVE) ×1 IMPLANT
GLOVE BIO SURGEON STRL SZ7 (GLOVE) ×3 IMPLANT
GLOVE BIO SURGEON STRL SZ7.5 (GLOVE) ×3 IMPLANT
GLOVE BIO SURGEON STRL SZ8 (GLOVE) ×3 IMPLANT
GLOVE BIO SURGEONS STRL SZ 6.5 (GLOVE) ×1
GLOVE BIOGEL PI IND STRL 7.0 (GLOVE) ×1 IMPLANT
GLOVE BIOGEL PI IND STRL 8 (GLOVE) ×1 IMPLANT
GLOVE BIOGEL PI IND STRL 8.5 (GLOVE) IMPLANT
GLOVE BIOGEL PI INDICATOR 7.0 (GLOVE) ×4
GLOVE BIOGEL PI INDICATOR 8 (GLOVE) ×2
GLOVE BIOGEL PI INDICATOR 8.5 (GLOVE)
GOWN STRL REUS W/ TWL LRG LVL3 (GOWN DISPOSABLE) ×3 IMPLANT
GOWN STRL REUS W/ TWL XL LVL3 (GOWN DISPOSABLE) ×1 IMPLANT
GOWN STRL REUS W/TWL LRG LVL3 (GOWN DISPOSABLE) ×6
GOWN STRL REUS W/TWL XL LVL3 (GOWN DISPOSABLE) ×3
GRAFT TISS 60-80 FRZN TENDON (Tissue) IMPLANT
GUIDEPIN REAMER CUTTER 11MM (INSTRUMENTS) IMPLANT
IMMOBILIZER KNEE 22 UNIV (SOFTGOODS) ×2 IMPLANT
IMMOBILIZER KNEE 24 THIGH 36 (MISCELLANEOUS) IMPLANT
IMMOBILIZER KNEE 24 UNIV (MISCELLANEOUS)
IMPL MENISCAL REP CVD NDL (Anchor) IMPLANT
IMPLANT MENISCAL REP CVD NDL (Anchor) ×3 IMPLANT
KIT TRANSTIBIAL (DISPOSABLE) IMPLANT
KNEE WRAP E Z 3 GEL PACK (MISCELLANEOUS) ×3 IMPLANT
LOOP 2 FIBERLINK CLOSED (SUTURE) IMPLANT
MANIFOLD NEPTUNE II (INSTRUMENTS) ×3 IMPLANT
NS IRRIG 1000ML POUR BTL (IV SOLUTION) ×5 IMPLANT
PACK ARTHROSCOPY DSU (CUSTOM PROCEDURE TRAY) ×3 IMPLANT
PACK BASIN DAY SURGERY FS (CUSTOM PROCEDURE TRAY) ×3 IMPLANT
PAD CAST 4YDX4 CTTN HI CHSV (CAST SUPPLIES) ×1 IMPLANT
PADDING CAST COTTON 4X4 STRL (CAST SUPPLIES) ×3
PADDING CAST COTTON 6X4 STRL (CAST SUPPLIES) ×3 IMPLANT
PENCIL BUTTON HOLSTER BLD 10FT (ELECTRODE) IMPLANT
PIN DRILL ACL TIGHTROPE 4MM (PIN) IMPLANT
PK GRAFTLINK ALLO IMPLANT SYST (Anchor) ×3 IMPLANT
SET ARTHROSCOPY TUBING (MISCELLANEOUS) ×3
SET ARTHROSCOPY TUBING LN (MISCELLANEOUS) ×1 IMPLANT
SLEEVE SCD COMPRESS KNEE MED (MISCELLANEOUS) ×2 IMPLANT
SPONGE LAP 4X18 X RAY DECT (DISPOSABLE) IMPLANT
SUCTION FRAZIER TIP 10 FR DISP (SUCTIONS) IMPLANT
SUT 2 FIBERLOOP 20 STRT BLUE (SUTURE)
SUT ETHILON 3 0 PS 1 (SUTURE) ×3 IMPLANT
SUT FIBERWIRE #2 38 T-5 BLUE (SUTURE)
SUT FIBERWIRE 2-0 18 17.9 3/8 (SUTURE)
SUT MNCRL AB 4-0 PS2 18 (SUTURE) IMPLANT
SUT MON AB 2-0 CT1 36 (SUTURE) ×1 IMPLANT
SUT VIC AB 2-0 SH 27 (SUTURE)
SUT VIC AB 2-0 SH 27XBRD (SUTURE) IMPLANT
SUT VIC AB 3-0 SH 27 (SUTURE)
SUT VIC AB 3-0 SH 27X BRD (SUTURE) IMPLANT
SUT VICRYL 4-0 PS2 18IN ABS (SUTURE) IMPLANT
SUTURE 2 FIBERLOOP 20 STRT BLU (SUTURE) IMPLANT
SUTURE FIBERWR #2 38 T-5 BLUE (SUTURE) IMPLANT
SUTURE FIBERWR 2-0 18 17.9 3/8 (SUTURE) IMPLANT
SUTURE TIGERSTICK 2 TIGERWIR 2 (MISCELLANEOUS) IMPLANT
TAPE CLOTH 3X10 TAN LF (GAUZE/BANDAGES/DRESSINGS) ×3 IMPLANT
TIGERSTICK 2 TIGERWIRE 2 (MISCELLANEOUS)
TISSUE GRAFTLINK FGL (Tissue) ×3 IMPLANT
TOWEL OR 17X24 6PK STRL BLUE (TOWEL DISPOSABLE) ×6 IMPLANT
TOWEL OR NON WOVEN STRL DISP B (DISPOSABLE) ×3 IMPLANT
WAND STAR VAC 90 (SURGICAL WAND) ×3 IMPLANT
WATER STERILE IRR 1000ML POUR (IV SOLUTION) ×3 IMPLANT

## 2015-07-23 NOTE — Transfer of Care (Signed)
Immediate Anesthesia Transfer of Care Note  Patient: Lesle Chrisoelle M Malek  Procedure(s) Performed: Procedure(s): LEFT KNEE ARTHROSCOPY WITH PARTIAL MEDIAL MENISCECTOMY  (Left) RECONSTRUCTION ANTERIOR CRUCIATE LIGAMENT (ACL) WITH GRAFTLINK HAMSTRING GRAFT (Left)  Patient Location: PACU  Anesthesia Type:GA combined with regional for post-op pain  Level of Consciousness: sedated  Airway & Oxygen Therapy: Patient Spontanous Breathing and Patient connected to face mask oxygen  Post-op Assessment: Report given to RN and Post -op Vital signs reviewed and stable  Post vital signs: Reviewed and stable  Last Vitals:  Filed Vitals:   07/23/15 1215 07/23/15 1230  BP: 88/50   Pulse: 93 73  Temp:    Resp: 18 18    Complications: No apparent anesthesia complications

## 2015-07-23 NOTE — Progress Notes (Signed)
Assisted Dr. Edmond Fitzgerald with left, ultrasound guided, femoral block. Side rails up, monitors on throughout procedure. See vital signs in flow sheet. Tolerated Procedure well. 

## 2015-07-23 NOTE — Anesthesia Preprocedure Evaluation (Addendum)
Anesthesia Evaluation  Patient identified by MRN, date of birth, ID band Patient awake    Reviewed: Allergy & Precautions, H&P , NPO status , Patient's Chart, lab work & pertinent test results  Airway Mallampati: I  TM Distance: >3 FB Neck ROM: Full    Dental no notable dental hx. (+) Teeth Intact, Dental Advisory Given   Pulmonary Current Smoker,    Pulmonary exam normal breath sounds clear to auscultation       Cardiovascular negative cardio ROS   Rhythm:Regular Rate:Normal     Neuro/Psych  Headaches, Depression    GI/Hepatic Neg liver ROS, GERD  Medicated and Controlled,  Endo/Other  diabetes, Type 2, Oral Hypoglycemic AgentsMorbid obesity  Renal/GU negative Renal ROS  negative genitourinary   Musculoskeletal   Abdominal   Peds  Hematology negative hematology ROS (+)   Anesthesia Other Findings   Reproductive/Obstetrics negative OB ROS                           Anesthesia Physical Anesthesia Plan  ASA: III  Anesthesia Plan: General and Regional   Post-op Pain Management: GA combined w/ Regional for post-op pain   Induction: Intravenous  Airway Management Planned: LMA  Additional Equipment:   Intra-op Plan:   Post-operative Plan: Extubation in OR  Informed Consent: I have reviewed the patients History and Physical, chart, labs and discussed the procedure including the risks, benefits and alternatives for the proposed anesthesia with the patient or authorized representative who has indicated his/her understanding and acceptance.   Dental advisory given  Plan Discussed with: CRNA  Anesthesia Plan Comments:         Anesthesia Quick Evaluation

## 2015-07-23 NOTE — Anesthesia Procedure Notes (Addendum)
Anesthesia Regional Block:  Femoral nerve block  Pre-Anesthetic Checklist: ,, timeout performed, Correct Patient, Correct Site, Correct Laterality, Correct Procedure, Correct Position, site marked, Risks and benefits discussed, pre-op evaluation,  At surgeon's request and post-op pain management  Laterality: Left  Prep: Maximum Sterile Barrier Precautions used and chloraprep       Needles:  Injection technique: Single-shot  Needle Type: Echogenic Stimulator Needle     Needle Length: 5cm 5 cm Needle Gauge: 22 and 22 G    Additional Needles:  Procedures: ultrasound guided (picture in chart) Femoral nerve block  Nerve Stimulator or Paresthesia:  Response: Patellar respose,   Additional Responses:   Narrative:  Start time: 07/23/2015 11:55 AM End time: 07/23/2015 12:05 PM Injection made incrementally with aspirations every 5 mL. Anesthesiologist: Gaynelle AduFITZGERALD, WILLIAM  Additional Notes: 2% Lidocaine skin wheel.    Procedure Name: LMA Insertion Date/Time: 07/23/2015 12:50 PM Performed by: Burna CashONRAD, Skyley Grandmaison C Pre-anesthesia Checklist: Patient identified, Emergency Drugs available, Suction available and Patient being monitored Patient Re-evaluated:Patient Re-evaluated prior to inductionOxygen Delivery Method: Circle System Utilized Preoxygenation: Pre-oxygenation with 100% oxygen Intubation Type: IV induction Ventilation: Mask ventilation without difficulty LMA: LMA inserted LMA Size: 4.0 Number of attempts: 1 Airway Equipment and Method: Bite block Placement Confirmation: positive ETCO2 Tube secured with: Tape Dental Injury: Teeth and Oropharynx as per pre-operative assessment

## 2015-07-23 NOTE — Op Note (Signed)
07/23/2015  5:07 PM  PATIENT:  Krystal [name]    PRE-OPERATIVE DIAGNOSIS:  other spontaneous disruption of anterior cruciate ligament of left knee, other tear of medial meniscus, current injury, left knee, other tear of medial meniscus, current injury, left knee  POST-OPERATIVE DIAGNOSIS:  Same  PROCEDURE:  LEFT KNEE ARTHROSCOPY WITH PARTIAL MEDIAL MENISCECTOMY , RECONSTRUCTION ANTERIOR CRUCIATE LIGAMENT (ACL) WITH GRAFTLINK HAMSTRING GRAFT  SURGEON:  Landy Dunnavant, Jewel Baize, MD  ASSISTANT: Janalee Dane, PA-C, She was present and scrubbed throughout the case, critical for completion in a timely fashion, and for retraction, instrumentation, and closure.      ANESTHESIA:   General  PREOPERATIVE INDICATIONS:  Krystal [name] is a  29 y.o. female with a diagnosis of other spontaneous disruption of anterior cruciate ligament of left knee, other tear of medial meniscus, current injury, left knee, other tear of medial meniscus, current injury, left knee who failed conservative measures and elected for surgical management.    The risks benefits and alternatives were discussed with the patient preoperatively including but not limited to the risks of infection, bleeding, nerve injury, stiffness, cardiopulmonary complications, the need for revision surgery, recurrent instability, progression of arthritis, the potential for use of a allograft and related disease transmission risks, among others and the patient was willing to proceed.  .  OPERATIVE IMPLANTS: Arthrex anterior cruciate ligament Graft link dual tight rope, linvatec meniscal suture  OPERATIVE FINDINGS: The anterior cruciate ligament was completely torn. The PCL was intact. The posterior lateral corner was intact to dial testing. She had a displaced medial parrot beak meniscal tear, and a lateral longitudenal menisco-capsular tear at the posterior horn   OPERATIVE PROCEDURE: The patient was brought to the operating room and placed in  the supine position. General anesthesia was administered. IV antibiotics were given. The lower extremity was prepped and draped in usual sterile fashion. Exam under anesthesia demonstrated the above-named findings. Time out was performed.   The graft leg allograft was thawed opened and prepared for transfer to the leg.  Knee arthroscopy was then performed, and the above named findings were noted.    The anterior cruciate ligament however was torn.  On the medial side she had a grade 2 femoral condyle lesion a 4 x 2 mm.  She had a displaced parrot beak meniscal tear that was flipped anteriorly. I debrided this back to a stable rim.  On the lateral side she had no chondral changes she had a meniscocapsular tear at the posterior horn medial to the popliteal hiatus.  I placed a simple stitch across this tear using an all inside technique.  I was happy with the placement of this.  I then removed the previous anterior cruciate ligament stump, and performed a mild notchplasty.  The outside in guide was then applied to the appropriate position and the retro-cutter was used to drill the femoral socket. Care was taken to maintain the cortical bridge.  I then drilled the tibial tunnel using the retro-cutter, maintaining the outer cortex. All the soft tissue remnants were removed and cleaned at the aperture of the tunnel.  The passing suture was delivered through the medial portal and the through the femoral tunnel. The Endobutton was directly visualized it as it entered the femoral tunnel and flipped.   I then tensioned the anterior cruciate ligament tightrope, and deliver the graft up into the femoral tunnel. I then passed the passing stitch through the medial portal and out the tibial tunnel. I then placed the  Endobutton disc within the suture and walking down to the tibia I confirm that it sat flush on the bone. I then used this to tension the graft into the knee and down into the tunnel. I directly  visualize the tension of the graft. I then cycled the knee 15 times and tension the graft again. I then cycled again placed a posterior drawer at 30 and tension one last time.  Excellent fixation was achieved on both the femoral and tibial side, and the wounds were irrigated copiously and the sartorius fascia repaired with Vicryl, and the portals repaired with Monocryl with Steri-Strips and sterile gauze.  The patient was awakened and returned to PACU in stable and satisfactory condition. There were no complications and She tolerated the procedure well.  Post Operative plan: The patient will be weightbearing as tolerated in a knee immobilizer full time. If under 18 DVT prophylaxis will consist of early ambulation. If over 18 he will consist of early ambulation and aspirin 81 mg once a day.  This note was generated using a template and dragon dictation system. In light of that, I have reviewed the note and all aspects of it are applicable to this case. Any dictation errors are due to the computerized dictation system.

## 2015-07-23 NOTE — Discharge Instructions (Signed)
Keep dressings clean and dry for 3 days then ok to remove and shower.  Weight bearing as tolerated in the leg in the knee immobilizer at all times  CPM machine to start at 60 degrees and increase by 10 degrees daily.  Discharge Instructions After Orthopedic Procedures:  *You may feel tired and weak following your procedure. It is recommended that you limit physical activity for the next 24 hours and rest at home for the remainder of today and tomorrow. *No strenuous activity should be started without your doctor's permission.  Elevate the extremity that you had surgery on to a level above your heart. This should continue for 48 hours or as instructed by your doctor.  If you had hand, arm or shoulder surgery you should move your fingers frequently unless otherwise instructed by your doctor.  If you had foot, knee or leg surgery you should wiggle your toes frequently unless otherwise instructed by your doctor.  Follow your doctor's exact instructions for activity at home. Use your home equipment as instructed. (Crutches, hard shoes, slings etc.)  Limit your activity as instructed by your doctor.  Report to your doctor should any of the following occur: 1. Extreme swelling of your fingers or toes. 2. Inability to wiggle your fingers or toes. 3. Coldness, pale or bluish color in your fingers or toes. 4. Loss of sensation, numbness or tingling of your fingers or toes. 5. Unusual smell or odor from under your dressing or cast. 6. Excessive bleeding or drainage from the surgical site. 7. Pain not relieved by medication your doctor has prescribed for you. 8. Cast or dressing too tight (do not get your dressing or cast wet or put anything under          your dressing or cast.)  *Do not change your dressing unless instructed by your doctor or discharge nurse. Then follow exact instructions.  *Follow labeled instructions for any medications that your doctor may have prescribed for you. *Should  any questions or complications develop following your procedure, PLEASE CONTACT YOUR DOCTOR.  Regional Anesthesia Blocks  1. Numbness or the inability to move the "blocked" extremity may last from 3-48 hours after placement. The length of time depends on the medication injected and your individual response to the medication. If the numbness is not going away after 48 hours, call your surgeon.  2. The extremity that is blocked will need to be protected until the numbness is gone and the  Strength has returned. Because you cannot feel it, you will need to take extra care to avoid injury. Because it may be weak, you may have difficulty moving it or using it. You may not know what position it is in without looking at it while the block is in effect.  3. For blocks in the legs and feet, returning to weight bearing and walking needs to be done carefully. You will need to wait until the numbness is entirely gone and the strength has returned. You should be able to move your leg and foot normally before you try and bear weight or walk. You will need someone to be with you when you first try to ensure you do not fall and possibly risk injury.  4. Bruising and tenderness at the needle site are common side effects and will resolve in a few days.  5. Persistent numbness or new problems with movement should be communicated to the surgeon or the [facility] [phone])/ [facility]  [phone]).  Post  Anesthesia Home Care Instructions  Activity: Get plenty of rest for the remainder of the day. A responsible adult should stay with you for 24 hours following the procedure.  For the next 24 hours, DO NOT: -Drive a car -Advertising copywriter -Drink alcoholic beverages -Take any medication unless instructed by your physician -Make any legal decisions or sign important papers.  Meals: Start with liquid foods such as gelatin or soup. Progress to regular foods as tolerated. Avoid  greasy, spicy, heavy foods. If nausea and/or vomiting occur, drink only clear liquids until the nausea and/or vomiting subsides. Call your physician if vomiting continues.  Special Instructions/Symptoms: Your throat may feel dry or sore from the anesthesia or the breathing tube placed in your throat during surgery. If this causes discomfort, gargle with warm salt water. The discomfort should disappear within 24 hours.  If you had a scopolamine patch placed behind your ear for the management of post- operative nausea and/or vomiting:  1. The medication in the patch is effective for 72 hours, after which it should be removed.  Wrap patch in a tissue and discard in the trash. Wash hands thoroughly with soap and water. 2. You may remove the patch earlier than 72 hours if you experience unpleasant side effects which may include dry mouth, dizziness or visual disturbances. 3. Avoid touching the patch. Wash your hands with soap and water after contact with the patch.

## 2015-07-23 NOTE — Interval H&P Note (Signed)
History and Physical Interval Note:  07/23/2015 7:00 AM  Krystal [name]  has presented today for surgery, with the diagnosis of other spontaneous disruption of anterior cruciate ligament of left knee, other tear of medial meniscus, current injury, left knee, other tear of medial meniscus, current injury, left knee  The various methods of treatment have been discussed with the patient and family. After consideration of risks, benefits and other options for treatment, the patient has consented to  Procedure(s): LEFT KNEE ARTHROSCOPY WITH MEDIAL MENISCECTOMY VERSUS REPAIR, ANTERIOR CRUCIATE LIGAMENT REPAIR (Left) RECONSTRUCTION ANTERIOR CRUCIATE LIGAMENT (ACL) WITH HAMSTRING GRAFT (Left) as a surgical intervention .  The patient's history has been reviewed, patient examined, no change in status, stable for surgery.  I have reviewed the patient's chart and labs.  Questions were answered to the patient's satisfaction.     Menno Vanbergen D

## 2015-07-23 NOTE — Anesthesia Postprocedure Evaluation (Signed)
Anesthesia Post Note  Patient: Lesle Chrisoelle M Traister  Procedure(s) Performed: Procedure(s) (LRB): LEFT KNEE ARTHROSCOPY WITH PARTIAL MEDIAL MENISCECTOMY  (Left) RECONSTRUCTION ANTERIOR CRUCIATE LIGAMENT (ACL) WITH GRAFTLINK HAMSTRING GRAFT (Left)  Patient location during evaluation: PACU Anesthesia Type: General and Regional Level of consciousness: awake and alert Pain management: pain level controlled Vital Signs Assessment: post-procedure vital signs reviewed and stable Respiratory status: spontaneous breathing, nonlabored ventilation and respiratory function stable Cardiovascular status: blood pressure returned to baseline and stable Postop Assessment: no signs of nausea or vomiting Anesthetic complications: no    Last Vitals:  Filed Vitals:   07/23/15 1515 07/23/15 1530  BP: 124/92 128/86  Pulse: 79 84  Temp:    Resp: 16 15    Last Pain:  Filed Vitals:   07/23/15 1535  PainSc: 4                  Lovell Nuttall,W. EDMOND

## 2015-07-26 ENCOUNTER — Encounter (HOSPITAL_BASED_OUTPATIENT_CLINIC_OR_DEPARTMENT_OTHER): Payer: Self-pay | Admitting: Orthopedic Surgery

## 2015-08-23 ENCOUNTER — Emergency Department (HOSPITAL_COMMUNITY): Payer: Medicaid Other

## 2015-08-23 ENCOUNTER — Emergency Department (HOSPITAL_COMMUNITY)
Admission: EM | Admit: 2015-08-23 | Discharge: 2015-08-23 | Disposition: A | Discharge status: Home / Self Care | Payer: Medicaid Other | Attending: Emergency Medicine | Admitting: Emergency Medicine

## 2015-08-23 ENCOUNTER — Encounter (HOSPITAL_COMMUNITY): Payer: Self-pay | Admitting: Family Medicine

## 2015-08-23 DIAGNOSIS — S6992XA Unspecified injury of left wrist, hand and finger(s), initial encounter: Secondary | ICD-10-CM | POA: Diagnosis present

## 2015-08-23 DIAGNOSIS — F17210 Nicotine dependence, cigarettes, uncomplicated: Secondary | ICD-10-CM | POA: Diagnosis not present

## 2015-08-23 DIAGNOSIS — Z79899 Other long term (current) drug therapy: Secondary | ICD-10-CM | POA: Diagnosis not present

## 2015-08-23 DIAGNOSIS — E119 Type 2 diabetes mellitus without complications: Secondary | ICD-10-CM | POA: Diagnosis not present

## 2015-08-23 DIAGNOSIS — W231XXA Caught, crushed, jammed, or pinched between stationary objects, initial encounter: Secondary | ICD-10-CM | POA: Insufficient documentation

## 2015-08-23 DIAGNOSIS — Z7984 Long term (current) use of oral hypoglycemic drugs: Secondary | ICD-10-CM | POA: Diagnosis not present

## 2015-08-23 DIAGNOSIS — Y998 Other external cause status: Secondary | ICD-10-CM | POA: Diagnosis not present

## 2015-08-23 DIAGNOSIS — Y9289 Other specified places as the place of occurrence of the external cause: Secondary | ICD-10-CM | POA: Diagnosis not present

## 2015-08-23 DIAGNOSIS — E669 Obesity, unspecified: Secondary | ICD-10-CM | POA: Insufficient documentation

## 2015-08-23 DIAGNOSIS — Y9389 Activity, other specified: Secondary | ICD-10-CM | POA: Insufficient documentation

## 2015-08-23 DIAGNOSIS — S63602A Unspecified sprain of left thumb, initial encounter: Secondary | ICD-10-CM | POA: Insufficient documentation

## 2015-08-23 MED ORDER — OXYCODONE-ACETAMINOPHEN 5-325 MG PO TABS
1.0000 | ORAL_TABLET | Freq: Once | ORAL | Status: AC
  Administered 2015-08-23: 1 via ORAL
  Filled 2015-08-23: qty 1

## 2015-08-23 NOTE — ED Notes (Signed)
Martin, NT provided ice pack.
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
## 2015-08-23 NOTE — Discharge Instructions (Signed)
Finger Sprain A finger sprain is a tear in one of the strong, fibrous tissues that connect the bones (ligaments) in your finger. The severity of the sprain depends on how much of the ligament is torn. The tear can be either partial or complete. CAUSES  Often, sprains are a result of a fall or accident. If you extend your hands to catch an object or to protect yourself, the force of the impact causes the fibers of your ligament to stretch too much. This excess tension causes the fibers of your ligament to tear. SYMPTOMS  You may have some loss of motion in your finger. Other symptoms include:  Bruising.  Tenderness.  Swelling. DIAGNOSIS  In order to diagnose finger sprain, your caregiver will physically examine your finger or thumb to determine how torn the ligament is. Your caregiver may also suggest an X-ray exam of your finger to make sure no bones are broken. TREATMENT  If your ligament is only partially torn, treatment usually involves keeping the finger in a fixed position (immobilization) for a short period. To do this, your caregiver will apply a bandage, cast, or splint to keep your finger from moving until it heals. For a partially torn ligament, the healing process usually takes 2 to 3 weeks. If your ligament is completely torn, you may need surgery to reconnect the ligament to the bone. After surgery a cast or splint will be applied and will need to stay on your finger or thumb for 4 to 6 weeks while your ligament heals. HOME CARE INSTRUCTIONS  Keep your injured finger elevated, when possible, to decrease swelling.  To ease pain and swelling, apply ice to your joint twice a day, for 2 to 3 days:  Put ice in a plastic bag.  Place a towel between your skin and the bag.  Leave the ice on for 15 minutes.  Only take over-the-counter or prescription medicine for pain as directed by your caregiver.  Do not wear rings on your injured finger.  Do not leave your finger unprotected  until pain and stiffness go away (usually 3 to 4 weeks).  Do not allow your cast or splint to get wet. Cover your cast or splint with a plastic bag when you shower or bathe. Do not swim.  Your caregiver may suggest special exercises for you to do during your recovery to prevent or limit permanent stiffness. SEEK IMMEDIATE MEDICAL CARE IF:  Your cast or splint becomes damaged.  Your pain becomes worse rather than better. MAKE SURE YOU:  Understand these instructions.  Will watch your condition.  Will get help right away if you are not doing well or get worse.   This information is not intended to replace advice given to you by your health care provider. Make sure you discuss any questions you have with your health care provider.   Document Released: 08/31/2004 Document Revised: 08/14/2014 Document Reviewed: 03/27/2011 Elsevier Interactive Patient Education 2016 [employer]  

## 2015-09-29 ENCOUNTER — Encounter (HOSPITAL_COMMUNITY): Payer: Self-pay

## 2015-09-29 ENCOUNTER — Emergency Department (HOSPITAL_COMMUNITY)
Admission: EM | Admit: 2015-09-29 | Discharge: 2015-09-30 | Discharge status: Against Medical Advice | Payer: Medicaid Other | Attending: Emergency Medicine | Admitting: Emergency Medicine

## 2015-09-29 DIAGNOSIS — Y9389 Activity, other specified: Secondary | ICD-10-CM | POA: Insufficient documentation

## 2015-09-29 DIAGNOSIS — Y92410 Unspecified street and highway as the place of occurrence of the external cause: Secondary | ICD-10-CM | POA: Diagnosis not present

## 2015-09-29 DIAGNOSIS — E119 Type 2 diabetes mellitus without complications: Secondary | ICD-10-CM | POA: Diagnosis not present

## 2015-09-29 DIAGNOSIS — F17210 Nicotine dependence, cigarettes, uncomplicated: Secondary | ICD-10-CM | POA: Insufficient documentation

## 2015-09-29 DIAGNOSIS — Y998 Other external cause status: Secondary | ICD-10-CM | POA: Diagnosis not present

## 2015-09-29 DIAGNOSIS — S3992XA Unspecified injury of lower back, initial encounter: Secondary | ICD-10-CM | POA: Insufficient documentation

## 2015-09-29 DIAGNOSIS — M542 Cervicalgia: Secondary | ICD-10-CM

## 2015-09-29 DIAGNOSIS — E669 Obesity, unspecified: Secondary | ICD-10-CM | POA: Diagnosis not present

## 2015-09-29 DIAGNOSIS — S199XXA Unspecified injury of neck, initial encounter: Secondary | ICD-10-CM | POA: Diagnosis present

## 2015-09-29 DIAGNOSIS — M549 Dorsalgia, unspecified: Secondary | ICD-10-CM
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
## 2015-09-30 ENCOUNTER — Emergency Department (HOSPITAL_COMMUNITY): Payer: Medicaid Other

## 2015-09-30 MED ORDER — IBUPROFEN 600 MG PO TABS: 600.0000 mg | ORAL_TABLET | Freq: Four times a day (QID) | ORAL | Status: DC | PRN
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
## 2015-09-30 NOTE — ED Provider Notes (Signed)
3:02 AM Informed by nurse that patient left prior to CT results. Disposition set to [facility]. Imaging was reviewed after patient left the department; negative for acute findings.  Antony Madura, PA-C 09/30/15 [id number]  Raeford Razor, MD 09/30/15 [phone]
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
## 2015-09-30 NOTE — ED Notes (Signed)
Patient transported to CT 

## 2015-09-30 NOTE — ED Notes (Signed)
Pt states that she has to take her kids to school in the morning and she has to leave. Pt was advised to stay but was told to return if she felt worse or if anything changed.

## 2015-10-29 ENCOUNTER — Emergency Department (HOSPITAL_COMMUNITY): Payer: Medicaid Other

## 2015-10-29 ENCOUNTER — Emergency Department (HOSPITAL_COMMUNITY)
Admission: EM | Admit: 2015-10-29 | Discharge: 2015-10-29 | Disposition: A | Discharge status: Home / Self Care | Payer: Medicaid Other | Attending: Emergency Medicine | Admitting: Emergency Medicine

## 2015-10-29 ENCOUNTER — Encounter (HOSPITAL_COMMUNITY): Payer: Self-pay | Admitting: Emergency Medicine

## 2015-10-29 DIAGNOSIS — S8992XA Unspecified injury of left lower leg, initial encounter: Secondary | ICD-10-CM | POA: Insufficient documentation

## 2015-10-29 DIAGNOSIS — E669 Obesity, unspecified: Secondary | ICD-10-CM | POA: Diagnosis not present

## 2015-10-29 DIAGNOSIS — F17210 Nicotine dependence, cigarettes, uncomplicated: Secondary | ICD-10-CM | POA: Insufficient documentation

## 2015-10-29 DIAGNOSIS — Y92481 Parking lot as the place of occurrence of the external cause: Secondary | ICD-10-CM | POA: Diagnosis not present

## 2015-10-29 DIAGNOSIS — W010XXA Fall on same level from slipping, tripping and stumbling without subsequent striking against object, initial encounter: Secondary | ICD-10-CM | POA: Insufficient documentation

## 2015-10-29 DIAGNOSIS — E119 Type 2 diabetes mellitus without complications: Secondary | ICD-10-CM | POA: Insufficient documentation

## 2015-10-29 DIAGNOSIS — Z79899 Other long term (current) drug therapy: Secondary | ICD-10-CM | POA: Diagnosis not present

## 2015-10-29 DIAGNOSIS — Y9301 Activity, walking, marching and hiking: Secondary | ICD-10-CM | POA: Diagnosis not present

## 2015-10-29 DIAGNOSIS — Y990 Civilian activity done for income or pay: Secondary | ICD-10-CM | POA: Diagnosis not present

## 2015-10-29 DIAGNOSIS — W19XXXA Unspecified fall, initial encounter: Secondary | ICD-10-CM

## 2015-10-29 DIAGNOSIS — M25562 Pain in left knee: Secondary | ICD-10-CM

## 2015-10-29 MED ORDER — IBUPROFEN 600 MG PO TABS: 600.0000 mg | ORAL_TABLET | Freq: Four times a day (QID) | ORAL | Status: DC | PRN
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
## 2015-10-29 NOTE — ED Notes (Signed)
PA at bedside.

## 2015-10-29 NOTE — ED Notes (Signed)
Patient able to ambulate independently  

## 2015-10-29 NOTE — Discharge Instructions (Signed)

## 2015-10-29 NOTE — ED Notes (Signed)
Pt presents to ED for assessment of left knee pain after a fall in the parking lot at work.  Pt states she tripped and fell over the median.  Pt sts she had an ACL repair on the left knee in December 2016.  Swelling noted.  Patient able to ambulate independently.  Denies head injury.

## 2015-10-29 NOTE — ED Notes (Signed)
Pt returned from xray

## 2015-10-29 NOTE — ED Notes (Signed)
Patient states she needs worker's comp information.  Will make registration aware.

## 2015-11-01 ENCOUNTER — Ambulatory Visit: Payer: Medicaid Other | Admitting: Family Medicine

## 2015-11-05 ENCOUNTER — Emergency Department (HOSPITAL_COMMUNITY)
Admission: EM | Admit: 2015-11-05 | Discharge: 2015-11-05 | Disposition: A | Discharge status: Home / Self Care | Payer: Medicaid Other | Attending: Physician Assistant | Admitting: Physician Assistant

## 2015-11-05 ENCOUNTER — Encounter (HOSPITAL_COMMUNITY): Payer: Self-pay | Admitting: Family Medicine

## 2015-11-05 DIAGNOSIS — M545 Low back pain: Secondary | ICD-10-CM | POA: Diagnosis present

## 2015-11-05 DIAGNOSIS — E119 Type 2 diabetes mellitus without complications: Secondary | ICD-10-CM | POA: Diagnosis not present

## 2015-11-05 DIAGNOSIS — F17210 Nicotine dependence, cigarettes, uncomplicated: Secondary | ICD-10-CM | POA: Insufficient documentation

## 2015-11-05 DIAGNOSIS — E860 Dehydration: Secondary | ICD-10-CM | POA: Diagnosis not present

## 2015-11-05 DIAGNOSIS — E669 Obesity, unspecified: Secondary | ICD-10-CM | POA: Insufficient documentation

## 2015-11-05 DIAGNOSIS — N12 Tubulo-interstitial nephritis, not specified as acute or chronic: Secondary | ICD-10-CM | POA: Insufficient documentation

## 2015-11-05 DIAGNOSIS — Z7984 Long term (current) use of oral hypoglycemic drugs: Secondary | ICD-10-CM | POA: Insufficient documentation

## 2015-11-05 DIAGNOSIS — R51 Headache: Secondary | ICD-10-CM | POA: Insufficient documentation

## 2015-11-05 DIAGNOSIS — R000 Tachycardia, unspecified: Secondary | ICD-10-CM | POA: Insufficient documentation

## 2015-11-05 DIAGNOSIS — M542 Cervicalgia: Secondary | ICD-10-CM | POA: Diagnosis not present

## 2015-11-05 DIAGNOSIS — Z79899 Other long term (current) drug therapy: Secondary | ICD-10-CM | POA: Diagnosis not present

## 2015-11-05 DIAGNOSIS — Z3202 Encounter for pregnancy test, result negative: Secondary | ICD-10-CM | POA: Diagnosis not present

## 2015-11-05 LAB — CBC
HEMATOCRIT: 40.6 % (ref 36.0–46.0)
Hemoglobin: 13.3 g/dL (ref 12.0–15.0)
MCH: 29.4 pg (ref 26.0–34.0)
MCHC: 32.8 g/dL (ref 30.0–36.0)
MCV: 89.6 fL (ref 78.0–100.0)
PLATELETS: 187 10*3/uL (ref 150–400)
RBC: 4.53 MIL/uL (ref 3.87–5.11)
RDW: 12.3 % (ref 11.5–15.5)
WBC: 18 10*3/uL — ABNORMAL HIGH (ref 4.0–10.5)

## 2015-11-05 LAB — URINALYSIS, ROUTINE W REFLEX MICROSCOPIC
BILIRUBIN URINE: NEGATIVE
KETONES UR: 15 mg/dL — AB
Nitrite: NEGATIVE
PH: 6 (ref 5.0–8.0)
Protein, ur: NEGATIVE mg/dL
Specific Gravity, Urine: 1.016 (ref 1.005–1.030)

## 2015-11-05 LAB — COMPREHENSIVE METABOLIC PANEL
ALBUMIN: 2.9 g/dL — AB (ref 3.5–5.0)
ALT: 18 U/L (ref 14–54)
AST: 17 U/L (ref 15–41)
Alkaline Phosphatase: 90 U/L (ref 38–126)
Anion gap: 11 (ref 5–15)
BILIRUBIN TOTAL: 1.1 mg/dL (ref 0.3–1.2)
CHLORIDE: 96 mmol/L — AB (ref 101–111)
CO2: 25 mmol/L (ref 22–32)
CREATININE: 0.59 mg/dL (ref 0.44–1.00)
Calcium: 8.3 mg/dL — ABNORMAL LOW (ref 8.9–10.3)
GFR calc Af Amer: >60 mL/min (ref >60)
GFR calc non Af Amer: >60 mL/min (ref >60)
GLUCOSE: 274 mg/dL — AB (ref 65–99)
POTASSIUM: 3.5 mmol/L (ref 3.5–5.1)
Sodium: 132 mmol/L — ABNORMAL LOW (ref 135–145)
TOTAL PROTEIN: 6.1 g/dL — AB (ref 6.5–8.1)

## 2015-11-05 LAB — URINE MICROSCOPIC-ADD ON

## 2015-11-05 LAB — I-STAT BETA HCG BLOOD, ED (MC, WL, AP ONLY): I-stat hCG, quantitative: <5 m[IU]/mL (ref <5)

## 2015-11-05 MED ORDER — ONDANSETRON HCL 4 MG/2ML IJ SOLN
4.0000 mg | Freq: Once | INTRAMUSCULAR | Status: AC
  Administered 2015-11-05: 4 mg via INTRAVENOUS
  Filled 2015-11-05: qty 2

## 2015-11-05 MED ORDER — SODIUM CHLORIDE 0.9 % IV BOLUS (SEPSIS)
1000.0000 mL | Freq: Once | INTRAVENOUS | Status: AC
  Administered 2015-11-05: 1000 mL via INTRAVENOUS

## 2015-11-05 MED ORDER — CEFTRIAXONE SODIUM 1 G IJ SOLR
1.0000 g | Freq: Once | INTRAMUSCULAR | Status: AC
  Administered 2015-11-05: 1 g via INTRAVENOUS
  Filled 2015-11-05: qty 10

## 2015-11-05 MED ORDER — CEPHALEXIN 500 MG PO CAPS: 500.0000 mg | ORAL_CAPSULE | Freq: Three times a day (TID) | ORAL | Status: DC

## 2015-11-05 MED ORDER — KETOROLAC TROMETHAMINE 15 MG/ML IJ SOLN
15.0000 mg | Freq: Once | INTRAMUSCULAR | Status: AC
  Administered 2015-11-05: 15 mg via INTRAVENOUS
  Filled 2015-11-05: qty 1

## 2015-11-05 NOTE — ED Provider Notes (Signed)
CSN: [id number]     Arrival date & time 11/05/15  1516 History   First MD Initiated Contact with Patient 11/05/15 1650     Chief Complaint  Patient presents with  . Back Pain     (Consider location/radiation/quality/duration/timing/severity/associated sxs/prior Treatment) Patient is a 30 y.o. female presenting with dysuria. The history is provided by the patient.  Dysuria Pain quality:  Burning Pain severity:  Moderate Onset quality:  Gradual Duration:  3 days Timing:  Constant Progression:  Worsening Chronicity:  Recurrent (hx of UTI's "feels like same but worse") Relieved by:  Nothing Ineffective treatments:  None tried Urinary symptoms: hesitancy   Associated symptoms: flank pain and nausea   Associated symptoms: no abdominal pain, no fever, no vaginal discharge and no vomiting   Risk factors: not pregnant     Past Medical History  Diagnosis Date  . Obesity   . Non-insulin dependent type 2 diabetes mellitus ([place])   . Left ACL tear 07/2015  . Medial meniscus tear 07/2015    left knee   Past Surgical History  Procedure Laterality Date  . Cesarean section N/A 08/18/2013    Procedure: CESAREAN SECTION;  Surgeon: Donnamae Jude, MD;  Location: [facility];  Service: Obstetrics;  Laterality: N/A;  . Excision bone cyst      gum  . Knee arthroscopy with medial menisectomy Left 07/23/2015    Procedure: LEFT KNEE ARTHROSCOPY WITH PARTIAL MEDIAL MENISCECTOMY ;  Surgeon: Renette Butters, MD;  Location: [facility];  Service: Orthopedics;  Laterality: Left;  . Anterior cruciate ligament repair Left 07/23/2015    Procedure: RECONSTRUCTION ANTERIOR CRUCIATE LIGAMENT (ACL) WITH GRAFTLINK HAMSTRING GRAFT;  Surgeon: Renette Butters, MD;  Location: [place];  Service: Orthopedics;  Laterality: Left;  . Knee arthroscopy with meniscal repair Left 07/23/2015    Procedure: LATERAL MENISCAL REPAIR;  Surgeon: Renette Butters, MD;  Location: [facility];  Service: Orthopedics;  Laterality: Left;   Family History  Problem Relation Age of Onset  . Asthma Mother   . Diabetes Mother   . Alcohol abuse Mother    Social History  Substance Use Topics  . Smoking status: Current Every Day Smoker -- 1.00 packs/day for 10 years    Types: Cigarettes  . Smokeless tobacco: Never Used  . Alcohol Use: No   OB History    Gravida Para Term Preterm AB TAB SAB Ectopic Multiple Living   _0 Review of Systems  Constitutional: Negative for fever, chills, diaphoresis, activity change, appetite change and fatigue.  HENT: Negative for facial swelling, rhinorrhea, sore throat, trouble swallowing and voice change.   Eyes: Negative for photophobia, pain and visual disturbance.  Respiratory: Negative for cough, shortness of breath, wheezing and stridor.   Cardiovascular: Negative for chest pain, palpitations and leg swelling.  Gastrointestinal: Positive for nausea. Negative for vomiting, abdominal pain, constipation and anal bleeding.       Cramping  Endocrine: Negative.   Genitourinary: Positive for dysuria and flank pain. Negative for vaginal bleeding, vaginal discharge, vaginal pain and pelvic pain.  Musculoskeletal: Positive for back pain and neck pain. Negative for myalgias and arthralgias.  Skin: Negative.  Negative for rash.  Allergic/Immunologic: Negative.   Neurological: Positive for headaches. Negative for dizziness, tremors, syncope and weakness.  Psychiatric/Behavioral: Negative for suicidal ideas, sleep disturbance and self-injury.  All other systems reviewed and are negative.  Allergies  Review of patient's allergies indicates no known allergies.  Home Medications   Prior to Admission medications   Medication Sig Start Date End Date Taking? Authorizing Provider  aspirin-acetaminophen-caffeine (EXCEDRIN MIGRAINE) [phone] MG tablet Take 2 tablets by mouth every 6 (six) hours as needed (severe headache).   Yes  Historical Provider, MD  Buprenorphine HCl-Naloxone HCl (SUBOXONE) 8-2 MG FILM Place 1 Film under the tongue 2 (two) times daily.   Yes Historical Provider, MD  glyBURIDE (DIABETA) 2.5 MG tablet Take 2.5 mg by mouth 2 (two) times daily with a meal. 09/01/15  Yes Historical Provider, MD  ibuprofen (ADVIL,MOTRIN) 600 MG tablet Take 1 tablet (600 mg total) by mouth every 6 (six) hours as needed. Patient taking differently: Take 600 mg by mouth every 6 (six) hours as needed (pain).  10/29/15  Yes Montine Circle, PA-C  levonorgestrel (MIRENA) 20 MCG/24HR IUD 1 each by Intrauterine route once. Implanted end of February 2015   Yes Historical Provider, MD  metFORMIN (GLUCOPHAGE) 1000 MG tablet Take 1,000 mg by mouth 2 (two) times daily with a meal.   Yes Historical Provider, MD  Phenazopyridine HCl (AZO STANDARD MAXIMUM STRENGTH PO) Take 2 tablets by mouth 3 (three) times daily as needed (UTI symptoms).   Yes Historical Provider, MD  ACCU-CHEK FASTCLIX LANCETS MISC CHECK BLOOD SUGAR ONCE DAILY EVERY MORNING and if signs of high or low sugar 03/19/14   Hilton Sinclair, MD  Blood Glucose Monitoring Suppl (BLOOD GLUCOSE MONITOR SYSTEM) W/DEVICE KIT Use with Accu-check lancets and strips to test blood sugar daily (fasting and if any symptoms of high or low sugar). 05/07/15   Vivi Barrack, MD  cephALEXin (KEFLEX) 500 MG capsule Take 1 capsule (500 mg total) by mouth 3 (three) times daily. 11/05/15   Margaretann Loveless, MD  docusate sodium (COLACE) 100 MG capsule Take 1 capsule (100 mg total) by mouth 2 (two) times daily. Patient not taking: Reported on 09/29/2015 07/23/15   Lovett Calender, PA-C  glucose blood (ACCU-CHEK AVIVA) test strip Use as instructed 03/19/14   Hilton Sinclair, MD  methocarbamol (ROBAXIN) 500 MG tablet Take 1 tablet (500 mg total) by mouth 4 (four) times daily. Patient not taking: Reported on 09/29/2015 07/23/15   Lovett Calender, PA-C  ondansetron (ZOFRAN) 4 MG tablet Take 1 tablet (4 mg  total) by mouth every 8 (eight) hours as needed for nausea or vomiting. Patient not taking: Reported on 09/29/2015 07/23/15   Lovett Calender, PA-C  oxyCODONE-acetaminophen (PERCOCET) 5-325 MG tablet Take 1-2 tablets by mouth every 4 (four) hours as needed for severe pain. Patient not taking: Reported on 09/29/2015 07/23/15   Lovett Calender, PA-C   BP 120/85 mmHg  Pulse 109  Temp(Src) 100.6 F (38.1 C) (Oral)  Resp 18  Ht _0  (1.626 m)  Wt 115.242 kg  BMI 43.59 kg/m2  SpO2 93% Physical Exam  Constitutional: She is oriented to person, place, and time. She appears well-developed and well-nourished. No distress.  HENT:  Head: Normocephalic and atraumatic.  Right Ear: External ear normal.  Left Ear: External ear normal.  Mouth/Throat: No oropharyngeal exudate.  Appears mildly dehydrated   Eyes: Conjunctivae and EOM are normal. Pupils are equal, round, and reactive to light. No scleral icterus.  Neck: Normal range of motion. Neck supple. No JVD present. No tracheal deviation present. No thyromegaly present.  Cardiovascular: Intact distal pulses.   Regular tachycardia to low 110's  Pulmonary/Chest: Effort normal and breath sounds normal. No respiratory distress.  She has no wheezes. She has no rales.  Abdominal: Soft. Bowel sounds are normal. She exhibits no distension. There is no tenderness (No abdominal tenderness, mild CVA and paraspinal lumber back tenderness).  Musculoskeletal: Normal range of motion. She exhibits tenderness (bilateral lumbar back tenderness). She exhibits no edema.  Neurological: She is alert and oriented to person, place, and time. No cranial nerve deficit. She exhibits normal muscle tone. Coordination normal.  5/5 strength in all 4 extremities. Sensation intact and normal in all 4 extremities. Normal gait. Normal finger to nose and heel to shin. Negative romberg.   Skin: Skin is warm and dry. She is not diaphoretic. No pallor.  Psychiatric: She has a normal mood and  affect. She expresses no homicidal and no suicidal ideation. She expresses no suicidal plans and no homicidal plans.  Nursing note and vitals reviewed.   ED Course  Procedures (including critical care time) Labs Review Labs Reviewed  COMPREHENSIVE METABOLIC PANEL - Abnormal; Notable for the following:    Sodium 132 (*)    Chloride 96 (*)    Glucose, Bld 274 (*)    BUN <5 (*)    Calcium 8.3 (*)    Total Protein 6.1 (*)    Albumin 2.9 (*)    All other components within normal limits  CBC - Abnormal; Notable for the following:    WBC 18.0 (*)    All other components within normal limits  URINALYSIS, ROUTINE W REFLEX MICROSCOPIC (NOT AT [facility] - Abnormal; Notable for the following:    APPearance CLOUDY (*)    Glucose, UA >1000 (*)    Hgb urine dipstick MODERATE (*)    Ketones, ur 15 (*)    Leukocytes, UA MODERATE (*)    All other components within normal limits  URINE MICROSCOPIC-ADD ON - Abnormal; Notable for the following:    Squamous Epithelial / LPF 0-5 (*)    Bacteria, UA RARE (*)    All other components within normal limits  URINE CULTURE  I-STAT BETA HCG BLOOD, ED ([facility], [facility], [facility] ONLY)    Imaging Review No results found. I have personally reviewed and evaluated these images and lab results as part of my medical decision-making.   EKG Interpretation None      MDM   Final diagnoses:  Pyelonephritis    The patient is a 30 year old female who presents with 3 days of dysuria which has progressed to lower back pain, cramping, headache, and nausea. On arrival patient is afebrile and is nontoxic appearing but does appear mildly dehydrated. Abdominal exam benign except for bilateral CVA tenderness. Patient does not have a history of a kidney stone and I feel the history is far more concerning for urinary tract infection then nephrolithiasis. Labs show a leukocytosis of 18.0. Feel patient has pyelonephritis however as she is non-toxic appearing and tolerating by mouth well feel  she is appropriate for outpatient treatment. I do not suspect PID as patient denies any vaginal discharge or bleeding and states this feels like prior UTI's. Patient is given 1 dose of IV antibiotics in the emergency department as well as IV fluids with improvement in symptoms. Patient discharged home with by mouth antibiotics, primary care follow-up, and standard ED return precautions. Patient's versus understanding and agreement with this plan.  Patient seen with attending, Dr. Sandi Mariscal, who oversaw clinical decision making.      Margaretann Loveless, MD 11/05/15 2000  Courteney Julio Alm, MD 11/06/15 [id number]

## 2015-11-05 NOTE — ED Notes (Signed)
Pt here for lower back pain, cramping, headache, neck pain, nausea. sts she feels dehydrated.

## 2015-11-05 NOTE — Discharge Instructions (Signed)
Pyelonephritis, Adult °Pyelonephritis is a kidney infection. The kidneys are organs that help clean your blood by moving waste out of your blood and into your pee (urine). This infection can happen quickly, or it can last for a long time. In most cases, it clears up with treatment and does not cause other problems. °HOME CARE °Medicines °· Take over-the-counter and prescription medicines only as told by your doctor. °· Take your antibiotic medicine as told by your doctor. Do not stop taking the medicine even if you start to feel better. °General Instructions °· Drink enough fluid to keep your pee clear or pale yellow. °· Avoid caffeine, tea, and carbonated drinks. °· Pee (urinate) often. Avoid holding in pee for long periods of time. °· Pee before and after sex. °· After pooping (having a bowel movement), women should wipe from front to back. Use each tissue only once. °· Keep all follow-up visits as told by your doctor. This is important. °GET HELP IF: °· You do not feel better after 2 days. °· Your symptoms get worse. °· You have a fever. °GET HELP RIGHT AWAY IF: °· You cannot take your medicine or drink fluids as told. °· You have chills and shaking. °· You throw up (vomit). °· You have very bad pain in your side (flank) or back. °· You feel very weak or you pass out (faint). °  °This information is not intended to replace advice given to you by your health care provider. Make sure you discuss any questions you have with your health care provider. °  °Document Released: 08/31/2004 Document Revised: 04/14/2015 Document Reviewed: 11/16/2014 °Elsevier Interactive Patient Education ©2016 [employer] ° °

## 2015-11-05 NOTE — ED Notes (Signed)
Pt A&Ox4, ambulatory at d/c with steady gait, NAD 

## 2015-11-08 LAB — URINE CULTURE: Culture: >=100000

## 2015-11-09 ENCOUNTER — Telehealth: Payer: Self-pay | Admitting: *Deleted

## 2015-11-09 NOTE — ED Notes (Signed)
Post ED Visit - Positive Culture Follow-up  Culture report reviewed by antimicrobial stewardship pharmacist:  []  Enzo BiNathan Batchelder, Pharm.D. []  Celedonio MiyamotoJeremy Frens, Pharm.D., BCPS []  Garvin FilaMike Maccia, Pharm.D. []  Georgina PillionElizabeth Martin, Pharm.D., BCPS []  Lebanon JunctionMinh Pham, [address]Pharm[address]D., BCPS, AAHIVP []  Estella HuskMichelle Turner, Pharm.D., BCPS, AAHIVP []  Tennis Mustassie Stewart, Pharm.D. []  Sherle Poeob Vincent, [address]Ph[address] Meagan Arvilla MarketMills, Pharm D  Positive urine culture Treated with Cephalexin, organism sensitive to the same and no further patient follow-up is required at this time.  Virl AxeRobertson, Micalah Cabezas [facility] 11/09/2015, 10:08 AM

## 2015-11-12 ENCOUNTER — Ambulatory Visit (INDEPENDENT_AMBULATORY_CARE_PROVIDER_SITE_OTHER): Payer: Medicaid Other | Admitting: Internal Medicine

## 2015-11-12 VITALS — Temp 98.1°F | Ht 64.0 in | Wt 249.8 lb

## 2015-11-12 DIAGNOSIS — E118 Type 2 diabetes mellitus with unspecified complications: Secondary | ICD-10-CM | POA: Diagnosis not present

## 2015-11-12 DIAGNOSIS — B3731 Acute candidiasis of vulva and vagina: Secondary | ICD-10-CM

## 2015-11-12 DIAGNOSIS — Z0000 Encounter for general adult medical examination without abnormal findings: Secondary | ICD-10-CM | POA: Diagnosis not present

## 2015-11-12 DIAGNOSIS — B373 Candidiasis of vulva and vagina: Secondary | ICD-10-CM | POA: Diagnosis not present

## 2015-11-12 LAB — POCT GLYCOSYLATED HEMOGLOBIN (HGB A1C): Hemoglobin A1C: 9.9

## 2015-11-12 MED ORDER — GLUCOSE BLOOD VI STRP: ORAL_STRIP | Status: DC

## 2015-11-12 MED ORDER — FLUCONAZOLE 150 MG PO TABS: 150.0000 mg | ORAL_TABLET | Freq: Once | ORAL | Status: DC

## 2015-11-12 MED ORDER — BLOOD GLUCOSE MONITOR SYSTEM W/DEVICE KIT: PACK | Status: DC

## 2015-11-12 MED ORDER — METFORMIN HCL 1000 MG PO TABS: 1000.0000 mg | ORAL_TABLET | Freq: Two times a day (BID) | ORAL | Status: DC

## 2015-11-12 MED ORDER — ACCU-CHEK FASTCLIX LANCETS MISC: Status: DC

## 2015-11-12 MED ORDER — GLYBURIDE 2.5 MG PO TABS: 2.5000 mg | ORAL_TABLET | Freq: Two times a day (BID) | ORAL | Status: DC

## 2015-11-12 NOTE — Patient Instructions (Signed)
Thank you for coming.   - please start taking the metformin and glyburide - try to check your blood sugar (fasting)  - make a follow up appointment in 1 month to further discuss diabetes, allergies, and urinary issues. You will also get your pneumonia shot at that time.  - make a lab appointment to get your cholesterol checked (fasting)

## 2015-11-12 NOTE — Progress Notes (Signed)
Patient ID: Krystal [name], female   DOB: 12/21/1985, 30 y.o.   MRN: [id number] Date of Visit: 11/12/2015   HPI:  Patient presents today for a well woman exam.   Concerns today:  - yeast infection: every time she gets antibiotics, vaginal itching but no discharge. Is completing antibiotics for pyelnephritis - DM: reports she has not taken medications "for a while" after leaving her ex-husband. She also does not have glucometer and equipment to check CBGs as she lost these as she was moving out after separating from ex-husband.    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  Mood: PHQ9 score 2, symptoms "not difficult" to manage Dentist: is planning on making an appointment soon   Eyes: no eye doctor; is planning on establishing care soon  ROS: See HPI  PMFSH:  PMH, FH, and SH reviewed and updated    PHYSICAL EXAM: Temp(Src) 98.1 F (36.7 C) (Oral)  Ht 5\' 4"  (1.626 m)  Wt 249 lb 12.8 oz (113.309 kg)  BMI 42.86 kg/m2 BP: 143/86 Gen: NAD, pleasant, cooperative HEENT: NCAT, PERRL, no palpable thyromegaly or anterior cervical lymphadenopathy Heart: RRR, no murmurs Lungs: CTAB, NWOB Abdomen: soft, nontender to  palpation Neuro: grossly nonfocal, speech normal  ASSESSMENT/PLAN:  # Health maintenance:  -STD screening: declined -pap smear: not due until 2019 -lipid screening: ordered as a future order -immunizations: is due to PNA 23V due to history of DM; will administer at DM follow up  - Patient's goals for healthier lifestyle: Eating smaller portions, eating 3 meals a day with healthy snack; walking 20 mins a day three times a week.   Diabetes mellitus Not controlled. A1c 9.9 today. - reordered Metformin and Glyburide  - reordered equipment so that patient is able to check CBGs. Asked to check CBGs fasting regularly after starting medication - follow up in 1 month to determine if CBGs are controlled with medication regimen - will need foot exam, urine microalbumin at that time    FOLLOW UP: in 1 month for DM   Palma HolterKanishka G Ace Bergfeld, MD  PGY 1 Family Medicine

## 2015-11-14 ENCOUNTER — Encounter: Payer: Self-pay | Admitting: Internal Medicine

## 2015-11-14 NOTE — Assessment & Plan Note (Addendum)
Not controlled. A1c 9.9 today. - reordered Metformin and Glyburide  - reordered equipment so that patient is able to check CBGs. Asked to check CBGs fasting regularly after starting medication - follow up in 1 month to determine if CBGs are controlled with medication regimen - will need foot exam, urine microalbumin at that time as well as PNA 23V vaccine

## 2015-11-17 ENCOUNTER — Other Ambulatory Visit: Payer: Self-pay | Admitting: Internal Medicine

## 2015-11-17 NOTE — Telephone Encounter (Signed)
Rx filled.  Katina Degreealeb M. Jimmey RalphParker, MD [facility] Resident PGY-2 11/17/2015 1:16 PM

## 2015-11-30 ENCOUNTER — Other Ambulatory Visit: Payer: Self-pay

## 2015-12-06 ENCOUNTER — Ambulatory Visit (INDEPENDENT_AMBULATORY_CARE_PROVIDER_SITE_OTHER): Payer: Medicaid Other | Admitting: Family Medicine

## 2015-12-06 ENCOUNTER — Encounter: Payer: Self-pay | Admitting: Family Medicine

## 2015-12-06 VITALS — BP 130/71 | HR 88 | Temp 97.8°F | Wt 252.0 lb

## 2015-12-06 DIAGNOSIS — B3731 Acute candidiasis of vulva and vagina: Secondary | ICD-10-CM

## 2015-12-06 DIAGNOSIS — L559 Sunburn, unspecified: Secondary | ICD-10-CM

## 2015-12-06 DIAGNOSIS — B373 Candidiasis of vulva and vagina: Secondary | ICD-10-CM

## 2015-12-06 DIAGNOSIS — H6691 Otitis media, unspecified, right ear: Secondary | ICD-10-CM | POA: Diagnosis present

## 2015-12-06 DIAGNOSIS — J309 Allergic rhinitis, unspecified: Secondary | ICD-10-CM | POA: Diagnosis not present

## 2015-12-06 MED ORDER — FLUCONAZOLE 150 MG PO TABS: 150.0000 mg | ORAL_TABLET | Freq: Once | ORAL | Status: DC

## 2015-12-06 MED ORDER — AMOXICILLIN 500 MG PO CAPS: 500.0000 mg | ORAL_CAPSULE | Freq: Three times a day (TID) | ORAL | Status: DC

## 2015-12-06 MED ORDER — FLUTICASONE PROPIONATE 50 MCG/ACT NA SUSP: 2.0000 | Freq: Every day | NASAL | Status: DC

## 2015-12-06 NOTE — Patient Instructions (Signed)
Thank you for coming in to clinic today.  1. It does look like you have a Right ear infection, this may be related to allergies and sinuses, also with Eustachian Tube Dysfunction - Take Amoxicillin 500mg  3 times a day (every 8 hours) for 10 days - May take Diflucan if you get a yeast infection after antibiotics - Start Flonase 2 sprays in each nostril for up to 1 month, maybe need longer, steroid nose spray takes a while to work - Try Simply Saline nasal spray to flush sinuses - may try Claritin or Zyrtec once daily  2. Use Aloe or other healing skin product for sunburn, will heal  If ear pain worsens, or new worsening symptoms with sinus pressure / headache, nausea, vomiting or new concerns please return for re-evaluation  Please schedule a follow-up appointment with Dr Jimmey RalphParker within 2 weeks to follow-up Right Ear Infection / Allergies/Sinuses if NOT improved, otherwise follow-up as needed   If you have any other questions or concerns, please feel free to call the clinic to contact me. You may also schedule an earlier appointment if necessary.  However, if your symptoms get significantly worse, please go to the Emergency Department to seek immediate medical attention.  Saralyn PilarAlexander Cray Monnin, DO [facility]

## 2015-12-06 NOTE — Progress Notes (Signed)
Subjective:    Patient ID: Krystal [name], female    DOB: [birth year], 30 y.o.   MRN: [id number]  Krystal Chrisoelle M Duris is a 30 y.o. female presenting on 12/06/2015 for Ear Pain and Sunburn   Patient presents for a same day appointment.   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 On way home she noticed that her Right ear started to hurt, also she was having symptoms with soreness on skin on her back from sunburn and sore muscles all over. Primary concern is Right ear pain, described as constant, up to 6-7/10 then up to 10/10 at worst, unclear what worsens it, admits to associated ear popping - Tried sweet oil into ear without relief, Ibuprofen 600mg  x2 per one dose with some notable improvement allowed her to go to sleep and did last >6 hours but she woke up overnight - Not taking allergy medicine - Recent antibiotics with Kelflex in 11/05/2015 and Diflucan for yeast infection 11/2015. Admits history of recurrent ear infections, last 05/2015 Left ear AOM resolved with Amoxicillin - Admits nausea (without vomiting), muffled R-ear hearing only, sinus congestion, watery / itchy eyes - Denies any fever/chills, sweating, hearing loss, headache or sinus pressure   Social History  Substance Use Topics  . Smoking status: Current Every Day Smoker -- 1.00 packs/day for 10 years    Types: Cigarettes  . Smokeless tobacco: Never Used  . Alcohol Use: No    Review of Systems Per HPI unless specifically indicated above     Objective:    BP 130/71 mmHg  Pulse 88  Temp(Src) 97.8 F (36.6 C) (Oral)  Wt 252 lb (114.306 kg)  Wt Readings from Last 3 Encounters:  12/06/15 252 lb (114.306 kg)  11/12/15 249 lb 12.8 oz (113.309 kg)  11/05/15 254 lb 1 oz (115.242 kg)    Physical Exam    Constitutional: She is oriented to person, place, and time. She appears well-developed and well-nourished. No distress.  Well-appearing, Right ear discomfort, cooperative  HENT:  Head: Normocephalic and atraumatic.  Mouth/Throat: Oropharynx is clear and moist.  Frontal and maxillary sinuses non-tender. Bilateral nares with turbinate edema and erythema some congestion non purulent. Right TM erythematous and full with some opaque effusion, Left TM normal landmarks clear without erythema, bulging, minimal effusion. Bilateral mastoids non-tender.  Eyes: Conjunctivae are normal. Right eye exhibits no discharge. Left eye exhibits no discharge.  Neck: Normal range of motion. Neck supple. No thyromegaly present.  Cardiovascular: Normal rate.   Pulmonary/Chest: Effort normal.  Lymphadenopathy:    She has no cervical adenopathy.  Neurological: She is alert and oriented to person, place, and time.  Skin: Skin is warm and dry. She is not diaphoretic.  Extensive sunburn without blistering or ulceration, mostly on back, upper extremities, and face, tops of ears  Nursing note and vitals reviewed.      Assessment & Plan:   Problem List Items Addressed This Visit    None    Visit Diagnoses    Acute right otitis media, recurrence not specified, unspecified otitis media type    -  Primary    Relevant Medications    amoxicillin (AMOXIL) 500 MG capsule    fluconazole (DIFLUCAN) 150 MG tablet    Allergic rhinitis, unspecified allergic  rhinitis type        Relevant Medications    fluticasone (FLONASE) 50 MCG/ACT nasal spray    Sunburn        Extensive but mild without evidence of blistering or complication. Topical aloe and conservative care.    Vaginal yeast infection        Relevant Medications    fluconazole (DIFLUCAN) 150 MG tablet       Consistent with early R AOM on exam with effusion, in setting of allergic rhinitis / sinusitis symptoms. Prior recurrent AOM, last antibiotics 10/2015 for UTI.  Currently afebrile, well-appearing and non-toxic, well hydrated on exam. Not consistent with bacterial sinusitis given acute onset, afebrile and no purulence.  Plan: 1. Start Amoxicillin  TID x 10 days 2. Start Flonase, may also add OTC claritin/zyrtec 3. Tylenol / Ibuprofen PRN 4. Improve hydration 5. Precautionary rx DIflucan given with prior yeast infections on amoxcillin 6. Return criteria given    Meds ordered this encounter  Medications  . amoxicillin (AMOXIL) 500 MG capsule    Sig: Take 1 capsule (500 mg total) by mouth 3 (three) times daily.    Dispense:  30 capsule    Refill:  0  . fluconazole (DIFLUCAN) 150 MG tablet    Sig: Take 1 tablet (150 mg total) by mouth once. If yeast infection, after antibiotic. Repeat dose in 48-72 hour if still symptoms.    Dispense:  2 tablet    Refill:  0  . fluticasone (FLONASE) 50 MCG/ACT nasal spray    Sig: Place 2 sprays into both nostrils daily. Up to 1 month then may continue as needed.    Dispense:  16 g    Refill:  2      Follow up plan: Return in about 2 weeks (around 12/20/2015), or if symptoms worsen or fail to improve, for R-AOM, allergic rhinitis.  Saralyn Pilar, DO [facility], PGY-3

## 2015-12-09 ENCOUNTER — Other Ambulatory Visit: Payer: Self-pay | Admitting: Internal Medicine

## 2015-12-09 NOTE — Telephone Encounter (Signed)
Pt has an apt with you 5/8, please advise.

## 2015-12-09 NOTE — Telephone Encounter (Signed)
If pt calls back I called her to left her know her rx she requested to be refilled is ready at the pharmacy. Page, cma.

## 2015-12-13 ENCOUNTER — Ambulatory Visit: Payer: Self-pay | Admitting: Internal Medicine

## 2016-01-26 ENCOUNTER — Other Ambulatory Visit: Payer: Self-pay | Admitting: Family Medicine

## 2016-01-26 NOTE — Telephone Encounter (Signed)
Rx filled.  Katina Degreealeb M. Jimmey RalphParker, MD [facility] Resident PGY-2 01/26/2016 11:01 AM

## 2016-04-20 ENCOUNTER — Other Ambulatory Visit: Payer: Self-pay | Admitting: *Deleted

## 2016-04-20 MED ORDER — METFORMIN HCL 1000 MG PO TABS: 1000.0000 mg | ORAL_TABLET | Freq: Two times a day (BID) | ORAL | 0 refills | Status: DC

## 2016-04-20 NOTE — Telephone Encounter (Signed)
Scheduled patient for 05-05-16. Zhara Gieske,CMA

## 2016-04-20 NOTE — Telephone Encounter (Signed)
30 day supply prescribed. Patient needs office appointment for further refills.  Katina Degreealeb M. Jimmey RalphParker, MD [facility] Resident PGY-3 04/20/2016 3:02 PM

## 2016-05-05 ENCOUNTER — Ambulatory Visit: Payer: Self-pay | Admitting: Family Medicine

## 2016-06-13 ENCOUNTER — Other Ambulatory Visit: Payer: Self-pay | Admitting: *Deleted

## 2016-06-13 MED ORDER — METFORMIN HCL 1000 MG PO TABS: 1000.0000 mg | ORAL_TABLET | Freq: Two times a day (BID) | ORAL | 3 refills | Status: DC

## 2016-06-13 NOTE — Telephone Encounter (Signed)
Patient scheduled for 06/28/16 at 8:30am. [facility] FJazmin Hicks Feick,CMA

## 2016-06-28 ENCOUNTER — Ambulatory Visit: Payer: Self-pay | Admitting: Family Medicine

## 2016-09-01 ENCOUNTER — Other Ambulatory Visit (HOSPITAL_COMMUNITY)
Admission: RE | Admit: 2016-09-01 | Discharge: 2016-09-01 | Disposition: A | Discharge status: Home / Self Care | Payer: Medicaid Other | Source: Ambulatory Visit | Attending: Family Medicine | Admitting: Family Medicine

## 2016-09-01 ENCOUNTER — Ambulatory Visit (INDEPENDENT_AMBULATORY_CARE_PROVIDER_SITE_OTHER): Payer: Medicaid Other | Admitting: Internal Medicine

## 2016-09-01 VITALS — BP 100/68 | HR 99 | Temp 98.3°F | Ht 64.0 in | Wt 263.8 lb

## 2016-09-01 DIAGNOSIS — Z113 Encounter for screening for infections with a predominantly sexual mode of transmission: Secondary | ICD-10-CM | POA: Diagnosis present

## 2016-09-01 DIAGNOSIS — N6452 Nipple discharge: Secondary | ICD-10-CM

## 2016-09-01 DIAGNOSIS — Z3200 Encounter for pregnancy test, result unknown: Secondary | ICD-10-CM | POA: Diagnosis present

## 2016-09-01 DIAGNOSIS — N898 Other specified noninflammatory disorders of vagina: Secondary | ICD-10-CM | POA: Diagnosis not present

## 2016-09-01 LAB — POCT WET PREP (WET MOUNT)
CLUE CELLS WET PREP WHIFF POC: NEGATIVE
TRICHOMONAS WET PREP HPF POC: ABSENT

## 2016-09-01 LAB — POCT URINE PREGNANCY: Preg Test, Ur: NEGATIVE

## 2016-09-01 NOTE — Patient Instructions (Signed)
-  I will call you with the results

## 2016-09-01 NOTE — Progress Notes (Signed)
[facility] Phone: [phone]   Date of Visit: 09/01/2016   HPI:  Krystal [name] is a 31 y.o. female presenting to clinic today for same day appointment. PCP: Jacquiline Doe, MD Concerns today include:  Patient presents with symptoms of nausea with intermittent vomiting, heartburn, intermittent headaches, breast tenderness with which discharge from both breast that looks like milk for about 1 month. No bloody discharge. No abnormal bumps on breasts.  - She does have an IUD in place so she does not have periods. She may have intermittent spotting but has not had any this month.  - She reports that she usually gets theses symptoms when she is pregnant. Reports that it took her later in her prior pregnancies to be diagnosed a pregnancy. "my doctors kept telling me I was not pregnant until later on". - nausea is mainly in the morning and she has emesis mainly in the morning and sometimes in the evening - her HA she describes as frontal HA that is intermittent and throbbing without neck stiffness, photophobia, phonophobia, or blurred vision. Has had HA 4-5 times this month (her HA are usually not as frequent). Does not wake her up from her sleep. No issues with ambulating.  - Denies recent illness with rhinorrhea, sore throat, cough, or diarrhea.  - also reports of heartburn/reflux which is relieved by TUMS - no abdominal pain. Reports of vaginal discharge and itching of the vulvar area intermittently for the past month. She thinks she has a yeast infection. No dysuria or increased urinary frequency.  - is sexually active and does not use condoms. Has been with current partner for a little over a year - reports that she always has the above symptoms every time she has been pregnant.    ROS: See HPI.  PMFSH:  PMH: DM2 Depression Obesity Tobacco Dependence   PHYSICAL EXAM: BP 100/68   Pulse 99   Temp 98.3 F (36.8 C) (Oral)   Ht 5\' 4"  (1.626 m)   Wt 263  lb 12.8 oz (119.7 kg)   SpO2 96%   BMI 45.28 kg/m  GEN: NAD CV: RRR, no murmurs, rubs, or gallops Breast exam: normal bilaterally  PULM: CTAB, normal effort ABD: Soft, nontender, nondistended, NABS, no organomegaly GU: Female genitalia: external genitalia has mild erythema of labia majora; normal  vagina, cervix, uterus and adnexa. IUD string visualized coming from the cervical os. Slight white discharge slightly thick.  SKIN: No rash or cyanosis; warm and well-perfused EXTR: No lower extremity edema or calf tenderness PSYCH: Mood and affect euthymic, normal rate and volume of speech NEURO: Awake, alert, no focal deficits grossly, normal speech  ASSESSMENT/PLAN:  1. Possible pregnancy Urine pregnancy test is negative. Due to symptoms and her prior history, will obtain serum hcg.  - POCT urine pregnancy - hCG, serum, qualitative  2. Vaginal discharge Clinically she possibly has vulvovaginal candidiasis, but wet prep is unremarkable. Discussed with patient over the phone, and she reports sometimes she does have redness after shaving. We discussed treating anyway due to clinical signs vs not treating AND if she would like to try treatment, waiting until serum hcg returns to see if she will be able to take oral medication vs treating with vaginal clotrimazole. Patient opted to wait until serum hcg returns. Gc/chlamydia also obtained.  - POCT Wet Prep [facility]) - Cervicovaginal ancillary only  3. Discharge of breast Normal breast exam today. With HA and discharge from breast, with obtain prolactin level.  -  Prolactin   Palma HolterKanishka G Lamyiah Crawshaw, MD PGY 2 [facility]

## 2016-09-02 LAB — HCG, SERUM, QUALITATIVE: Preg, Serum: NEGATIVE

## 2016-09-02 LAB — PROLACTIN: Prolactin: 6.2 ng/mL

## 2016-09-05 ENCOUNTER — Telehealth: Payer: Self-pay | Admitting: Internal Medicine

## 2016-09-05 DIAGNOSIS — Z3200 Encounter for pregnancy test, result unknown: Secondary | ICD-10-CM

## 2016-09-05 LAB — CERVICOVAGINAL ANCILLARY ONLY
Chlamydia: NEGATIVE
Neisseria Gonorrhea: NEGATIVE

## 2016-09-05 NOTE — Telephone Encounter (Signed)
Called patient to report of lab results.  Discussed with Dr. Lum BabeEniola regarding patient's history and lab results. Recommend repeating serum beta hcg on Friday. Patient agreeable with plan. Will order future lab. Patient to call clinic to make lab appointment for Friday.

## 2016-09-08 ENCOUNTER — Other Ambulatory Visit: Payer: Medicaid Other

## 2016-09-08 DIAGNOSIS — Z0000 Encounter for general adult medical examination without abnormal findings: Secondary | ICD-10-CM

## 2016-09-08 DIAGNOSIS — Z3200 Encounter for pregnancy test, result unknown: Secondary | ICD-10-CM

## 2016-09-08 LAB — LIPID PANEL
Cholesterol: 100 mg/dL (ref <200)
HDL: 37 mg/dL — ABNORMAL LOW (ref >50)
LDL CALC: 54 mg/dL (ref <100)
Total CHOL/HDL Ratio: 2.7 Ratio (ref <5.0)
Triglycerides: 43 mg/dL (ref <150)
VLDL: 9 mg/dL (ref <30)

## 2016-09-09 LAB — HCG, SERUM, QUALITATIVE: PREG SERUM: NEGATIVE

## 2016-09-12 ENCOUNTER — Telehealth: Payer: Self-pay | Admitting: Internal Medicine

## 2016-09-12 NOTE — Telephone Encounter (Signed)
Left message for pt to return my phonecall.

## 2016-09-12 NOTE — Telephone Encounter (Signed)
Please call patient to report her repeat blood pregnancy test was negative, and her cholesterol is overall good.

## 2016-09-12 NOTE — Telephone Encounter (Signed)
Pt informed of negative blood pregnancy result.

## 2016-09-14 ENCOUNTER — Ambulatory Visit (INDEPENDENT_AMBULATORY_CARE_PROVIDER_SITE_OTHER): Payer: Medicaid Other | Admitting: Family Medicine

## 2016-09-14 ENCOUNTER — Encounter: Payer: Self-pay | Admitting: Family Medicine

## 2016-09-14 VITALS — BP 114/74 | HR 94 | Temp 98.1°F | Ht 64.0 in | Wt 267.8 lb

## 2016-09-14 DIAGNOSIS — N760 Acute vaginitis: Secondary | ICD-10-CM

## 2016-09-14 DIAGNOSIS — Z23 Encounter for immunization: Secondary | ICD-10-CM | POA: Diagnosis not present

## 2016-09-14 DIAGNOSIS — Z30432 Encounter for removal of intrauterine contraceptive device: Secondary | ICD-10-CM | POA: Diagnosis not present

## 2016-09-14 MED ORDER — FLUCONAZOLE 150 MG PO TABS: 150.0000 mg | ORAL_TABLET | Freq: Once | ORAL | 0 refills | Status: AC

## 2016-09-14 MED ORDER — NORETHINDRONE ACET-ETHINYL EST 1.5-30 MG-MCG PO TABS: 1.0000 | ORAL_TABLET | Freq: Every day | ORAL | 11 refills | Status: DC

## 2016-09-14 NOTE — Patient Instructions (Signed)
Your IUD was removed successfully. We will send a new birth control prescription to your pharmacy. You may start using it imeediately. Call if you have any question.

## 2016-09-14 NOTE — Progress Notes (Signed)
IUD Removal Procedure Note  Pre-operative Diagnosis: IUD removal  Post-operative Diagnosis: same  Indications: contraception  Procedure Details  Urine pregnancy test was not done.  The risks (including infection, bleeding, pain, and uterine perforation) and benefits of the procedure were explained to the patient and Verbal and written informed consent was obtained.    Mirena string visible and wasclamped using a ring forcep. The whole mirena was then pulled out without difficulty through her cervical os. Patient tolerated procedure well. Procedure was completed by Dr. Nelson ChimesAmin under my direct supervision. Patient requested OCP for birth control. She also endorsed having burning and discharge suggestive of yeast infection. She had neg wet prep done recently but she stated she knows how she feels with yeast infection. She requested treatment for this.  IUD Information: NA.  Condition: Stable  Complications: None  Plan:  The patient was advised to call for any fever or for prolonged or severe pain or bleeding. She was advised to use OTC acetaminophen as needed for mild to moderate pain.  OCP Escribed. May start using immediately. Diflucan 150 mg x 1 dose prescribed for presumed yeast infection. Return precaution discussed. Flu shot given today.  Attending Physician Documentation: I was present for or participated in the entire procedure, including opening and closing.

## 2016-09-27 ENCOUNTER — Other Ambulatory Visit: Payer: Self-pay | Admitting: *Deleted

## 2016-09-27 MED ORDER — METFORMIN HCL 1000 MG PO TABS: 1000.0000 mg | ORAL_TABLET | Freq: Two times a day (BID) | ORAL | 3 refills | Status: DC

## 2016-09-27 NOTE — Telephone Encounter (Signed)
Patient made an appt for 10-09-16. Mikylah Ackroyd,CMA

## 2016-09-27 NOTE — Telephone Encounter (Signed)
Rx filled. Patient needs appointment to have A1c checked.  Katina Degreealeb M. Jimmey RalphParker, MD [facility] Resident PGY-3 09/27/2016 11:04 AM ,[

## 2016-10-09 ENCOUNTER — Ambulatory Visit: Payer: Medicaid Other | Admitting: Family Medicine

## 2017-01-02 ENCOUNTER — Ambulatory Visit: Payer: Medicaid Other | Admitting: Family Medicine

## 2017-01-29 ENCOUNTER — Other Ambulatory Visit: Payer: Self-pay | Admitting: *Deleted

## 2017-01-29 MED ORDER — FLUTICASONE PROPIONATE 50 MCG/ACT NA SUSP: NASAL | 11 refills | Status: DC

## 2017-02-27 ENCOUNTER — Other Ambulatory Visit: Payer: Self-pay | Admitting: *Deleted

## 2017-02-27 MED ORDER — FLUTICASONE PROPIONATE 50 MCG/ACT NA SUSP: NASAL | 11 refills | Status: DC

## 2017-04-22 ENCOUNTER — Inpatient Hospital Stay (HOSPITAL_COMMUNITY)
Admission: AD | Admit: 2017-04-22 | Discharge: 2017-04-22 | Disposition: A | Discharge status: Home / Self Care | Payer: Medicaid Other | Source: Ambulatory Visit | Attending: Obstetrics & Gynecology | Admitting: Obstetrics & Gynecology

## 2017-04-22 ENCOUNTER — Inpatient Hospital Stay (HOSPITAL_COMMUNITY): Payer: Medicaid Other

## 2017-04-22 ENCOUNTER — Encounter (HOSPITAL_COMMUNITY): Payer: Self-pay

## 2017-04-22 DIAGNOSIS — E669 Obesity, unspecified: Secondary | ICD-10-CM | POA: Diagnosis not present

## 2017-04-22 DIAGNOSIS — Z833 Family history of diabetes mellitus: Secondary | ICD-10-CM | POA: Diagnosis not present

## 2017-04-22 DIAGNOSIS — O99211 Obesity complicating pregnancy, first trimester: Secondary | ICD-10-CM | POA: Insufficient documentation

## 2017-04-22 DIAGNOSIS — N8312 Corpus luteum cyst of left ovary: Secondary | ICD-10-CM | POA: Insufficient documentation

## 2017-04-22 DIAGNOSIS — Z7984 Long term (current) use of oral hypoglycemic drugs: Secondary | ICD-10-CM | POA: Insufficient documentation

## 2017-04-22 DIAGNOSIS — R109 Unspecified abdominal pain: Secondary | ICD-10-CM

## 2017-04-22 DIAGNOSIS — E119 Type 2 diabetes mellitus without complications: Secondary | ICD-10-CM | POA: Diagnosis not present

## 2017-04-22 DIAGNOSIS — O26899 Other specified pregnancy related conditions, unspecified trimester: Secondary | ICD-10-CM

## 2017-04-22 DIAGNOSIS — O3680X0 Pregnancy with inconclusive fetal viability, not applicable or unspecified: Secondary | ICD-10-CM | POA: Diagnosis not present

## 2017-04-22 DIAGNOSIS — Z3201 Encounter for pregnancy test, result positive: Secondary | ICD-10-CM | POA: Insufficient documentation

## 2017-04-22 DIAGNOSIS — O24111 Pre-existing diabetes mellitus, type 2, in pregnancy, first trimester: Secondary | ICD-10-CM | POA: Diagnosis not present

## 2017-04-22 DIAGNOSIS — E118 Type 2 diabetes mellitus with unspecified complications: Secondary | ICD-10-CM

## 2017-04-22 DIAGNOSIS — O99331 Smoking (tobacco) complicating pregnancy, first trimester: Secondary | ICD-10-CM | POA: Insufficient documentation

## 2017-04-22 DIAGNOSIS — Z825 Family history of asthma and other chronic lower respiratory diseases: Secondary | ICD-10-CM | POA: Diagnosis not present

## 2017-04-22 DIAGNOSIS — F17210 Nicotine dependence, cigarettes, uncomplicated: Secondary | ICD-10-CM | POA: Insufficient documentation

## 2017-04-22 DIAGNOSIS — Z3A01 Less than 8 weeks gestation of pregnancy: Secondary | ICD-10-CM | POA: Insufficient documentation

## 2017-04-22 DIAGNOSIS — O26891 Other specified pregnancy related conditions, first trimester: Secondary | ICD-10-CM

## 2017-04-22 DIAGNOSIS — Z811 Family history of alcohol abuse and dependence: Secondary | ICD-10-CM | POA: Diagnosis not present

## 2017-04-22 DIAGNOSIS — R102 Pelvic and perineal pain: Secondary | ICD-10-CM

## 2017-04-22 DIAGNOSIS — O3481 Maternal care for other abnormalities of pelvic organs, first trimester: Secondary | ICD-10-CM | POA: Insufficient documentation

## 2017-04-22 DIAGNOSIS — N898 Other specified noninflammatory disorders of vagina: Secondary | ICD-10-CM | POA: Diagnosis present

## 2017-04-22 LAB — COMPREHENSIVE METABOLIC PANEL
ALK PHOS: 63 U/L (ref 38–126)
ALT: 19 U/L (ref 14–54)
ANION GAP: 8 (ref 5–15)
AST: 19 U/L (ref 15–41)
Albumin: 3.7 g/dL (ref 3.5–5.0)
BUN: 13 mg/dL (ref 6–20)
CALCIUM: 9.1 mg/dL (ref 8.9–10.3)
CO2: 30 mmol/L (ref 22–32)
CREATININE: 0.45 mg/dL (ref 0.44–1.00)
Chloride: 97 mmol/L — ABNORMAL LOW (ref 101–111)
Glucose, Bld: 149 mg/dL — ABNORMAL HIGH (ref 65–99)
Potassium: 4.3 mmol/L (ref 3.5–5.1)
Sodium: 135 mmol/L (ref 135–145)
TOTAL PROTEIN: 7 g/dL (ref 6.5–8.1)
Total Bilirubin: 0.2 mg/dL — ABNORMAL LOW (ref 0.3–1.2)

## 2017-04-22 LAB — WET PREP, GENITAL
CLUE CELLS WET PREP: NONE SEEN
Sperm: NONE SEEN
TRICH WET PREP: NONE SEEN
YEAST WET PREP: NONE SEEN

## 2017-04-22 LAB — URINALYSIS, ROUTINE W REFLEX MICROSCOPIC
BILIRUBIN URINE: NEGATIVE
GLUCOSE, UA: 50 mg/dL — AB
Hgb urine dipstick: NEGATIVE
Ketones, ur: 5 mg/dL — AB
Leukocytes, UA: NEGATIVE
Nitrite: NEGATIVE
PH: 5 (ref 5.0–8.0)
Protein, ur: NEGATIVE mg/dL
Specific Gravity, Urine: 1.023 (ref 1.005–1.030)

## 2017-04-22 LAB — CBC
HEMATOCRIT: 41.7 % (ref 36.0–46.0)
Hemoglobin: 14.2 g/dL (ref 12.0–15.0)
MCH: 29.7 pg (ref 26.0–34.0)
MCHC: 34.1 g/dL (ref 30.0–36.0)
MCV: 87.2 fL (ref 78.0–100.0)
Platelets: 258 10*3/uL (ref 150–400)
RBC: 4.78 MIL/uL (ref 3.87–5.11)
RDW: 13.1 % (ref 11.5–15.5)
WBC: 11.5 10*3/uL — AB (ref 4.0–10.5)

## 2017-04-22 LAB — POCT PREGNANCY, URINE: Preg Test, Ur: POSITIVE — AB

## 2017-04-22 LAB — HCG, QUANTITATIVE, PREGNANCY: hCG, Beta Chain, Quant, S: 2956 m[IU]/mL — ABNORMAL HIGH (ref <5)

## 2017-04-22 MED ORDER — GLUCOSE BLOOD VI STRP: ORAL_STRIP | 12 refills | Status: DC

## 2017-04-22 MED ORDER — ACCU-CHEK FASTCLIX LANCETS MISC: 99 refills | Status: DC

## 2017-04-22 MED ORDER — BLOOD GLUCOSE MONITOR SYSTEM W/DEVICE KIT: PACK | 0 refills | Status: DC

## 2017-04-22 NOTE — Discharge Instructions (Signed)
Desired Blood Glucose Levels: Fasting (before eating in morning) - 90 or below 2 hours after each meal - 120 or below   Abdominal Pain During Pregnancy Abdominal pain is common in pregnancy. Most of the time, it does not cause harm. There are many causes of abdominal pain. Some causes are more serious than others and sometimes the cause is not known. Abdominal pain can be a sign that something is very wrong with the pregnancy or the pain may have nothing to do with the pregnancy. Always tell your health care provider if you have any abdominal pain. Follow these instructions at home:  Do not have sex or put anything in your vagina until your symptoms go away completely.  Watch your abdominal pain for any changes.  Get plenty of rest until your pain improves.  Drink enough fluid to keep your urine clear or pale yellow.  Take over-the-counter or prescription medicines only as told by your health care provider.  Keep all follow-up visits as told by your health care provider. This is important. Contact a health care provider if:  You have a fever.  Your pain gets worse or you have cramping.  Your pain continues after resting. Get help right away if:  You are bleeding, leaking fluid, or passing tissue from the vagina.  You have vomiting or diarrhea that does not go away.  You have painful or bloody urination.  You notice a decrease in your baby's movements.  You feel very weak or faint.  You have shortness of breath.  You develop a severe headache with abdominal pain.  You have abnormal vaginal discharge with abdominal pain. This information is not intended to replace advice given to you by your health care provider. Make sure you discuss any questions you have with your health care provider. Document Released: 07/24/2005 Document Revised: 05/04/2016 Document Reviewed: 02/20/2013 Elsevier Interactive Patient Education  [employer].

## 2017-04-22 NOTE — MAU Note (Signed)
Pt here with c/o abdominal pain. Has irregular periods and hasn't had a period for about 3 months.
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
## 2017-04-22 NOTE — Progress Notes (Addendum)
G3P2 @ ?Marland Kitchen Presents to triage for lower abdominal sharp pain that started 3-4 days ago. Denies bleeding.   Last intercourse was 4 days ago.  Last period was 3 month ago. Pt states always had irregular periods in which pt informed her OB provider [facility].   UPT +  2145: Wetprep and GC done  2152: U/S paged.   2156: Pt walked to U/S.   2202: relinquished care over to [facility].

## 2017-04-23 LAB — HIV ANTIBODY (ROUTINE TESTING W REFLEX): HIV SCREEN 4TH GENERATION: NONREACTIVE

## 2017-04-23 LAB — GC/CHLAMYDIA PROBE AMP (~~LOC~~) NOT AT ARMC
CHLAMYDIA, DNA PROBE: NEGATIVE
NEISSERIA GONORRHEA: NEGATIVE

## 2017-05-17 ENCOUNTER — Other Ambulatory Visit: Payer: Self-pay

## 2017-06-04 ENCOUNTER — Ambulatory Visit (INDEPENDENT_AMBULATORY_CARE_PROVIDER_SITE_OTHER): Payer: Medicaid Other | Admitting: Obstetrics and Gynecology

## 2017-06-04 ENCOUNTER — Encounter: Payer: Self-pay | Admitting: Obstetrics and Gynecology

## 2017-06-04 VITALS — BP 135/78 | HR 89 | Wt 251.5 lb

## 2017-06-04 DIAGNOSIS — E118 Type 2 diabetes mellitus with unspecified complications: Secondary | ICD-10-CM

## 2017-06-04 DIAGNOSIS — F1911 Other psychoactive substance abuse, in remission: Secondary | ICD-10-CM | POA: Insufficient documentation

## 2017-06-04 DIAGNOSIS — O0991 Supervision of high risk pregnancy, unspecified, first trimester: Secondary | ICD-10-CM

## 2017-06-04 DIAGNOSIS — O0990 Supervision of high risk pregnancy, unspecified, unspecified trimester: Secondary | ICD-10-CM | POA: Insufficient documentation

## 2017-06-04 DIAGNOSIS — Z98891 History of uterine scar from previous surgery: Secondary | ICD-10-CM

## 2017-06-04 LAB — POCT URINALYSIS DIP (DEVICE)
BILIRUBIN URINE: NEGATIVE
Glucose, UA: NEGATIVE mg/dL
HGB URINE DIPSTICK: NEGATIVE
Ketones, ur: 15 mg/dL — AB
LEUKOCYTES UA: NEGATIVE
NITRITE: NEGATIVE
Protein, ur: NEGATIVE mg/dL
Specific Gravity, Urine: 1.025 (ref 1.005–1.030)
Urobilinogen, UA: 2 mg/dL — ABNORMAL HIGH (ref 0.0–1.0)
pH: 7 (ref 5.0–8.0)

## 2017-06-04 MED ORDER — GLYBURIDE 2.5 MG PO TABS: 2.5000 mg | ORAL_TABLET | Freq: Two times a day (BID) | ORAL | 3 refills | Status: DC

## 2017-06-04 MED ORDER — GLUCOSE BLOOD VI STRP: ORAL_STRIP | 12 refills | Status: DC

## 2017-06-04 MED ORDER — ACCU-CHEK SOFT TOUCH LANCETS MISC: 12 refills | Status: DC

## 2017-06-04 NOTE — Progress Notes (Signed)
INITIAL PRENATAL VISIT NOTE  Subjective:  Krystal [name] is a 31 y.o. G3P2002 at [redacted]w[redacted]d by [place] being seen today for her initial prenatal visit. This is a planned pregnancy. She and partner are happy with the pregnancy. She was using IUD for birth control previously. She has an obstetric history significant for one SVD then one CS for h/o shoulder dystocia.  She has a medical history significant for T2DM on metformin and glyburide.  Patient reports headache and nausea.  Contractions: Not present. Vag. Bleeding: None.  Movement: Present. Denies leaking of fluid. Has been having slightly worse headaches than usual. Reports her sugars have been all over the place and she thinks she is not feeling well because of blood sugar  Past Medical History:  Diagnosis Date  . Left ACL tear 07/2015  . Medial meniscus tear 07/2015   left knee  . Non-insulin dependent type 2 diabetes mellitus (HCC)   . Obesity     Past Surgical History:  Procedure Laterality Date  . ANTERIOR CRUCIATE LIGAMENT REPAIR Left 07/23/2015   Procedure: RECONSTRUCTION ANTERIOR CRUCIATE LIGAMENT (ACL) WITH GRAFTLINK HAMSTRING GRAFT;  Surgeon: Sheral Apley, MD;  Location: [facility];  Service: Orthopedics;  Laterality: Left;  . CESAREAN SECTION N/A 08/18/2013   Procedure: CESAREAN SECTION;  Surgeon: Reva Bores, MD;  Location: [facility];  Service: Obstetrics;  Laterality: N/A;  . EXCISION BONE CYST     gum  . KNEE ARTHROSCOPY WITH MEDIAL MENISECTOMY Left 07/23/2015   Procedure: LEFT KNEE ARTHROSCOPY WITH PARTIAL MEDIAL MENISCECTOMY ;  Surgeon: Sheral Apley, MD;  Location: [facility];  Service: Orthopedics;  Laterality: Left;  . KNEE ARTHROSCOPY WITH MENISCAL REPAIR Left 07/23/2015   Procedure: LATERAL MENISCAL REPAIR;  Surgeon: Sheral Apley, MD;  Location: [facility];  Service: Orthopedics;  Laterality: Left;    OB History  Gravida Para Term Preterm AB Living  3 2 2      2   SAB TAB Ectopic Multiple Live Births          2    # Outcome Date GA Lbr Len/2nd Weight Sex Delivery Anes PTL Lv  3 Current           2 Term 08/18/13 [redacted]w[redacted]d  9 lb 8.7 oz (4.33 kg) M CS-LTranv   LIV  1 Term 07/11/10 [redacted]w[redacted]d  8 lb 13 oz (3.997 kg) M Vag-Spont EPI N LIV     Birth Comments: Shoulder Dystocia      Social History   Social History  . Marital status: Legally Separated    Spouse name: N/A  . Number of children: N/A  . Years of education: N/A   Social History Main Topics  . Smoking status: Current Every Day Smoker    Packs/day: 0.50    Years: 10.00    Types: Cigarettes  . Smokeless tobacco: Never Used  . Alcohol use No  . Drug use: No     Comment: Hx percocet use, on subutex  . Sexual activity: Yes    Birth control/ protection: None   Other Topics Concern  . None   Social History Narrative  . None    Family History  Problem Relation Age of Onset  . Asthma Mother   . Diabetes Mother   . Alcohol abuse Mother   . Stroke Mother     (Not in a hospital admission)  No Known Allergies  Review of Systems: Negative except for what is mentioned  in HPI.  Objective:   Vitals:   06/04/17 0933  BP: 135/78  Pulse: 89  Weight: 251 lb 8 oz (114.1 kg)    Fetal Status: Fetal Heart Rate (bpm): 162   Movement: Present     Physical Exam: BP 135/78   Pulse 89   Wt 251 lb 8 oz (114.1 kg)   LMP  (LMP Unknown) Comment: had period about 3 months ago  BMI 43.17 kg/m  CONSTITUTIONAL: Well-developed, well-nourished female in no acute distress.  NEUROLOGIC: Alert and oriented to person, place, and time. Normal reflexes, muscle tone coordination. No cranial nerve deficit noted. PSYCHIATRIC: Normal mood and affect. Normal behavior. Normal judgment and thought content. SKIN: Skin is warm and dry. No rash noted. Not diaphoretic. No erythema. No pallor. HENT:  Normocephalic, atraumatic, External right and left ear normal. Oropharynx is clear and moist EYES: Conjunctivae  and EOM are normal. Pupils are equal, round, and reactive to light. No scleral icterus.  NECK: Normal range of motion, supple, no masses CARDIOVASCULAR: Normal heart rate noted, regular rhythm RESPIRATORY: Effort and breath sounds normal, no problems with respiration noted BREASTS: symmetric, non-tender, no masses palpable ABDOMEN: Soft, nontender, nondistended GU: normal appearing external female genitalia, multiparous normal appearing cervix, scant white discharge in vagina, no lesions noted Bimanual: difficult to palpate size per habitus, no adnexal tenderness or palpable lesions noted MUSCULOSKELETAL: Normal range of motion. EXT:  No edema and no tenderness. 2+ distal pulses.   Assessment and Plan:  Pregnancy: G3P2002 at 7550w3d by early US with GS  1. Supervision of high risk pregnancy, antepartum Had US with GS, no embryo Confirmatory viability testing ordered Reviewed options for genetic testing, patient wants to think about it and discuss with husband - Cystic fibrosis gene test - Obstetric Panel, Including HIV - Flu Vaccine QUAD 36+ mos IM - Enroll Patient in Babyscripts Pap negative 05/2015  2. Type 2 diabetes mellitus with complication, without long-term current use of insulin (HCC) On metofrmin 1000 mg BID and glyburide 2.5 mg BID, has not been checking sugars bc she is out of strips but reports sugars are generally well controlled on this dose Resent strips, patient will start checking - needs optho - patient will make appt - needs fetal echo - glucose blood (ACCU-CHEK ACTIVE STRIPS) test strip; Use as instructed  Dispense: 100 each; Refill: 12 - Lancets (ACCU-CHEK SOFT TOUCH) lancets; Use as instructed  Dispense: 100 each; Refill: 12  3. H/O: substance abuse On subutex 8 mg BID UDS today  4. H/o c-section Wants to TOLAC   For viability US, scheduled for 06/12/17  Preterm labor symptoms and general obstetric precautions including but not limited to vaginal  bleeding, contractions, leaking of fluid and fetal movement were reviewed in detail with the patient.  Please refer to After Visit Summary for other counseling recommendations.   Return for OB visit (MD).  Conan BowensKelly M Kyandre Okray 06/04/2017 12:43 PM

## 2017-06-04 NOTE — Patient Instructions (Addendum)
Start taking baby Aspirin (aspirin 81 mg daily)   Second Trimester of Pregnancy The second trimester is from week 14 through week 27 (months 4 through 6). The second trimester is often a time when you feel your best. Your body has adjusted to being pregnant, and you begin to feel better physically. Usually, morning sickness has lessened or quit completely, you may have more energy, and you may have an increase in appetite. The second trimester is also a time when the fetus is growing rapidly. At the end of the sixth month, the fetus is about 9 inches long and weighs about 1 pounds. You will likely begin to feel the baby move (quickening) between 16 and 20 weeks of pregnancy. Body changes during your second trimester Your body continues to go through many changes during your second trimester. The changes vary from woman to woman.  Your weight will continue to increase. You will notice your lower abdomen bulging out.  You may begin to get stretch marks on your hips, abdomen, and breasts.  You may develop headaches that can be relieved by medicines. The medicines should be approved by your health care provider.  You may urinate more often because the fetus is pressing on your bladder.  You may develop or continue to have heartburn as a result of your pregnancy.  You may develop constipation because certain hormones are causing the muscles that push waste through your intestines to slow down.  You may develop hemorrhoids or swollen, bulging veins (varicose veins).  You may have back pain. This is caused by: ? Weight gain. ? Pregnancy hormones that are relaxing the joints in your pelvis. ? A shift in weight and the muscles that support your balance.  Your breasts will continue to grow and they will continue to become tender.  Your gums may bleed and may be sensitive to brushing and flossing.  Dark spots or blotches (chloasma, mask of pregnancy) may develop on your face. This will likely  fade after the baby is born.  A dark line from your belly button to the pubic area (linea nigra) may appear. This will likely fade after the baby is born.  You may have changes in your hair. These can include thickening of your hair, rapid growth, and changes in texture. Some women also have hair loss during or after pregnancy, or hair that feels dry or thin. Your hair will most likely return to normal after your baby is born.  What to expect at prenatal visits During a routine prenatal visit:  You will be weighed to make sure you and the fetus are growing normally.  Your blood pressure will be taken.  Your abdomen will be measured to track your baby's growth.  The fetal heartbeat will be listened to.  Any test results from the previous visit will be discussed.  Your health care provider may ask you:  How you are feeling.  If you are feeling the baby move.  If you have had any abnormal symptoms, such as leaking fluid, bleeding, severe headaches, or abdominal cramping.  If you are using any tobacco products, including cigarettes, chewing tobacco, and electronic cigarettes.  If you have any questions.  Other tests that may be performed during your second trimester include:  Blood tests that check for: ? Low iron levels (anemia). ? High blood sugar that affects pregnant women (gestational diabetes) between 36 and 28 weeks. ? Rh antibodies. This is to check for a protein on red blood cells (Rh  factor).  Urine tests to check for infections, diabetes, or protein in the urine.  An ultrasound to confirm the proper growth and development of the baby.  An amniocentesis to check for possible genetic problems.  Fetal screens for spina bifida and Down syndrome.  HIV (human immunodeficiency virus) testing. Routine prenatal testing includes screening for HIV, unless you choose not to have this test.  Follow these instructions at home: Medicines  Follow your health care provider's  instructions regarding medicine use. Specific medicines may be either safe or unsafe to take during pregnancy.  Take a prenatal vitamin that contains at least 600 micrograms (mcg) of folic acid.  If you develop constipation, try taking a stool softener if your health care provider approves. Eating and drinking  Eat a balanced diet that includes fresh fruits and vegetables, whole grains, good sources of protein such as meat, eggs, or tofu, and low-fat dairy. Your health care provider will help you determine the amount of weight gain that is right for you.  Avoid raw meat and uncooked cheese. These carry germs that can cause birth defects in the baby.  If you have low calcium intake from food, talk to your health care provider about whether you should take a daily calcium supplement.  Limit foods that are high in fat and processed sugars, such as fried and sweet foods.  To prevent constipation: ? Drink enough fluid to keep your urine clear or pale yellow. ? Eat foods that are high in fiber, such as fresh fruits and vegetables, whole grains, and beans. Activity  Exercise only as directed by your health care provider. Most women can continue their usual exercise routine during pregnancy. Try to exercise for 30 minutes at least 5 days a week. Stop exercising if you experience uterine contractions.  Avoid heavy lifting, wear low heel shoes, and practice good posture.  A sexual relationship may be continued unless your health care provider directs you otherwise. Relieving pain and discomfort  Wear a good support bra to prevent discomfort from breast tenderness.  Take warm sitz baths to soothe any pain or discomfort caused by hemorrhoids. Use hemorrhoid cream if your health care provider approves.  Rest with your legs elevated if you have leg cramps or low back pain.  If you develop varicose veins, wear support hose. Elevate your feet for 15 minutes, 3-4 times a day. Limit salt in your  diet. Prenatal Care  Write down your questions. Take them to your prenatal visits.  Keep all your prenatal visits as told by your health care provider. This is important. Safety  Wear your seat belt at all times when driving.  Make a list of emergency phone numbers, including numbers for family, friends, the hospital, and police and fire departments. General instructions  Ask your health care provider for a referral to a local prenatal education class. Begin classes no later than the beginning of month 6 of your pregnancy.  Ask for help if you have counseling or nutritional needs during pregnancy. Your health care provider can offer advice or refer you to specialists for help with various needs.  Do not use hot tubs, steam rooms, or saunas.  Do not douche or use tampons or scented sanitary pads.  Do not cross your legs for long periods of time.  Avoid cat litter boxes and soil used by cats. These carry germs that can cause birth defects in the baby and possibly loss of the fetus by miscarriage or stillbirth.  Avoid all smoking,  herbs, alcohol, and unprescribed drugs. Chemicals in these products can affect the formation and growth of the baby.  Do not use any products that contain nicotine or tobacco, such as cigarettes and e-cigarettes. If you need help quitting, ask your health care provider.  Visit your dentist if you have not gone yet during your pregnancy. Use a soft toothbrush to brush your teeth and be gentle when you floss. Contact a health care provider if:  You have dizziness.  You have mild pelvic cramps, pelvic pressure, or nagging pain in the abdominal area.  You have persistent nausea, vomiting, or diarrhea.  You have a bad smelling vaginal discharge.  You have pain when you urinate. Get help right away if:  You have a fever.  You are leaking fluid from your vagina.  You have spotting or bleeding from your vagina.  You have severe abdominal cramping or  pain.  You have rapid weight gain or weight loss.  You have shortness of breath with chest pain.  You notice sudden or extreme swelling of your face, hands, ankles, feet, or legs.  You have not felt your baby move in over an hour.  You have severe headaches that do not go away when you take medicine.  You have vision changes. Summary  The second trimester is from week 14 through week 27 (months 4 through 6). It is also a time when the fetus is growing rapidly.  Your body goes through many changes during pregnancy. The changes vary from woman to woman.  Avoid all smoking, herbs, alcohol, and unprescribed drugs. These chemicals affect the formation and growth your baby.  Do not use any tobacco products, such as cigarettes, chewing tobacco, and e-cigarettes. If you need help quitting, ask your health care provider.  Contact your health care provider if you have any questions. Keep all prenatal visits as told by your health care provider. This is important. This information is not intended to replace advice given to you by your health care provider. Make sure you discuss any questions you have with your health care provider. Document Released: 07/18/2001 Document Revised: 12/30/2015 Document Reviewed: 09/24/2012 Elsevier Interactive Patient Education  2017 [employer]

## 2017-06-04 NOTE — Progress Notes (Signed)
Here for initial prenatal visit. States has not been taken  Glucoses because got meter, but got wrong strips.   PCM form completed

## 2017-06-05 ENCOUNTER — Encounter: Payer: Self-pay | Admitting: Obstetrics and Gynecology

## 2017-06-05 DIAGNOSIS — Z283 Underimmunization status: Secondary | ICD-10-CM | POA: Insufficient documentation

## 2017-06-05 DIAGNOSIS — O09899 Supervision of other high risk pregnancies, unspecified trimester: Secondary | ICD-10-CM | POA: Insufficient documentation

## 2017-06-05 DIAGNOSIS — O9989 Other specified diseases and conditions complicating pregnancy, childbirth and the puerperium: Secondary | ICD-10-CM

## 2017-06-05 LAB — OBSTETRIC PANEL, INCLUDING HIV
Antibody Screen: NEGATIVE
BASOS ABS: 0 10*3/uL (ref 0.0–0.2)
Basos: 0 %
EOS (ABSOLUTE): 0.2 10*3/uL (ref 0.0–0.4)
Eos: 2 %
HEMATOCRIT: 38.9 % (ref 34.0–46.6)
HEMOGLOBIN: 13.4 g/dL (ref 11.1–15.9)
HEP B S AG: NEGATIVE
HIV SCREEN 4TH GENERATION: NONREACTIVE
IMMATURE GRANULOCYTES: 0 %
Immature Grans (Abs): 0 10*3/uL (ref 0.0–0.1)
Lymphocytes Absolute: 2.2 10*3/uL (ref 0.7–3.1)
Lymphs: 25 %
MCH: 29.3 pg (ref 26.6–33.0)
MCHC: 34.4 g/dL (ref 31.5–35.7)
MCV: 85 fL (ref 79–97)
MONOCYTES: 5 %
Monocytes Absolute: 0.4 10*3/uL (ref 0.1–0.9)
NEUTROS ABS: 5.9 10*3/uL (ref 1.4–7.0)
Neutrophils: 68 %
PLATELETS: 274 10*3/uL (ref 150–379)
RBC: 4.57 x10E6/uL (ref 3.77–5.28)
RDW: 13.3 % (ref 12.3–15.4)
RPR: NONREACTIVE
Rh Factor: POSITIVE
WBC: 8.7 10*3/uL (ref 3.4–10.8)

## 2017-06-09 LAB — TOXASSURE SELECT 13 (MW), URINE

## 2017-06-11 LAB — CYSTIC FIBROSIS GENE TEST

## 2017-06-12 ENCOUNTER — Ambulatory Visit (HOSPITAL_COMMUNITY)
Admission: RE | Admit: 2017-06-12 | Discharge: 2017-06-12 | Disposition: A | Discharge status: Home / Self Care | Payer: Medicaid Other | Source: Ambulatory Visit | Attending: Obstetrics and Gynecology | Admitting: Obstetrics and Gynecology

## 2017-06-12 DIAGNOSIS — Z3A12 12 weeks gestation of pregnancy: Secondary | ICD-10-CM | POA: Insufficient documentation

## 2017-06-12 DIAGNOSIS — O0990 Supervision of high risk pregnancy, unspecified, unspecified trimester: Secondary | ICD-10-CM | POA: Insufficient documentation

## 2017-06-14 ENCOUNTER — Other Ambulatory Visit: Payer: Self-pay

## 2017-06-18 ENCOUNTER — Encounter: Payer: Self-pay | Admitting: Obstetrics and Gynecology

## 2017-07-10 ENCOUNTER — Encounter: Payer: Self-pay | Admitting: Nurse Practitioner

## 2017-07-10 ENCOUNTER — Ambulatory Visit (INDEPENDENT_AMBULATORY_CARE_PROVIDER_SITE_OTHER): Payer: Medicaid Other | Admitting: Nurse Practitioner

## 2017-07-10 VITALS — BP 119/64 | HR 96 | Wt 254.0 lb

## 2017-07-10 DIAGNOSIS — O0992 Supervision of high risk pregnancy, unspecified, second trimester: Secondary | ICD-10-CM

## 2017-07-10 DIAGNOSIS — F1911 Other psychoactive substance abuse, in remission: Secondary | ICD-10-CM

## 2017-07-10 DIAGNOSIS — Z98891 History of uterine scar from previous surgery: Secondary | ICD-10-CM

## 2017-07-10 DIAGNOSIS — F17200 Nicotine dependence, unspecified, uncomplicated: Secondary | ICD-10-CM

## 2017-07-10 DIAGNOSIS — Z6841 Body Mass Index (BMI) 40.0 and over, adult: Secondary | ICD-10-CM

## 2017-07-10 DIAGNOSIS — E118 Type 2 diabetes mellitus with unspecified complications: Secondary | ICD-10-CM

## 2017-07-10 DIAGNOSIS — L299 Pruritus, unspecified: Secondary | ICD-10-CM

## 2017-07-10 LAB — POCT URINALYSIS DIP (DEVICE)
BILIRUBIN URINE: NEGATIVE
GLUCOSE, UA: NEGATIVE mg/dL
Hgb urine dipstick: NEGATIVE
Ketones, ur: NEGATIVE mg/dL
LEUKOCYTES UA: NEGATIVE
NITRITE: NEGATIVE
PH: 7 (ref 5.0–8.0)
Protein, ur: NEGATIVE mg/dL
Specific Gravity, Urine: 1.015 (ref 1.005–1.030)
UROBILINOGEN UA: 0.2 mg/dL (ref 0.0–1.0)

## 2017-07-10 MED ORDER — PRENATAL 27-1 MG PO TABS
1.0000 | ORAL_TABLET | Freq: Every day | ORAL | 11 refills | Status: DC
Start: 2017-07-10 — End: 2019-05-07

## 2017-07-10 NOTE — Progress Notes (Signed)
Subjective:  Krystal [name] is a 31 y.o. G3P2002 at 3563w4d being seen today for ongoing prenatal care.  She is currently monitored for the following issues for this high-risk pregnancy and has Diabetes mellitus (HCC); OBESITY, NOS; TOBACCO DEPENDENCE; DEPRESSION, MILD; MIGRAINE, UNSPEC., W/O INTRACTABLE MIGRAINE; ECZEMA, ATOPIC; HIDRADENITIS SUPPURATIVA; GERD (gastroesophageal reflux disease); S/P C-section; Encounter for insertion of mirena IUD; Alopecia; Acne; Bilateral knee pain; Healthcare maintenance; Candidiasis of vulva and vagina; Supervision of high risk pregnancy, antepartum; H/O: substance abuse; and Rubella non-immune status, antepartum on their problem list.  Patient reports itching.   . Vag. Bleeding: None.  Movement: Present. Denies leaking of fluid.   The following portions of the patient's history were reviewed and updated as appropriate: allergies, current medications, past family history, past medical history, past social history, past surgical history and problem list. Problem list updated.  Objective:   Vitals:   07/10/17 1105  BP: 119/64  Pulse: 96  Weight: 254 lb (115.2 kg)    Fetal Status: Fetal Heart Rate (bpm): 140 Fundal Height: 15 cm Movement: Present     General:  Alert, oriented and cooperative. Patient is in no acute distress.  Skin: Skin is warm and dry. No rash noted.   Cardiovascular: Normal heart rate noted  Respiratory: Normal respiratory effort, no problems with respiration noted  Abdomen: Soft, gravid, appropriate for gestational age. Pain/Pressure: Present     Pelvic:  Cervical exam deferred        Extremities: Normal range of motion.  Edema: Mild pitting, slight indentation  Mental Status: Normal mood and affect. Normal behavior. Normal judgment and thought content.     Assessment and Plan:  Pregnancy: G3P2002 at 2163w4d  1. Supervision of high risk pregnancy, antepartum, second trimester Normal pregnancy aches and pains - working 40+  hours per week and many times at night.  Almost asleep at visit today but worked last night and is moving also.  Has nausea and vomits once daily.  Declines medication for nausea.  Prenatal vitamin prescription sent to her pharmacy today. - US MFM OB DETAIL +14 WK; Future  2. Type 2 diabetes mellitus with complication, without long-term current use of insulin (HCC) Reports taking Gliburide and Metformin daily as prescribed.  Is not currently checking blood sugars - had a problem at the pharmacy getting the appropriate lancets but she thinks that is worked out now and will need to pick up the lancets.  States she cannot make the diabetes educator appointment and requested her to change the appointment on the way out today.  Advised to begin checking the blood sugar and recording it and to bring the diary with her to the educator visit and to each of her OB visits.  Advised to pick up and start chewable baby ASA daily.  She has not started this yet.  3. H/O: substance abuse Continuing Subutex BID and is currently being seen at [facility] on [address]    4. S/P C-section  5. TOBACCO DEPENDENCE Advised to stop smoking - currently is 1/2 PPD  6. Morbid obesity with BMI of 40.0-44.9, adult (HCC)   7. Itching Reports itching all over her body.  No significant rash - has eczema but no patches seen.  Client did not get blood drawn today - left before blood draw.  Consider at next visit but will try Aveeno lotion to her skin to see if the itching will stop.   - Bile acids, total - Hepatic function  panel  Preterm labor symptoms and general obstetric precautions including but not limited to vaginal bleeding, contractions, leaking of fluid and fetal movement were reviewed in detail with the patient. Please refer to After Visit Summary for other counseling recommendations.  Return in about 2 weeks (around 07/24/2017).  Nolene BernheimERRI Atthew Coutant, RN, MSN, NP-BC Nurse Practitioner, [facility]  for [facility], [facility] 07/10/2017 11:56 AM

## 2017-07-10 NOTE — Patient Instructions (Addendum)
Get a chewable baby aspirin and chew one up daily.    Morning Sickness Morning sickness is when you feel sick to your stomach (nauseous) during pregnancy. You may feel sick to your stomach and throw up (vomit). You may feel sick in the morning, but you can feel this way any time of day. Some women feel very sick to their stomach and cannot stop throwing up (hyperemesis gravidarum). Follow these instructions at home:  Only take medicines as told by your doctor.  Take multivitamins as told by your doctor. Taking multivitamins before getting pregnant can stop or lessen the harshness of morning sickness.  Eat dry toast or unsalted crackers before getting out of bed.  Eat 5 to 6 small meals a day.  Eat dry and bland foods like rice and baked potatoes.  Do not drink liquids with meals. Drink between meals.  Do not eat greasy, fatty, or spicy foods.  Have someone cook for you if the smell of food causes you to feel sick or throw up.  If you feel sick to your stomach after taking prenatal vitamins, take them at night or with a snack.  Eat protein when you need a snack (nuts, yogurt, cheese).  Eat unsweetened gelatins for dessert.  Wear a bracelet used for sea sickness (acupressure wristband).  Go to a doctor that puts thin needles into certain body points (acupuncture) to improve how you feel.  Do not smoke.  Use a humidifier to keep the air in your house free of odors.  Get lots of fresh air. Contact a doctor if:  You need medicine to feel better.  You feel dizzy or lightheaded.  You are losing weight. Get help right away if:  You feel very sick to your stomach and cannot stop throwing up.  You pass out (faint). This information is not intended to replace advice given to you by your health care provider. Make sure you discuss any questions you have with your health care provider. Document Released: 08/31/2004 Document Revised: 12/30/2015 Document Reviewed:  01/08/2013 Elsevier Interactive Patient Education  2017 [employer]

## 2017-07-10 NOTE — Addendum Note (Signed)
Addended by: Garret ReddishBARNES, Amanda Steuart M on: 07/10/2017 04:09 PM   Modules accepted: Orders

## 2017-07-12 ENCOUNTER — Other Ambulatory Visit: Payer: Self-pay

## 2017-07-19 ENCOUNTER — Encounter (HOSPITAL_COMMUNITY): Payer: Self-pay | Admitting: Nurse Practitioner

## 2017-07-25 ENCOUNTER — Encounter (HOSPITAL_COMMUNITY): Payer: Self-pay

## 2017-07-27 ENCOUNTER — Ambulatory Visit (INDEPENDENT_AMBULATORY_CARE_PROVIDER_SITE_OTHER): Payer: Medicaid Other | Admitting: Obstetrics and Gynecology

## 2017-07-27 ENCOUNTER — Encounter (HOSPITAL_COMMUNITY): Payer: Self-pay

## 2017-07-27 ENCOUNTER — Encounter: Payer: Self-pay | Admitting: Obstetrics and Gynecology

## 2017-07-27 ENCOUNTER — Ambulatory Visit (HOSPITAL_COMMUNITY)
Admission: RE | Admit: 2017-07-27 | Discharge: 2017-07-27 | Disposition: A | Discharge status: Home / Self Care | Payer: Medicaid Other | Source: Ambulatory Visit | Attending: Nurse Practitioner | Admitting: Nurse Practitioner

## 2017-07-27 VITALS — BP 116/67 | HR 100 | Wt 253.2 lb

## 2017-07-27 DIAGNOSIS — O09292 Supervision of pregnancy with other poor reproductive or obstetric history, second trimester: Secondary | ICD-10-CM | POA: Diagnosis not present

## 2017-07-27 DIAGNOSIS — Z23 Encounter for immunization: Secondary | ICD-10-CM | POA: Diagnosis not present

## 2017-07-27 DIAGNOSIS — O34219 Maternal care for unspecified type scar from previous cesarean delivery: Secondary | ICD-10-CM | POA: Diagnosis not present

## 2017-07-27 DIAGNOSIS — Z87898 Personal history of other specified conditions: Secondary | ICD-10-CM

## 2017-07-27 DIAGNOSIS — F1911 Other psychoactive substance abuse, in remission: Secondary | ICD-10-CM

## 2017-07-27 DIAGNOSIS — Z3A19 19 weeks gestation of pregnancy: Secondary | ICD-10-CM | POA: Diagnosis not present

## 2017-07-27 DIAGNOSIS — O321XX0 Maternal care for breech presentation, not applicable or unspecified: Secondary | ICD-10-CM | POA: Diagnosis not present

## 2017-07-27 DIAGNOSIS — O0992 Supervision of high risk pregnancy, unspecified, second trimester: Secondary | ICD-10-CM | POA: Diagnosis not present

## 2017-07-27 DIAGNOSIS — O99332 Smoking (tobacco) complicating pregnancy, second trimester: Secondary | ICD-10-CM | POA: Insufficient documentation

## 2017-07-27 DIAGNOSIS — Z283 Underimmunization status: Secondary | ICD-10-CM

## 2017-07-27 DIAGNOSIS — O99322 Drug use complicating pregnancy, second trimester: Secondary | ICD-10-CM | POA: Insufficient documentation

## 2017-07-27 DIAGNOSIS — O24112 Pre-existing diabetes mellitus, type 2, in pregnancy, second trimester: Secondary | ICD-10-CM | POA: Insufficient documentation

## 2017-07-27 DIAGNOSIS — O9989 Other specified diseases and conditions complicating pregnancy, childbirth and the puerperium: Secondary | ICD-10-CM

## 2017-07-27 DIAGNOSIS — O99212 Obesity complicating pregnancy, second trimester: Secondary | ICD-10-CM | POA: Insufficient documentation

## 2017-07-27 DIAGNOSIS — O0990 Supervision of high risk pregnancy, unspecified, unspecified trimester: Secondary | ICD-10-CM

## 2017-07-27 DIAGNOSIS — E118 Type 2 diabetes mellitus with unspecified complications: Secondary | ICD-10-CM

## 2017-07-27 DIAGNOSIS — Z2839 Other underimmunization status: Secondary | ICD-10-CM

## 2017-07-27 DIAGNOSIS — F1120 Opioid dependence, uncomplicated: Secondary | ICD-10-CM | POA: Diagnosis not present

## 2017-07-27 DIAGNOSIS — F17200 Nicotine dependence, unspecified, uncomplicated: Secondary | ICD-10-CM

## 2017-07-27 DIAGNOSIS — Z3689 Encounter for other specified antenatal screening: Secondary | ICD-10-CM | POA: Insufficient documentation

## 2017-07-27 DIAGNOSIS — Z98891 History of uterine scar from previous surgery: Secondary | ICD-10-CM

## 2017-07-27 DIAGNOSIS — Z7984 Long term (current) use of oral hypoglycemic drugs: Secondary | ICD-10-CM | POA: Insufficient documentation

## 2017-07-27 DIAGNOSIS — O09899 Supervision of other high risk pregnancies, unspecified trimester: Secondary | ICD-10-CM

## 2017-07-27 HISTORY — DX: Other psychoactive substance abuse, uncomplicated: F19.10

## 2017-07-27 NOTE — Addendum Note (Signed)
Addended by: Henrietta DineNEAL, Jacia Sickman S on: 07/27/2017 12:41 PM   Modules accepted: Orders

## 2017-07-27 NOTE — Progress Notes (Signed)
   PRENATAL VISIT NOTE  Subjective:  Krystal [name] is a 31 y.o. G3P2002 at 46w0dbeing seen today for ongoing prenatal care.  She is currently monitored for the following issues for this high-risk pregnancy and [place]; OBESITY, NOS; TOBACCO DEPENDENCE; DEPRESSION, MILD; MIGRAINE, UNSPEC., W/O INTRACTABLE MIGRAINE; ECZEMA, ATOPIC; HIDRADENITIS SUPPURATIVA; GERD (gastroesophageal reflux disease); S/P C-section; Encounter for insertion of mirena IUD; Alopecia; Acne; Bilateral knee pain; Healthcare maintenance; Candidiasis of vulva and vagina; Supervision of high risk pregnancy, antepartum; H/O: substance abuse; and Rubella non-immune status, antepartum on their problem list.  Patient reports bilateral lower leg swelling and itching behind left knee.  Contractions: Not present. Vag. Bleeding: None.  Movement: Present. Denies leaking of fluid.   T2DM on metofrmin 1000 mg BID, glyburide 2.5 mg BID  FG: 80-90 PP: 120-180, more 120s, only 1x 182  The following portions of the patient's history were reviewed and updated as appropriate: allergies, current medications, past family history, past medical history, past social history, past surgical history and problem list. Problem list updated.  Objective:   Vitals:   07/27/17 1104  BP: 116/67  Pulse: 100  Weight: 253 lb 3.2 oz (114.9 kg)    Fetal Status: Fetal Heart Rate (bpm): 134   Movement: Present     General:  Alert, oriented and cooperative. Patient is in no acute distress.  Skin: Skin is warm and dry. No rash noted.   Cardiovascular: Normal heart rate noted  Respiratory: Normal respiratory effort, no problems with respiration noted  Abdomen: Soft, gravid, appropriate for gestational age.  Pain/Pressure: Present     Pelvic: Cervical exam deferred        Extremities: Normal range of motion.  Edema: None, calves +1 edema bilaterally, both measuring 48.5 cm circumference, no erythema, redness, +2 pedal pulses  bilaterally, dry skin behind both knees, no evidence of infection or other anomalies  Mental Status:  Normal mood and affect. Normal behavior. Normal judgment and thought content.   Assessment and Plan:  Pregnancy: G3P2002 at 124w0d1. Supervision of high risk pregnancy, antepartum - Flu Vaccine QUAD 36+ mos IM (Fluarix, Quad PF) - AFP TETRA - anatomy today  2. Type 2 diabetes mellitus with complication, without long-term current use of insulin (HCC) On metformin 1000 mg BID and glyburide 2.5 mg BID FG well controlled, PP fairly well controlled per patient but she did not bring logs, encouraged her to bring logs Given optho referral Fetal echo scheduled today  3. H/O: substance abuse On subutex 8 mg/day Sees Dr. [facility]  4. Rubella non-immune status, antepartum MMR pp  5. S/P C-section Possibly wants TOLAC  6. TOBACCO DEPENDENCE Smoking 1/2 PPD, down from 1 PPD Encouraged to continue to cut down  7. Swelling - bilateral symmetric swelling No evidence of DVT Encouraged patient to rest legs and call with any issues   Preterm labor symptoms and general obstetric precautions including but not limited to vaginal bleeding, contractions, leaking of fluid and fetal movement were reviewed in detail with the patient. Please refer to After Visit Summary for other counseling recommendations.  No Follow-up on file.   KeSloan LeiterMD

## 2017-07-27 NOTE — Progress Notes (Addendum)
Appt[facility]for Fetal Echo scheduled at [facility] @ [phone] made on 08/14/17@ 12:45p.  [facility] make referral appt for Ophthalmology, pt.notified.

## 2017-07-30 ENCOUNTER — Other Ambulatory Visit (HOSPITAL_COMMUNITY): Payer: Self-pay | Admitting: *Deleted

## 2017-07-30 DIAGNOSIS — Z0489 Encounter for examination and observation for other specified reasons: Secondary | ICD-10-CM

## 2017-07-30 DIAGNOSIS — IMO0002 Reserved for concepts with insufficient information to code with codable children: Secondary | ICD-10-CM

## 2017-08-02 LAB — AFP TETRA
DIA Mom Value: 1.39
DIA VALUE (EIA): 186.32 pg/mL
DSR (By Age)    1 IN: 732
DSR (Second Trimester) 1 IN: 6051
Gestational Age: 19 WEEKS
MSAFP Mom: 0.91
MSAFP: 32.2 ng/mL
MSHCG MOM: 0.52
MSHCG: 9284 m[IU]/mL
Maternal Age At EDD: 29.6 yr
Osb Risk: 10000
TEST RESULTS AFP: NEGATIVE
UE3 MOM: 0.7
Weight: 253 [lb_av]
uE3 Value: 0.97 ng/mL

## 2017-08-07 NOTE — L&D Delivery Note (Signed)
Delivery Note Pt was admitted for IOL due to T2DM and was induced with cervical foley, Pit, and AROM. She became complete at approx 0225, pushed well, and at 2:47 AM a viable female was delivered via Vaginal, Spontaneous VBAC (Presentation: ROA).  APGAR:5 ,9 ; weight: 3476gm. Infant dried and placed on pt's abd; cord clamped and cut by FOB after 53mn; hospital cord blood sample collected. Placenta status: spont ,intact .  Cord: 3 vessel  Anesthesia:  Epidural Episiotomy:  None Lacerations:  None Est. Blood Loss (mL):  250  Mom to postpartum.  Baby to Couplet care / Skin to Skin.  Pt has decided for interval BTL. MMR ordered for PP.  SSerita GrammesCNM 12/15/2017, 3:05 AM  Please schedule this patient for Postpartum visit in: 2 weeks with the following provider: MD For C/S patients schedule nurse incision check in weeks 2 weeks: no High risk pregnancy complicated by: T2 DM, TOLAC Delivery mode:  VBAC Anticipated Birth Control:  Plans Interval BTL to be done before 6wks PP PP Procedures needed: none  Schedule Integrated BH visit: no

## 2017-08-09 ENCOUNTER — Encounter: Payer: Medicaid Other | Attending: Obstetrics and Gynecology | Admitting: *Deleted

## 2017-08-09 ENCOUNTER — Ambulatory Visit: Payer: Medicaid Other | Admitting: *Deleted

## 2017-08-09 ENCOUNTER — Other Ambulatory Visit: Payer: Self-pay

## 2017-08-09 DIAGNOSIS — Z713 Dietary counseling and surveillance: Secondary | ICD-10-CM | POA: Diagnosis present

## 2017-08-09 DIAGNOSIS — Z7984 Long term (current) use of oral hypoglycemic drugs: Principal | ICD-10-CM

## 2017-08-09 DIAGNOSIS — O24119 Pre-existing diabetes mellitus, type 2, in pregnancy, unspecified trimester: Secondary | ICD-10-CM

## 2017-08-09 DIAGNOSIS — E118 Type 2 diabetes mellitus with unspecified complications: Secondary | ICD-10-CM

## 2017-08-09 NOTE — Progress Notes (Signed)
  Patient was seen on 08/09/2017 for type 2 Diabetes with pregnancy self-management. Patient states she was diagnosed with type 2 diabetes when she was about 32 years old. She states she has an Accu Chek Meter with both lancing devices. She eats a small breakfast, usually skips lunch and has a fairly well rounded supper meal. Beverages are typically water, diet soda and occassional sips of fruit punch. She states she is taking 2.5 mg Glyburide twice a day at breakfast and supper, and 1000 mg Metformin twice a day. The following learning objectives were met by the patient :   States the definition of type 2 Diabetes with pregnancy  States why dietary management is important in controlling blood glucose  Describes the effects of carbohydrates on blood glucose levels  Demonstrates ability to create a balanced meal plan  Demonstrates carbohydrate counting   States when to check blood glucose levels  Demonstrates proper blood glucose monitoring techniques  States the effect of stress and exercise on blood glucose levels  States the importance of limiting caffeine and abstaining from alcohol and smoking  Plan:  Aim for 3 Carb Choices per meal (45 grams) +/- 1 either way  Aim for 1-2 Carbs per snack Begin reading food labels for Total Carbohydrate of foods Consider  increasing your activity level by walking or other activity daily as tolerated Begin checking BG before breakfast and 2 hours after first bite of breakfast, lunch and dinner as directed by MD  Bring Log Book to every medical appointment   Take medication if directed by MD  Patient already has a meter: Accu Chek Active per pharmacy records And is testing pre breakfast and 2 hours after each meal as directed by MD Verbal report of BG per patient: FBG running 80-90 mg/dl and most post meal BG under 120 with occasional excursions.   Patient instructed to monitor glucose levels: FBS: 60 - 95 mg/dl 2 hour: <120 mg/dl  Patient  received the following handouts:  Nutrition Diabetes and Pregnancy  Carbohydrate Counting List  Patient will be seen for follow-up in one month.

## 2017-08-10 MED ORDER — ACCU-CHEK SOFT TOUCH LANCETS MISC: 12 refills | Status: DC

## 2017-08-10 MED ORDER — GLUCOSE BLOOD VI STRP: ORAL_STRIP | 12 refills | Status: DC

## 2017-08-13 ENCOUNTER — Encounter: Payer: Self-pay | Admitting: Obstetrics and Gynecology

## 2017-08-21 ENCOUNTER — Other Ambulatory Visit: Payer: Self-pay

## 2017-08-24 ENCOUNTER — Encounter (HOSPITAL_COMMUNITY): Payer: Self-pay

## 2017-08-24 ENCOUNTER — Ambulatory Visit (HOSPITAL_COMMUNITY)
Admission: RE | Admit: 2017-08-24 | Discharge: 2017-08-24 | Disposition: A | Discharge status: Home / Self Care | Payer: Medicaid Other | Source: Ambulatory Visit | Attending: Obstetrics and Gynecology | Admitting: Obstetrics and Gynecology

## 2017-08-24 ENCOUNTER — Other Ambulatory Visit (HOSPITAL_COMMUNITY): Payer: Self-pay | Admitting: *Deleted

## 2017-08-24 ENCOUNTER — Other Ambulatory Visit (HOSPITAL_COMMUNITY): Payer: Self-pay | Admitting: Maternal and Fetal Medicine

## 2017-08-24 DIAGNOSIS — F1120 Opioid dependence, uncomplicated: Secondary | ICD-10-CM

## 2017-08-24 DIAGNOSIS — O34219 Maternal care for unspecified type scar from previous cesarean delivery: Secondary | ICD-10-CM

## 2017-08-24 DIAGNOSIS — Z362 Encounter for other antenatal screening follow-up: Secondary | ICD-10-CM

## 2017-08-24 DIAGNOSIS — O99332 Smoking (tobacco) complicating pregnancy, second trimester: Secondary | ICD-10-CM

## 2017-08-24 DIAGNOSIS — O24119 Pre-existing diabetes mellitus, type 2, in pregnancy, unspecified trimester: Secondary | ICD-10-CM

## 2017-08-24 DIAGNOSIS — O24112 Pre-existing diabetes mellitus, type 2, in pregnancy, second trimester: Secondary | ICD-10-CM | POA: Diagnosis present

## 2017-08-24 DIAGNOSIS — O99320 Drug use complicating pregnancy, unspecified trimester: Secondary | ICD-10-CM

## 2017-08-24 DIAGNOSIS — O99212 Obesity complicating pregnancy, second trimester: Secondary | ICD-10-CM

## 2017-08-24 DIAGNOSIS — Z3A23 23 weeks gestation of pregnancy: Secondary | ICD-10-CM

## 2017-08-24 DIAGNOSIS — IMO0002 Reserved for concepts with insufficient information to code with codable children: Secondary | ICD-10-CM

## 2017-08-24 DIAGNOSIS — O09299 Supervision of pregnancy with other poor reproductive or obstetric history, unspecified trimester: Secondary | ICD-10-CM

## 2017-08-24 DIAGNOSIS — Z0489 Encounter for examination and observation for other specified reasons: Secondary | ICD-10-CM

## 2017-08-27 ENCOUNTER — Encounter: Payer: Self-pay | Admitting: Family Medicine

## 2017-09-10 ENCOUNTER — Ambulatory Visit (INDEPENDENT_AMBULATORY_CARE_PROVIDER_SITE_OTHER): Payer: Medicaid Other | Admitting: Family Medicine

## 2017-09-10 VITALS — BP 120/66 | HR 90 | Wt 255.0 lb

## 2017-09-10 DIAGNOSIS — Z23 Encounter for immunization: Secondary | ICD-10-CM | POA: Diagnosis not present

## 2017-09-10 DIAGNOSIS — Z98891 History of uterine scar from previous surgery: Secondary | ICD-10-CM

## 2017-09-10 DIAGNOSIS — O0992 Supervision of high risk pregnancy, unspecified, second trimester: Secondary | ICD-10-CM | POA: Diagnosis not present

## 2017-09-10 DIAGNOSIS — Z6841 Body Mass Index (BMI) 40.0 and over, adult: Secondary | ICD-10-CM

## 2017-09-10 DIAGNOSIS — E118 Type 2 diabetes mellitus with unspecified complications: Secondary | ICD-10-CM | POA: Diagnosis not present

## 2017-09-10 DIAGNOSIS — O0990 Supervision of high risk pregnancy, unspecified, unspecified trimester: Secondary | ICD-10-CM

## 2017-09-10 MED ORDER — CLONIDINE HCL 0.1 MG PO TABS: 0.1000 mg | ORAL_TABLET | Freq: Three times a day (TID) | ORAL | 2 refills | Status: DC | PRN

## 2017-09-10 NOTE — Progress Notes (Signed)
Subjective:  Krystal [name] is a 32 y.o. G3P2002 at 8262w3d being seen today for ongoing prenatal care.  She is currently monitored for the following issues for this high-risk pregnancy and has Diabetes mellitus (HCC); OBESITY, NOS; TOBACCO DEPENDENCE; DEPRESSION, MILD; MIGRAINE, UNSPEC., W/O INTRACTABLE MIGRAINE; ECZEMA, ATOPIC; HIDRADENITIS SUPPURATIVA; GERD (gastroesophageal reflux disease); S/P C-section; Encounter for insertion of mirena IUD; Alopecia; Acne; Bilateral knee pain; Healthcare maintenance; Candidiasis of vulva and vagina; Supervision of high risk pregnancy, antepartum; H/O: substance abuse; and Rubella non-immune status, antepartum on their problem list.  GDM: Patient taking metformin 1000mg  BID, glyburide 2.5mg  BID.  Reports no hypoglycemic episodes.  Tolerating medication well Fasting: 50-70 2hr PP: typically <120; occasionally high (above 200) when overeating  Patient reports fatigue.  Contractions: Not present. Vag. Bleeding: None.  Movement: Present. Denies leaking of fluid.   Patient's substance abuse program ([facility]) recently shut down.  She was prescribed 2-3 weeks of buprenorphin to bridge between finding another program; however, she has been unable to find a program and her prescription ran out 2 days ago.  She reports feeling anxious, having diarrhea and being unable to sleep over the past couple of days.    The following portions of the patient's history were reviewed and updated as appropriate: allergies, current medications, past family history, past medical history, past social history, past surgical history and problem list. Problem list updated.  Objective:   Vitals:   09/10/17 1317  BP: 120/66  Pulse: 90  Weight: 255 lb (115.7 kg)    Fetal Status: Fetal Heart Rate (bpm): 138   Movement: Present     General:  Alert, oriented and cooperative. Patient is in no acute distress.  Skin: Skin is warm and dry. No rash noted.   Cardiovascular: Normal heart  rate and rhythm noted, no murmurs, rubs or gallops appreciated  Respiratory: Normal respiratory effort, no problems with respiration noted  Abdomen: Soft, gravid, appropriate for gestational age. Pain/Pressure: Absent     Pelvic: Cervical exam deferred        Extremities: Normal range of motion.     Mental Status: Sad mood and affect. Normal behavior. Normal judgment and thought content.   Urinalysis:      Assessment and Plan:  Pregnancy: G3P2002 at 3262w3d  1. Supervision of high risk pregnancy, antepartum - CBC - RPR - HIV antibody (with reflex)  2. Type 2 diabetes mellitus with complication, without long-term current use of insulin (HCC) - Continue metformin 1000mg  BID, glyburide 2.5mg  BID - Fasting glucose well controlled - 2 hr PP controlled for the most part, but counseled patient on maintaining healthy diet and trying to keep PP below 120  3. H/o substance abuse - Patient given list of substance abuse programs in the area - Clonidine 0.1mg  TID prn prescribed for anxiety - Imodium use d/w patient for diarrhea   4. S/P C-section - Patient reiterates preference for TOLAC   5. Morbid obesity with BMI of 40.0-44.9, adult [facility](HCC) - Patient counseled on maintaining healthy diet  6. Tobacco use disorder - Patient continues to smoke 1/2 PPD - Encouraged cessation progress and continuance of cessation  Preterm labor symptoms and general obstetric precautions including but not limited to vaginal bleeding, contractions, leaking of fluid and fetal movement were reviewed in detail with the patient. Please refer to After Visit Summary for other counseling recommendations.  No Follow-up on file.   Hina Gupta, Laurena SlimmerBenjamin A, Medical Student

## 2017-09-11 ENCOUNTER — Encounter: Payer: Self-pay | Admitting: *Deleted

## 2017-09-11 LAB — CBC
Hematocrit: 39 % (ref 34.0–46.6)
Hemoglobin: 13.2 g/dL (ref 11.1–15.9)
MCH: 30.1 pg (ref 26.6–33.0)
MCHC: 33.8 g/dL (ref 31.5–35.7)
MCV: 89 fL (ref 79–97)
PLATELETS: 241 10*3/uL (ref 150–379)
RBC: 4.39 x10E6/uL (ref 3.77–5.28)
RDW: 12.8 % (ref 12.3–15.4)
WBC: 9.1 10*3/uL (ref 3.4–10.8)

## 2017-09-11 LAB — RPR: RPR Ser Ql: NONREACTIVE

## 2017-09-11 LAB — HIV ANTIBODY (ROUTINE TESTING W REFLEX): HIV Screen 4th Generation wRfx: NONREACTIVE

## 2017-09-21 ENCOUNTER — Encounter (HOSPITAL_COMMUNITY): Payer: Self-pay

## 2017-09-21 ENCOUNTER — Ambulatory Visit (HOSPITAL_COMMUNITY)
Admission: RE | Admit: 2017-09-21 | Discharge: 2017-09-21 | Disposition: A | Discharge status: Home / Self Care | Payer: Medicaid Other | Source: Ambulatory Visit | Attending: Obstetrics and Gynecology | Admitting: Obstetrics and Gynecology

## 2017-09-21 ENCOUNTER — Other Ambulatory Visit (HOSPITAL_COMMUNITY): Payer: Self-pay | Admitting: Obstetrics and Gynecology

## 2017-09-21 DIAGNOSIS — O34219 Maternal care for unspecified type scar from previous cesarean delivery: Secondary | ICD-10-CM

## 2017-09-21 DIAGNOSIS — O99212 Obesity complicating pregnancy, second trimester: Secondary | ICD-10-CM

## 2017-09-21 DIAGNOSIS — O99322 Drug use complicating pregnancy, second trimester: Secondary | ICD-10-CM | POA: Diagnosis not present

## 2017-09-21 DIAGNOSIS — O09292 Supervision of pregnancy with other poor reproductive or obstetric history, second trimester: Secondary | ICD-10-CM

## 2017-09-21 DIAGNOSIS — O24112 Pre-existing diabetes mellitus, type 2, in pregnancy, second trimester: Secondary | ICD-10-CM | POA: Diagnosis not present

## 2017-09-21 DIAGNOSIS — O99332 Smoking (tobacco) complicating pregnancy, second trimester: Secondary | ICD-10-CM | POA: Diagnosis not present

## 2017-09-21 DIAGNOSIS — Z3A27 27 weeks gestation of pregnancy: Secondary | ICD-10-CM | POA: Diagnosis not present

## 2017-09-21 DIAGNOSIS — F1120 Opioid dependence, uncomplicated: Secondary | ICD-10-CM | POA: Insufficient documentation

## 2017-09-21 DIAGNOSIS — O99320 Drug use complicating pregnancy, unspecified trimester: Secondary | ICD-10-CM

## 2017-09-21 DIAGNOSIS — O24119 Pre-existing diabetes mellitus, type 2, in pregnancy, unspecified trimester: Secondary | ICD-10-CM

## 2017-09-26 ENCOUNTER — Encounter: Payer: Self-pay | Admitting: Obstetrics and Gynecology

## 2017-10-01 ENCOUNTER — Encounter: Payer: Self-pay | Admitting: Family Medicine

## 2017-10-15 ENCOUNTER — Encounter: Payer: Self-pay | Admitting: Family Medicine

## 2017-10-19 ENCOUNTER — Ambulatory Visit (INDEPENDENT_AMBULATORY_CARE_PROVIDER_SITE_OTHER): Payer: Medicaid Other | Admitting: Obstetrics and Gynecology

## 2017-10-19 ENCOUNTER — Encounter (HOSPITAL_COMMUNITY): Payer: Self-pay

## 2017-10-19 ENCOUNTER — Other Ambulatory Visit (HOSPITAL_COMMUNITY): Payer: Self-pay | Admitting: Obstetrics and Gynecology

## 2017-10-19 ENCOUNTER — Ambulatory Visit (HOSPITAL_COMMUNITY)
Admission: RE | Admit: 2017-10-19 | Discharge: 2017-10-19 | Disposition: A | Discharge status: Home / Self Care | Payer: Medicaid Other | Source: Ambulatory Visit | Attending: Obstetrics and Gynecology | Admitting: Obstetrics and Gynecology

## 2017-10-19 ENCOUNTER — Encounter: Payer: Self-pay | Admitting: Obstetrics and Gynecology

## 2017-10-19 VITALS — BP 116/72 | HR 93 | Wt 264.1 lb

## 2017-10-19 DIAGNOSIS — O24119 Pre-existing diabetes mellitus, type 2, in pregnancy, unspecified trimester: Secondary | ICD-10-CM | POA: Diagnosis present

## 2017-10-19 DIAGNOSIS — Z3A31 31 weeks gestation of pregnancy: Secondary | ICD-10-CM | POA: Diagnosis not present

## 2017-10-19 DIAGNOSIS — O24113 Pre-existing diabetes mellitus, type 2, in pregnancy, third trimester: Secondary | ICD-10-CM

## 2017-10-19 DIAGNOSIS — O09293 Supervision of pregnancy with other poor reproductive or obstetric history, third trimester: Secondary | ICD-10-CM | POA: Diagnosis not present

## 2017-10-19 DIAGNOSIS — O09899 Supervision of other high risk pregnancies, unspecified trimester: Secondary | ICD-10-CM

## 2017-10-19 DIAGNOSIS — O9989 Other specified diseases and conditions complicating pregnancy, childbirth and the puerperium: Secondary | ICD-10-CM

## 2017-10-19 DIAGNOSIS — O99333 Smoking (tobacco) complicating pregnancy, third trimester: Secondary | ICD-10-CM | POA: Insufficient documentation

## 2017-10-19 DIAGNOSIS — K219 Gastro-esophageal reflux disease without esophagitis: Secondary | ICD-10-CM

## 2017-10-19 DIAGNOSIS — Z79891 Long term (current) use of opiate analgesic: Secondary | ICD-10-CM | POA: Insufficient documentation

## 2017-10-19 DIAGNOSIS — O24919 Unspecified diabetes mellitus in pregnancy, unspecified trimester: Secondary | ICD-10-CM | POA: Insufficient documentation

## 2017-10-19 DIAGNOSIS — Z98891 History of uterine scar from previous surgery: Secondary | ICD-10-CM

## 2017-10-19 DIAGNOSIS — F1911 Other psychoactive substance abuse, in remission: Secondary | ICD-10-CM

## 2017-10-19 DIAGNOSIS — O99323 Drug use complicating pregnancy, third trimester: Secondary | ICD-10-CM | POA: Insufficient documentation

## 2017-10-19 DIAGNOSIS — Z283 Underimmunization status: Secondary | ICD-10-CM

## 2017-10-19 DIAGNOSIS — O99213 Obesity complicating pregnancy, third trimester: Secondary | ICD-10-CM | POA: Diagnosis not present

## 2017-10-19 DIAGNOSIS — O34219 Maternal care for unspecified type scar from previous cesarean delivery: Secondary | ICD-10-CM | POA: Diagnosis not present

## 2017-10-19 DIAGNOSIS — O0990 Supervision of high risk pregnancy, unspecified, unspecified trimester: Secondary | ICD-10-CM

## 2017-10-19 DIAGNOSIS — Z87898 Personal history of other specified conditions: Secondary | ICD-10-CM

## 2017-10-19 MED ORDER — PANTOPRAZOLE SODIUM 20 MG PO TBEC: 20.0000 mg | DELAYED_RELEASE_TABLET | Freq: Every day | ORAL | 2 refills | Status: DC

## 2017-10-19 MED ORDER — METFORMIN HCL 1000 MG PO TABS: 1000.0000 mg | ORAL_TABLET | Freq: Two times a day (BID) | ORAL | 3 refills | Status: DC

## 2017-10-19 NOTE — Progress Notes (Signed)
Subjective:  Krystal [name] is a 32 y.o. G3P2002 at 5144w0d being seen today for ongoing prenatal care.  She is currently monitored for the following issues for this high-risk pregnancy and has Diabetes mellitus (HCC); OBESITY, NOS; TOBACCO DEPENDENCE; DEPRESSION, MILD; MIGRAINE, UNSPEC., W/O INTRACTABLE MIGRAINE; ECZEMA, ATOPIC; HIDRADENITIS SUPPURATIVA; GERD (gastroesophageal reflux disease); S/P C-section; Alopecia; Acne; Bilateral knee pain; Healthcare maintenance; Supervision of high risk pregnancy, antepartum; H/O: substance abuse; Rubella non-immune status, antepartum; and Diabetes in pregnancy on their problem list.  Patient reports heartburn.  Contractions: Not present. Vag. Bleeding: None.  Movement: Present. Denies leaking of fluid.   The following portions of the patient's history were reviewed and updated as appropriate: allergies, current medications, past family history, past medical history, past social history, past surgical history and problem list. Problem list updated.  Objective:   Vitals:   10/19/17 0927  BP: 116/72  Pulse: 93  Weight: 264 lb 1.6 oz (119.8 kg)    Fetal Status: Fetal Heart Rate (bpm): 131   Movement: Present     General:  Alert, oriented and cooperative. Patient is in no acute distress.  Skin: Skin is warm and dry. No rash noted.   Cardiovascular: Normal heart rate noted  Respiratory: Normal respiratory effort, no problems with respiration noted  Abdomen: Soft, gravid, appropriate for gestational age. Pain/Pressure: Absent     Pelvic:  Cervical exam deferred        Extremities: Normal range of motion.  Edema: Mild pitting, slight indentation  Mental Status: Normal mood and affect. Normal behavior. Normal judgment and thought content.   Urinalysis:      Assessment and Plan:  Pregnancy: G3P2002 at 4844w0d  1. Supervision of high risk pregnancy, antepartum Stable  2. S/P C-section TOLAC consent signed today  3. Gastroesophageal reflux disease  without esophagitis  - pantoprazole (PROTONIX) 20 MG tablet; Take 1 tablet (20 mg total) by mouth daily.  Dispense: 30 tablet; Refill: 2  4. H/O: substance abuse Followed by [facility]  NICU tour scheduled  5. Rubella non-immune status, antepartum Vaccine PP  6. Pre-existing type 2 diabetes mellitus during pregnancy in third trimester CBG's with goal range per pt Forgot CBG's Continue with current treatment Growth scan today Start weekly antenatal testing with next visit  Preterm labor symptoms and general obstetric precautions including but not limited to vaginal bleeding, contractions, leaking of fluid and fetal movement were reviewed in detail with the patient. Please refer to After Visit Summary for other counseling recommendations.  Return in about 1 week (around 10/26/2017) for OB visit.   Hermina StaggersErvin, Kelby Lotspeich L, MD

## 2017-10-19 NOTE — Patient Instructions (Signed)
Third Trimester of Pregnancy The third trimester is from week 28 through week 40 (months 7 through 9). The third trimester is a time when the unborn baby (fetus) is growing rapidly. At the end of the ninth month, the fetus is about 20 inches in length and weighs 6-10 pounds. Body changes during your third trimester Your body will continue to go through many changes during pregnancy. The changes vary from woman to woman. During the third trimester:  Your weight will continue to increase. You can expect to gain 25-35 pounds (11-16 kg) by the end of the pregnancy.  You may begin to get stretch marks on your hips, abdomen, and breasts.  You may urinate more often because the fetus is moving lower into your pelvis and pressing on your bladder.  You may develop or continue to have heartburn. This is caused by increased hormones that slow down muscles in the digestive tract.  You may develop or continue to have constipation because increased hormones slow digestion and cause the muscles that push waste through your intestines to relax.  You may develop hemorrhoids. These are swollen veins (varicose veins) in the rectum that can itch or be painful.  You may develop swollen, bulging veins (varicose veins) in your legs.  You may have increased body aches in the pelvis, back, or thighs. This is due to weight gain and increased hormones that are relaxing your joints.  You may have changes in your hair. These can include thickening of your hair, rapid growth, and changes in texture. Some women also have hair loss during or after pregnancy, or hair that feels dry or thin. Your hair will most likely return to normal after your baby is born.  Your breasts will continue to grow and they will continue to become tender. A yellow fluid (colostrum) may leak from your breasts. This is the first milk you are producing for your baby.  Your belly button may stick out.  You may notice more swelling in your hands,  face, or ankles.  You may have increased tingling or numbness in your hands, arms, and legs. The skin on your belly may also feel numb.  You may feel short of breath because of your expanding uterus.  You may have more problems sleeping. This can be caused by the size of your belly, increased need to urinate, and an increase in your body's metabolism.  You may notice the fetus "dropping," or moving lower in your abdomen (lightening).  You may have increased vaginal discharge.  You may notice your joints feel loose and you may have pain around your pelvic bone.  What to expect at prenatal visits You will have prenatal exams every 2 weeks until week 36. Then you will have weekly prenatal exams. During a routine prenatal visit:  You will be weighed to make sure you and the baby are growing normally.  Your blood pressure will be taken.  Your abdomen will be measured to track your baby's growth.  The fetal heartbeat will be listened to.  Any test results from the previous visit will be discussed.  You may have a cervical check near your due date to see if your cervix has softened or thinned (effaced).  You will be tested for Group B streptococcus. This happens between 35 and 37 weeks.  Your health care provider may ask you:  What your birth plan is.  How you are feeling.  If you are feeling the baby move.  If you have had   any abnormal symptoms, such as leaking fluid, bleeding, severe headaches, or abdominal cramping.  If you are using any tobacco products, including cigarettes, chewing tobacco, and electronic cigarettes.  If you have any questions.  Other tests or screenings that may be performed during your third trimester include:  Blood tests that check for low iron levels (anemia).  Fetal testing to check the health, activity level, and growth of the fetus. Testing is done if you have certain medical conditions or if there are problems during the  pregnancy.  Nonstress test (NST). This test checks the health of your baby to make sure there are no signs of problems, such as the baby not getting enough oxygen. During this test, a belt is placed around your belly. The baby is made to move, and its heart rate is monitored during movement.  What is false labor? False labor is a condition in which you feel small, irregular tightenings of the muscles in the womb (contractions) that usually go away with rest, changing position, or drinking water. These are called Braxton Hicks contractions. Contractions may last for hours, days, or even weeks before true labor sets in. If contractions come at regular intervals, become more frequent, increase in intensity, or become painful, you should see your health care provider. What are the signs of labor?  Abdominal cramps.  Regular contractions that start at 10 minutes apart and become stronger and more frequent with time.  Contractions that start on the top of the uterus and spread down to the lower abdomen and back.  Increased pelvic pressure and dull back pain.  A watery or bloody mucus discharge that comes from the vagina.  Leaking of amniotic fluid. This is also known as your "water breaking." It could be a slow trickle or a gush. Let your health care provider know if it has a color or strange odor. If you have any of these signs, call your health care provider right away, even if it is before your due date. Follow these instructions at home: Medicines  Follow your health care provider's instructions regarding medicine use. Specific medicines may be either safe or unsafe to take during pregnancy.  Take a prenatal vitamin that contains at least 600 micrograms (mcg) of folic acid.  If you develop constipation, try taking a stool softener if your health care provider approves. Eating and drinking  Eat a balanced diet that includes fresh fruits and vegetables, whole grains, good sources of protein  such as meat, eggs, or tofu, and low-fat dairy. Your health care provider will help you determine the amount of weight gain that is right for you.  Avoid raw meat and uncooked cheese. These carry germs that can cause birth defects in the baby.  If you have low calcium intake from food, talk to your health care provider about whether you should take a daily calcium supplement.  Eat four or five small meals rather than three large meals a day.  Limit foods that are high in fat and processed sugars, such as fried and sweet foods.  To prevent constipation: ? Drink enough fluid to keep your urine clear or pale yellow. ? Eat foods that are high in fiber, such as fresh fruits and vegetables, whole grains, and beans. Activity  Exercise only as directed by your health care provider. Most women can continue their usual exercise routine during pregnancy. Try to exercise for 30 minutes at least 5 days a week. Stop exercising if you experience uterine contractions.  Avoid heavy   lifting.  Do not exercise in extreme heat or humidity, or at high altitudes.  Wear low-heel, comfortable shoes.  Practice good posture.  You may continue to have sex unless your health care provider tells you otherwise. Relieving pain and discomfort  Take frequent breaks and rest with your legs elevated if you have leg cramps or low back pain.  Take warm sitz baths to soothe any pain or discomfort caused by hemorrhoids. Use hemorrhoid cream if your health care provider approves.  Wear a good support bra to prevent discomfort from breast tenderness.  If you develop varicose veins: ? Wear support pantyhose or compression stockings as told by your healthcare provider. ? Elevate your feet for 15 minutes, 3-4 times a day. Prenatal care  Write down your questions. Take them to your prenatal visits.  Keep all your prenatal visits as told by your health care provider. This is important. Safety  Wear your seat belt at  all times when driving.  Make a list of emergency phone numbers, including numbers for family, friends, the hospital, and police and fire departments. General instructions  Avoid cat litter boxes and soil used by cats. These carry germs that can cause birth defects in the baby. If you have a cat, ask someone to clean the litter box for you.  Do not travel far distances unless it is absolutely necessary and only with the approval of your health care provider.  Do not use hot tubs, steam rooms, or saunas.  Do not drink alcohol.  Do not use any products that contain nicotine or tobacco, such as cigarettes and e-cigarettes. If you need help quitting, ask your health care provider.  Do not use any medicinal herbs or unprescribed drugs. These chemicals affect the formation and growth of the baby.  Do not douche or use tampons or scented sanitary pads.  Do not cross your legs for long periods of time.  To prepare for the arrival of your baby: ? Take prenatal classes to understand, practice, and ask questions about labor and delivery. ? Make a trial run to the hospital. ? Visit the hospital and tour the maternity area. ? Arrange for maternity or paternity leave through employers. ? Arrange for family and friends to take care of pets while you are in the hospital. ? Purchase a rear-facing car seat and make sure you know how to install it in your car. ? Pack your hospital bag. ? Prepare the baby's nursery. Make sure to remove all pillows and stuffed animals from the baby's crib to prevent suffocation.  Visit your dentist if you have not gone during your pregnancy. Use a soft toothbrush to brush your teeth and be gentle when you floss. Contact a health care provider if:  You are unsure if you are in labor or if your water has broken.  You become dizzy.  You have mild pelvic cramps, pelvic pressure, or nagging pain in your abdominal area.  You have lower back pain.  You have persistent  nausea, vomiting, or diarrhea.  You have an unusual or bad smelling vaginal discharge.  You have pain when you urinate. Get help right away if:  Your water breaks before 37 weeks.  You have regular contractions less than 5 minutes apart before 37 weeks.  You have a fever.  You are leaking fluid from your vagina.  You have spotting or bleeding from your vagina.  You have severe abdominal pain or cramping.  You have rapid weight loss or weight gain.    You have shortness of breath with chest pain.  You notice sudden or extreme swelling of your face, hands, ankles, feet, or legs.  Your baby makes fewer than 10 movements in 2 hours.  You have severe headaches that do not go away when you take medicine.  You have vision changes. Summary  The third trimester is from week 28 through week 40, months 7 through 9. The third trimester is a time when the unborn baby (fetus) is growing rapidly.  During the third trimester, your discomfort may increase as you and your baby continue to gain weight. You may have abdominal, leg, and back pain, sleeping problems, and an increased need to urinate.  During the third trimester your breasts will keep growing and they will continue to become tender. A yellow fluid (colostrum) may leak from your breasts. This is the first milk you are producing for your baby.  False labor is a condition in which you feel small, irregular tightenings of the muscles in the womb (contractions) that eventually go away. These are called Braxton Hicks contractions. Contractions may last for hours, days, or even weeks before true labor sets in.  Signs of labor can include: abdominal cramps; regular contractions that start at 10 minutes apart and become stronger and more frequent with time; watery or bloody mucus discharge that comes from the vagina; increased pelvic pressure and dull back pain; and leaking of amniotic fluid. This information is not intended to replace advice  given to you by your health care provider. Make sure you discuss any questions you have with your health care provider. Document Released: 07/18/2001 Document Revised: 12/30/2015 Document Reviewed: 09/24/2012 Elsevier Interactive Patient Education  2017 [employer]  

## 2017-10-23 ENCOUNTER — Encounter: Payer: Self-pay | Admitting: *Deleted

## 2017-10-26 ENCOUNTER — Encounter: Payer: Self-pay | Admitting: Obstetrics & Gynecology

## 2017-10-26 IMAGING — MR MR KNEE*L* W/O CM
4 of 6 series · 15 of 40 positions shown · non-contrast
Comparison: Radiographs 05/31/2015.

CLINICAL DATA: 29-year-old with chronic knee pain since twisting
injury years ago. No previous relevant surgery. Initial encounter.

EXAM:
MRI OF THE LEFT KNEE WITHOUT CONTRAST
TECHNIQUE: Multiplanar, multisequence MR imaging of the knee was performed. No
intravenous contrast was administered.

[Series 3: PD · axial · 4.0mm · 0.27mm/px · z∈[-54,+37]mm · 6 of 24 slices shown (1 of 2)]
[im 1/24]
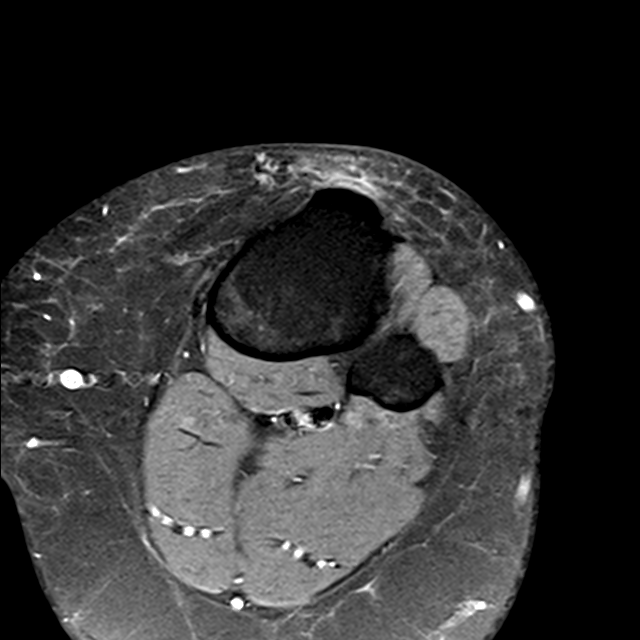
[im 4/24]
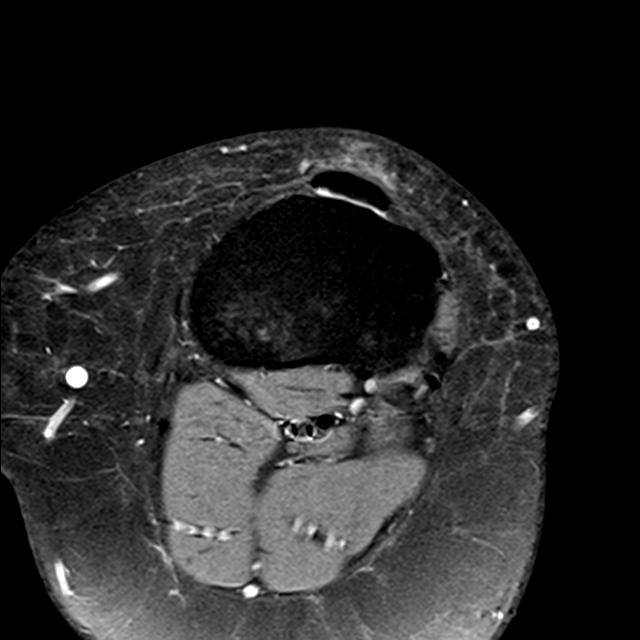
[im 7/24]
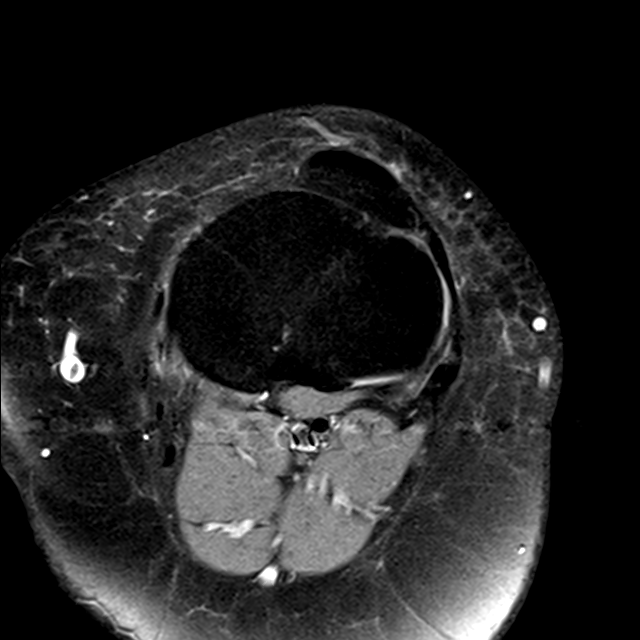
[im 10/24]
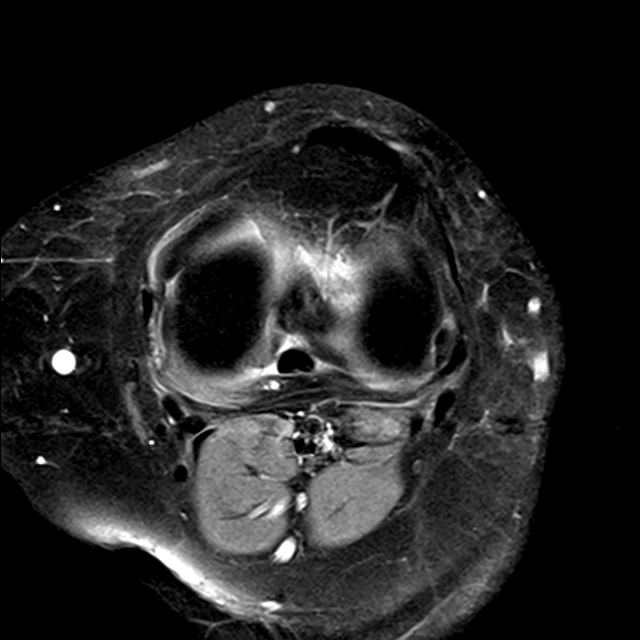
[im 14/24]
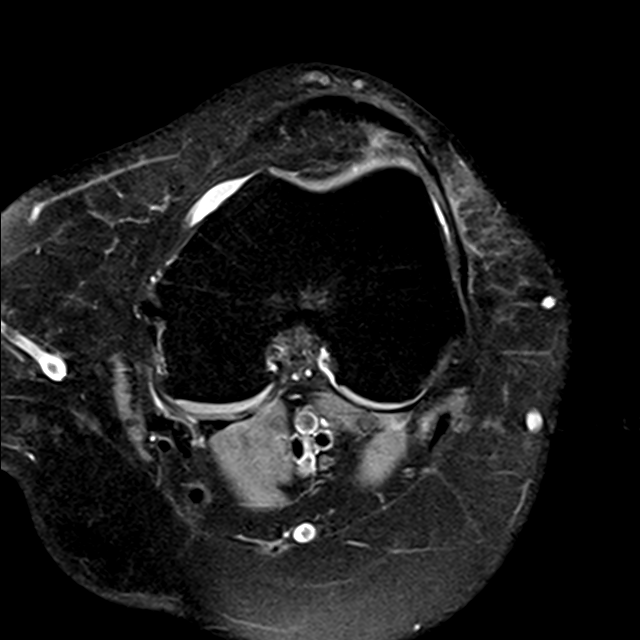
[im 20/24]
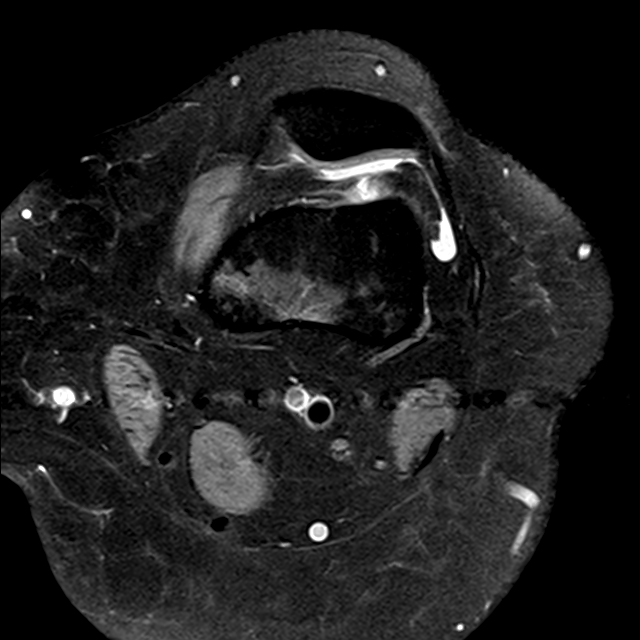

[Series 4: PD · coronal · 4.0mm · 0.31mm/px · 3 of 19 slices shown (2 of 2)]
[im 4/19]
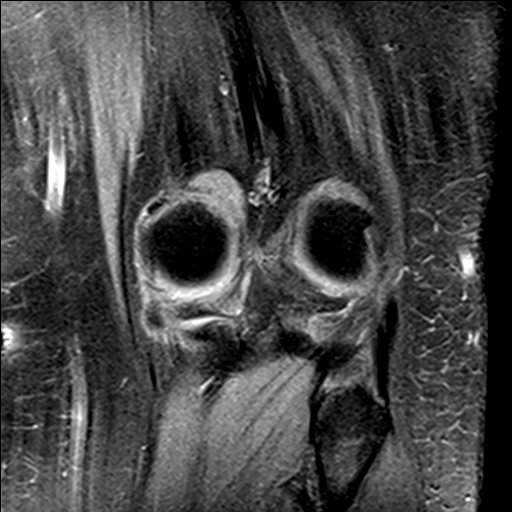
[im 10/19]
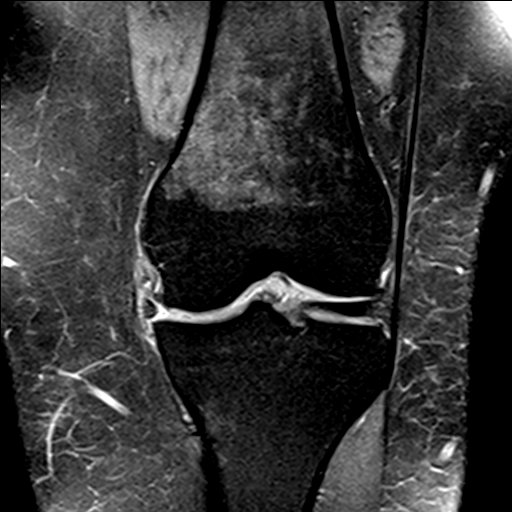
[im 16/19]
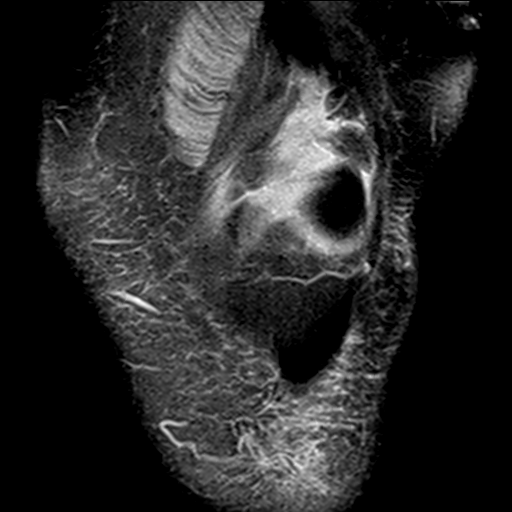

[Series 5: T2 · coronal · 4.0mm · 0.31mm/px · 3 of 19 slices shown]
[im 4/19]
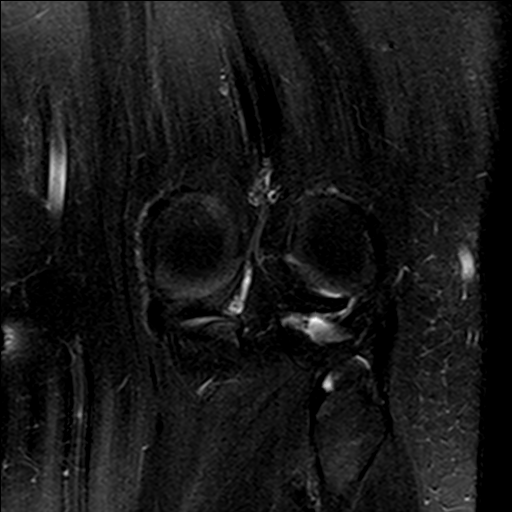
[im 10/19]
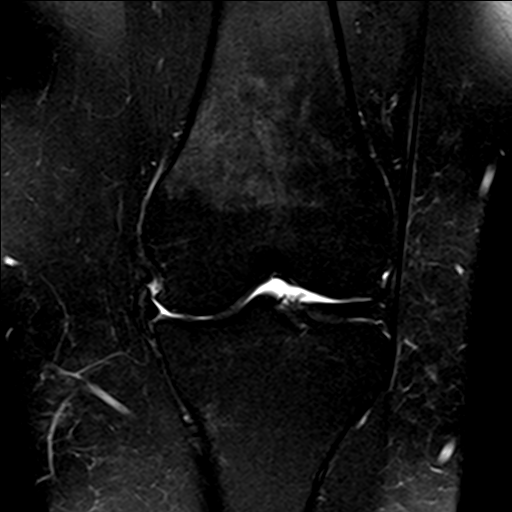
[im 16/19]
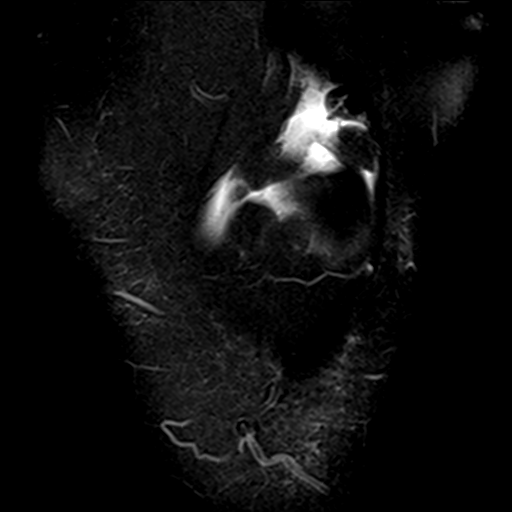

[Series 6: PD fat-sat · sagittal · 4.0mm · 0.31mm/px · 3 of 19 slices shown]
[im 4/19]
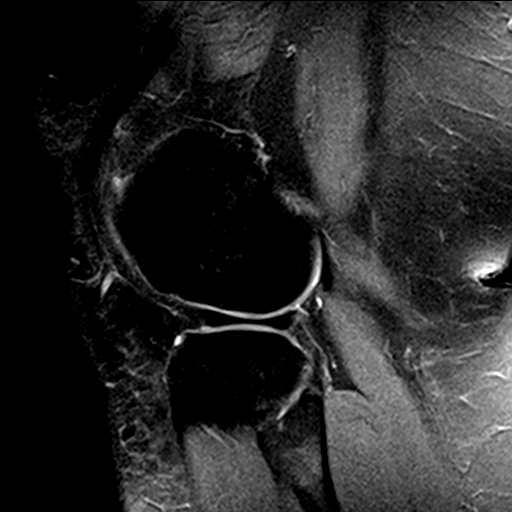
[im 10/19]
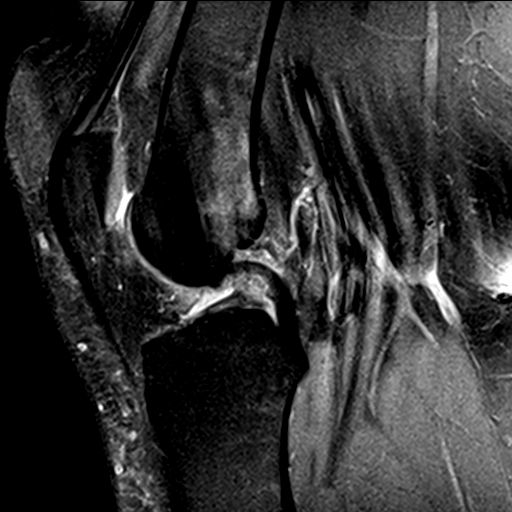
[im 16/19]
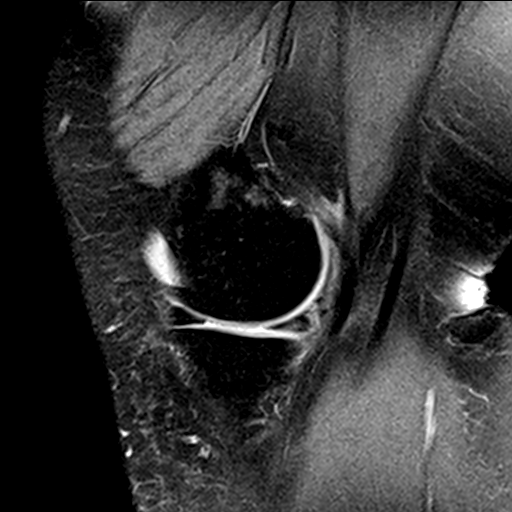

[15 of 40 positions shown; findings below may reference images not displayed]

FINDINGS: MENISCI

Medial meniscus: There is irregular tearing of the posterior horn
and body. There is truncation of the free edge of the body on the
coronal images. The meniscal root is irregular with a possible small
adjacent fragment. In addition, there is a probable flap fragment
within the meniscofemoral recess, best seen on axial image 14.

Lateral meniscus:  Intact with normal morphology.

LIGAMENTS

Cruciates: The anterior cruciate ligament is not well visualized and
is likely chronically torn. The PCL is intact.

Collaterals: Intact. There is mild MCL thickening which may be
secondary to an old injury.

CARTILAGE

Patellofemoral:  Preserved.

Medial: Mild chondral thinning without focal defect. Small
peripheral osteophytes.

Lateral:  Preserved.

OTHER

Joint:  Minimal joint fluid.

Popliteal Fossa:  Unremarkable. No significant Baker's cyst.

Extensor Mechanism: Intact. There is a moderate patella alta. There
are no signs recent transient patellar dislocation injury.

Bones:  No acute or significant extra-articular osseous findings.
IMPRESSION: 1. Complex tear of the posterior horn and body of the medial
meniscus. There is a probable meniscal flap fragment in the
meniscofemoral recess. There may be a small fragment adjacent to the
meniscal root.
2. Probable ACL deficient knee from remote ACL tear.
3. Early medial compartment degenerative changes with osteophytes.
4. Thickened MCL, possibly from old injury.

## 2017-10-29 ENCOUNTER — Encounter: Payer: Self-pay | Admitting: *Deleted

## 2017-11-01 ENCOUNTER — Ambulatory Visit: Payer: Self-pay

## 2017-11-01 ENCOUNTER — Encounter: Payer: Self-pay | Admitting: Obstetrics and Gynecology

## 2017-11-01 ENCOUNTER — Ambulatory Visit (INDEPENDENT_AMBULATORY_CARE_PROVIDER_SITE_OTHER): Payer: Medicaid Other | Admitting: *Deleted

## 2017-11-01 ENCOUNTER — Ambulatory Visit (INDEPENDENT_AMBULATORY_CARE_PROVIDER_SITE_OTHER): Payer: Medicaid Other | Admitting: Obstetrics and Gynecology

## 2017-11-01 VITALS — BP 118/70 | HR 90 | Wt 265.5 lb

## 2017-11-01 DIAGNOSIS — O24113 Pre-existing diabetes mellitus, type 2, in pregnancy, third trimester: Secondary | ICD-10-CM

## 2017-11-01 DIAGNOSIS — F1911 Other psychoactive substance abuse, in remission: Secondary | ICD-10-CM

## 2017-11-01 DIAGNOSIS — Z87898 Personal history of other specified conditions: Secondary | ICD-10-CM

## 2017-11-01 DIAGNOSIS — Z98891 History of uterine scar from previous surgery: Secondary | ICD-10-CM

## 2017-11-01 DIAGNOSIS — O0990 Supervision of high risk pregnancy, unspecified, unspecified trimester: Secondary | ICD-10-CM

## 2017-11-01 MED ORDER — DOCUSATE SODIUM 100 MG PO CAPS: 100.0000 mg | ORAL_CAPSULE | Freq: Two times a day (BID) | ORAL | 2 refills | Status: DC | PRN

## 2017-11-01 NOTE — Progress Notes (Signed)
Subjective:  Krystal [name] is a 32 y.o. G3P2002 at 4464w6d being seen today for ongoing prenatal care.  She is currently monitored for the following issues for this high-risk pregnancy and has Diabetes mellitus (HCC); OBESITY, NOS; TOBACCO DEPENDENCE; DEPRESSION, MILD; MIGRAINE, UNSPEC., W/O INTRACTABLE MIGRAINE; ECZEMA, ATOPIC; HIDRADENITIS SUPPURATIVA; GERD (gastroesophageal reflux disease); S/P C-section; Alopecia; Acne; Bilateral knee pain; Healthcare maintenance; Supervision of high risk pregnancy, antepartum; H/O: substance abuse; Rubella non-immune status, antepartum; and Diabetes in pregnancy on their problem list.  Patient reports constipation.  Contractions: Not present. Vag. Bleeding: None.  Movement: Present. Denies leaking of fluid.   The following portions of the patient's history were reviewed and updated as appropriate: allergies, current medications, past family history, past medical history, past social history, past surgical history and problem list. Problem list updated.  Objective:   Vitals:   11/01/17 1550  BP: 118/70  Pulse: 90  Weight: 265 lb 8 oz (120.4 kg)    Fetal Status: Fetal Heart Rate (bpm): NST   Movement: Present     General:  Alert, oriented and cooperative. Patient is in no acute distress.  Skin: Skin is warm and dry. No rash noted.   Cardiovascular: Normal heart rate noted  Respiratory: Normal respiratory effort, no problems with respiration noted  Abdomen: Soft, gravid, appropriate for gestational age. Pain/Pressure: Present     Pelvic:  Cervical exam deferred        Extremities: Normal range of motion.  Edema: Mild pitting, slight indentation  Mental Status: Normal mood and affect. Normal behavior. Normal judgment and thought content.   Urinalysis:      Assessment and Plan:  Pregnancy: G3P2002 at 864w6d  1. Supervision of high risk pregnancy, antepartum Stable Colace for constipation  2. Pre-existing type 2 diabetes mellitus during pregnancy  in third trimester CBG's in goal range Will continue with diet and current medication regiment BPP 10/10 today Growth scan scheduled Continue with weekly antenatal testing  3. S/P C-section TOLAC consent signed  4. H/O: substance abuse Stable On Subutex and followed by [facility] NICU tour scheduled  Preterm labor symptoms and general obstetric precautions including but not limited to vaginal bleeding, contractions, leaking of fluid and fetal movement were reviewed in detail with the patient. Please refer to After Visit Summary for other counseling recommendations.  Return in about 2 weeks (around 11/15/2017).   Hermina StaggersErvin, Michael L, MD

## 2017-11-01 NOTE — Patient Instructions (Signed)
Third Trimester of Pregnancy The third trimester is from week 28 through week 40 (months 7 through 9). The third trimester is a time when the unborn baby (fetus) is growing rapidly. At the end of the ninth month, the fetus is about 20 inches in length and weighs 6-10 pounds. Body changes during your third trimester Your body will continue to go through many changes during pregnancy. The changes vary from woman to woman. During the third trimester:  Your weight will continue to increase. You can expect to gain 25-35 pounds (11-16 kg) by the end of the pregnancy.  You may begin to get stretch marks on your hips, abdomen, and breasts.  You may urinate more often because the fetus is moving lower into your pelvis and pressing on your bladder.  You may develop or continue to have heartburn. This is caused by increased hormones that slow down muscles in the digestive tract.  You may develop or continue to have constipation because increased hormones slow digestion and cause the muscles that push waste through your intestines to relax.  You may develop hemorrhoids. These are swollen veins (varicose veins) in the rectum that can itch or be painful.  You may develop swollen, bulging veins (varicose veins) in your legs.  You may have increased body aches in the pelvis, back, or thighs. This is due to weight gain and increased hormones that are relaxing your joints.  You may have changes in your hair. These can include thickening of your hair, rapid growth, and changes in texture. Some women also have hair loss during or after pregnancy, or hair that feels dry or thin. Your hair will most likely return to normal after your baby is born.  Your breasts will continue to grow and they will continue to become tender. A yellow fluid (colostrum) may leak from your breasts. This is the first milk you are producing for your baby.  Your belly button may stick out.  You may notice more swelling in your hands,  face, or ankles.  You may have increased tingling or numbness in your hands, arms, and legs. The skin on your belly may also feel numb.  You may feel short of breath because of your expanding uterus.  You may have more problems sleeping. This can be caused by the size of your belly, increased need to urinate, and an increase in your body's metabolism.  You may notice the fetus "dropping," or moving lower in your abdomen (lightening).  You may have increased vaginal discharge.  You may notice your joints feel loose and you may have pain around your pelvic bone.  What to expect at prenatal visits You will have prenatal exams every 2 weeks until week 36. Then you will have weekly prenatal exams. During a routine prenatal visit:  You will be weighed to make sure you and the baby are growing normally.  Your blood pressure will be taken.  Your abdomen will be measured to track your baby's growth.  The fetal heartbeat will be listened to.  Any test results from the previous visit will be discussed.  You may have a cervical check near your due date to see if your cervix has softened or thinned (effaced).  You will be tested for Group B streptococcus. This happens between 35 and 37 weeks.  Your health care provider may ask you:  What your birth plan is.  How you are feeling.  If you are feeling the baby move.  If you have had   any abnormal symptoms, such as leaking fluid, bleeding, severe headaches, or abdominal cramping.  If you are using any tobacco products, including cigarettes, chewing tobacco, and electronic cigarettes.  If you have any questions.  Other tests or screenings that may be performed during your third trimester include:  Blood tests that check for low iron levels (anemia).  Fetal testing to check the health, activity level, and growth of the fetus. Testing is done if you have certain medical conditions or if there are problems during the  pregnancy.  Nonstress test (NST). This test checks the health of your baby to make sure there are no signs of problems, such as the baby not getting enough oxygen. During this test, a belt is placed around your belly. The baby is made to move, and its heart rate is monitored during movement.  What is false labor? False labor is a condition in which you feel small, irregular tightenings of the muscles in the womb (contractions) that usually go away with rest, changing position, or drinking water. These are called Braxton Hicks contractions. Contractions may last for hours, days, or even weeks before true labor sets in. If contractions come at regular intervals, become more frequent, increase in intensity, or become painful, you should see your health care provider. What are the signs of labor?  Abdominal cramps.  Regular contractions that start at 10 minutes apart and become stronger and more frequent with time.  Contractions that start on the top of the uterus and spread down to the lower abdomen and back.  Increased pelvic pressure and dull back pain.  A watery or bloody mucus discharge that comes from the vagina.  Leaking of amniotic fluid. This is also known as your "water breaking." It could be a slow trickle or a gush. Let your health care provider know if it has a color or strange odor. If you have any of these signs, call your health care provider right away, even if it is before your due date. Follow these instructions at home: Medicines  Follow your health care provider's instructions regarding medicine use. Specific medicines may be either safe or unsafe to take during pregnancy.  Take a prenatal vitamin that contains at least 600 micrograms (mcg) of folic acid.  If you develop constipation, try taking a stool softener if your health care provider approves. Eating and drinking  Eat a balanced diet that includes fresh fruits and vegetables, whole grains, good sources of protein  such as meat, eggs, or tofu, and low-fat dairy. Your health care provider will help you determine the amount of weight gain that is right for you.  Avoid raw meat and uncooked cheese. These carry germs that can cause birth defects in the baby.  If you have low calcium intake from food, talk to your health care provider about whether you should take a daily calcium supplement.  Eat four or five small meals rather than three large meals a day.  Limit foods that are high in fat and processed sugars, such as fried and sweet foods.  To prevent constipation: ? Drink enough fluid to keep your urine clear or pale yellow. ? Eat foods that are high in fiber, such as fresh fruits and vegetables, whole grains, and beans. Activity  Exercise only as directed by your health care provider. Most women can continue their usual exercise routine during pregnancy. Try to exercise for 30 minutes at least 5 days a week. Stop exercising if you experience uterine contractions.  Avoid heavy   lifting.  Do not exercise in extreme heat or humidity, or at high altitudes.  Wear low-heel, comfortable shoes.  Practice good posture.  You may continue to have sex unless your health care provider tells you otherwise. Relieving pain and discomfort  Take frequent breaks and rest with your legs elevated if you have leg cramps or low back pain.  Take warm sitz baths to soothe any pain or discomfort caused by hemorrhoids. Use hemorrhoid cream if your health care provider approves.  Wear a good support bra to prevent discomfort from breast tenderness.  If you develop varicose veins: ? Wear support pantyhose or compression stockings as told by your healthcare provider. ? Elevate your feet for 15 minutes, 3-4 times a day. Prenatal care  Write down your questions. Take them to your prenatal visits.  Keep all your prenatal visits as told by your health care provider. This is important. Safety  Wear your seat belt at  all times when driving.  Make a list of emergency phone numbers, including numbers for family, friends, the hospital, and police and fire departments. General instructions  Avoid cat litter boxes and soil used by cats. These carry germs that can cause birth defects in the baby. If you have a cat, ask someone to clean the litter box for you.  Do not travel far distances unless it is absolutely necessary and only with the approval of your health care provider.  Do not use hot tubs, steam rooms, or saunas.  Do not drink alcohol.  Do not use any products that contain nicotine or tobacco, such as cigarettes and e-cigarettes. If you need help quitting, ask your health care provider.  Do not use any medicinal herbs or unprescribed drugs. These chemicals affect the formation and growth of the baby.  Do not douche or use tampons or scented sanitary pads.  Do not cross your legs for long periods of time.  To prepare for the arrival of your baby: ? Take prenatal classes to understand, practice, and ask questions about labor and delivery. ? Make a trial run to the hospital. ? Visit the hospital and tour the maternity area. ? Arrange for maternity or paternity leave through employers. ? Arrange for family and friends to take care of pets while you are in the hospital. ? Purchase a rear-facing car seat and make sure you know how to install it in your car. ? Pack your hospital bag. ? Prepare the baby's nursery. Make sure to remove all pillows and stuffed animals from the baby's crib to prevent suffocation.  Visit your dentist if you have not gone during your pregnancy. Use a soft toothbrush to brush your teeth and be gentle when you floss. Contact a health care provider if:  You are unsure if you are in labor or if your water has broken.  You become dizzy.  You have mild pelvic cramps, pelvic pressure, or nagging pain in your abdominal area.  You have lower back pain.  You have persistent  nausea, vomiting, or diarrhea.  You have an unusual or bad smelling vaginal discharge.  You have pain when you urinate. Get help right away if:  Your water breaks before 37 weeks.  You have regular contractions less than 5 minutes apart before 37 weeks.  You have a fever.  You are leaking fluid from your vagina.  You have spotting or bleeding from your vagina.  You have severe abdominal pain or cramping.  You have rapid weight loss or weight gain.    You have shortness of breath with chest pain.  You notice sudden or extreme swelling of your face, hands, ankles, feet, or legs.  Your baby makes fewer than 10 movements in 2 hours.  You have severe headaches that do not go away when you take medicine.  You have vision changes. Summary  The third trimester is from week 28 through week 40, months 7 through 9. The third trimester is a time when the unborn baby (fetus) is growing rapidly.  During the third trimester, your discomfort may increase as you and your baby continue to gain weight. You may have abdominal, leg, and back pain, sleeping problems, and an increased need to urinate.  During the third trimester your breasts will keep growing and they will continue to become tender. A yellow fluid (colostrum) may leak from your breasts. This is the first milk you are producing for your baby.  False labor is a condition in which you feel small, irregular tightenings of the muscles in the womb (contractions) that eventually go away. These are called Braxton Hicks contractions. Contractions may last for hours, days, or even weeks before true labor sets in.  Signs of labor can include: abdominal cramps; regular contractions that start at 10 minutes apart and become stronger and more frequent with time; watery or bloody mucus discharge that comes from the vagina; increased pelvic pressure and dull back pain; and leaking of amniotic fluid. This information is not intended to replace advice  given to you by your health care provider. Make sure you discuss any questions you have with your health care provider. Document Released: 07/18/2001 Document Revised: 12/30/2015 Document Reviewed: 09/24/2012 Elsevier Interactive Patient Education  2017 [employer]  

## 2017-11-01 NOTE — Progress Notes (Signed)

## 2017-11-08 ENCOUNTER — Telehealth: Payer: Self-pay | Admitting: *Deleted

## 2017-11-08 ENCOUNTER — Encounter: Payer: Self-pay | Admitting: Obstetrics and Gynecology

## 2017-11-08 ENCOUNTER — Other Ambulatory Visit: Payer: Self-pay

## 2017-11-08 NOTE — Telephone Encounter (Signed)
Called pt and left message on her personal voice mail. I stated that I am checking to see if she is ok since she has not yet arrived for her scheduled appt  @ 1515 today. I asked her to call the office or send message via My Chart to let us know if she is still coming to the appt of if there is a problem. I also asked that she let us know if the baby is moving well.

## 2017-11-09 NOTE — Progress Notes (Signed)
Patient did not keep OB appointment for 11/08/2017.  Janei Scheff, Jr MD Attending [facility] (Faculty Practice)   

## 2017-11-15 ENCOUNTER — Encounter: Payer: Self-pay | Admitting: Obstetrics & Gynecology

## 2017-11-15 ENCOUNTER — Other Ambulatory Visit: Payer: Self-pay

## 2017-11-16 ENCOUNTER — Other Ambulatory Visit: Payer: Self-pay | Admitting: Obstetrics and Gynecology

## 2017-11-16 ENCOUNTER — Ambulatory Visit (HOSPITAL_COMMUNITY)
Admission: RE | Admit: 2017-11-16 | Discharge: 2017-11-16 | Disposition: A | Discharge status: Home / Self Care | Payer: Medicaid Other | Source: Ambulatory Visit | Attending: Obstetrics and Gynecology | Admitting: Obstetrics and Gynecology

## 2017-11-16 ENCOUNTER — Ambulatory Visit (INDEPENDENT_AMBULATORY_CARE_PROVIDER_SITE_OTHER): Payer: Medicaid Other | Admitting: Obstetrics & Gynecology

## 2017-11-16 ENCOUNTER — Other Ambulatory Visit (HOSPITAL_COMMUNITY): Payer: Self-pay | Admitting: Obstetrics and Gynecology

## 2017-11-16 ENCOUNTER — Encounter (HOSPITAL_COMMUNITY): Payer: Self-pay

## 2017-11-16 ENCOUNTER — Ambulatory Visit (INDEPENDENT_AMBULATORY_CARE_PROVIDER_SITE_OTHER): Payer: Medicaid Other | Admitting: *Deleted

## 2017-11-16 VITALS — BP 123/60 | HR 90 | Wt 267.1 lb

## 2017-11-16 DIAGNOSIS — Z362 Encounter for other antenatal screening follow-up: Secondary | ICD-10-CM

## 2017-11-16 DIAGNOSIS — O24113 Pre-existing diabetes mellitus, type 2, in pregnancy, third trimester: Secondary | ICD-10-CM

## 2017-11-16 DIAGNOSIS — O0993 Supervision of high risk pregnancy, unspecified, third trimester: Secondary | ICD-10-CM

## 2017-11-16 DIAGNOSIS — Z3A35 35 weeks gestation of pregnancy: Secondary | ICD-10-CM | POA: Diagnosis not present

## 2017-11-16 DIAGNOSIS — Z87898 Personal history of other specified conditions: Secondary | ICD-10-CM

## 2017-11-16 DIAGNOSIS — O09293 Supervision of pregnancy with other poor reproductive or obstetric history, third trimester: Secondary | ICD-10-CM | POA: Diagnosis present

## 2017-11-16 DIAGNOSIS — O0990 Supervision of high risk pregnancy, unspecified, unspecified trimester: Secondary | ICD-10-CM

## 2017-11-16 DIAGNOSIS — O403XX0 Polyhydramnios, third trimester, not applicable or unspecified: Secondary | ICD-10-CM

## 2017-11-16 DIAGNOSIS — F1911 Other psychoactive substance abuse, in remission: Secondary | ICD-10-CM

## 2017-11-16 DIAGNOSIS — O24119 Pre-existing diabetes mellitus, type 2, in pregnancy, unspecified trimester: Secondary | ICD-10-CM

## 2017-11-16 DIAGNOSIS — Z98891 History of uterine scar from previous surgery: Secondary | ICD-10-CM

## 2017-11-16 DIAGNOSIS — O99333 Smoking (tobacco) complicating pregnancy, third trimester: Secondary | ICD-10-CM | POA: Diagnosis not present

## 2017-11-16 DIAGNOSIS — O99213 Obesity complicating pregnancy, third trimester: Secondary | ICD-10-CM | POA: Insufficient documentation

## 2017-11-16 DIAGNOSIS — O34219 Maternal care for unspecified type scar from previous cesarean delivery: Secondary | ICD-10-CM | POA: Diagnosis not present

## 2017-11-16 NOTE — Progress Notes (Signed)
US for growth and BPP today 

## 2017-11-16 NOTE — Progress Notes (Signed)
   PRENATAL VISIT NOTE  Subjective:  Krystal [name] is a 32 y.o. G3P2002 at 5855w0d being seen today for ongoing prenatal care.  She is currently monitored for the following issues for this high-risk pregnancy and has Diabetes mellitus (HCC); OBESITY, NOS; TOBACCO DEPENDENCE; DEPRESSION, MILD; MIGRAINE, UNSPEC., W/O INTRACTABLE MIGRAINE; ECZEMA, ATOPIC; HIDRADENITIS SUPPURATIVA; GERD (gastroesophageal reflux disease); S/P C-section; Alopecia; Acne; Bilateral knee pain; Healthcare maintenance; Supervision of high risk pregnancy, antepartum; H/O: substance abuse; Rubella non-immune status, antepartum; and Diabetes in pregnancy on their problem list.  Patient reports no complaints.  Contractions: Irregular. Vag. Bleeding: None.  Movement: Present. Denies leaking of fluid.   The following portions of the patient's history were reviewed and updated as appropriate: allergies, current medications, past family history, past medical history, past social history, past surgical history and problem list. Problem list updated.  Objective:   Vitals:   11/16/17 0833  BP: 123/60  Pulse: 90  Weight: 267 lb 1.6 oz (121.2 kg)    Fetal Status: Fetal Heart Rate (bpm): NST   Movement: Present     General:  Alert, oriented and cooperative. Patient is in no acute distress.  Skin: Skin is warm and dry. No rash noted.   Cardiovascular: Normal heart rate noted  Respiratory: Normal respiratory effort, no problems with respiration noted  Abdomen: Soft, gravid, appropriate for gestational age.  Pain/Pressure: Present     Pelvic: Cervical exam deferred        Extremities: Normal range of motion.     Mental Status: Normal mood and affect. Normal behavior. Normal judgment and thought content.   Assessment and Plan:  Pregnancy: G3P2002 at 4855w0d  1. Supervision of high risk pregnancy, antepartum - cervical cultures at next visit - She has MFM u/s today  2. Pre-existing type 2 diabetes mellitus during pregnancy  in third trimester - She forget to bring her sugars, says that fastings are less than 90, says she checks 2 hours after meals, between 90-180. I have rec'd that she bring in her meter next time - Hemoglobin A1c  3. H/O: substance abuse - on subutex  4. [facility]P [facility]  Preterm labor symptoms and general obstetric precautions including but not limited to vaginal bleeding, contractions, leaking of fluid and fetal movement were reviewed in detail with the patient. Please refer to After Visit Summary for other counseling recommendations.  Return in about 1 week (around 11/23/2017) for NST/BPP and HOB weekly x3 weeks.  Future Appointments  Date Time Provider Department Center  11/16/2017 10:30 AM [facility] US 1 [facility] M[facility]-US    Allie BossierMyra C Staley Budzinski, MD

## 2017-11-21 ENCOUNTER — Ambulatory Visit: Payer: Self-pay

## 2017-11-21 ENCOUNTER — Ambulatory Visit: Payer: Self-pay | Admitting: *Deleted

## 2017-11-21 ENCOUNTER — Encounter: Payer: Medicaid Other | Admitting: Obstetrics & Gynecology

## 2017-11-21 ENCOUNTER — Telehealth: Payer: Self-pay | Admitting: *Deleted

## 2017-11-21 DIAGNOSIS — O24113 Pre-existing diabetes mellitus, type 2, in pregnancy, third trimester: Secondary | ICD-10-CM

## 2017-11-21 NOTE — Progress Notes (Deleted)

## 2017-11-21 NOTE — Telephone Encounter (Signed)
Called pt and left message on her personal voice mail inquiring if she is on the way to her scheduled appt @ 1415 today.  We will work her in when she gets here. Please send a message via MyChart if she is not coming to the appt as scheduled.

## 2017-11-28 NOTE — Progress Notes (Signed)
On 4/17 pt was arrived @ 1430 for the appt scheduled with Dr. Marice Potterove @ 1515. I called pt from lobby in order to begin her scheduled 1415 appt for fetal testing and she was not there. Per conversation w/Cheryl Clinton, registrar, pt had mistakenly been arrived for 1515 appt w/Dr. Marice Potterove and was not actually present. Pt did not answer telephone when I called her as stated in telephone encounter on 4/17 @ 1506.

## 2017-11-30 ENCOUNTER — Ambulatory Visit: Payer: Self-pay

## 2017-11-30 ENCOUNTER — Ambulatory Visit (INDEPENDENT_AMBULATORY_CARE_PROVIDER_SITE_OTHER): Payer: Medicaid Other | Admitting: Obstetrics & Gynecology

## 2017-11-30 ENCOUNTER — Ambulatory Visit (INDEPENDENT_AMBULATORY_CARE_PROVIDER_SITE_OTHER): Payer: Medicaid Other | Admitting: *Deleted

## 2017-11-30 VITALS — BP 119/70 | HR 91 | Wt 274.0 lb

## 2017-11-30 DIAGNOSIS — O24113 Pre-existing diabetes mellitus, type 2, in pregnancy, third trimester: Secondary | ICD-10-CM

## 2017-11-30 DIAGNOSIS — O34219 Maternal care for unspecified type scar from previous cesarean delivery: Secondary | ICD-10-CM

## 2017-11-30 DIAGNOSIS — Z87898 Personal history of other specified conditions: Secondary | ICD-10-CM

## 2017-11-30 DIAGNOSIS — E118 Type 2 diabetes mellitus with unspecified complications: Secondary | ICD-10-CM

## 2017-11-30 DIAGNOSIS — O0993 Supervision of high risk pregnancy, unspecified, third trimester: Secondary | ICD-10-CM

## 2017-11-30 DIAGNOSIS — Z98891 History of uterine scar from previous surgery: Secondary | ICD-10-CM

## 2017-11-30 DIAGNOSIS — O0990 Supervision of high risk pregnancy, unspecified, unspecified trimester: Secondary | ICD-10-CM

## 2017-11-30 DIAGNOSIS — F1911 Other psychoactive substance abuse, in remission: Secondary | ICD-10-CM

## 2017-11-30 NOTE — Progress Notes (Signed)

## 2017-11-30 NOTE — Progress Notes (Signed)
   PRENATAL VISIT NOTE  Subjective:  Krystal [name] is a 32 y.o. G3P2002 at 6354w0d being seen today for ongoing prenatal care.  She is currently monitored for the following issues for this high-risk pregnancy and has Diabetes mellitus (HCC); OBESITY, NOS; TOBACCO DEPENDENCE; DEPRESSION, MILD; MIGRAINE, UNSPEC., W/O INTRACTABLE MIGRAINE; ECZEMA, ATOPIC; HIDRADENITIS SUPPURATIVA; GERD (gastroesophageal reflux disease); S/P C-section; Alopecia; Acne; Bilateral knee pain; Healthcare maintenance; Supervision of high risk pregnancy, antepartum; H/O: substance abuse; Rubella non-immune status, antepartum; and Diabetes in pregnancy on their problem list.  Patient reports no complaints.  Contractions: Irregular. Vag. Bleeding: None.  Movement: Present. Denies leaking of fluid.   The following portions of the patient's history were reviewed and updated as appropriate: allergies, current medications, past family history, past medical history, past social history, past surgical history and problem list. Problem list updated.  Objective:   Vitals:   11/30/17 0933  BP: 119/70  Pulse: 91  Weight: 274 lb (124.3 kg)   She did not bring her sugars or meter in today.  Fetal Status: Fetal Heart Rate (bpm): NST   Movement: Present     General:  Alert, oriented and cooperative. Patient is in no acute distress.  Skin: Skin is warm and dry. No rash noted.   Cardiovascular: Normal heart rate noted  Respiratory: Normal respiratory effort, no problems with respiration noted  Abdomen: Soft, gravid, appropriate for gestational age.  Pain/Pressure: Present     Pelvic: Cervical exam deferred        Extremities: Normal range of motion.  Edema: Mild pitting, slight indentation  Mental Status: Normal mood and affect. Normal behavior. Normal judgment and thought content.   Assessment and Plan:  Pregnancy: G3P2002 at 6554w0d  1. Supervision of high risk pregnancy, antepartum  - US MFM OB FOLLOW UP; Future - US MFM  FETAL BPP WO NON STRESS; Future - Hemoglobin A1c  2. Pre-existing type 2 diabetes mellitus during pregnancy in third trimester  - US MFM OB FOLLOW UP; Future - US MFM FETAL BPP WO NON STRESS; Future - Hemoglobin A1c - Hemoglobin A1c  3. H/O: substance abuse   4. S/P C-section  5. Type 2 diabetes mellitus with complication, without long-term current use of insulin (HCC)   Preterm labor symptoms and general obstetric precautions including but not limited to vaginal bleeding, contractions, leaking of fluid and fetal movement were reviewed in detail with the patient. Please refer to After Visit Summary for other counseling recommendations.  No follow-ups on file.  Future Appointments  Date Time Provider Department Center  12/07/2017  9:15 AM [facility] US 4 [facility] M[facility]-US  12/07/2017 10:15 AM Allie Bossierove, Falisa Lamora C, MD [facility]-WOCA WOC  12/07/2017 11:15 AM [facility] NST [facility] WOC  12/14/2017  9:15 AM [facility] NST [facility]A WOC  12/14/2017 10:15 AM Allie Bossierove, Yechiel Erny C, MD [facility]OC-WOCA WOC    Allie BossierMyra C Deneise Getty, MD

## 2017-11-30 NOTE — Addendum Note (Signed)
Addended by: Jill SideAY, DIANE L on: 11/30/2017 11:19 AM   Modules accepted: Orders

## 2017-11-30 NOTE — Progress Notes (Signed)
Pt reports difficulty sleeping.  US growth and BPP scheduled on 5/3

## 2017-12-01 LAB — HEMOGLOBIN A1C
ESTIMATED AVERAGE GLUCOSE: 114 mg/dL
Hgb A1c MFr Bld: 5.6 % (ref 4.8–5.6)

## 2017-12-03 NOTE — Progress Notes (Signed)
I have reviewed this chart and agree with the RN/CMA assessment and management.    Jaycey Gens C Anson Peddie, MD, FACOG Attending Physician, Faculty Practice [facility]  

## 2017-12-07 ENCOUNTER — Other Ambulatory Visit: Payer: Self-pay | Admitting: Family Medicine

## 2017-12-07 ENCOUNTER — Other Ambulatory Visit (HOSPITAL_COMMUNITY)
Admission: RE | Admit: 2017-12-07 | Discharge: 2017-12-07 | Disposition: A | Discharge status: Home / Self Care | Payer: Medicaid Other | Source: Ambulatory Visit | Attending: Obstetrics & Gynecology | Admitting: Obstetrics & Gynecology

## 2017-12-07 ENCOUNTER — Encounter: Payer: Self-pay | Admitting: Obstetrics & Gynecology

## 2017-12-07 ENCOUNTER — Ambulatory Visit (HOSPITAL_COMMUNITY)
Admission: RE | Admit: 2017-12-07 | Discharge: 2017-12-07 | Disposition: A | Discharge status: Home / Self Care | Payer: Medicaid Other | Source: Ambulatory Visit | Attending: Obstetrics & Gynecology | Admitting: Obstetrics & Gynecology

## 2017-12-07 ENCOUNTER — Ambulatory Visit (INDEPENDENT_AMBULATORY_CARE_PROVIDER_SITE_OTHER): Payer: Medicaid Other | Admitting: Obstetrics & Gynecology

## 2017-12-07 ENCOUNTER — Encounter (HOSPITAL_COMMUNITY): Payer: Self-pay

## 2017-12-07 ENCOUNTER — Ambulatory Visit (INDEPENDENT_AMBULATORY_CARE_PROVIDER_SITE_OTHER): Payer: Medicaid Other | Admitting: General Practice

## 2017-12-07 ENCOUNTER — Other Ambulatory Visit: Payer: Self-pay | Admitting: Obstetrics & Gynecology

## 2017-12-07 VITALS — BP 119/69 | HR 97 | Wt 274.0 lb

## 2017-12-07 DIAGNOSIS — Z3A38 38 weeks gestation of pregnancy: Secondary | ICD-10-CM | POA: Diagnosis not present

## 2017-12-07 DIAGNOSIS — O99213 Obesity complicating pregnancy, third trimester: Secondary | ICD-10-CM | POA: Diagnosis not present

## 2017-12-07 DIAGNOSIS — O24113 Pre-existing diabetes mellitus, type 2, in pregnancy, third trimester: Secondary | ICD-10-CM

## 2017-12-07 DIAGNOSIS — O34219 Maternal care for unspecified type scar from previous cesarean delivery: Secondary | ICD-10-CM | POA: Diagnosis not present

## 2017-12-07 DIAGNOSIS — Z3A00 Weeks of gestation of pregnancy not specified: Secondary | ICD-10-CM | POA: Insufficient documentation

## 2017-12-07 DIAGNOSIS — Z87898 Personal history of other specified conditions: Secondary | ICD-10-CM

## 2017-12-07 DIAGNOSIS — O0990 Supervision of high risk pregnancy, unspecified, unspecified trimester: Secondary | ICD-10-CM

## 2017-12-07 DIAGNOSIS — O99333 Smoking (tobacco) complicating pregnancy, third trimester: Secondary | ICD-10-CM | POA: Diagnosis not present

## 2017-12-07 DIAGNOSIS — O09293 Supervision of pregnancy with other poor reproductive or obstetric history, third trimester: Secondary | ICD-10-CM | POA: Insufficient documentation

## 2017-12-07 DIAGNOSIS — Z362 Encounter for other antenatal screening follow-up: Secondary | ICD-10-CM | POA: Insufficient documentation

## 2017-12-07 DIAGNOSIS — F1911 Other psychoactive substance abuse, in remission: Secondary | ICD-10-CM

## 2017-12-07 DIAGNOSIS — Z98891 History of uterine scar from previous surgery: Secondary | ICD-10-CM

## 2017-12-07 LAB — OB RESULTS CONSOLE GBS: GBS: NEGATIVE

## 2017-12-07 NOTE — Patient Instructions (Signed)

## 2017-12-10 ENCOUNTER — Telehealth (HOSPITAL_COMMUNITY): Payer: Self-pay | Admitting: *Deleted

## 2017-12-10 ENCOUNTER — Encounter (HOSPITAL_COMMUNITY): Payer: Self-pay | Admitting: *Deleted

## 2017-12-10 LAB — CERVICOVAGINAL ANCILLARY ONLY
CHLAMYDIA, DNA PROBE: NEGATIVE
Neisseria Gonorrhea: NEGATIVE

## 2017-12-10 NOTE — Telephone Encounter (Signed)
Preadmission screen  

## 2017-12-10 NOTE — Progress Notes (Signed)
I have reviewed this chart and agree with the RN/CMA assessment and management.    Zelma Snead C Launa Goedken, MD, FACOG Attending Physician, Faculty Practice [facility]  

## 2017-12-11 ENCOUNTER — Other Ambulatory Visit: Payer: Self-pay | Admitting: Advanced Practice Midwife

## 2017-12-11 LAB — CULTURE, BETA STREP (GROUP B ONLY): Strep Gp B Culture: NEGATIVE

## 2017-12-12 ENCOUNTER — Encounter: Payer: Self-pay | Admitting: *Deleted

## 2017-12-14 ENCOUNTER — Encounter: Payer: Self-pay | Admitting: Obstetrics & Gynecology

## 2017-12-14 ENCOUNTER — Inpatient Hospital Stay (HOSPITAL_COMMUNITY): Payer: Medicaid Other | Admitting: Anesthesiology

## 2017-12-14 ENCOUNTER — Encounter (HOSPITAL_COMMUNITY): Payer: Self-pay

## 2017-12-14 ENCOUNTER — Inpatient Hospital Stay (HOSPITAL_COMMUNITY)
Admission: RE | Admit: 2017-12-14 | Discharge: 2017-12-17 | DRG: 806 | Disposition: A | Discharge status: Home / Self Care | Payer: Medicaid Other | Source: Ambulatory Visit | Attending: Obstetrics and Gynecology | Admitting: Obstetrics and Gynecology

## 2017-12-14 ENCOUNTER — Other Ambulatory Visit: Payer: Self-pay

## 2017-12-14 DIAGNOSIS — Z23 Encounter for immunization: Secondary | ICD-10-CM | POA: Diagnosis not present

## 2017-12-14 DIAGNOSIS — O34219 Maternal care for unspecified type scar from previous cesarean delivery: Secondary | ICD-10-CM | POA: Diagnosis present

## 2017-12-14 DIAGNOSIS — O99324 Drug use complicating childbirth: Secondary | ICD-10-CM | POA: Diagnosis present

## 2017-12-14 DIAGNOSIS — Z3A39 39 weeks gestation of pregnancy: Secondary | ICD-10-CM

## 2017-12-14 DIAGNOSIS — F1911 Other psychoactive substance abuse, in remission: Secondary | ICD-10-CM

## 2017-12-14 DIAGNOSIS — K219 Gastro-esophageal reflux disease without esophagitis: Secondary | ICD-10-CM | POA: Diagnosis present

## 2017-12-14 DIAGNOSIS — O9962 Diseases of the digestive system complicating childbirth: Secondary | ICD-10-CM | POA: Diagnosis present

## 2017-12-14 DIAGNOSIS — Z7984 Long term (current) use of oral hypoglycemic drugs: Secondary | ICD-10-CM

## 2017-12-14 DIAGNOSIS — O2412 Pre-existing diabetes mellitus, type 2, in childbirth: Principal | ICD-10-CM | POA: Diagnosis present

## 2017-12-14 DIAGNOSIS — O99214 Obesity complicating childbirth: Secondary | ICD-10-CM | POA: Diagnosis present

## 2017-12-14 DIAGNOSIS — O09899 Supervision of other high risk pregnancies, unspecified trimester: Secondary | ICD-10-CM

## 2017-12-14 DIAGNOSIS — O99334 Smoking (tobacco) complicating childbirth: Secondary | ICD-10-CM | POA: Diagnosis present

## 2017-12-14 DIAGNOSIS — O0990 Supervision of high risk pregnancy, unspecified, unspecified trimester: Secondary | ICD-10-CM

## 2017-12-14 DIAGNOSIS — E119 Type 2 diabetes mellitus without complications: Secondary | ICD-10-CM | POA: Diagnosis present

## 2017-12-14 DIAGNOSIS — Z3490 Encounter for supervision of normal pregnancy, unspecified, unspecified trimester: Secondary | ICD-10-CM | POA: Diagnosis present

## 2017-12-14 DIAGNOSIS — O9989 Other specified diseases and conditions complicating pregnancy, childbirth and the puerperium: Secondary | ICD-10-CM

## 2017-12-14 DIAGNOSIS — F17210 Nicotine dependence, cigarettes, uncomplicated: Secondary | ICD-10-CM | POA: Diagnosis present

## 2017-12-14 DIAGNOSIS — Z283 Underimmunization status: Secondary | ICD-10-CM

## 2017-12-14 LAB — CBC
HCT: 38.7 % (ref 36.0–46.0)
Hemoglobin: 13 g/dL (ref 12.0–15.0)
MCH: 30.4 pg (ref 26.0–34.0)
MCHC: 33.6 g/dL (ref 30.0–36.0)
MCV: 90.6 fL (ref 78.0–100.0)
PLATELETS: 214 10*3/uL (ref 150–400)
RBC: 4.27 MIL/uL (ref 3.87–5.11)
RDW: 13.3 % (ref 11.5–15.5)
WBC: 8.9 10*3/uL (ref 4.0–10.5)

## 2017-12-14 LAB — GLUCOSE, CAPILLARY
GLUCOSE-CAPILLARY: 107 mg/dL — AB (ref 65–99)
Glucose-Capillary: 141 mg/dL — ABNORMAL HIGH (ref 65–99)
Glucose-Capillary: 74 mg/dL (ref 65–99)
Glucose-Capillary: 87 mg/dL (ref 65–99)

## 2017-12-14 LAB — RAPID URINE DRUG SCREEN, HOSP PERFORMED
Amphetamines: NOT DETECTED
BARBITURATES: NOT DETECTED
Benzodiazepines: NOT DETECTED
COCAINE: NOT DETECTED
Opiates: NOT DETECTED
Tetrahydrocannabinol: NOT DETECTED

## 2017-12-14 LAB — TYPE AND SCREEN
ABO/RH(D): A POS
ANTIBODY SCREEN: NEGATIVE

## 2017-12-14 LAB — RPR: RPR Ser Ql: NONREACTIVE

## 2017-12-14 MED ORDER — LIDOCAINE HCL (PF) 1 % IJ SOLN
30.0000 mL | INTRAMUSCULAR | Status: DC | PRN
  Filled 2017-12-14: qty 30

## 2017-12-14 MED ORDER — LACTATED RINGERS IV SOLN
INTRAVENOUS | Status: DC
  Administered 2017-12-14 (×3): via INTRAVENOUS

## 2017-12-14 MED ORDER — LACTATED RINGERS IV SOLN
500.0000 mL | Freq: Once | INTRAVENOUS | Status: AC
  Administered 2017-12-14: 500 mL via INTRAVENOUS

## 2017-12-14 MED ORDER — OXYCODONE-ACETAMINOPHEN 5-325 MG PO TABS: 2.0000 | ORAL_TABLET | ORAL | Status: DC | PRN

## 2017-12-14 MED ORDER — DIPHENHYDRAMINE HCL 50 MG/ML IJ SOLN: 12.5000 mg | INTRAMUSCULAR | Status: DC | PRN

## 2017-12-14 MED ORDER — OXYTOCIN 40 UNITS IN LACTATED RINGERS INFUSION - SIMPLE MED: 2.5000 [IU]/h | INTRAVENOUS | Status: DC

## 2017-12-14 MED ORDER — OXYTOCIN BOLUS FROM INFUSION
500.0000 mL | Freq: Once | INTRAVENOUS | Status: AC
  Administered 2017-12-15: 500 mL via INTRAVENOUS

## 2017-12-14 MED ORDER — SOD CITRATE-CITRIC ACID 500-334 MG/5ML PO SOLN: 30.0000 mL | ORAL | Status: DC | PRN

## 2017-12-14 MED ORDER — PANTOPRAZOLE SODIUM 20 MG PO TBEC
20.0000 mg | DELAYED_RELEASE_TABLET | Freq: Every day | ORAL | Status: DC
  Administered 2017-12-14: 20 mg via ORAL
  Filled 2017-12-14 (×2): qty 1

## 2017-12-14 MED ORDER — LIDOCAINE HCL (PF) 1 % IJ SOLN
INTRAMUSCULAR | Status: DC | PRN
  Administered 2017-12-14 (×2): 5 mL via EPIDURAL

## 2017-12-14 MED ORDER — ACETAMINOPHEN 325 MG PO TABS: 650.0000 mg | ORAL_TABLET | ORAL | Status: DC | PRN

## 2017-12-14 MED ORDER — FENTANYL 2.5 MCG/ML BUPIVACAINE 1/10 % EPIDURAL INFUSION (WH - ANES)
14.0000 mL/h | INTRAMUSCULAR | Status: DC | PRN
  Administered 2017-12-14 – 2017-12-15 (×3): 14 mL/h via EPIDURAL
  Filled 2017-12-14 (×3): qty 100

## 2017-12-14 MED ORDER — EPHEDRINE 5 MG/ML INJ
10.0000 mg | INTRAVENOUS | Status: DC | PRN
  Filled 2017-12-14: qty 2

## 2017-12-14 MED ORDER — LIDOCAINE-EPINEPHRINE (PF) 2 %-1:200000 IJ SOLN
INTRAMUSCULAR | Status: DC | PRN
  Administered 2017-12-14: 3 mL via EPIDURAL
  Administered 2017-12-14 – 2017-12-15 (×2): 5 mL via EPIDURAL
  Administered 2017-12-15: 2 mL via EPIDURAL

## 2017-12-14 MED ORDER — PHENYLEPHRINE 40 MCG/ML (10ML) SYRINGE FOR IV PUSH (FOR BLOOD PRESSURE SUPPORT)
80.0000 ug | PREFILLED_SYRINGE | INTRAVENOUS | Status: DC | PRN
  Filled 2017-12-14: qty 10
  Filled 2017-12-14: qty 5

## 2017-12-14 MED ORDER — TERBUTALINE SULFATE 1 MG/ML IJ SOLN
0.2500 mg | Freq: Once | INTRAMUSCULAR | Status: DC | PRN
  Filled 2017-12-14: qty 1

## 2017-12-14 MED ORDER — FENTANYL CITRATE (PF) 100 MCG/2ML IJ SOLN: 100.0000 ug | INTRAMUSCULAR | Status: DC | PRN

## 2017-12-14 MED ORDER — ONDANSETRON HCL 4 MG/2ML IJ SOLN: 4.0000 mg | Freq: Four times a day (QID) | INTRAMUSCULAR | Status: DC | PRN

## 2017-12-14 MED ORDER — BUPRENORPHINE HCL 8 MG SL SUBL
8.0000 mg | SUBLINGUAL_TABLET | Freq: Three times a day (TID) | SUBLINGUAL | Status: DC
  Administered 2017-12-14 – 2017-12-17 (×10): 8 mg via SUBLINGUAL
  Filled 2017-12-14 (×10): qty 1

## 2017-12-14 MED ORDER — OXYCODONE-ACETAMINOPHEN 5-325 MG PO TABS
1.0000 | ORAL_TABLET | ORAL | Status: DC | PRN
Start: 2017-12-14 — End: 2017-12-15

## 2017-12-14 MED ORDER — OXYTOCIN 40 UNITS IN LACTATED RINGERS INFUSION - SIMPLE MED
1.0000 m[IU]/min | INTRAVENOUS | Status: DC
  Administered 2017-12-14: 2 m[IU]/min via INTRAVENOUS
  Administered 2017-12-14: 4 m[IU]/min via INTRAVENOUS
  Administered 2017-12-14: 2 m[IU]/min via INTRAVENOUS
  Filled 2017-12-14: qty 1000

## 2017-12-14 MED ORDER — LACTATED RINGERS IV SOLN
500.0000 mL | INTRAVENOUS | Status: DC | PRN
  Administered 2017-12-14: 500 mL via INTRAVENOUS

## 2017-12-14 MED ORDER — PHENYLEPHRINE 40 MCG/ML (10ML) SYRINGE FOR IV PUSH (FOR BLOOD PRESSURE SUPPORT)
80.0000 ug | PREFILLED_SYRINGE | INTRAVENOUS | Status: DC | PRN
  Filled 2017-12-14: qty 5

## 2017-12-14 NOTE — H&P (Signed)
Krystal [name] is a 32 y.o. female G72P2002  pt of [facility] presenting for IOL for Type 2 DM on 2 oral agents in pregnancy. She is on daily Subutex for hx of substance abuse.  She has hx of SVD with shoulder dystocia with first pregnancy, with 8lb 13oz baby, then scheduled primary C/S with second pregnancy for suspected LGA and infant was 9lb 8.7 oz.    EFW for this pregnancy was 3399 g, 7lb 8oz on 12/07/17.  Clinic  [facility] Prenatal Labs  Dating  [place] - ordered for viability scan Blood type:   A+  Genetic Screen 1 Screen:    AFP:   wnl  Quad:     NIPS: Antibody:   Anatomic [place]  12/21: incomplete but wnl Rubella:  non-immune  GTT Pre existing DM          Hba1c 5.6 RPR:     Flu vaccine  HBsAg:     TDaP vaccine                                              HIV:   non-reactive  Baby Food                                               GBS: negative  Contraception  Pap: negative 05/2015  Circumcision    Pediatrician  CF: negative  Support Person  SMA  Prenatal Classes  Hgb electrophoresis:     OB History    Gravida  3   Para  2   Term  2   Preterm      AB      Living  2     SAB      TAB      Ectopic      Multiple      Live Births  2          Past Medical History:  Diagnosis Date  . Left ACL tear 07/2015  . Medial meniscus tear 07/2015   left knee  . Non-insulin dependent type 2 diabetes mellitus (HCC)    oral  . Obesity   . Substance abuse (HCC)    percocet, currently on subutex   Past Surgical History:  Procedure Laterality Date  . ANTERIOR CRUCIATE LIGAMENT REPAIR Left 07/23/2015   Procedure: RECONSTRUCTION ANTERIOR CRUCIATE LIGAMENT (ACL) WITH GRAFTLINK HAMSTRING GRAFT;  Surgeon: Sheral Apley, MD;  Location: [facility];  Service: Orthopedics;  Laterality: Left;  . CESAREAN SECTION N/A 08/18/2013   Procedure: CESAREAN SECTION;  Surgeon: Reva Bores, MD;  Location: [facility];  Service: Obstetrics;  Laterality: N/A;  . EXCISION BONE CYST      gum  . KNEE ARTHROSCOPY WITH MEDIAL MENISECTOMY Left 07/23/2015   Procedure: LEFT KNEE ARTHROSCOPY WITH PARTIAL MEDIAL MENISCECTOMY ;  Surgeon: Sheral Apley, MD;  Location: [facility];  Service: Orthopedics;  Laterality: Left;  . KNEE ARTHROSCOPY WITH MENISCAL REPAIR Left 07/23/2015   Procedure: LATERAL MENISCAL REPAIR;  Surgeon: Sheral Apley, MD;  Location: [facility];  Service: Orthopedics;  Laterality: Left;   Family History: family history includes Alcohol abuse in her mother; Asthma in her mother; Diabetes in her mother; Stroke in  her mother. Social History:  reports that she has been smoking cigarettes.  She has a 5.00 pack-year smoking history. She has never used smokeless tobacco. She reports that she does not drink alcohol or use drugs.     Maternal Diabetes: Yes:  Diabetes Type:  Pre-pregnancy Genetic Screening: Normal Maternal Ultrasounds/Referrals: Normal Fetal Ultrasounds or other Referrals:  Fetal echo Maternal Substance Abuse:  No Significant Maternal Medications:  Meds include: Other:  Significant Maternal Lab Results:  Lab values include: Group B Strep negative Other Comments:  Subutex 8 mg TID  Review of Systems  Constitutional: Negative for chills, fever and malaise/fatigue.  Eyes: Negative for blurred vision.  Respiratory: Negative for cough and shortness of breath.   Cardiovascular: Negative for chest pain.  Gastrointestinal: Negative for heartburn and vomiting.  Genitourinary: Negative for dysuria, frequency and urgency.  Musculoskeletal: Negative.   Neurological: Negative for dizziness and headaches.  Psychiatric/Behavioral: Negative for depression.   Maternal Medical History:  Reason for admission: IOL for DM Type 2  Contractions: Frequency: irregular.   Perceived severity is mild.    Fetal activity: Perceived fetal activity is normal.   Last perceived fetal movement was within the past hour.    Prenatal  complications: Substance abuse.   Prenatal Complications - Diabetes: type 2. Diabetes is managed by oral agent (dual therapy).      Dilation: (P) 3.5(1 cm internal) Station: (P) -2 Exam by:: (P) sowder Blood pressure 122/75, pulse 92, temperature 98.3 F (36.8 C), temperature source Oral, resp. rate 18, height  (1.626 m), weight 279 lb 9.6 oz (126.8 kg), unknown if currently breastfeeding. Maternal Exam:  Uterine Assessment: Contraction strength is mild.  Contraction frequency is rare.   Abdomen: Fetal presentation: vertex  Cervix: Cervix evaluated by digital exam.     Fetal Exam Fetal Monitor Review: Mode: ultrasound.   Baseline rate: 135.  Variability: moderate (6-25 bpm).   Pattern: accelerations present and no decelerations.    Fetal State Assessment: Category I - tracings are normal.     Physical Exam  Nursing note and vitals reviewed. Constitutional: She is oriented to person, place, and time. She appears well-developed and well-nourished.  Neck: Normal range of motion.  Cardiovascular: Normal rate, regular rhythm and normal heart sounds.  Respiratory: Effort normal.  GI: Soft.  Musculoskeletal: Normal range of motion.  Neurological: She is alert and oriented to person, place, and time.  Skin: Skin is warm and dry.  Psychiatric: She has a normal mood and affect. Her behavior is normal. Judgment and thought content normal.    Prenatal labs: ABO, Rh: --/--/A POS (05/10 0753) Antibody: NEG (05/10 0753) Rubella: <0.90 (10/29 1112) RPR: Non Reactive (02/04 1356)  HBsAg: Negative (10/29 1112)  HIV: Non Reactive (02/04 1356)  GBS: Negative (05/03 0000)   Assessment/Plan: [id number]  by early [place] admitted for IOL for Type 2 DM on oral agents in pregnancy Hx substance abuse on Subutex Hx C/S x 1, elective for LGA, first pregnancy with SVD and shoulder dystocia  Admit to [facility] Foley [facility]b for cervical ripening Start Pitocin after FB is  out Continuous EFM Anticipate VBAC  Krystal [name] 12/14/2017, 9:02 AM

## 2017-12-14 NOTE — Anesthesia Pain Management Evaluation Note (Signed)
  CRNA Pain Management Visit Note  Patient: Krystal [name], 32 y.o., female  "Hello I am a member of the anesthesia team at [facility]. We have an anesthesia team available at all times to provide care throughout the hospital, including epidural management and anesthesia for C-section. I don't know your plan for the delivery whether it a natural birth, water birth, IV sedation, nitrous supplementation, doula or epidural, but we want to meet your pain goals."   1.Was your pain managed to your expectations on prior hospitalizations?   Yes   2.What is your expectation for pain management during this hospitalization?     Epidural  3.How can we help you reach that goal? Support prn  Record the patient's initial score and the patient's pain goal.   Pain: 7  Pain Goal: 8 [facility] wants you to be able to say your pain was always managed very well.  [facility] 12/14/2017

## 2017-12-14 NOTE — Progress Notes (Addendum)
Subjective: Doing well, pain controlled while sitting on ball for now.   Objective: BP (!) 104/53   Pulse 88   Temp 98.3 F (36.8 C) (Oral)   Resp 20   Ht  (1.626 m)   Wt 126.8 kg (279 lb 9.6 oz)   LMP  (LMP Unknown) Comment: had period about 3 months ago  BMI 47.99 kg/m  No intake/output data recorded. No intake/output data recorded.  FHT:  FHR: 130 bpm, variability: moderate,  accelerations:  Present,  decelerations:  Absent UC:   regular, every 3-4 minutes SVE:   Dilation: 5 Effacement (%): 50 Station: -3 Exam by:: leftwich-Kirby  Labs: Lab Results  Component Value Date   WBC 8.9 12/14/2017   HGB 13.0 12/14/2017   HCT 38.7 12/14/2017   MCV 90.6 12/14/2017   PLT 214 12/14/2017    Assessment / Plan: Induction of labor due to type II diabetes,  progressing well on pitocin. S/P FB.  Labor: Progressing on Pitocin, will continue to increase then AROM. S/P foley bulb. Preeclampsia:  none Fetal Wellbeing:  Category I Pain Control:  analgesia prn I/D:  n/a Anticipated MOD:  NSVD  Myrene Buddy 12/14/2017, 2:42 PM

## 2017-12-14 NOTE — Progress Notes (Signed)
Krystal [name] is a 32 y.o. G3P2002 at [redacted]w[redacted]d by ultrasound admitted for induction of labor due to Diabetes.  Subjective: Pt comfortable with epidural. Family in room for support.  Objective: BP 134/78   Pulse 74   Temp 98.3 F (36.8 C) (Oral)   Resp 18   Ht  (1.626 m)   Wt 279 lb 9.6 oz (126.8 kg)   LMP  (LMP Unknown) Comment: had period about 3 months ago  SpO2 100%   BMI 47.99 kg/m  No intake/output data recorded. No intake/output data recorded.  FHT:  FHR: 135 bpm, variability: moderate,  accelerations:  Present,  decelerations:  Present variables, sometimes unclear with intermittent tracing UC:   regular, every 2-3 minutes SVE:   Dilation: 5 Effacement (%): 50 Station: -3 Exam by:: leftwich-Kirby AROM with large amount clear fluid, IUPC and FSE applied for improved tracing. Pt tolerated well.   Labs: Lab Results  Component Value Date   WBC 8.9 12/14/2017   HGB 13.0 12/14/2017   HCT 38.7 12/14/2017   MCV 90.6 12/14/2017   PLT 214 12/14/2017    Assessment / Plan: Induction of labor due to diabetes,  progressing well on pitocin  Labor: Unchanged since FB out, unclear adequacy of contractions with external monitor. Will adjust pitocin for adequacy of contractions now that internal monitors applied. Preeclampsia:  n/a Fetal Wellbeing:  Category II Pain Control:  Epidural I/D:  GBS neg Anticipated MOD:  VBAC  Nasiir Monts Leftwich-Kirby 12/14/2017, 5:46 PM

## 2017-12-14 NOTE — Anesthesia Procedure Notes (Signed)
Epidural Patient location during procedure: OB  Staffing Anesthesiologist: Briscoe Daniello, MD Performed: anesthesiologist   Preanesthetic Checklist Completed: patient identified, site marked, surgical consent, pre-op evaluation, timeout performed, IV checked, risks and benefits discussed and monitors and equipment checked  Epidural Patient position: sitting Prep: DuraPrep Patient monitoring: heart rate, continuous pulse ox and blood pressure Approach: right paramedian Location: L3-L4 Injection technique: LOR saline  Needle:  Needle type: Tuohy  Needle gauge: 17 G Needle length: 9 cm and 9 Needle insertion depth: 6 cm Catheter type: closed end flexible Catheter size: 20 Guage Catheter at skin depth: 10 cm Test dose: negative  Assessment Events: blood not aspirated, injection not painful, no injection resistance, negative IV test and no paresthesia  Additional Notes Patient identified. Risks/Benefits/Options discussed with patient including but not limited to bleeding, infection, nerve damage, paralysis, failed block, incomplete pain control, headache, blood pressure changes, nausea, vomiting, reactions to medication both or allergic, itching and postpartum back pain. Confirmed with bedside nurse the patient's most recent platelet count. Confirmed with patient that they are not currently taking any anticoagulation, have any bleeding history or any family history of bleeding disorders. Patient expressed understanding and wished to proceed. All questions were answered. Sterile technique was used throughout the entire procedure. Please see nursing notes for vital signs. Test dose was given through epidural needle and negative prior to continuing to dose epidural or start infusion. Warning signs of high block given to the patient including shortness of breath, tingling/numbness in hands, complete motor block, or any concerning symptoms with instructions to call for help. Patient was given  instructions on fall risk and not to get out of bed. All questions and concerns addressed with instructions to call with any issues.     

## 2017-12-14 NOTE — Progress Notes (Signed)
Krystal [name] is a 32 y.o. G3P2002 at [redacted]w[redacted]d by 5 week ultrasound admitted for induction of labor due to DM Type 2 in pregnancy, on subutex for hx substance abuse, TOLAC with one prior SVD.  Subjective: Pt comfortable, feeling normal fetal movement.  Objective: BP 122/75   Pulse 92   Temp 98.3 F (36.8 C) (Oral)   Resp 18   Ht  (1.626 m)   Wt 279 lb 9.6 oz (126.8 kg)   LMP  (LMP Unknown) Comment: had period about 3 months ago  BMI 47.99 kg/m  No intake/output data recorded. No intake/output data recorded.  FHT:  FHR: 135 bpm, variability: moderate,  accelerations:  Present,  decelerations:  Absent UC:   rare SVE:    1.5/thick/posterior, vertex  FB placed without difficulty and filled to 60 cc by RN.  Pt tolerated well.  Labs: Lab Results  Component Value Date   WBC 8.9 12/14/2017   HGB 13.0 12/14/2017   HCT 38.7 12/14/2017   MCV 90.6 12/14/2017   PLT 214 12/14/2017    Assessment / Plan: Induction of labor due to preexisting DM   Labor: Progressing normally Preeclampsia:  n/a Fetal Wellbeing:  Category I Pain Control:  Labor support without medications I/D:  GBS neg Anticipated MOD:  VBAC  Shaleah Nissley Leftwich-Kirby 12/14/2017, 8:57 AM

## 2017-12-14 NOTE — Anesthesia Preprocedure Evaluation (Signed)
Anesthesia Evaluation  Patient identified by MRN, date of birth, ID band Patient awake    Reviewed: Allergy & Precautions, NPO status , Patient's Chart, lab work & pertinent test results  Airway Mallampati: II  TM Distance: >3 FB Neck ROM: Full    Dental no notable dental hx.    Pulmonary Current Smoker,    Pulmonary exam normal breath sounds clear to auscultation       Cardiovascular negative cardio ROS Normal cardiovascular exam Rhythm:Regular Rate:Normal     Neuro/Psych negative neurological ROS  negative psych ROS   GI/Hepatic negative GI ROS, (+)     substance abuse (on Subutex)  ,   Endo/Other  diabetes, Type obesity  Renal/GU negative Renal ROS  negative genitourinary   Musculoskeletal negative musculoskeletal ROS (+)   Abdominal   Peds negative pediatric ROS (+)  Hematology negative hematology ROS (+)   Anesthesia Other Findings   Reproductive/Obstetrics (+) Pregnancy                             Anesthesia Physical Anesthesia Plan  ASA: III  Anesthesia Plan: Epidural   Post-op Pain Management:    Induction:   PONV Risk Score and Plan:   Airway Management Planned:   Additional Equipment:   Intra-op Plan:   Post-operative Plan:   Informed Consent: I have reviewed the patients History and Physical, chart, labs and discussed the procedure including the risks, benefits and alternatives for the proposed anesthesia with the patient or authorized representative who has indicated his/her understanding and acceptance.     Plan Discussed with:   Anesthesia Plan Comments:         Anesthesia Quick Evaluation

## 2017-12-15 ENCOUNTER — Encounter (HOSPITAL_COMMUNITY): Payer: Self-pay

## 2017-12-15 ENCOUNTER — Other Ambulatory Visit: Payer: Self-pay

## 2017-12-15 DIAGNOSIS — Z3A39 39 weeks gestation of pregnancy: Secondary | ICD-10-CM

## 2017-12-15 DIAGNOSIS — O2412 Pre-existing diabetes mellitus, type 2, in childbirth: Secondary | ICD-10-CM

## 2017-12-15 LAB — GLUCOSE, CAPILLARY
Glucose-Capillary: 99 mg/dL (ref 65–99)
Glucose-Capillary: 97 mg/dL (ref 65–99)

## 2017-12-15 MED ORDER — IBUPROFEN 600 MG PO TABS
600.0000 mg | ORAL_TABLET | Freq: Four times a day (QID) | ORAL | Status: DC
Start: 2017-12-15 — End: 2017-12-17
  Administered 2017-12-15 – 2017-12-17 (×10): 600 mg via ORAL
  Filled 2017-12-15 (×10): qty 1

## 2017-12-15 MED ORDER — SENNOSIDES-DOCUSATE SODIUM 8.6-50 MG PO TABS
2.0000 | ORAL_TABLET | ORAL | Status: DC
  Administered 2017-12-16 (×2): 2 via ORAL
  Filled 2017-12-15 (×2): qty 2

## 2017-12-15 MED ORDER — ZOLPIDEM TARTRATE 5 MG PO TABS
5.0000 mg | ORAL_TABLET | Freq: Every evening | ORAL | Status: DC | PRN
Start: 2017-12-15 — End: 2017-12-17

## 2017-12-15 MED ORDER — MEASLES, MUMPS & RUBELLA VAC ~~LOC~~ INJ
0.5000 mL | INJECTION | Freq: Once | SUBCUTANEOUS | Status: AC
  Administered 2017-12-17: 0.5 mL via SUBCUTANEOUS
  Filled 2017-12-15 (×2): qty 0.5

## 2017-12-15 MED ORDER — COCONUT OIL OIL
1.0000 "application " | TOPICAL_OIL | Status: DC | PRN
  Filled 2017-12-15: qty 120

## 2017-12-15 MED ORDER — TETANUS-DIPHTH-ACELL PERTUSSIS 5-2.5-18.5 LF-MCG/0.5 IM SUSP: 0.5000 mL | Freq: Once | INTRAMUSCULAR | Status: DC

## 2017-12-15 MED ORDER — BENZOCAINE-MENTHOL 20-0.5 % EX AERO
1.0000 "application " | INHALATION_SPRAY | CUTANEOUS | Status: DC | PRN
  Administered 2017-12-15: 1 via TOPICAL
  Filled 2017-12-15 (×2): qty 56

## 2017-12-15 MED ORDER — PRENATAL MULTIVITAMIN CH
1.0000 | ORAL_TABLET | Freq: Every day | ORAL | Status: DC
  Administered 2017-12-15 – 2017-12-17 (×3): 1 via ORAL
  Filled 2017-12-15 (×3): qty 1

## 2017-12-15 MED ORDER — DIPHENHYDRAMINE HCL 25 MG PO CAPS: 25.0000 mg | ORAL_CAPSULE | Freq: Four times a day (QID) | ORAL | Status: DC | PRN

## 2017-12-15 MED ORDER — METFORMIN HCL 500 MG PO TABS
1000.0000 mg | ORAL_TABLET | Freq: Two times a day (BID) | ORAL | Status: DC
  Administered 2017-12-15 – 2017-12-17 (×5): 1000 mg via ORAL
  Filled 2017-12-15 (×7): qty 2

## 2017-12-15 MED ORDER — ONDANSETRON HCL 4 MG PO TABS
4.0000 mg | ORAL_TABLET | ORAL | Status: DC | PRN
Start: 2017-12-15 — End: 2017-12-17

## 2017-12-15 MED ORDER — ACETAMINOPHEN 325 MG PO TABS
650.0000 mg | ORAL_TABLET | ORAL | Status: DC | PRN
  Administered 2017-12-15: 650 mg via ORAL
  Filled 2017-12-15 (×2): qty 2

## 2017-12-15 MED ORDER — WITCH HAZEL-GLYCERIN EX PADS: 1.0000 "application " | MEDICATED_PAD | CUTANEOUS | Status: DC | PRN

## 2017-12-15 MED ORDER — ONDANSETRON HCL 4 MG/2ML IJ SOLN: 4.0000 mg | INTRAMUSCULAR | Status: DC | PRN

## 2017-12-15 MED ORDER — SIMETHICONE 80 MG PO CHEW: 80.0000 mg | CHEWABLE_TABLET | ORAL | Status: DC | PRN

## 2017-12-15 MED ORDER — DIBUCAINE 1 % RE OINT: 1.0000 "application " | TOPICAL_OINTMENT | RECTAL | Status: DC | PRN

## 2017-12-15 NOTE — Progress Notes (Signed)
CBG was 99 at 1300; patient states that she ate breakfast at 1000 and is not sure when she will eat lunch.  Informed patient to please call when food is on the way so that we can check her before she eats.

## 2017-12-15 NOTE — Anesthesia Postprocedure Evaluation (Signed)
Anesthesia Post Note  Patient: Krystal [name]  Procedure(s) Performed: AN AD HOC LABOR EPIDURAL     Patient location during evaluation: Mother Baby Anesthesia Type: Epidural Level of consciousness: awake and alert and oriented Pain management: satisfactory to patient Vital Signs Assessment: post-procedure vital signs reviewed and stable Respiratory status: spontaneous breathing and nonlabored ventilation Cardiovascular status: stable Postop Assessment: no headache, no backache, no signs of nausea or vomiting, adequate PO intake, patient able to bend at knees and no apparent nausea or vomiting (patient up walking) Anesthetic complications: no    Last Vitals:  Vitals:   12/15/17 0518 12/15/17 0616  BP: 130/78 114/61  Pulse: 98 96  Resp: 18 16  Temp: 37.2 C 37 C  SpO2: 98%     Last Pain:  Vitals:   12/15/17 0616  TempSrc: Oral  PainSc:    Pain Goal:                 Madison Hickman

## 2017-12-15 NOTE — Progress Notes (Signed)
Patient ID: Krystal [name], female   DOB: 03/20/1986, 32 y.o.   MRN: [id number]  Pt feeling more uncomfortable w/ ctx; just rec'd a redose at approx 0050 when I arrived to room  BP 122/76, other VSS FHR 120s, +10x10accels, no decels Ctx q 2-3 mins with Pit @ 54mu/min, MVUs 190+ Cx 7/90/-2  IUP@term  DM2 TOLAC Early active labor (cx just thinning for the first time)  Used side lying release on both sides x 3 ctx each; now positioned on R with peanut Keep ctx adequate, and recheck cx in 2 hrs or sooner w/ pressure Pt preferring vag del at this time, but she is frustrated with the length of time labor is taking- reassured  Cam Hai CNM 12/15/2017 1:39 AM

## 2017-12-16 LAB — GLUCOSE, CAPILLARY: GLUCOSE-CAPILLARY: 82 mg/dL (ref 65–99)

## 2017-12-16 NOTE — Progress Notes (Signed)
Patient stated she did not want to take blood sugar right now and she just ate 2 reese cups.   Tylene Fantasia, RN 12/16/2017 7:38 PM

## 2017-12-16 NOTE — Clinical Social Work Maternal (Signed)
CLINICAL SOCIAL WORK MATERNAL/CHILD NOTE  Patient Details  Name: Krystal [name] MRN: [id number] Date of Birth: 12/15/2015  Date:  12/16/2017  Clinical Social Worker Initiating Note:  Tesha Archambeau, MSW, LCSW-A Date/Time: Initiated:  12/16/17/1000     Child's Name:  Krystal [name]   Biological Parents:  Mother, Father   Need for Interpreter:  None   Reason for Referral:  Other (Comment)(MOB on subutex)   Address:  [address] [place] [address]    Phone number:  [phone] (home)     Additional phone number:   Household Members/Support Persons (HM/SP):       HM/SP Name Relationship DOB or Age  HM/SP -1        HM/SP -2        HM/SP -3        HM/SP -4        HM/SP -5        HM/SP -6        HM/SP -7        HM/SP -8          Natural Supports (not living in the home):  Friends, Extended Family, Immediate Family   Professional Supports: None   Employment: Part-time([employer])   Type of Work:  HVAC   Education:    High school graduate  Homebound arranged:  N/A  Financial Resources:  Medicaid   Other Resources:  WIC, Food Stamps    Cultural/Religious Considerations Which May Impact Care:  Patient is Catholic  Strengths:  Ability to meet basic needs , Pediatrician chosen, Home prepared for child , Understanding of illness   Psychotropic Medications:   None      Pediatrician:    [place] area  Pediatrician List:   [facility](Dr. Davis)  [place]    [place]    [place]    [place]    [place]      Pediatrician Fax Number:    Risk Factors/Current Problems:      Cognitive State:  Alert , Able to Concentrate    Mood/Affect:  Comfortable , Calm , Happy    CSW Assessment: CSW met with patient, newborn, and FOB at bedside. CSW obtained permission from patient to discuss reason for consult in front of FOB. The newborn's name is Krystal [name]. Patient reports having a crib and bassinet  for Krystal [name] to sleep, SIDS reduction techniques were thoroughly discussed. Patient reports having a new car seat for Krystal [name]. Patient reports having great family support. 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 Patient receives WIC and Food Stamps. Patient and FOB have five children combined between them. Patient reports having all basic necessities for infant in place at home. Patient and CSW discussed subutex, patient is taking 8mg of Subutex 3 times a day. Patient is seen for MAT at [facility] in [place]. Patient attends peer group sessions once weekly. Patient has been sober for three years, CSW congratulated patient on major success. CSW educated patient and FOB on hospital drug screening policies and notified parents that UDS was completed on patient and Krystal [name]. Both UDS were negative. Patient and CSW discussed Krystal [name] remaining at [facility] to be monitored for NAS, patient in agreement to plan. Patient did not have further questions for CSW. CSW encouraged patient to reach out for assistance if needs arise before or after discharge. Patient expressed gratitude for CSW interaction.  CSW Plan/Description:  No Further Intervention Required/No Barriers to   Discharge, CSW Will Continue to Monitor Umbilical Cord Tissue Drug Screen Results and Make Report if Warranted, Hospital Drug Screen Policy Information, Neonatal Abstinence Syndrome (NAS) Education, Sudden Infant Death Syndrome (SIDS) Education, Perinatal Mood and Anxiety Disorder (PMADs) Education    Mossie Gilder L Right[employer], LCSWA 12/16/2017, 10:02 AM  

## 2017-12-16 NOTE — Progress Notes (Signed)
RN requested that patient call for glucose check when food has been ordered or is on the way; patient has not called before any meals today.  Upon entering the room; patient nor support person is present.  Infant is being watched by a family friend; two of infant's siblings are lying in the bed.  There is a meal tray on the counter that has been eaten.  Requested for family friend to get patient to call RN when she returns to room.

## 2017-12-16 NOTE — Lactation Note (Signed)
This note was copied from a baby's chart. Lactation Consultation Note  Patient Name: Krystal [name] ZOXWR'U Date: 12/16/2017 Reason for consult: Initial assessment;Term  17 hours old female who is being exclusively BF by his mother she's a P3 and experienced BF she was able to BF her last baby for 7 months, but didn't BF her first one because he was tongue tied. Mom has history of substance abuse and subutex use according the MD note MOB desires to BF. Baby was crying and swaddled on mom's lap when entering the room, mom was falling asleep but asked for latch assistance because she voiced baby has not been fed since 5:15 pm, he's only had attempts.  Taught mom how to hand express and she was able to get two drops of colostrum out of her left breast. LC took baby STS in football position to her left breast but baby didn't latch, he kept crying, multiple attempts were made. Did some suck training but baby would just bite on gloved finger, he has a very tight grip. Mom also noted that baby was tongue tied. LC checked and saw there was a restriction but unable to tell if it is interfering with his ability to feed at the breast at this point, mom has a LATCH score of 8.  Mom has already started pumping, she had 5 ml of colostrum, LC spoon and finger fed baby, he was able to take the entire volume. Baby burped, LC swaddled baby and put him back into his bassinet. Mom is aware that she should be pumping every 3 hours; reviewed storage guidelines of breastmilk.  Encouraged mom to feed baby STS 8-12 times/24 hours or sooner if feeding cues are present and to call for latch assistance the next time he's ready to feed. Reviewed BF brochure, BF resources and feeding diary, mom is aware of LC services and will call PRN.  Maternal Data Formula Feeding for Exclusion: No Has patient been taught Hand Expression?: Yes Does the patient have breastfeeding experience prior to this delivery?: Yes     Interventions Interventions: Breast feeding basics reviewed;Assisted with latch;Skin to skin;Breast massage;Hand express;Breast compression;Adjust position;Support pillows;Position options;Expressed milk;DEBP  Lactation Tools Discussed/Used Tools: Pump Breast pump type: Double-Electric Breast Pump WIC Program: Yes Pump Review: Setup, frequency, and cleaning Initiated by:: RN Date initiated:: 12/15/17   Consult Status Consult Status: Follow-up Date: 12/16/17 Follow-up type: In-patient    Destyni Hoppel Venetia Constable 12/16/2017, 12:40 AM

## 2017-12-16 NOTE — Progress Notes (Signed)
POSTPARTUM PROGRESS NOTE  Post Partum Day 1  Subjective:  Krystal [name] is a 32 y.o. G3P3003 s/p SVD at [redacted]w[redacted]d.  She reports she is doing well. No acute events overnight. She denies any problems with ambulating, voiding or po intake. Denies nausea or vomiting.  Pain is well controlled.  Lochia is small.  Objective: Blood pressure 117/86, pulse 60, temperature 97.7 F (36.5 C), temperature source Oral, resp. rate 18, height  (1.626 m), weight 279 lb 9.6 oz (126.8 kg), SpO2 98 %, unknown if currently breastfeeding.  Physical Exam:  General: alert, cooperative and no distress Chest: no respiratory distress Heart:regular rate, distal pulses intact Abdomen: soft, nontender,  Uterine Fundus: firm, appropriately tender DVT Evaluation: No calf swelling or tenderness Extremities: trace edema Skin: warm, dry  Recent Labs    12/14/17 0743  HGB 13.0  HCT 38.7    Assessment/Plan: Krystal [name] is a 32 y.o. [id number] s/p SVD at [redacted]w[redacted]d   PPD#1 - Doing well  Routine postpartum care Contraception: interval BTL Feeding: breast Dispo: Plan for discharge tomorrow.   LOS: 2 days   Frederik Pear, MD 12/16/2017, 7:43 AM

## 2017-12-17 LAB — GLUCOSE, CAPILLARY: GLUCOSE-CAPILLARY: 99 mg/dL (ref 65–99)

## 2017-12-17 MED ORDER — IBUPROFEN 600 MG PO TABS: 600.0000 mg | ORAL_TABLET | Freq: Four times a day (QID) | ORAL | 0 refills | Status: DC

## 2017-12-17 MED ORDER — PNEUMOCOCCAL VAC POLYVALENT 25 MCG/0.5ML IJ INJ
0.5000 mL | INJECTION | Freq: Once | INTRAMUSCULAR | Status: AC
  Administered 2017-12-17: 0.5 mL via INTRAMUSCULAR
  Filled 2017-12-17: qty 0.5

## 2017-12-17 NOTE — Discharge Instructions (Signed)

## 2017-12-17 NOTE — Lactation Note (Signed)
This note was copied from a baby's chart. Lactation Consultation Note  Patient Name: Krystal [name] ZOXWR'U Date: 12/17/2017  Mom states baby is latching and she is supplementing with formula.  Mom is not pumping consistently.  Instructed to post pump every 3 hours to stimulate milk supply.  She states she is comfortable using the pump.  Questions answered.  Encouraged to call for assist prn.   Maternal Data    Feeding Feeding Type: Breast Fed Length of feed: 30 min  LATCH Score                   Interventions    Lactation Tools Discussed/Used     Consult Status      [name] 12/17/2017, 12:06 PM

## 2017-12-17 NOTE — Lactation Note (Signed)
This note was copied from a baby's chart. Lactation Consultation Note  Patient Name: Boy Nevae Pinnix ZOXWR'U Date: 12/17/2017     [facility] Follow Up Visit:  Experienced breastfeeding mother who states that baby is latching and sucking well and she has no questions/concerns at this time.  She was originally using a NS because she states the baby is tongue tied, but, was happy to tell me that she has now gotten him latched without it.  She does not want assistance and does not need me to assess breasts.  She states her nipples are sore but no breakdown.  Comfort gels provided with instructions for use.  Mother will call for assistance as needed.                Deyani Hegarty R Koral Thaden 12/17/2017, 5:33 AM

## 2017-12-17 NOTE — Discharge Summary (Signed)
OB Discharge Summary     Patient Name: Krystal [name] DOB: 09-13-1985 MRN: [id number]  Date of admission: 12/14/2017 Delivering MD: Serita Grammes D   Date of discharge: 12/17/2017  Admitting diagnosis: INDUCTION Intrauterine pregnancy: [redacted]w[redacted]d    Secondary diagnosis:  Active Problems:   Encounter for planned induction of labor  Additional problems:  Patient Active Problem List   Diagnosis Date Noted  . Encounter for planned induction of labor 12/14/2017  . Diabetes in pregnancy 10/19/2017  . Rubella non-immune status, antepartum 06/05/2017  . Supervision of high risk pregnancy, antepartum 06/04/2017  . H/O: substance abuse 06/04/2017  . Healthcare maintenance 05/07/2015  . Bilateral knee pain 10/08/2014  . Acne 05/14/2014  . Alopecia 05/12/2014  . S/P C-section 08/18/2013  . GERD (gastroesophageal reflux disease) 08/30/2012  . HIDRADENITIS SUPPURATIVA 03/17/2009  . ECZEMA, ATOPIC 08/26/2007  . DEPRESSION, MILD 05/08/2007  . Diabetes mellitus ([place] 10/04/2006  . OBESITY, NOS 10/04/2006  . TOBACCO DEPENDENCE 10/04/2006  . MIGRAINE, UNSPEC., W/O INTRACTABLE MIGRAINE 10/04/2006       Discharge diagnosis: Term Pregnancy Delivered and Type 2 DM                                                                                                Post partum procedures:MMR  Augmentation: AROM, Pitocin and Foley Balloon  Complications: None  Hospital course:  Induction of Labor With Vaginal Delivery   32y.o. yo G3P3003 at 335w1das admitted to the hospital 12/14/2017 for induction of labor.  Indication for induction: TYPE 2 DM.  Patient had an uncomplicated labor course as follows: Membrane Rupture Time/Date: 5:30 PM ,12/14/2017   Intrapartum Procedures: Episiotomy: None [1]                                         Lacerations:  None [1]  Patient had delivery of a Viable infant.  Information for the patient's newborn:  CeBriceida, Rasberry0[[id number]]Delivery Method:  VBAC   12/15/2017  Details of delivery can be found in separate delivery note.  Patient had a routine postpartum course. CBGs remained well controlled during hospitalization. She declined BTL immediately postpartum so will get interval BTL. Patient is discharged home 12/17/17.  Physical exam  Vitals:   12/15/17 1838 12/16/17 0522 12/16/17 1816 12/17/17 0546  BP: (!) 128/57 117/86 116/77 (!) 114/94  Pulse: 86 60 93 89  Resp:  _0 Temp: 98 F (36.7 C) 97.7 F (36.5 C) 98.1 F (36.7 C) 98.2 F (36.8 C)  TempSrc: Oral Oral Oral   SpO2:   98%   Weight:      Height:       General: alert, cooperative and no distress Lochia: appropriate Uterine Fundus: firm Incision: N/A DVT Evaluation: No evidence of DVT seen on physical exam. Labs: Lab Results  Component Value Date   WBC 8.9 12/14/2017   HGB 13.0 12/14/2017   HCT 38.7 12/14/2017   MCV 90.6 12/14/2017   PLT  214 12/14/2017   CMP Latest Ref Rng & Units 04/22/2017  Glucose 65 - 99 mg/dL 149(H)  BUN 6 - 20 mg/dL 13  Creatinine 0.44 - 1.00 mg/dL 0.45  Sodium 135 - 145 mmol/L 135  Potassium 3.5 - 5.1 mmol/L 4.3  Chloride 101 - 111 mmol/L 97(L)  CO2 22 - 32 mmol/L 30  Calcium 8.9 - 10.3 mg/dL 9.1  Total Protein 6.5 - 8.1 g/dL 7.0  Total Bilirubin 0.3 - 1.2 mg/dL 0.2(L)  Alkaline Phos 38 - 126 U/L 63  AST 15 - 41 U/L 19  ALT 14 - 54 U/L 19    Discharge instruction: per After Visit Summary and "Baby and Me Booklet".  After visit meds:  Allergies as of 12/17/2017   No Known Allergies     Medication List    TAKE these medications   accu-chek soft touch lancets Use as instructed   acetaminophen 500 MG tablet Commonly known as:  TYLENOL Take 1,000 mg by mouth every 6 (six) hours as needed for moderate pain.   Blood Glucose Monitor System w/Device Kit Use with Accu-check lancets and strips to test blood sugar daily Check before eating in the morning and 2 hours after breakfast, 2 hours after lunch, and 2 hours  after dinner   buprenorphine 8 MG Subl SL tablet Commonly known as:  SUBUTEX DISSOLVE two AND one half tablets UNDER THE TONGUE daily AS DIRECTED   fluticasone 50 MCG/ACT nasal spray Commonly known as:  FLONASE Place 2 sprays into both nostrils daily.   glucose blood test strip Commonly known as:  ACCU-CHEK ACTIVE STRIPS Use as instructed   glyBURIDE 2.5 MG tablet Commonly known as:  DIABETA Take 1 tablet (2.5 mg total) by mouth 2 (two) times daily with a meal.   ibuprofen 600 MG tablet Commonly known as:  ADVIL,MOTRIN Take 1 tablet (600 mg total) by mouth every 6 (six) hours.   metFORMIN 1000 MG tablet Commonly known as:  GLUCOPHAGE Take 1 tablet (1,000 mg total) by mouth 2 (two) times daily with a meal.   pantoprazole 20 MG tablet Commonly known as:  PROTONIX Take 1 tablet (20 mg total) by mouth daily.   PRENATAL 27-1 MG Tabs Take 1 tablet by mouth daily.       Diet: carb modified diet  Activity: Advance as tolerated. Pelvic rest for 6 weeks.   Outpatient follow up:2 weeks Follow up Appt:No future appointments. Follow up Visit: Follow-up Information    [facility] Schedule an appointment as soon as possible for a visit.   Specialty:  Obstetrics and Gynecology Why:  For postpartum visit Contact information: [address] [place] [address] [phone]          Postpartum contraception: Tubal Ligation  Newborn Data: Live born female  Birth Weight: 7 lb 10.6 oz (3476 g) APGAR: 5, 9  Newborn Delivery   Birth date/time:  12/15/2017 02:47:00 Delivery type:  VBAC, Spontaneous     Baby Feeding: Bottle and Breast Disposition:rooming in   12/17/2017  , DO   

## 2017-12-18 ENCOUNTER — Ambulatory Visit: Payer: Self-pay

## 2017-12-18 NOTE — Lactation Note (Signed)
This note was copied from a baby's chart. Lactation Consultation Note  Patient Name: Krystal [name] ZOXWR'U Date: 12/18/2017 Reason for consult: Follow-up assessment  RN requested LC assessment at the breast.  Mom using scissor hold and baby latching on fairly deep.  Baby swallowing, and slurping as loss of suction identified.  Initiated a nipple shield (20 mm) with instructions on use, to assist with attaining a better seal on breast.  Milk noted in shield.  Reviewed using good support during feedings.  Mom needing guidance to use good head support during feeding, rolled up blanket placed. Encouraged alternate breast compression during feeding.  Baby can attain a deep latch, but with less support to breast, and letting baby's head fall away from the breast, baby sucks on nipple.  Reviewed to Mom importance of baby latched onto breast, not nipple.  Mom to post breastfeed pump, and offer expressed breast milk back to baby.  Consult Status Consult Status: Follow-up Date: 12/19/17 Follow-up type: In-patient    Judee Clara 12/18/2017, 12:38 PM

## 2017-12-18 NOTE — Lactation Note (Addendum)
This note was copied from a baby's chart. Lactation Consultation Note  Patient Name: Boy Divya Munshi GNFAO'Z Date: 12/18/2017 Reason for consult: Follow-up assessment;Hyperbilirubinemia;Infant weight loss   Baby 79 hrs old and at 8%, under single phototherapy. Mom has been consistently double pumping, obtaining 60 ml now per pumping.  Baby breastfeeds for 20 mins and then Mom pumps and offers EBM by bottle. Baby has a lip and anterior tongue tie.  Mom asked for Tongue Tie resources, as she plans to get baby evaluated. Mom declined needing any assistance. Referral faxed to [facility] for pump on discharge.   Consult Status Consult Status: Follow-up Date: 12/19/17 Follow-up type: In-patient    Judee Clara 12/18/2017, 10:23 AM

## 2017-12-19 ENCOUNTER — Ambulatory Visit: Payer: Self-pay

## 2017-12-19 NOTE — Lactation Note (Signed)
This note was copied from a baby's chart. Lactation Consultation Note  Patient Name: Krystal [name] ZOXWR'U Date: 12/19/2017 Reason for consult: Follow-up assessment;Difficult latch;Other (Comment)(post frenotomy)  Baby had frenotomy this am.  Baby rooting after a sleepy period after procedure.   Mom positioned baby in football hold on right breast. When sandwiching breast, and expressing some milk from nipple, baby latched on deeply after a couple attempts.  Mom needing some guidance with not pulling breast away from his nose.  Tilted baby's jaw in closer and reassured Mom that baby was positioned well.  Mom stated the latch felt more comfortable, and no clicking or loss of suction audible.  Encouraged Mom to use alternate breast compression to increase milk transfer.  Multiple swallows heard.  Baby fed 10 mins on each breast.  Mom states her breasts feel lighter and softer post feeding.  Plan- 1- Breastfeed baby on cue, STS.  Ask for help making sure baby is latched deeply onto breast. 2- After breastfeeding on both breasts, pump both breasts after every other feeding, for 15-20 mins 3- Offer EBM 30 ml after breastfeeding.  Will do a weighted feeding this afternoon to assess milk transfer.   Mom has a DEBP at home.     Feeding Feeding Type: Breast Fed Length of feed: 20 min  LATCH Score Latch: Grasps breast easily, tongue down, lips flanged, rhythmical sucking.  Audible Swallowing: Spontaneous and intermittent  Type of Nipple: Everted at rest and after stimulation  Comfort (Breast/Nipple): Soft / non-tender  Hold (Positioning): Assistance needed to correctly position infant at breast and maintain latch.  LATCH Score: 9  Interventions Interventions: Breast feeding basics reviewed;Assisted with latch;Skin to skin;Breast massage;Hand express;Expressed milk;Position options;Support pillows;Adjust position;Breast compression;DEBP   Consult Status Consult Status:  Follow-up Date: 12/20/17 Follow-up type: In-patient    Krystal [name] 12/19/2017, 11:49 AM

## 2017-12-20 ENCOUNTER — Ambulatory Visit: Payer: Self-pay

## 2017-12-20 NOTE — Lactation Note (Signed)
This note was copied from a baby's chart. Lactation Consultation Note  Patient Name: Boy Shadiyah Wernli ZOXWR'U Date: 12/20/2017  Mom states feedings at breast have improved.  Baby latching without difficulty.  Milk is in and mom also pumping.  She obtained a pump from [facility]. Discussed lactation outpatient resources and encouraged prn.   Maternal Data    Feeding    LATCH Score                   Interventions    Lactation Tools Discussed/Used     Consult Status      [name] 12/20/2017, 10:29 AM

## 2017-12-21 ENCOUNTER — Encounter: Payer: Self-pay | Admitting: Obstetrics & Gynecology

## 2017-12-21 ENCOUNTER — Other Ambulatory Visit: Payer: Self-pay

## 2017-12-24 ENCOUNTER — Telehealth: Payer: Self-pay | Admitting: *Deleted

## 2017-12-24 NOTE — Telephone Encounter (Signed)
Pt called office and stated concerns regarding swollen legs and itching. She stated that she had the problem of the leg swelling while in the hospital. The problem had temporarily gotten better but now has gotten worse. Per chart review, pt had vaginal delivery of baby on 5/11. She reports that no new medications were started during or after delivery. She did have allergic reaction to tape on her leg and back. I advised pt to find out from the doctor that prescribes her Subutex if she can take Benadryl. If permitted, she may try that for the itching. She should increase po water intake and let us know if the swelling becomes worse. Pt has office visit scheduled on 5/29. Pt voiced understanding.

## 2017-12-27 ENCOUNTER — Other Ambulatory Visit: Payer: Self-pay | Admitting: Obstetrics and Gynecology

## 2017-12-27 DIAGNOSIS — E118 Type 2 diabetes mellitus with unspecified complications: Secondary | ICD-10-CM

## 2018-01-02 ENCOUNTER — Ambulatory Visit: Payer: Self-pay | Admitting: Obstetrics & Gynecology

## 2018-01-21 ENCOUNTER — Ambulatory Visit: Payer: Self-pay | Admitting: Obstetrics & Gynecology

## 2018-08-06 ENCOUNTER — Other Ambulatory Visit: Payer: Self-pay | Admitting: Family Medicine

## 2018-08-06 DIAGNOSIS — E118 Type 2 diabetes mellitus with unspecified complications: Secondary | ICD-10-CM

## 2018-08-14 ENCOUNTER — Other Ambulatory Visit: Payer: Self-pay | Admitting: Family Medicine

## 2018-08-14 DIAGNOSIS — E118 Type 2 diabetes mellitus with unspecified complications: Secondary | ICD-10-CM

## 2018-09-17 ENCOUNTER — Ambulatory Visit: Payer: Medicaid Other

## 2018-12-03 ENCOUNTER — Ambulatory Visit (INDEPENDENT_AMBULATORY_CARE_PROVIDER_SITE_OTHER): Payer: Medicaid Other | Admitting: Family Medicine

## 2018-12-03 ENCOUNTER — Other Ambulatory Visit: Payer: Self-pay

## 2018-12-03 VITALS — BP 110/74 | HR 82

## 2018-12-03 DIAGNOSIS — F331 Major depressive disorder, recurrent, moderate: Secondary | ICD-10-CM | POA: Diagnosis not present

## 2018-12-03 DIAGNOSIS — R739 Hyperglycemia, unspecified: Secondary | ICD-10-CM

## 2018-12-03 DIAGNOSIS — IMO0001 Reserved for inherently not codable concepts without codable children: Secondary | ICD-10-CM

## 2018-12-03 DIAGNOSIS — E1165 Type 2 diabetes mellitus with hyperglycemia: Secondary | ICD-10-CM

## 2018-12-03 DIAGNOSIS — E119 Type 2 diabetes mellitus without complications: Secondary | ICD-10-CM | POA: Insufficient documentation

## 2018-12-03 LAB — POCT GLYCOSYLATED HEMOGLOBIN (HGB A1C): HbA1c, POC (controlled diabetic range): 10.7 % — AB (ref 0.0–7.0)

## 2018-12-03 MED ORDER — FLUOXETINE HCL 20 MG PO TABS: 20.0000 mg | ORAL_TABLET | Freq: Every day | ORAL | 0 refills | Status: DC

## 2018-12-03 MED ORDER — GLYBURIDE 2.5 MG PO TABS: 2.5000 mg | ORAL_TABLET | Freq: Two times a day (BID) | ORAL | 3 refills | Status: DC

## 2018-12-03 MED ORDER — METFORMIN HCL 1000 MG PO TABS: 1000.0000 mg | ORAL_TABLET | Freq: Two times a day (BID) | ORAL | 3 refills | Status: DC

## 2018-12-03 NOTE — Progress Notes (Signed)
Subjective   Patient ID: Krystal [name]    DOB: 05-23-1986, 33 y.o. female   MRN: [id number]  CC: "follow-up"  HPI: Krystal [name] is a 33 y.o. female who presents to clinic today for the following:  Diabetes: [name] is been a type 2 diabetic since age 5.  Her last A1c was 5.6 approximately 1 year ago while on metformin and glyburide.  She has been out of these medications for the last 4 months since getting out of prison beginning in September 2019.  Depression and anxiety: She currently sees her psychiatrist "Dr. Karie Schwalbe" and nurse practitioner at the wellness day program who give her Suboxone for a history of opioid misuse disorder.  Patient reports taking another person's Percocet after getting out of jail several months ago but has not taken illicit drug since.  She denies alcohol use.  She has a family history of depression on her mother side.  He denies suicidal or homicidal ideations, visual or auditory hallucinations.  She currently sees a therapist weekly and is interested in starting medications today.  ROS: see HPI for pertinent.  PMFSH: Reviewed. Smoking status reviewed. Medications reviewed.  Objective   BP 110/74   Pulse 82   LMP 11/08/2018   SpO2 97%  Vitals and nursing note reviewed.  General: well nourished, well developed, NAD with non-toxic appearance HEENT: normocephalic, atraumatic, moist mucous membranes Cardiovascular: regular rate and rhythm without murmurs, rubs, or gallops Lungs: clear to auscultation bilaterally with normal work of breathing Abdomen: soft, non-tender, non-distended, normoactive bowel sounds Extremities: warm and well perfused, normal tone, no edema Psych: euthymic mood, congruent affect  Assessment & Plan   Uncontrolled type 2 diabetes mellitus without complication, without long-term current use of insulin (HCC) Poorly controlled as expected.  Primarily due to nonadherence due to expired medications.  No known complications  at this time.  Will likely need additional medications in addition to her metformin and glyburide. - Given refill for metformin 1000 mg twice daily and glyburide 2.5 mg twice daily - Checking annual diabetic labs including CBC, CMET, TSH, lipid panel, microalbumin creatinine ratio  Moderate episode of recurrent major depressive disorder (HCC) Uncontrolled with combined anxiety.  PHQ 9 score 17, very difficult.  Gad 7 score 12, very difficult.  Has a history of substance use.  Currently followed by psychiatry.  Not a threat to self or others. - Initiating Prozac 20 mg daily plans to follow-up 2-4 weeks - Reviewed risks and side effects  Orders Placed This Encounter  Procedures  . CBC  . Comprehensive metabolic panel    Order Specific Question:   Has the patient fasted?    Answer:   No  . TSH  . Lipid Panel    Order Specific Question:   Has the patient fasted?    Answer:   No  . Microalbumin/Creatinine Ratio, Urine  . HgB A1c   Meds ordered this encounter  Medications  . FLUoxetine (PROZAC) 20 MG tablet    Sig: Take 1 tablet (20 mg total) by mouth daily.    Dispense:  30 tablet    Refill:  0  . metFORMIN (GLUCOPHAGE) 1000 MG tablet    Sig: Take 1 tablet (1,000 mg total) by mouth 2 (two) times daily with a meal.    Dispense:  60 tablet    Refill:  3  . glyBURIDE (DIABETA) 2.5 MG tablet    Sig: Take 1 tablet (2.5 mg total) by mouth 2 (two) times daily  with a meal.    Dispense:  60 tablet    Refill:  3    Durward Parcelavid McMullen, DO [facility], PGY-3 12/03/2018, 4:13 PM

## 2018-12-03 NOTE — Assessment & Plan Note (Addendum)
Uncontrolled with combined anxiety.  PHQ 9 score 17, very difficult.  Gad 7 score 12, very difficult.  Has a history of substance use.  Currently followed by psychiatry.  Not a threat to self or others. - Initiating Prozac 20 mg daily plans to follow-up 2-4 weeks - Reviewed risks and side effects

## 2018-12-03 NOTE — Patient Instructions (Signed)
Thank you for coming in to see [place] today. Please see below to review our plan for today's visit.  1.  We will start you on Prozac 20 mg daily for your depression and anxiety.  This medication usually begins to work around 6 to 8 weeks, however this can be increased.   2.  Your diabetes is poorly controlled as we expected.  I called in a refill for your metformin and glyburide.  There is a possibility that you will need additional medication.  Remember that reducing your carbohydrate intake and exercising is key to prevention of insulin use. 3. Please follow-up in 2-4 weeks for reassessment.  Please call the clinic at [phone] if your symptoms worsen or you have any concerns. It was our pleasure to serve you.  Durward Parcel, DO [facility], PGY-3

## 2018-12-03 NOTE — Assessment & Plan Note (Signed)
Poorly controlled as expected.  Primarily due to nonadherence due to expired medications.  No known complications at this time.  Will likely need additional medications in addition to her metformin and glyburide. - Given refill for metformin 1000 mg twice daily and glyburide 2.5 mg twice daily - Checking annual diabetic labs including CBC, CMET, TSH, lipid panel, microalbumin creatinine ratio

## 2018-12-04 ENCOUNTER — Telehealth: Payer: Self-pay | Admitting: *Deleted

## 2018-12-04 LAB — COMPREHENSIVE METABOLIC PANEL
ALT: 22 IU/L (ref 0–32)
AST: 17 IU/L (ref 0–40)
Albumin/Globulin Ratio: 1.6 (ref 1.2–2.2)
Albumin: 4.1 g/dL (ref 3.9–5.0)
Alkaline Phosphatase: 86 IU/L (ref 39–117)
BUN/Creatinine Ratio: 18 (ref 9–23)
BUN: 11 mg/dL (ref 6–20)
Bilirubin Total: 0.2 mg/dL (ref 0.0–1.2)
CO2: 26 mmol/L (ref 20–29)
Calcium: 9.2 mg/dL (ref 8.7–10.2)
Chloride: 97 mmol/L (ref 96–106)
Creatinine, Ser: 0.6 mg/dL (ref 0.57–1.00)
GFR calc Af Amer: 141 mL/min/{1.73_m2} (ref >59)
GFR calc non Af Amer: 123 mL/min/{1.73_m2} (ref >59)
Globulin, Total: 2.5 g/dL (ref 1.5–4.5)
Glucose: 294 mg/dL — ABNORMAL HIGH (ref 65–99)
Potassium: 5 mmol/L (ref 3.5–5.2)
Sodium: 136 mmol/L (ref 134–144)
Total Protein: 6.6 g/dL (ref 6.0–8.5)

## 2018-12-04 LAB — CBC
Hematocrit: 44 % (ref 34.0–46.6)
Hemoglobin: 14.9 g/dL (ref 11.1–15.9)
MCH: 29.3 pg (ref 26.6–33.0)
MCHC: 33.9 g/dL (ref 31.5–35.7)
MCV: 87 fL (ref 79–97)
Platelets: 264 10*3/uL (ref 150–450)
RBC: 5.08 x10E6/uL (ref 3.77–5.28)
RDW: 12 % (ref 11.7–15.4)
WBC: 8.5 10*3/uL (ref 3.4–10.8)

## 2018-12-04 LAB — MICROALBUMIN / CREATININE URINE RATIO
Creatinine, Urine: 52.2 mg/dL
Microalb/Creat Ratio: <6 mg/g creat (ref 0–29)
Microalbumin, Urine: <3 ug/mL

## 2018-12-04 LAB — LIPID PANEL
Chol/HDL Ratio: 2.7 ratio (ref 0.0–4.4)
Cholesterol, Total: 115 mg/dL (ref 100–199)
HDL: 42 mg/dL (ref >39)
LDL Calculated: 60 mg/dL (ref 0–99)
Triglycerides: 64 mg/dL (ref 0–149)
VLDL Cholesterol Cal: 13 mg/dL (ref 5–40)

## 2018-12-04 LAB — TSH: TSH: 0.559 u[IU]/mL (ref 0.450–4.500)

## 2018-12-04 NOTE — Telephone Encounter (Signed)
Pt was seen yesterday but forgot to mention her yeast infection to MD.  She thinks she has this because of the white discharge and itchiness.  She wants to know if she can have a script without being seen again. Will forward to Dr. Abelardo Diesel. Jone Baseman, CMA

## 2018-12-05 ENCOUNTER — Encounter: Payer: Self-pay | Admitting: Family Medicine

## 2018-12-05 ENCOUNTER — Other Ambulatory Visit: Payer: Self-pay | Admitting: Family Medicine

## 2018-12-05 MED ORDER — FLUCONAZOLE 150 MG PO TABS: 150.0000 mg | ORAL_TABLET | Freq: Once | ORAL | 0 refills | Status: AC

## 2018-12-05 NOTE — Telephone Encounter (Signed)
Pt informed

## 2018-12-05 NOTE — Telephone Encounter (Signed)
Diflucan sent to [facility] pharmacy. Please advise. She needs to f/u if symptoms do not resolve within 7 days.  Durward Parcel, DO [facility], PGY-3

## 2018-12-18 ENCOUNTER — Telehealth (INDEPENDENT_AMBULATORY_CARE_PROVIDER_SITE_OTHER): Payer: Medicaid Other | Admitting: Family Medicine

## 2018-12-18 ENCOUNTER — Other Ambulatory Visit: Payer: Self-pay

## 2018-12-18 DIAGNOSIS — E118 Type 2 diabetes mellitus with unspecified complications: Secondary | ICD-10-CM

## 2018-12-18 MED ORDER — ACCU-CHEK AVIVA PLUS W/DEVICE KIT: PACK | 0 refills | Status: DC

## 2018-12-18 MED ORDER — GLUCOSE BLOOD VI STRP: ORAL_STRIP | 12 refills | Status: DC

## 2018-12-18 MED ORDER — ACCU-CHEK SOFTCLIX LANCETS MISC: 12 refills | Status: DC

## 2018-12-18 NOTE — Progress Notes (Signed)
BP- N/A T- N/A WT- N/A

## 2018-12-18 NOTE — Assessment & Plan Note (Signed)
Patient with recent poorly controlled type 2 diabetes with A1c of 10.7 in the setting of nonadherence.  Discussed office visit.  Ideally went to metformin and glyburide as prescribed.  Discussed lab results which were all normal.  No signs of proteinuria or kidney disease.  Patient is due for A1c at the end of July but would likely recheck at the end of June to reassess need for med optimization.  We will send glucometer to pharmacy.  She will be checking her fasting blood glucose with a goal of 120-130.  She will continue to work on diet and exercise.

## 2018-12-18 NOTE — Progress Notes (Addendum)
[facility] Telemedicine Visit  Patient consented to have virtual visit. Method of visit: Video  Encounter participants: Patient: Krystal [name] - located at Home Provider: Lovena Neighbours - located at [facility] Others (if applicable): None  Chief Complaint: Type 2 diabetes follow-up  HPI: Patient is a 33 year old female with past medical history significant for type 2 diabetes who is coming in today to follow-up on type 2 diabetes management.  Patient was recently seen in clinic on 12/03/2018.  A1c at that time was 10.7, significantly increased from previous one.  Patient reports that A1c worsened due to nonadherence to metformin for many months.  Since last office visit patient reports that she has been taking metformin 1000 mg 3 times a day as well as glyburide 2.5 mg 2 times a day.  She denies any polyuria polydipsia.  She continue to work on modifying her diet but has been unable to exercise due to COVID-19.  She is otherwise has no other acute complaints today.  She denies any chest pain, shortness of breath, abdominal pain, nausea, vomiting, fever, chills.  ROS: per HPI  Pertinent PMHx: Type 2 diabetes  Exam:  Cardiac: No palpitation Respiratory: No increased work of breathing Abdominal: No abdominal pain Neuro: Alert and oriented x4 difficult deficit  Assessment/Plan:  Uncontrolled type 2 diabetes mellitus without complication, without long-term current use of insulin (HCC) Patient with recent poorly controlled type 2 diabetes with A1c of 10.7 in the setting of nonadherence.  Discussed office visit.  Ideally went to metformin and glyburide as prescribed.  Discussed lab results which were all normal.  No signs of proteinuria or kidney disease.  Patient is due for A1c at the end of July but would likely recheck at the end of June to reassess need for med optimization.  We will send glucometer to pharmacy.  She will be checking her fasting blood glucose with a  goal of 120-130.  She will continue to work on diet and exercise.    Time spent during visit with patient: 6 minutes

## 2019-01-23 ENCOUNTER — Ambulatory Visit (INDEPENDENT_AMBULATORY_CARE_PROVIDER_SITE_OTHER): Payer: Medicaid Other | Admitting: Family Medicine

## 2019-01-23 ENCOUNTER — Encounter: Payer: Self-pay | Admitting: Family Medicine

## 2019-01-23 ENCOUNTER — Other Ambulatory Visit: Payer: Self-pay

## 2019-01-23 VITALS — BP 102/70 | HR 86 | Ht 64.0 in | Wt 254.5 lb

## 2019-01-23 DIAGNOSIS — F331 Major depressive disorder, recurrent, moderate: Secondary | ICD-10-CM

## 2019-01-23 DIAGNOSIS — L089 Local infection of the skin and subcutaneous tissue, unspecified: Secondary | ICD-10-CM | POA: Diagnosis not present

## 2019-01-23 MED ORDER — FLUOXETINE HCL 20 MG PO TABS: 20.0000 mg | ORAL_TABLET | Freq: Every day | ORAL | 1 refills | Status: DC

## 2019-01-23 MED ORDER — FLUCONAZOLE 150 MG PO TABS: 150.0000 mg | ORAL_TABLET | Freq: Once | ORAL | 0 refills | Status: AC

## 2019-01-23 MED ORDER — CEPHALEXIN 500 MG PO CAPS: 500.0000 mg | ORAL_CAPSULE | Freq: Three times a day (TID) | ORAL | 0 refills | Status: AC

## 2019-01-23 NOTE — Assessment & Plan Note (Signed)
Patient presents today with small papular lesion on the back of her head. Lesion appears to be folliculitis given location and appearance on exam. Infection seem superficial minimal concern for abscess. No drainage or fluctuance. Given tenderness, erythema and duration of the lesion will treat with oral agent though patient is not exhibiting any signs of systemic infection. Low suspicion for MRSA.  --Will prescribe Keflex 500 mg tid for 5 days. --Will also prescribe Diflucan 150 mg after completion of her treatment. --Will follow up if symptoms do not improve.

## 2019-01-23 NOTE — Progress Notes (Signed)
Subjective:    Patient ID: Krystal [name], female    DOB: 1985/12/22, 33 y.o.   MRN: [id number]   CC: Nodule back of the head   HPI: Patient is a 33 yo female with a past medical history significant for T2DM who present today complaining of nodule on the back of her head. Patient  reports she usually has a rash with small bumps around the back of her neck when her head gets sweaty. Patient has had lesion for about a week. Patient reports she has been picking at this nodule and has seen some blood at times. Patient reports that it is tender to the touch but denies any drainage from it such as pus. Patient has not tried anything for it. She denies any fever, chills, nausea, vomiting, headaches, vision or hearing changes.  Smoking status reviewed   ROS: all other systems were reviewed and are negative other than in the HPI   Past Medical History:  Diagnosis Date  . Left ACL tear 07/2015  . Medial meniscus tear 07/2015   left knee  . Non-insulin dependent type 2 diabetes mellitus (HCC)    oral  . Obesity   . Substance abuse ([place])    percocet, currently on subutex    Past Surgical History:  Procedure Laterality Date  . ANTERIOR CRUCIATE LIGAMENT REPAIR Left 07/23/2015   Procedure: RECONSTRUCTION ANTERIOR CRUCIATE LIGAMENT (ACL) WITH GRAFTLINK HAMSTRING GRAFT;  Surgeon: Renette Butters, MD;  Location: [place];  Service: Orthopedics;  Laterality: Left;  . CESAREAN SECTION N/A 08/18/2013   Procedure: CESAREAN SECTION;  Surgeon: Donnamae Jude, MD;  Location: [facility];  Service: Obstetrics;  Laterality: N/A;  . EXCISION BONE CYST     gum  . KNEE ARTHROSCOPY WITH MEDIAL MENISECTOMY Left 07/23/2015   Procedure: LEFT KNEE ARTHROSCOPY WITH PARTIAL MEDIAL MENISCECTOMY ;  Surgeon: Renette Butters, MD;  Location: [place];  Service: Orthopedics;  Laterality: Left;  . KNEE ARTHROSCOPY WITH MENISCAL REPAIR Left 07/23/2015   Procedure: LATERAL MENISCAL REPAIR;   Surgeon: Renette Butters, MD;  Location: [place];  Service: Orthopedics;  Laterality: Left;    Past medical history, surgical, family, and social history reviewed and updated in the EMR as appropriate.  Objective:  BP 102/70   Pulse 86   Ht 5\' 4"  (1.626 m)   Wt 254 lb 8 oz (115.4 kg)   LMP 12/30/2018   SpO2 98%   BMI 43.68 kg/m   Vitals and nursing note reviewed  General: NAD, pleasant, able to participate in exam HEENT: Small papule about 1 cm in the occipital region, midline with surrounding erythema, no fluctuance or drainage noted.  Cardiac: RRR, normal heart sounds, no murmurs. 2+ radial and PT pulses bilaterally Respiratory: CTAB, normal effort, No wheezes, rales or rhonchi Abdomen: soft, nontender, nondistended, no hepatic or splenomegaly, +BS Extremities: no edema or cyanosis. WWP. Skin: warm and dry, no rashes noted Neuro: alert and oriented x4, no focal deficits Psych: Normal affect and mood   Assessment & Plan:   Skin infection Patient presents today with small papular lesion on the back of her head. Lesion appears to be folliculitis given location and appearance on exam. Infection seem superficial minimal concern for abscess. No drainage or fluctuance. Given tenderness, erythema and duration of the lesion will treat with oral agent though patient is not exhibiting any signs of systemic infection. Low suspicion for MRSA.  --Will prescribe Keflex 500 mg tid  for 5 days. --Will also prescribe Diflucan 150 mg after completion of her treatment. --Will follow up if symptoms do not improve.      Lovena NeighboursAbdoulaye Kerron Sedano, MD [facility] PGY-3

## 2019-01-23 NOTE — Patient Instructions (Signed)
It was great seeing you today! We have addressed the following issues today  1. You have a skin infection in the back of your head. We will treat it with an antibiotic for 5 days avoid picking at it. If continue to be painful and bothersome please let us know. 2. I also sent diflucan which you can take after your treatment to avoid yeast infection.  If we did any lab work today, and the results require attention, either me or my nurse will get in touch with you. If everything is normal, you will get a letter in mail and a message via . If you don't hear from [place] in two weeks, please give [place] a call. Otherwise, we look forward to seeing you again at your next visit. If you have any questions or concerns before then, please call the clinic at [phone].  Please bring all your medications to every doctors visit  Sign up for My Chart to have easy access to your labs results, and communication with your Primary care physician. Please ask Front Desk for some assistance.   Please check-out at the front desk before leaving the clinic.    Take Care,   Dr. Andy Gauss

## 2019-03-01 ENCOUNTER — Other Ambulatory Visit: Payer: Self-pay

## 2019-03-01 ENCOUNTER — Ambulatory Visit (HOSPITAL_COMMUNITY)
Admission: EM | Admit: 2019-03-01 | Discharge: 2019-03-01 | Disposition: A | Discharge status: Home / Self Care | Payer: Medicaid Other | Attending: Emergency Medicine | Admitting: Emergency Medicine

## 2019-03-01 ENCOUNTER — Encounter (HOSPITAL_COMMUNITY): Payer: Self-pay

## 2019-03-01 DIAGNOSIS — L739 Follicular disorder, unspecified: Secondary | ICD-10-CM | POA: Diagnosis not present

## 2019-03-01 MED ORDER — FLUCONAZOLE 150 MG PO TABS: 150.0000 mg | ORAL_TABLET | Freq: Once | ORAL | 0 refills | Status: AC

## 2019-03-01 MED ORDER — DOXYCYCLINE HYCLATE 100 MG PO CAPS: 100.0000 mg | ORAL_CAPSULE | Freq: Two times a day (BID) | ORAL | 0 refills | Status: AC

## 2019-03-01 NOTE — Discharge Instructions (Signed)
Begin doxycycline twice daily for 10 days, avoid direct sun exposure while taking  Warm compresses  Follow up if areas growing in size, becoming more painful, fevers

## 2019-03-01 NOTE — ED Triage Notes (Signed)
Pt states she has abscess in the back of her head x 1 week. Pt states the pain scale is a 8.

## 2019-03-02 NOTE — ED Provider Notes (Signed)
[place]    CSN: [id number] Arrival date & time: 03/01/19  1512      History   Chief Complaint Chief Complaint  Patient presents with  . Abscess    HPI Krystal [name] is a 33 y.o. female history of DM type II, tobacco use, presenting today for evaluation of nodules in her scalp.  Patient states that approximately a month ago she started she developed a small nodule to the hairline.  This has resolved, but she has developed multiple clustered similar spots within her scalp.  They are associated with significant itching and have become more tender.  She denies any fevers.  Denies history of similar to prior 1 month ago.  Denies change in any hair products.  Denies any drainage.  HPI  Past Medical History:  Diagnosis Date  . Left ACL tear 07/2015  . Medial meniscus tear 07/2015   left knee  . Non-insulin dependent type 2 diabetes mellitus (HCC)    oral  . Obesity   . Substance abuse ([place])    percocet, currently on subutex    Patient Active Problem List   Diagnosis Date Noted  . Skin infection 01/23/2019  . Uncontrolled type 2 diabetes mellitus without complication, without long-term current use of insulin ([place]) 12/03/2018  . Moderate episode of recurrent major depressive disorder ([place]) 12/03/2018  . Encounter for planned induction of labor 12/14/2017  . Diabetes in pregnancy 10/19/2017  . Rubella non-immune status, antepartum 06/05/2017  . Supervision of high risk pregnancy, antepartum 06/04/2017  . H/O: substance abuse ([place]) 06/04/2017  . Healthcare maintenance 05/07/2015  . Bilateral knee pain 10/08/2014  . Acne 05/14/2014  . Alopecia 05/12/2014  . S/P C-section 08/18/2013  . GERD (gastroesophageal reflux disease) 08/30/2012  . HIDRADENITIS SUPPURATIVA 03/17/2009  . ECZEMA, ATOPIC 08/26/2007  . DEPRESSION, MILD 05/08/2007  . Diabetes mellitus ([place]) 10/04/2006  . OBESITY, NOS 10/04/2006  . TOBACCO DEPENDENCE 10/04/2006  . MIGRAINE, UNSPEC., W/O  INTRACTABLE MIGRAINE 10/04/2006    Past Surgical History:  Procedure Laterality Date  . ANTERIOR CRUCIATE LIGAMENT REPAIR Left 07/23/2015   Procedure: RECONSTRUCTION ANTERIOR CRUCIATE LIGAMENT (ACL) WITH GRAFTLINK HAMSTRING GRAFT;  Surgeon: Renette Butters, MD;  Location: [place];  Service: Orthopedics;  Laterality: Left;  . CESAREAN SECTION N/A 08/18/2013   Procedure: CESAREAN SECTION;  Surgeon: Donnamae Jude, MD;  Location: [facility];  Service: Obstetrics;  Laterality: N/A;  . EXCISION BONE CYST     gum  . KNEE ARTHROSCOPY WITH MEDIAL MENISECTOMY Left 07/23/2015   Procedure: LEFT KNEE ARTHROSCOPY WITH PARTIAL MEDIAL MENISCECTOMY ;  Surgeon: Renette Butters, MD;  Location: [place];  Service: Orthopedics;  Laterality: Left;  . KNEE ARTHROSCOPY WITH MENISCAL REPAIR Left 07/23/2015   Procedure: LATERAL MENISCAL REPAIR;  Surgeon: Renette Butters, MD;  Location: [place];  Service: Orthopedics;  Laterality: Left;    OB History    Gravida  3   Para  3   Term  3   Preterm      AB      Living  3     SAB      TAB      Ectopic      Multiple  0   Live Births  3            Home Medications    Prior to Admission medications   Medication Sig Start Date End Date Taking? Authorizing Provider  Accu-Chek Softclix  Lancets lancets USE AS DIRECTED UP TO FOUR TIMES DAILY 12/18/18   Diallo, Earna Coder, MD  acetaminophen (TYLENOL) 500 MG tablet Take 1,000 mg by mouth every 6 (six) hours as needed for moderate pain.    [provider]  Blood Glucose Monitoring Suppl (ACCU-CHEK AVIVA PLUS) w/Device KIT Use daily to check blood glucose level 12/18/18   Diallo, Abdoulaye, MD  Blood Glucose Monitoring Suppl (BLOOD GLUCOSE MONITOR SYSTEM) w/Device KIT Use with Accu-check lancets and strips to test blood sugar daily Check before eating in the morning and 2 hours after breakfast, 2 hours after lunch, and 2 hours after dinner 04/22/17    Karim-Rhoades, Walidah N, CNM  buprenorphine (SUBUTEX) 8 MG SUBL SL tablet DISSOLVE two AND one half tablets UNDER THE TONGUE daily AS DIRECTED 07/02/17   [provider]  doxycycline (VIBRAMYCIN) 100 MG capsule Take 1 capsule (100 mg total) by mouth 2 (two) times daily for 10 days. 03/01/19 03/11/19  ,  C, PA-C  FLUoxetine (PROZAC) 20 MG tablet Take 1 tablet (20 mg total) by mouth daily. 01/23/19   Diallo, Earna Coder, MD  fluticasone (FLONASE) 50 MCG/ACT nasal spray Place 2 sprays into both nostrils daily. 02/27/17   Diallo, Earna Coder, MD  glucose blood (ACCU-CHEK AVIVA PLUS) test strip TEST EVERY MORNING AND THEN TWO HOURS AFTER EACH MEAL 12/18/18   Diallo, Earna Coder, MD  glyBURIDE (DIABETA) 2.5 MG tablet Take 1 tablet (2.5 mg total) by mouth 2 (two) times daily with a meal. 12/03/18   Kempner Bing, DO  ibuprofen (ADVIL,MOTRIN) 600 MG tablet Take 1 tablet (600 mg total) by mouth every 6 (six) hours. 12/17/17   Katheren Shams, DO  metFORMIN (GLUCOPHAGE) 1000 MG tablet Take 1 tablet (1,000 mg total) by mouth 2 (two) times daily with a meal. 12/03/18   Puhi Bing, DO  pantoprazole (PROTONIX) 20 MG tablet Take 1 tablet (20 mg total) by mouth daily. 10/19/17   Chancy Milroy, MD  PRENATAL 27-1 MG TABS Take 1 tablet by mouth daily. 07/10/17   Virginia Rochester, NP    Family History Family History  Problem Relation Age of Onset  . Asthma Mother   . Diabetes Mother   . Alcohol abuse Mother   . Stroke Mother     Social History Social History   Tobacco Use  . Smoking status: Current Every Day Smoker    Packs/day: 0.50    Years: 10.00    Pack years: 5.00    Types: Cigarettes  . Smokeless tobacco: Never Used  Substance Use Topics  . Alcohol use: No  . Drug use: No    Comment: Hx percocet use, on subutex     Allergies   Patient has no known allergies.   Review of Systems Review of Systems  Constitutional: Negative for fatigue and fever.  HENT: Negative for  mouth sores.   Eyes: Negative for visual disturbance.  Respiratory: Negative for shortness of breath.   Cardiovascular: Negative for chest pain.  Gastrointestinal: Negative for abdominal pain, nausea and vomiting.  Genitourinary: Negative for genital sores.  Musculoskeletal: Negative for arthralgias and joint swelling.  Skin: Positive for color change and rash. Negative for wound.  Neurological: Negative for dizziness, weakness, light-headedness and headaches.     Physical Exam Triage Vital Signs ED Triage Vitals  Enc Vitals Group     BP 03/01/19 1617 109/69     Pulse Rate 03/01/19 1617 79     Resp 03/01/19 1617 18  Temp 03/01/19 1617 98.3 F (36.8 C)     Temp src --      SpO2 03/01/19 1617 98 %     Weight 03/01/19 1616 260 lb (117.9 kg)     Height --      Head Circumference --      Peak Flow --      Pain Score 03/01/19 1616 8     Pain Loc --      Pain Edu? --      Excl. in [place]? --    No data found.  Updated Vital Signs BP 109/69 (BP Location: Right Arm)   Pulse 79   Temp 98.3 F (36.8 C)   Resp 18   Wt 260 lb (117.9 kg)   LMP 02/03/2019   SpO2 98%   BMI 44.63 kg/m   Visual Acuity Right Eye Distance:   Left Eye Distance:   Bilateral Distance:    Right Eye Near:   Left Eye Near:    Bilateral Near:     Physical Exam Vitals signs and nursing note reviewed.  Constitutional:      Appearance: She is well-developed.     Comments: No acute distress  HENT:     Head: Normocephalic and atraumatic.     Nose: Nose normal.  Eyes:     Conjunctiva/sclera: Conjunctivae normal.  Neck:     Musculoskeletal: Neck supple.  Cardiovascular:     Rate and Rhythm: Normal rate.  Pulmonary:     Effort: Pulmonary effort is normal. No respiratory distress.  Abdominal:     General: There is no distension.  Musculoskeletal: Normal range of motion.  Skin:    General: Skin is warm and dry.     Comments: Scalp with clustered circular erythematous lesions with central  scabbing, slightly raised within scalp, approximately 4-5 inflamed lesions noted, 2 less erythematous older lesions to nape of neck, no fluctuance noted  Neurological:     Mental Status: She is alert and oriented to person, place, and time.      UC Treatments / Results  Labs (all labs ordered are listed, but only abnormal results are displayed) Labs Reviewed - No data to display  EKG   Radiology No results found.  Procedures Procedures (including critical care time)  Medications Ordered in UC Medications - No data to display  Initial Impression / Assessment and Plan / UC Course  I have reviewed the triage vital signs and the nursing notes.  Pertinent labs & imaging results that were available during my care of the patient were reviewed by me and considered in my medical decision making (see chart for details).     Patient with early abscesses versus inflamed cyst versus folliculitis to scalp.  No significant fluctuance or significantly raised, does not seem to warrant I&D.  Will treat with doxycycline and have apply warm compresses.  Advised to try to avoid itching if possible.  Follow-up if not resolving.Discussed strict return precautions. Patient verbalized understanding and is agreeable with plan.  Final Clinical Impressions(s) / UC Diagnoses   Final diagnoses:  Folliculitis     Discharge Instructions     Begin doxycycline twice daily for 10 days, avoid direct sun exposure while taking  Warm compresses  Follow up if areas growing in size, becoming more painful, fevers   ED Prescriptions    Medication Sig Dispense Auth. Provider   doxycycline (VIBRAMYCIN) 100 MG capsule Take 1 capsule (100 mg total) by mouth 2 (two) times daily for  10 days. 20 capsule ,  C, PA-C   fluconazole (DIFLUCAN) 150 MG tablet Take 1 tablet (150 mg total) by mouth once for 1 dose. 2 tablet ,  C, PA-C     Controlled Substance Prescriptions [place] Controlled  Substance Registry consulted? Not Applicable   Janith Lima, [place] 03/02/19 [phone]

## 2019-03-05 ENCOUNTER — Ambulatory Visit: Payer: Medicaid Other | Admitting: Family Medicine

## 2019-05-07 ENCOUNTER — Ambulatory Visit (INDEPENDENT_AMBULATORY_CARE_PROVIDER_SITE_OTHER): Payer: Medicaid Other | Admitting: Family Medicine

## 2019-05-07 ENCOUNTER — Other Ambulatory Visit: Payer: Self-pay

## 2019-05-07 VITALS — BP 124/74 | HR 97

## 2019-05-07 DIAGNOSIS — Z23 Encounter for immunization: Secondary | ICD-10-CM

## 2019-05-07 DIAGNOSIS — L0291 Cutaneous abscess, unspecified: Secondary | ICD-10-CM

## 2019-05-07 MED ORDER — DOXYCYCLINE HYCLATE 100 MG PO TABS: 100.0000 mg | ORAL_TABLET | Freq: Two times a day (BID) | ORAL | 0 refills | Status: AC

## 2019-05-07 NOTE — Progress Notes (Signed)
    Subjective:  Krystal [name] is a 33 y.o. female who presents to the [facility] today with a chief complaint of during skin abscesses.   HPI: Abscess Patient says that she has recurring skin abscesses, along hairline most recently prior to the 2 that she is pointing on today.  One is approximately 3 cm in diameter raised with a scabbed head over the top with erythema cellulitis extending approximately 1 cm around.  Located at sternum.  Second location is along bra line midclavicular left side approximately 4 inches below clavicle.  This is same size and appearance.  Patient denies any injections, does have a history of homelessness and said that she has often gone up to 3 to 4 days without showers regularly for the last few months.  She does have a place to stay but has been bouncing between friends and family.  She declines social work referral.  Need for immunization against influenza Patient consents to flu shot  Objective:  Physical Exam: BP 124/74   Pulse 97   LMP 04/06/2019   SpO2 98%   Breastfeeding No   Gen: NAD, conversing comfortably CV: RRR with no murmurs appreciated Pulm: NWOB, CTAB with no crackles, wheezes, or rhonchi MSK: no edema, cyanosis, or clubbing noted Skin: 2 lesions on anterior chest wall: One is approximately 3 cm in diameter raised with a scabbed head over the top with erythema cellulitis extending approximately 1 cm around.  Located at sternum.  Second location is along bra line midclavicular left side approximately 4 inches below clavicle.  This is same size and appearance. Neuro: grossly normal, moves all extremities Psych: Normal affect and thought content  No results found for this or any previous visit (from the past 72 hour(s)).   Assessment/Plan:  Need for immunization against influenza Patient consents to flu shot  Abscess Patient says that she has recurring skin abscesses, along hairline most recently prior to the 2 that she is pointing on  today.  One is approximately 3 cm in diameter raised with a scabbed head over the top with erythema cellulitis extending approximately 1 cm around.  Located at sternum.  Second location is along bra line midclavicular left side approximately 4 inches below clavicle.  This is same size and appearance.  Patient denies any injections, does have a history of homelessness and said that she has often gone up to 3 to 4 days without showers regularly for the last few months.  She does have a place to stay but has been bouncing between friends and family.  She declines social work referral.  Attempt to incise and drain, did receive some purulence, as possible there were loculations because deflation of the abscess was not complete.  Pain control the patient did not accommodate significant exploration.  No significant bleeding, wound is essentially hemostatic after incision and drainage.  Both wounds cleaned with Betadine, then alcohol, incised with a #11 scalpel with incision approximately 1 cm and a half to approximately half the depth of the wound.  Patient then cleaned and bandaged.  Will prescribe course of doxy for surrounding cellulitis coverage.  As this is a recurring issue patient has requested dermatology referral.   Sherene Sires, [place] - PGY3 05/09/2019 7:06 AM

## 2019-05-07 NOTE — Patient Instructions (Signed)
It was a pleasure to see you today! Thank you for choosing [facility] for your primary care. Krystal [name] was seen for abscess. Come back to the clinic if there is anything we can do..  Today we tried to help remove some of the infection from your abscesses using incision.  Will include some instructions below about skin care in the meantime.  We did send in an ambulatory referral to dermatology to help them working to some of the potential causes for these things recurring for you.   Please bring all your medications to every doctors visit   Sign up for My Chart to have easy access to your labs results, and communication with your Primary care physician.     Please check-out at the front desk before leaving the clinic.     Best,  Dr. Marthenia Rolling FAMILY MEDICINE RESIDENT - PGY3 05/07/2019 3:34 PM  Skin Abscess  A skin abscess is an infected area on or under your skin that contains a collection of pus and other material. An abscess may also be called a furuncle, carbuncle, or boil. An abscess can occur in or on almost any part of your body. Some abscesses break open (rupture) on their own. Most continue to get worse unless they are treated. The infection can spread deeper into the body and eventually into your blood, which can make you feel ill. Treatment usually involves draining the abscess. What are the causes? An abscess occurs when germs, like bacteria, pass through your skin and cause an infection. This may be caused by:  A scrape or cut on your skin.  A puncture wound through your skin, including a needle injection or insect bite.  Blocked oil or sweat glands.  Blocked and infected hair follicles.  A cyst that forms beneath your skin (sebaceous cyst) and becomes infected. What increases the risk? This condition is more likely to develop in people who:  Have a weak body defense system (immune system).  Have diabetes.  Have dry and irritated skin.  Get  frequent injections or use illegal IV drugs.  Have a foreign body in a wound, such as a splinter.  Have problems with their lymph system or veins. What are the signs or symptoms? Symptoms of this condition include:  A painful, firm bump under the skin.  A bump with pus at the top. This may break through the skin and drain. Other symptoms include:  Redness surrounding the abscess site.  Warmth.  Swelling of the lymph nodes (glands) near the abscess.  Tenderness.  A sore on the skin. How is this diagnosed? This condition may be diagnosed based on:  A physical exam.  Your medical history.  A sample of pus. This may be used to find out what is causing the infection.  Blood tests.  Imaging tests, such as an ultrasound, CT scan, or MRI. How is this treated? A small abscess that drains on its own may not need treatment. Treatment for larger abscesses may include:  Moist heat or heat pack applied to the area several times a day.  A procedure to drain the abscess (incision and drainage).  Antibiotic medicines. For a severe abscess, you may first get antibiotics through an IV and then change to antibiotics by mouth. Follow these instructions at home: Medicines   Take over-the-counter and prescription medicines only as told by your health care provider.  If you were prescribed an antibiotic medicine, take it as told by your health care  provider. Do not stop taking the antibiotic even if you start to feel better. Abscess care   If you have an abscess that has not drained, apply heat to the affected area. Use the heat source that your health care provider recommends, such as a moist heat pack or a heating pad. ? Place a towel between your skin and the heat source. ? Leave the heat on for 20-30 minutes. ? Remove the heat if your skin turns bright red. This is especially important if you are unable to feel pain, heat, or cold. You may have a greater risk of getting  burned.  Follow instructions from your health care provider about how to take care of your abscess. Make sure you: ? Cover the abscess with a bandage (dressing). ? Change your dressing or gauze as told by your health care provider. ? Wash your hands with soap and water before you change the dressing or gauze. If soap and water are not available, use hand sanitizer.  Check your abscess every day for signs of a worsening infection. Check for: ? More redness, swelling, or pain. ? More fluid or blood. ? Warmth. ? More pus or a bad smell. General instructions  To avoid spreading the infection: ? Do not share personal care items, towels, or hot tubs with others. ? Avoid making skin contact with other people.  Keep all follow-up visits as told by your health care provider. This is important. Contact a health care provider if you have:  More redness, swelling, or pain around your abscess.  More fluid or blood coming from your abscess.  Warm skin around your abscess.  More pus or a bad smell coming from your abscess.  A fever.  Muscle aches.  Chills or a general ill feeling. Get help right away if you:  Have severe pain.  See red streaks on your skin spreading away from the abscess. Summary  A skin abscess is an infected area on or under your skin that contains a collection of pus and other material.  A small abscess that drains on its own may not need treatment.  Treatment for larger abscesses may include having a procedure to drain the abscess and taking an antibiotic. This information is not intended to replace advice given to you by your health care provider. Make sure you discuss any questions you have with your health care provider. Document Released: 05/03/2005 Document Revised: 11/14/2018 Document Reviewed: 09/06/2017 Elsevier Patient Education  2020 [employer].

## 2019-05-09 DIAGNOSIS — L0291 Cutaneous abscess, unspecified: Secondary | ICD-10-CM | POA: Insufficient documentation

## 2019-05-09 DIAGNOSIS — Z23 Encounter for immunization: Secondary | ICD-10-CM | POA: Insufficient documentation

## 2019-05-09 NOTE — Assessment & Plan Note (Signed)
Patient says that she has recurring skin abscesses, along hairline most recently prior to the 2 that she is pointing on today.  One is approximately 3 cm in diameter raised with a scabbed head over the top with erythema cellulitis extending approximately 1 cm around.  Located at sternum.  Second location is along bra line midclavicular left side approximately 4 inches below clavicle.  This is same size and appearance.  Patient denies any injections, does have a history of homelessness and said that she has often gone up to 3 to 4 days without showers regularly for the last few months.  She does have a place to stay but has been bouncing between friends and family.  She declines social work referral.  Attempt to incise and drain, did receive some purulence, as possible there were loculations because deflation of the abscess was not complete.  Pain control the patient did not accommodate significant exploration.  No significant bleeding, wound is essentially hemostatic after incision and drainage.  Both wounds cleaned with Betadine, then alcohol, incised with a #11 scalpel with incision approximately 1 cm and a half to approximately half the depth of the wound.  Patient then cleaned and bandaged.  Will prescribe course of doxy for surrounding cellulitis coverage.  As this is a recurring issue patient has requested dermatology referral.

## 2019-05-09 NOTE — Assessment & Plan Note (Signed)
Patient consents to flu shot 

## 2019-10-01 ENCOUNTER — Encounter: Payer: Self-pay | Admitting: Family Medicine

## 2019-10-01 ENCOUNTER — Telehealth (INDEPENDENT_AMBULATORY_CARE_PROVIDER_SITE_OTHER): Payer: Medicaid Other | Admitting: Family Medicine

## 2019-10-01 DIAGNOSIS — J020 Streptococcal pharyngitis: Secondary | ICD-10-CM | POA: Insufficient documentation

## 2019-10-01 MED ORDER — PENICILLIN V POTASSIUM 500 MG PO TABS: 500.0000 mg | ORAL_TABLET | Freq: Three times a day (TID) | ORAL | 0 refills | Status: AC

## 2019-10-01 NOTE — Progress Notes (Signed)
[place] [facility] Telemedicine Visit  Patient consented to have virtual visit. Method of visit: Video  Encounter participants: Patient: Krystal [name] - located at home Provider: Lennox Solders - located at [facility] Others (if applicable): none  Chief Complaint: sore throat  HPI:  Patient reports that over the last 2 days, she has began to have a sore throat.  She also has swollen lymph nodes in the front of her neck and has seen a white exudate on her tonsils, which appears swollen as well.  She had a fever earlier this morning and has had a headache.  She denies cough.  Her husband was recently diagnosed with strep throat.  She says that the symptoms near the symptoms she has had with previous episodes of strep throat.  She denies chest pain, shortness of breath, presyncope, and rash.  She has no known drug allergies.  ROS: per HPI  Pertinent PMHx: Type 2 diabetes, tobacco dependence  Exam:  Respiratory: Able to speak in complete sentences without shortness of breath.  No cough heard.  Assessment/Plan:  Strep throat According to patient's Centor score, she has about a 50% chance of having strep throat given her reported symptoms.  Her exposure to an individual with strep throat also makes this diagnosis likely.  We will treat empirically with penicillin 500 mg 3 times daily for 10 days.  Patient was given return precautions and encouraged to call back if she experiences adverse effects from the antibiotic or if she does not improve within the week.    Time spent during visit with patient: 6 minutes

## 2019-10-01 NOTE — Assessment & Plan Note (Signed)
According to patient's Centor score, she has about a 50% chance of having strep throat given her reported symptoms.  Her exposure to an individual with strep throat also makes this diagnosis likely.  We will treat empirically with penicillin 500 mg 3 times daily for 10 days.  Patient was given return precautions and encouraged to call back if she experiences adverse effects from the antibiotic or if she does not improve within the week.

## 2019-10-07 DIAGNOSIS — F331 Major depressive disorder, recurrent, moderate: Secondary | ICD-10-CM | POA: Diagnosis not present

## 2019-10-08 ENCOUNTER — Ambulatory Visit (INDEPENDENT_AMBULATORY_CARE_PROVIDER_SITE_OTHER): Payer: Medicaid Other | Admitting: Family Medicine

## 2019-10-08 ENCOUNTER — Other Ambulatory Visit (HOSPITAL_COMMUNITY)
Admission: RE | Admit: 2019-10-08 | Discharge: 2019-10-08 | Disposition: A | Discharge status: Home / Self Care | Payer: Medicaid Other | Source: Ambulatory Visit | Attending: Family Medicine | Admitting: Family Medicine

## 2019-10-08 ENCOUNTER — Encounter: Payer: Self-pay | Admitting: Family Medicine

## 2019-10-08 ENCOUNTER — Other Ambulatory Visit: Payer: Self-pay

## 2019-10-08 VITALS — BP 120/66 | HR 88 | Ht 64.0 in | Wt 234.0 lb

## 2019-10-08 DIAGNOSIS — Z124 Encounter for screening for malignant neoplasm of cervix: Secondary | ICD-10-CM | POA: Diagnosis not present

## 2019-10-08 DIAGNOSIS — Z309 Encounter for contraceptive management, unspecified: Secondary | ICD-10-CM | POA: Insufficient documentation

## 2019-10-08 DIAGNOSIS — L739 Follicular disorder, unspecified: Secondary | ICD-10-CM

## 2019-10-08 DIAGNOSIS — E118 Type 2 diabetes mellitus with unspecified complications: Secondary | ICD-10-CM | POA: Diagnosis not present

## 2019-10-08 LAB — POCT WET PREP (WET MOUNT)
Clue Cells Wet Prep Whiff POC: NEGATIVE
Trichomonas Wet Prep HPF POC: ABSENT

## 2019-10-08 LAB — POCT GLYCOSYLATED HEMOGLOBIN (HGB A1C): HbA1c, POC (controlled diabetic range): 9.3 % — AB (ref 0.0–7.0)

## 2019-10-08 LAB — POCT URINE PREGNANCY: Preg Test, Ur: NEGATIVE

## 2019-10-08 MED ORDER — NORETHIN ACE-ETH ESTRAD-FE 1-20 MG-MCG(24) PO TABS: 1.0000 | ORAL_TABLET | Freq: Every day | ORAL | 11 refills | Status: DC

## 2019-10-08 NOTE — Progress Notes (Signed)
    SUBJECTIVE:   CHIEF COMPLAINT / HPI:  Discuss Birth control  Encounter for Contraceptive Mgmt: G3P3003 patient presenting to discuss birth control. Patient has previously had IUD which was removed Feb 2018, patient started on Loestrin. Had discussion of doing BTL after last pregnancy in May 2019 but did not do it. Instead is interested in another IUD. Of note, patient smokes 1/2 ppd, BMI 40. Has history of migraine but no aura. Denies vaginal discharge, itch, abnormal bleeding, dyspareunia, or unusual vaginal odor. Of note she is overdue for Pap Smear, last Pap October 2016 normal.   Diabetes: last HbA1c 10.7% in April 2020. Today HbA1c 9.3%.  She says she has not been taking her medicine as prescribed, often misses doses. On Metformin 1000mg  BID and Glyburide 2.5mg  BID. She has been eating better to lose weight and improve her health. Has intentionally lost 20lbs since June 2020 (8 months).  Denies polyphagia, polyuria, and polydipsia.   PERTINENT  PMH / PSH:  T2DM - last HbA1c 10.7% (April 2020) GERD Depression Obesity BMI 40 Tobacco Use Disorder  OBJECTIVE:   BP 120/66   Pulse 88   Ht 5\' 4"  (1.626 m)   Wt 234 lb (106.1 kg)   LMP 09/11/2019   SpO2 98%   BMI 40.17 kg/m    Physical Exam:  General: NAD, pleasant patient Cardiac: RRR, S1S2 present, no murmurs appreciated Resp: CTA bilaterally GU: External genitalia without erythema, exudate or discharge. Vaginal vault is without discharge. Cervix is of normal color without lesion. The os is closed. There is no bleeding noted. Uterus is noted to be of normal size and nontender. No cervical motion tenderness is seen. No masses are palpated. The adnexa are without masses or tenderness. Skin: 5-6 small bald patches appreciated where patient had previous folliculitis, now healed, no active scalp lesions  ASSESSMENT/PLAN:   Routine cervical smear - Routing Cervical smear performed, will contact patient with results, follow up  depending on results  Encounter for contraceptive management - Patient desires IUD again, will reschedule to have that placed - Prescribed Loestrin in the meantime (need to be careful with Estrogen d/t hx of migraine although no aura, obesity, smoking historym and age in 54s, although not yet 37)  Folliculitis Patient with history of scalp folloculitis, 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 Would like to have this further evaluated. No active lesions at this time. - Referral placed to Dermatology  Type 2 diabetes mellitus with complication, without long-term current use of insulin (HCC) HbA1c today improved at 9.3%. Most likely has to do with her lifestyle changes and 20-lb weightloss. Says she has nott been taking her meds as prescribed. Prescribed Metformin 1000mg  BID and Glyburide 2.5mg  BID. - Encouraged medication compliance as HbA1c is still elevated and Metformin will help with weight loss - Follow up 3 months for repeat HbA1c check    11/09/2019, DO [place] [facility]

## 2019-10-08 NOTE — Patient Instructions (Addendum)
Thank you for coming in to see [place] today! Please see below to review our plan for today's visit:  1. We will call you with results of the pap smear.  2. Continue to cut back on smoking.  3. I have placed a referral for you for dermatology (for folliculitis).   Please call the clinic at [phone] if your symptoms worsen or you have any concerns. It was our pleasure to serve you!  Dr. Peggyann Shoals [facility]   Journal for Nurse Practitioners, 15(4), [phone]. Retrieved May 13, 2018 from [email or web address].com/[email or web address]">   Knee Exercises Ask your health care provider which exercises are safe for you. Do exercises exactly as told by your health care provider and adjust them as directed. It is normal to feel mild stretching, pulling, tightness, or discomfort as you do these exercises. Stop right away if you feel sudden pain or your pain gets worse. Do not begin these exercises until told by your health care provider. Stretching and range-of-motion exercises These exercises warm up your muscles and joints and improve the movement and flexibility of your knee. These exercises also help to relieve pain and swelling. Knee extension, prone 1. Lie on your abdomen (prone position) on a bed. 2. Place your left / right knee just beyond the edge of the surface so your knee is not on the bed. You can put a towel under your left / right thigh just above your kneecap for comfort. 3. Relax your leg muscles and allow gravity to straighten your knee (extension). You should feel a stretch behind your left / right knee. 4. Hold this position for __________ seconds. 5. Scoot up so your knee is supported between repetitions. Repeat __________ times. Complete this exercise __________ times a day. Knee flexion, active  1. Lie on your back with both legs straight. If this causes back discomfort, bend your left / right knee so your foot is flat on the floor. 2. Slowly slide your left / right  heel back toward your buttocks. Stop when you feel a gentle stretch in the front of your knee or thigh (flexion). 3. Hold this position for __________ seconds. 4. Slowly slide your left / right heel back to the starting position. Repeat __________ times. Complete this exercise __________ times a day. Quadriceps stretch, prone  1. Lie on your abdomen on a firm surface, such as a bed or padded floor. 2. Bend your left / right knee and hold your ankle. If you cannot reach your ankle or pant leg, loop a belt around your foot and grab the belt instead. 3. Gently pull your heel toward your buttocks. Your knee should not slide out to the side. You should feel a stretch in the front of your thigh and knee (quadriceps). 4. Hold this position for __________ seconds. Repeat __________ times. Complete this exercise __________ times a day. Hamstring, supine 1. Lie on your back (supine position). 2. Loop a belt or towel over the ball of your left / right foot. The ball of your foot is on the walking surface, right under your toes. 3. Straighten your left / right knee and slowly pull on the belt to raise your leg until you feel a gentle stretch behind your knee (hamstring). ? Do not let your knee bend while you do this. ? Keep your other leg flat on the floor. 4. Hold this position for __________ seconds. Repeat __________ times. Complete this exercise __________ times a day. Strengthening exercises These exercises build  strength and endurance in your knee. Endurance is the ability to use your muscles for a long time, even after they get tired. Quadriceps, isometric This exercise stretches the muscles in front of your thigh (quadriceps) without moving your knee joint (isometric). 1. Lie on your back with your left / right leg extended and your other knee bent. Put a rolled towel or small pillow under your knee if told by your health care provider. 2. Slowly tense the muscles in the front of your left /  right thigh. You should see your kneecap slide up toward your hip or see increased dimpling just above the knee. This motion will push the back of the knee toward the floor. 3. For __________ seconds, hold the muscle as tight as you can without increasing your pain. 4. Relax the muscles slowly and completely. Repeat __________ times. Complete this exercise __________ times a day. Straight leg raises This exercise stretches the muscles in front of your thigh (quadriceps) and the muscles that move your hips (hip flexors). 1. Lie on your back with your left / right leg extended and your other knee bent. 2. Tense the muscles in the front of your left / right thigh. You should see your kneecap slide up or see increased dimpling just above the knee. Your thigh may even shake a bit. 3. Keep these muscles tight as you raise your leg 4-6 inches (10-15 cm) off the floor. Do not let your knee bend. 4. Hold this position for __________ seconds. 5. Keep these muscles tense as you lower your leg. 6. Relax your muscles slowly and completely after each repetition. Repeat __________ times. Complete this exercise __________ times a day. Hamstring, isometric 1. Lie on your back on a firm surface. 2. Bend your left / right knee about __________ degrees. 3. Dig your left / right heel into the surface as if you are trying to pull it toward your buttocks. Tighten the muscles in the back of your thighs (hamstring) to "dig" as hard as you can without increasing any pain. 4. Hold this position for __________ seconds. 5. Release the tension gradually and allow your muscles to relax completely for __________ seconds after each repetition. Repeat __________ times. Complete this exercise __________ times a day. Hamstring curls If told by your health care provider, do this exercise while wearing ankle weights. Begin with __________ lb weights. Then increase the weight by 1 lb (0.5 kg) increments. Do not wear ankle weights  that are more than __________ lb. 1. Lie on your abdomen with your legs straight. 2. Bend your left / right knee as far as you can without feeling pain. Keep your hips flat against the floor. 3. Hold this position for __________ seconds. 4. Slowly lower your leg to the starting position. Repeat __________ times. Complete this exercise __________ times a day. Squats This exercise strengthens the muscles in front of your thigh and knee (quadriceps). 1. Stand in front of a table, with your feet and knees pointing straight ahead. You may rest your hands on the table for balance but not for support. 2. Slowly bend your knees and lower your hips like you are going to sit in a chair. ? Keep your weight over your heels, not over your toes. ? Keep your lower legs upright so they are parallel with the table legs. ? Do not let your hips go lower than your knees. ? Do not bend lower than told by your health care provider. ? If your knee  pain increases, do not bend as low. 3. Hold the squat position for __________ seconds. 4. Slowly push with your legs to return to standing. Do not use your hands to pull yourself to standing. Repeat __________ times. Complete this exercise __________ times a day. Wall slides This exercise strengthens the muscles in front of your thigh and knee (quadriceps). 1. Lean your back against a smooth wall or door, and walk your feet out 18-24 inches (46-61 cm) from it. 2. Place your feet hip-width apart. 3. Slowly slide down the wall or door until your knees bend __________ degrees. Keep your knees over your heels, not over your toes. Keep your knees in line with your hips. 4. Hold this position for __________ seconds. Repeat __________ times. Complete this exercise __________ times a day. Straight leg raises This exercise strengthens the muscles that rotate the leg at the hip and move it away from your body (hip abductors). 1. Lie on your side with your left / right leg in the  top position. Lie so your head, shoulder, knee, and hip line up. You may bend your bottom knee to help you keep your balance. 2. Roll your hips slightly forward so your hips are stacked directly over each other and your left / right knee is facing forward. 3. Leading with your heel, lift your top leg 4-6 inches (10-15 cm). You should feel the muscles in your outer hip lifting. ? Do not let your foot drift forward. ? Do not let your knee roll toward the ceiling. 4. Hold this position for __________ seconds. 5. Slowly return your leg to the starting position. 6. Let your muscles relax completely after each repetition. Repeat __________ times. Complete this exercise __________ times a day. Straight leg raises This exercise stretches the muscles that move your hips away from the front of the pelvis (hip extensors). 1. Lie on your abdomen on a firm surface. You can put a pillow under your hips if that is more comfortable. 2. Tense the muscles in your buttocks and lift your left / right leg about 4-6 inches (10-15 cm). Keep your knee straight as you lift your leg. 3. Hold this position for __________ seconds. 4. Slowly lower your leg to the starting position. 5. Let your leg relax completely after each repetition. Repeat __________ times. Complete this exercise __________ times a day. This information is not intended to replace advice given to you by your health care provider. Make sure you discuss any questions you have with your health care provider. Document Revised: 05/14/2018 Document Reviewed: 05/14/2018 Elsevier Patient Education  2020 [employer].

## 2019-10-10 LAB — CYTOLOGY - PAP
Chlamydia: NEGATIVE
Comment: NEGATIVE
Comment: NEGATIVE
Comment: NEGATIVE
Comment: NORMAL
Diagnosis: NEGATIVE
High risk HPV: NEGATIVE
Neisseria Gonorrhea: NEGATIVE
Trichomonas: NEGATIVE

## 2019-10-13 DIAGNOSIS — M25561 Pain in right knee: Secondary | ICD-10-CM | POA: Insufficient documentation

## 2019-10-13 DIAGNOSIS — L739 Follicular disorder, unspecified: Secondary | ICD-10-CM | POA: Insufficient documentation

## 2019-10-13 DIAGNOSIS — Z124 Encounter for screening for malignant neoplasm of cervix: Secondary | ICD-10-CM | POA: Insufficient documentation

## 2019-10-13 NOTE — Assessment & Plan Note (Signed)
-   Patient desires IUD again, will reschedule to have that placed - Prescribed Loestrin in the meantime (need to be careful with Estrogen d/t hx of migraine although no aura, obesity, smoking historym and age in 29s, although not yet 73)

## 2019-10-13 NOTE — Assessment & Plan Note (Signed)
HbA1c today improved at 9.3%. Most likely has to do with her lifestyle changes and 20-lb weightloss. Says she has nott been taking her meds as prescribed. Prescribed Metformin 1000mg  BID and Glyburide 2.5mg  BID. - Encouraged medication compliance as HbA1c is still elevated and Metformin will help with weight loss - Follow up 3 months for repeat HbA1c check

## 2019-10-13 NOTE — Assessment & Plan Note (Signed)
-   Routing Cervical smear performed, will contact patient with results, follow up depending on results

## 2019-10-13 NOTE — Assessment & Plan Note (Signed)
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 Would like to have this further evaluated. No active lesions at this time. - Referral placed to Dermatology

## 2019-10-14 DIAGNOSIS — F331 Major depressive disorder, recurrent, moderate: Secondary | ICD-10-CM | POA: Diagnosis not present

## 2019-10-21 DIAGNOSIS — F331 Major depressive disorder, recurrent, moderate: Secondary | ICD-10-CM | POA: Diagnosis not present

## 2019-10-28 DIAGNOSIS — F331 Major depressive disorder, recurrent, moderate: Secondary | ICD-10-CM | POA: Diagnosis not present

## 2019-11-04 DIAGNOSIS — F331 Major depressive disorder, recurrent, moderate: Secondary | ICD-10-CM | POA: Diagnosis not present

## 2019-11-11 DIAGNOSIS — F331 Major depressive disorder, recurrent, moderate: Secondary | ICD-10-CM | POA: Diagnosis not present

## 2019-11-18 DIAGNOSIS — F331 Major depressive disorder, recurrent, moderate: Secondary | ICD-10-CM | POA: Diagnosis not present

## 2019-11-25 DIAGNOSIS — F331 Major depressive disorder, recurrent, moderate: Secondary | ICD-10-CM | POA: Diagnosis not present

## 2019-12-02 DIAGNOSIS — F331 Major depressive disorder, recurrent, moderate: Secondary | ICD-10-CM | POA: Diagnosis not present

## 2019-12-10 DIAGNOSIS — F331 Major depressive disorder, recurrent, moderate: Secondary | ICD-10-CM | POA: Diagnosis not present

## 2019-12-30 DIAGNOSIS — Z5181 Encounter for therapeutic drug level monitoring: Secondary | ICD-10-CM | POA: Diagnosis not present

## 2020-01-12 ENCOUNTER — Ambulatory Visit: Payer: Medicaid Other | Admitting: Family Medicine

## 2020-03-11 ENCOUNTER — Other Ambulatory Visit: Payer: Self-pay

## 2020-03-11 ENCOUNTER — Ambulatory Visit (INDEPENDENT_AMBULATORY_CARE_PROVIDER_SITE_OTHER): Payer: Medicaid Other | Admitting: Lactation Services

## 2020-03-11 DIAGNOSIS — Z3201 Encounter for pregnancy test, result positive: Secondary | ICD-10-CM

## 2020-03-11 LAB — POCT PREGNANCY, URINE: Preg Test, Ur: POSITIVE — AB

## 2020-03-11 MED ORDER — PROMETHAZINE HCL 25 MG PO TABS: 25.0000 mg | ORAL_TABLET | Freq: Four times a day (QID) | ORAL | 0 refills | Status: DC | PRN

## 2020-03-11 NOTE — Progress Notes (Signed)
Patient dropped off urine for pregnancy test. UPT +.   Called patient with results of UPT.   LMP end of March/early April, she reports abnormal periods. [place] ordered for dating, scheduled for 8/11 @ 9 . Patient notified to arrive at 08:45 with full bladder. Unable to reach patient when called back with [place] appt, LM on her answering machine.   Patient denies pain or bleeding. Reviewed ectopic precautions and to seek emergency care at [facility] for heavy bleeding or severe pain.   She reports "bad"  nausea. Phenergan ordered.   She is taking PNV gummies.  Patient with no questions or concerns at this time.

## 2020-03-15 NOTE — Progress Notes (Signed)
Patient was assessed and managed by nursing staff during this encounter. I have reviewed the chart and agree with the documentation and plan. I have also made any necessary editorial changes.  Donyelle Enyeart, MD 03/15/2020 8:52 AM   

## 2020-03-17 ENCOUNTER — Ambulatory Visit: Payer: Medicaid Other

## 2020-03-17 ENCOUNTER — Ambulatory Visit: Admission: RE | Admit: 2020-03-17 | Payer: Medicaid Other | Source: Ambulatory Visit

## 2020-04-01 ENCOUNTER — Other Ambulatory Visit: Payer: Self-pay

## 2020-04-01 ENCOUNTER — Other Ambulatory Visit: Payer: Self-pay | Admitting: Obstetrics and Gynecology

## 2020-04-01 ENCOUNTER — Ambulatory Visit (HOSPITAL_BASED_OUTPATIENT_CLINIC_OR_DEPARTMENT_OTHER): Payer: Medicaid Other

## 2020-04-01 ENCOUNTER — Ambulatory Visit: Payer: Medicaid Other

## 2020-04-01 ENCOUNTER — Ambulatory Visit
Admission: RE | Admit: 2020-04-01 | Discharge: 2020-04-01 | Disposition: A | Discharge status: Home / Self Care | Payer: Medicaid Other | Source: Ambulatory Visit | Attending: Obstetrics and Gynecology | Admitting: Obstetrics and Gynecology

## 2020-04-01 DIAGNOSIS — F1120 Opioid dependence, uncomplicated: Secondary | ICD-10-CM

## 2020-04-01 DIAGNOSIS — Z3687 Encounter for antenatal screening for uncertain dates: Secondary | ICD-10-CM | POA: Insufficient documentation

## 2020-04-01 DIAGNOSIS — O99212 Obesity complicating pregnancy, second trimester: Secondary | ICD-10-CM | POA: Diagnosis not present

## 2020-04-01 DIAGNOSIS — O0932 Supervision of pregnancy with insufficient antenatal care, second trimester: Secondary | ICD-10-CM | POA: Diagnosis not present

## 2020-04-01 DIAGNOSIS — E669 Obesity, unspecified: Secondary | ICD-10-CM | POA: Diagnosis not present

## 2020-04-01 DIAGNOSIS — Z79891 Long term (current) use of opiate analgesic: Secondary | ICD-10-CM | POA: Diagnosis not present

## 2020-04-01 DIAGNOSIS — O99322 Drug use complicating pregnancy, second trimester: Secondary | ICD-10-CM | POA: Diagnosis not present

## 2020-04-01 DIAGNOSIS — O34219 Maternal care for unspecified type scar from previous cesarean delivery: Secondary | ICD-10-CM | POA: Diagnosis not present

## 2020-04-01 DIAGNOSIS — O24112 Pre-existing diabetes mellitus, type 2, in pregnancy, second trimester: Secondary | ICD-10-CM

## 2020-04-01 DIAGNOSIS — Z3A17 17 weeks gestation of pregnancy: Secondary | ICD-10-CM | POA: Diagnosis not present

## 2020-04-01 DIAGNOSIS — Z3201 Encounter for pregnancy test, result positive: Secondary | ICD-10-CM | POA: Diagnosis not present

## 2020-04-02 ENCOUNTER — Encounter: Payer: Self-pay | Admitting: Obstetrics and Gynecology

## 2020-04-02 NOTE — Progress Notes (Signed)
I'm not sure how she got an anatomy u/s ordered under my name but I don't see a new OB visit with [place] in the system.  Can you call and make her an asap new ob visit with [place]? thanks  Cornelia Copa MD Attending Center for [employer] (Faculty Practice) 04/02/2020 Time: [phone]

## 2020-04-14 ENCOUNTER — Encounter: Payer: Medicaid Other | Admitting: Obstetrics & Gynecology

## 2020-04-14 ENCOUNTER — Encounter: Payer: Self-pay | Admitting: Obstetrics & Gynecology

## 2020-05-05 ENCOUNTER — Other Ambulatory Visit: Payer: Self-pay | Admitting: *Deleted

## 2020-05-05 DIAGNOSIS — Z362 Encounter for other antenatal screening follow-up: Secondary | ICD-10-CM

## 2020-05-13 DIAGNOSIS — Z3A23 23 weeks gestation of pregnancy: Secondary | ICD-10-CM | POA: Diagnosis not present

## 2020-05-13 DIAGNOSIS — O24419 Gestational diabetes mellitus in pregnancy, unspecified control: Secondary | ICD-10-CM | POA: Diagnosis not present

## 2020-05-17 ENCOUNTER — Encounter: Payer: Self-pay | Admitting: Obstetrics and Gynecology

## 2020-05-17 ENCOUNTER — Other Ambulatory Visit (HOSPITAL_COMMUNITY)
Admission: RE | Admit: 2020-05-17 | Discharge: 2020-05-17 | Disposition: A | Discharge status: Home / Self Care | Payer: Medicaid Other | Source: Ambulatory Visit | Attending: Obstetrics and Gynecology | Admitting: Obstetrics and Gynecology

## 2020-05-17 ENCOUNTER — Ambulatory Visit (INDEPENDENT_AMBULATORY_CARE_PROVIDER_SITE_OTHER): Payer: Medicaid Other | Admitting: Obstetrics and Gynecology

## 2020-05-17 ENCOUNTER — Ambulatory Visit: Payer: Medicaid Other | Attending: Obstetrics and Gynecology

## 2020-05-17 ENCOUNTER — Encounter: Payer: Self-pay | Admitting: *Deleted

## 2020-05-17 ENCOUNTER — Ambulatory Visit: Payer: Medicaid Other | Admitting: *Deleted

## 2020-05-17 ENCOUNTER — Other Ambulatory Visit: Payer: Self-pay

## 2020-05-17 ENCOUNTER — Other Ambulatory Visit: Payer: Self-pay | Admitting: *Deleted

## 2020-05-17 VITALS — BP 118/62 | HR 96

## 2020-05-17 VITALS — BP 126/81 | HR 101 | Wt 274.0 lb

## 2020-05-17 DIAGNOSIS — F1120 Opioid dependence, uncomplicated: Secondary | ICD-10-CM | POA: Insufficient documentation

## 2020-05-17 DIAGNOSIS — O24112 Pre-existing diabetes mellitus, type 2, in pregnancy, second trimester: Secondary | ICD-10-CM

## 2020-05-17 DIAGNOSIS — O99320 Drug use complicating pregnancy, unspecified trimester: Secondary | ICD-10-CM | POA: Insufficient documentation

## 2020-05-17 DIAGNOSIS — O0930 Supervision of pregnancy with insufficient antenatal care, unspecified trimester: Secondary | ICD-10-CM | POA: Insufficient documentation

## 2020-05-17 DIAGNOSIS — O99212 Obesity complicating pregnancy, second trimester: Secondary | ICD-10-CM | POA: Diagnosis not present

## 2020-05-17 DIAGNOSIS — Z3A24 24 weeks gestation of pregnancy: Secondary | ICD-10-CM

## 2020-05-17 DIAGNOSIS — O99322 Drug use complicating pregnancy, second trimester: Secondary | ICD-10-CM | POA: Diagnosis not present

## 2020-05-17 DIAGNOSIS — O0932 Supervision of pregnancy with insufficient antenatal care, second trimester: Secondary | ICD-10-CM

## 2020-05-17 DIAGNOSIS — Z6841 Body Mass Index (BMI) 40.0 and over, adult: Secondary | ICD-10-CM

## 2020-05-17 DIAGNOSIS — Z3687 Encounter for antenatal screening for uncertain dates: Secondary | ICD-10-CM | POA: Diagnosis not present

## 2020-05-17 DIAGNOSIS — Z79891 Long term (current) use of opiate analgesic: Secondary | ICD-10-CM

## 2020-05-17 DIAGNOSIS — O0990 Supervision of high risk pregnancy, unspecified, unspecified trimester: Secondary | ICD-10-CM | POA: Insufficient documentation

## 2020-05-17 DIAGNOSIS — O24312 Unspecified pre-existing diabetes mellitus in pregnancy, second trimester: Secondary | ICD-10-CM

## 2020-05-17 DIAGNOSIS — Z3482 Encounter for supervision of other normal pregnancy, second trimester: Secondary | ICD-10-CM | POA: Diagnosis not present

## 2020-05-17 DIAGNOSIS — Z362 Encounter for other antenatal screening follow-up: Secondary | ICD-10-CM

## 2020-05-17 DIAGNOSIS — Z315 Encounter for genetic counseling: Secondary | ICD-10-CM | POA: Diagnosis not present

## 2020-05-17 DIAGNOSIS — O34219 Maternal care for unspecified type scar from previous cesarean delivery: Secondary | ICD-10-CM

## 2020-05-17 DIAGNOSIS — Z98891 History of uterine scar from previous surgery: Secondary | ICD-10-CM

## 2020-05-17 MED ORDER — PREPLUS 27-1 MG PO TABS: 1.0000 | ORAL_TABLET | Freq: Every day | ORAL | 1 refills | Status: DC

## 2020-05-17 MED ORDER — ASPIRIN EC 81 MG PO TBEC: 81.0000 mg | DELAYED_RELEASE_TABLET | Freq: Every day | ORAL | 1 refills | Status: DC

## 2020-05-17 MED ORDER — ACCU-CHEK GUIDE W/DEVICE KIT: 1.0000 | PACK | Freq: Every day | 0 refills | Status: DC

## 2020-05-17 MED ORDER — ACCU-CHEK SOFTCLIX LANCETS MISC: 12 refills | Status: DC

## 2020-05-17 MED ORDER — ACCU-CHEK GUIDE VI STRP: ORAL_STRIP | 12 refills | Status: DC

## 2020-05-17 NOTE — Addendum Note (Signed)
Addended by: Ernestina Patches on: 05/17/2020 05:05 PM   Modules accepted: Orders

## 2020-05-17 NOTE — Progress Notes (Signed)
New OB Note  05/17/2020   Clinic: [facility] for [facility]  Chief Complaint: new OB  Transfer of Care Patient: no  History of Present Illness: Ms. Hole is a 34 y.o. [id number] @ 24/3 weeks (EDC 1/28, based on 17 wk u/s) Patient's last menstrual period was 09/11/2019. Preg complicated by has Obesity in pregnancy; TOBACCO DEPENDENCE; DEPRESSION, MILD; MIGRAINE, UNSPEC., W/O INTRACTABLE MIGRAINE; ECZEMA, ATOPIC; HIDRADENITIS SUPPURATIVA; GERD (gastroesophageal reflux disease); History of VBAC; Alopecia; Acne; Bilateral knee pain; Supervision of high risk pregnancy, antepartum; H/O: substance abuse (HCC); Rubella non-immune status, antepartum; Pre-existing type 2 diabetes mellitus in pregnancy; Uncontrolled type 2 diabetes mellitus without complication, without long-term current use of insulin; Moderate episode of recurrent major depressive disorder (HCC); Acute pain of right knee; BMI 40.0-44.9, adult (HCC); Pregnancy complicated by subutex maintenance, antepartum (HCC); and Late prenatal care on their problem list.   No PTL s/s, +FM  ROS: A 12-point review of systems was performed and negative, except as stated in the above HPI.  OBGYN History: As per HPI. OB History  Gravida Para Term Preterm AB Living  4 3 3     3   SAB TAB Ectopic Multiple Live Births        0 3    # Outcome Date GA Lbr Len/2nd Weight Sex Delivery Anes PTL Lv  4 Current           3 Term 12/15/17 [redacted]w[redacted]d 07:54 / 00:22 7 lb 10.6 oz (3.476 kg) M VBAC EPI  LIV  2 Term 08/18/13 [redacted]w[redacted]d  9 lb 8.7 oz (4.33 kg) M CS-LTranv   LIV  1 Term 07/11/10 [redacted]w[redacted]d  8 lb 13 oz (3.997 kg) M Vag-Spont EPI N LIV     Birth Comments: Shoulder Dystocia    History of pap smears: Yes. Last pap smear 2021 and results were negative   Past Medical History: Past Medical History:  Diagnosis Date  . Left ACL tear 07/2015  . Medial meniscus tear 07/2015   left knee  . Non-insulin dependent type 2 diabetes mellitus (HCC)     oral  . Obesity   . Substance abuse (HCC)    percocet, currently on subutex    Past Surgical History: Past Surgical History:  Procedure Laterality Date  . ANTERIOR CRUCIATE LIGAMENT REPAIR Left 07/23/2015   Procedure: RECONSTRUCTION ANTERIOR CRUCIATE LIGAMENT (ACL) WITH GRAFTLINK HAMSTRING GRAFT;  Surgeon: 07/25/2015, MD;  Location: [facility];  Service: Orthopedics;  Laterality: Left;  . CESAREAN SECTION N/A 08/18/2013   Procedure: CESAREAN SECTION;  Surgeon: 10/16/2013, MD;  Location: [facility];  Service: Obstetrics;  Laterality: N/A;  . EXCISION BONE CYST     gum  . KNEE ARTHROSCOPY WITH MEDIAL MENISECTOMY Left 07/23/2015   Procedure: LEFT KNEE ARTHROSCOPY WITH PARTIAL MEDIAL MENISCECTOMY ;  Surgeon: 07/25/2015, MD;  Location: [facility];  Service: Orthopedics;  Laterality: Left;  . KNEE ARTHROSCOPY WITH MENISCAL REPAIR Left 07/23/2015   Procedure: LATERAL MENISCAL REPAIR;  Surgeon: 07/25/2015, MD;  Location: [facility];  Service: Orthopedics;  Laterality: Left;    Family History:  Family History  Problem Relation Age of Onset  . Asthma Mother   . Diabetes Mother   . Alcohol abuse Mother   . Stroke Mother     Social History:  Social History   Socioeconomic History  . Marital status: Legally Separated    Spouse name: Not on file  . Number of  children: Not on file  . Years of education: Not on file  . Highest education level: Not on file  Occupational History  . Not on file  Tobacco Use  . Smoking status: Current Every Day Smoker    Packs/day: 0.50    Years: 10.00    Pack years: 5.00    Types: Cigarettes  . Smokeless tobacco: Never Used  Vaping Use  . Vaping Use: Never used  Substance and Sexual Activity  . Alcohol use: No  . Drug use: No    Comment: Hx percocet use, on subutex  . Sexual activity: Yes    Birth control/protection: None  Other Topics Concern  . Not on file  Social History Narrative   . Not on file   Social Determinants of Health   Financial Resource Strain:   . Difficulty of Paying Living Expenses: Not on file  Food Insecurity: No Food Insecurity  . Worried About Programme researcher, broadcasting/film/video in the Last Year: Never true  . Ran Out of Food in the Last Year: Never true  Transportation Needs: Unmet Transportation Needs  . Lack of Transportation (Medical): Yes  . Lack of Transportation (Non-Medical): Yes  Physical Activity:   . Days of Exercise per Week: Not on file  . Minutes of Exercise per Session: Not on file  Stress:   . Feeling of Stress : Not on file  Social Connections:   . Frequency of Communication with Friends and Family: Not on file  . Frequency of Social Gatherings with Friends and Family: Not on file  . Attends Religious Services: Not on file  . Active Member of Clubs or Organizations: Not on file  . Attends Banker Meetings: Not on file  . Marital Status: Not on file  Intimate Partner Violence: Not At Risk  . Fear of Current or Ex-Partner: No  . Emotionally Abused: No  . Physically Abused: No  . Sexually Abused: No    Allergy: No Known Allergies  Current Outpatient Medications: None  Physical Exam:   BP 126/81   Pulse (!) 101   Wt 274 lb (124.3 kg)   LMP 09/11/2019   BMI 47.03 kg/m  Body mass index is 47.03 kg/m. Contractions: Not present Vag. Bleeding: None.  General appearance: Well nourished, well developed female in no acute distress.  Cardiovascular: S1, S2 normal, no murmur, rub or gallop, regular rate and rhythm Respiratory:  Clear to auscultation bilateral. Normal respiratory effort Abdomen: obese, gravid, positive bowel sounds and no masses, hernias; diffusely non tender to palpation, non distended Neuro/Psych:  Normal mood and affect.  Skin:  Warm and dry.  Lymphatic:  No inguinal lymphadenopathy.  EGBUS normal  Laboratory: none  Imaging:  Normal growth u/s today  Assessment: pt stable  Plan: 1.  Pre-existing type 2 diabetes mellitus during pregnancy in second trimester S/p neg fetal echo and normal growth u/s today. Pt is not checking her sugars. Supplies and log sheet sent in for patient and request for asap DM education visit made. Importance of glycemic control d/w pt  2. [redacted] weeks gestation of pregnancy  3. Supervision of high risk pregnancy, antepartum Routine care. Ask about BTL papers next visit. Low dose asa sent. F./u cffdna.  - Referral to Nutrition and Diabetes Services - Hemoglobin A1c - Comprehensive metabolic panel - TSH - CBC/D/Plt+RPR+Rh+ABO+Rub Ab... - Protein / creatinine ratio, urine - Culture, OB Urine - Cervicovaginal ancillary only( [place]) - Ambulatory referral to Integrated Behavioral Health  4. BMI  40.0-44.9, adult (HCC)  5. History of VBAC D/w pt re: delivery mode nv. Problem list updated. Should be fine for TOLAC if efw is similar to last pregnancy  6. Late prenatal care - [id number] 11+Oxyco+Alc+Crt-Bund  7. Suboxone maintenance treatment complicating pregnancy, antepartum (HCC) Pt states she was going to a clinic on [address] (pt not sure of name) and was getting 8mg  tid but she states they don't want to take care of her b/c she's pregnant. She states that they gave her a month supply a month ago and that she ran out and is getting some from a friend. I told I don't recommend this and to reach out to the clinic again b/c it is find to receive this in pregnancy. Briefly d/w her re: NAS but still recommend taking during pregnancy. Will set up with a provider in our clinic that is able to prescribe this medication for consideration of getting it through .  [place] 11+Oxyco+Alc+Crt-Bund  Problem list reviewed and updated.  Follow up in 1 weeks.  The nature of [facility] with multiple MDs and other Advanced Practice Providers was explained to patient; also emphasized that residents, students are part of our  team.  >50% of 30 min visit spent on counseling and coordination of care.     [facility] MD Attending Center for [facility])

## 2020-05-17 NOTE — Addendum Note (Signed)
Addended by: Ernestina Patches on: 05/17/2020 05:06 PM   Modules accepted: Orders

## 2020-05-18 LAB — COMPREHENSIVE METABOLIC PANEL
ALT: 8 IU/L (ref 0–32)
AST: 12 IU/L (ref 0–40)
Albumin/Globulin Ratio: 1.2 (ref 1.2–2.2)
Albumin: 3.5 g/dL — ABNORMAL LOW (ref 3.8–4.8)
Alkaline Phosphatase: 79 IU/L (ref 44–121)
BUN/Creatinine Ratio: 22 (ref 9–23)
BUN: 10 mg/dL (ref 6–20)
Bilirubin Total: <0.2 mg/dL (ref 0.0–1.2)
CO2: 21 mmol/L (ref 20–29)
Calcium: 8.6 mg/dL — ABNORMAL LOW (ref 8.7–10.2)
Chloride: 103 mmol/L (ref 96–106)
Creatinine, Ser: 0.45 mg/dL — ABNORMAL LOW (ref 0.57–1.00)
GFR calc Af Amer: 153 mL/min/{1.73_m2} (ref >59)
GFR calc non Af Amer: 133 mL/min/{1.73_m2} (ref >59)
Globulin, Total: 3 g/dL (ref 1.5–4.5)
Glucose: 141 mg/dL — ABNORMAL HIGH (ref 65–99)
Potassium: 4.2 mmol/L (ref 3.5–5.2)
Sodium: 139 mmol/L (ref 134–144)
Total Protein: 6.5 g/dL (ref 6.0–8.5)

## 2020-05-18 LAB — CERVICOVAGINAL ANCILLARY ONLY
Chlamydia: NEGATIVE
Comment: NEGATIVE
Comment: NEGATIVE
Comment: NORMAL
Neisseria Gonorrhea: NEGATIVE
Trichomonas: NEGATIVE

## 2020-05-18 LAB — CBC/D/PLT+RPR+RH+ABO+RUB AB...
Antibody Screen: NEGATIVE
Basophils Absolute: 0 10*3/uL (ref 0.0–0.2)
Basos: 0 %
EOS (ABSOLUTE): 0.2 10*3/uL (ref 0.0–0.4)
Eos: 2 %
HCV Ab: <0.1 s/co ratio (ref 0.0–0.9)
HIV Screen 4th Generation wRfx: NONREACTIVE
Hematocrit: 35.8 % (ref 34.0–46.6)
Hemoglobin: 12.2 g/dL (ref 11.1–15.9)
Hepatitis B Surface Ag: NEGATIVE
Immature Grans (Abs): 0.1 10*3/uL (ref 0.0–0.1)
Immature Granulocytes: 1 %
Lymphocytes Absolute: 2.1 10*3/uL (ref 0.7–3.1)
Lymphs: 20 %
MCH: 30.9 pg (ref 26.6–33.0)
MCHC: 34.1 g/dL (ref 31.5–35.7)
MCV: 91 fL (ref 79–97)
Monocytes Absolute: 0.5 10*3/uL (ref 0.1–0.9)
Monocytes: 5 %
Neutrophils Absolute: 7.3 10*3/uL — ABNORMAL HIGH (ref 1.4–7.0)
Neutrophils: 72 %
Platelets: 242 10*3/uL (ref 150–450)
RBC: 3.95 x10E6/uL (ref 3.77–5.28)
RDW: 11.9 % (ref 11.7–15.4)
RPR Ser Ql: NONREACTIVE
Rh Factor: POSITIVE
Rubella Antibodies, IGG: 11.8 index (ref >0.99)
WBC: 10.2 10*3/uL (ref 3.4–10.8)

## 2020-05-18 LAB — HEMOGLOBIN A1C
Est. average glucose Bld gHb Est-mCnc: 117 mg/dL
Hgb A1c MFr Bld: 5.7 % — ABNORMAL HIGH (ref 4.8–5.6)

## 2020-05-18 LAB — TSH: TSH: 1.15 u[IU]/mL (ref 0.450–4.500)

## 2020-05-18 LAB — HCV INTERPRETATION

## 2020-05-19 NOTE — BH Specialist Note (Deleted)
Integrated Behavioral Health Initial Visit  MRN: [id number] Name: Krystal [name]  Number of Integrated Behavioral Health Clinician visits:: 1/6 Session Start time: ***Sees Pratt at 8:35am; pt available until 10:45am?***  Session End time: *** Total time: {IBH Total Time:21014050}  Type of Service: Integrated Behavioral Health- Individual/Family Interpretor:No. Interpretor Name and Language: n/a   Warm Hand Off Completed.       SUBJECTIVE: Krystal [name] is a 34 y.o. female accompanied by {CHL AMB ACCOMPANIED EK:8003491791} Patient was referred by Lebo Bing, MD for anxiety***. Patient reports the following symptoms/concerns: *** Duration of problem: ***; Severity of problem: {Mild/Moderate/Severe:20260}  OBJECTIVE: Mood: {BHH MOOD:22306} and Affect: {BHH AFFECT:22307} Risk of harm to self or others: {CHL AMB BH Suicide Current Mental Status:21022748}  LIFE CONTEXT: Family and Social: *** School/Work: *** Self-Care: *** Life Changes: Current pregnancy***  GOALS ADDRESSED: Patient will: 1. Reduce symptoms of: {IBH Symptoms:21014056} 2. Increase knowledge and/or ability of: {IBH Patient Tools:21014057}  3. Demonstrate ability to: {IBH Goals:21014053}  INTERVENTIONS: Interventions utilized: {IBH Interventions:21014054}  Standardized Assessments completed: {IBH Screening Tools:21014051}  ASSESSMENT: Patient currently experiencing ***.   Patient may benefit from psychoeducation and brief therapeutic interventions regarding coping with symptoms of *** .  PLAN: 1. Follow up with behavioral health clinician on : *** 2. Behavioral recommendations:  -*** -*** 3. Referral(s): {IBH Referrals:21014055} 4. "From scale of 1-10, how likely are you to follow plan?": ***  Zayli Villafuerte C Julyanna Scholle, LCSW   ***

## 2020-05-24 ENCOUNTER — Encounter: Payer: Medicaid Other | Admitting: Family Medicine

## 2020-05-24 ENCOUNTER — Encounter: Payer: Self-pay | Admitting: Family Medicine

## 2020-05-24 ENCOUNTER — Other Ambulatory Visit: Payer: Self-pay

## 2020-05-24 NOTE — Progress Notes (Signed)
Patient did not keep appointment today. She will be called to reschedule.  

## 2020-05-26 ENCOUNTER — Ambulatory Visit (INDEPENDENT_AMBULATORY_CARE_PROVIDER_SITE_OTHER): Payer: Medicaid Other | Admitting: Clinical

## 2020-05-26 ENCOUNTER — Other Ambulatory Visit: Payer: Self-pay

## 2020-05-26 ENCOUNTER — Encounter: Payer: Self-pay | Admitting: Family Medicine

## 2020-05-26 ENCOUNTER — Encounter: Payer: Medicaid Other | Admitting: Family Medicine

## 2020-05-26 ENCOUNTER — Encounter: Payer: Medicaid Other | Attending: Obstetrics and Gynecology | Admitting: Registered"

## 2020-05-26 DIAGNOSIS — F339 Major depressive disorder, recurrent, unspecified: Secondary | ICD-10-CM

## 2020-05-26 DIAGNOSIS — Z658 Other specified problems related to psychosocial circumstances: Secondary | ICD-10-CM

## 2020-05-26 DIAGNOSIS — F1120 Opioid dependence, uncomplicated: Secondary | ICD-10-CM

## 2020-05-26 NOTE — BH Specialist Note (Signed)
Integrated Behavioral Health via Telemedicine Video (Caregility) Visit  05/26/2020 Krystal [name] [id number]   Patient and/or legal guardian verbally consented to [facility] services about presenting concerns and psychiatric consultation as appropriate.   Number of Integrated Behavioral Health visits: 1 Session Start time: 2:36  Session End time: 3:17 Total time: 41 minutes  Referring Provider: Tinnie Gens, MD Type of Service: Individual Patient/Family location: [facility] Provider location: Center for [employer] [facility]  All persons participating in visit: [name] and [facility] Allisonia   I connected with Krystal [name] by a video enabled telemedicine application (Caregility) and verified that I am speaking with the correct person using two identifiers.   Discussed confidentiality: Yes   Confirmed demographics & insurance:  Yes   I discussed that engaging in this virtual visit, they consent to the provision of behavioral healthcare and the services will be billed under their insurance.   Patient and/or legal guardian expressed understanding and consented to virtual visit: Yes   PRESENTING CONCERNS: Patient and/or family reports the following symptoms/concerns: Pt states her primary concern today is need to start back on Subutex, life stress (going through pregnancy by herself w husband in prison, lack of transportation), history of depression and depression;  pt's goal is a healthy pregnancy.  Duration of problem: Current pregnancy; Severity of problem: moderate  STRENGTHS (Protective Factors/Coping Skills): Open to treatment; open to positive life changes  ASSESSMENT: Patient currently experiencing Major depressive disorder, recurrent, in remission and Psychosocial stress and Pregnancy complicated by subutex maintenance   GOALS ADDRESSED: Patient will: 1.  Reduce symptoms of: anxiety, depression  and stress  2.  Increase knowledge and/or ability of: stress reduction  3.  Demonstrate ability to: Increase healthy adjustment to current life circumstances   Progress of Goals: Ongoing  INTERVENTIONS: Interventions utilized:  Solution-Focused Strategies, Psychoeducation and/or Health Education and Link to [employer] Assessments completed & reviewed: PHQ9/GAD7 given in past two weeks   OUTCOME: Patient Response: Pt agrees to treatment plan   PLAN: 1. Follow up with behavioral health clinician on : Two weeks 2. Behavioral recommendations:  -Discuss starting back on Subutex at medical visit on 05/28/20 -View Virtual Tour of [facility] at [email or web address]  -Consider additional transportation resources (on AVS)  3. Referral(s): Integrated [employer] (In Clinic)  I discussed the assessment and treatment plan with the patient and/or parent/guardian. They were provided an opportunity to ask questions and all were answered. They agreed with the plan and demonstrated an understanding of the instructions.   They were advised to call back or seek an in-person evaluation as appropriate.  I discussed that the purpose of this visit is to provide behavioral health care while limiting exposure to the novel coronavirus.  Discussed there is a possibility of technology failure and discussed alternative modes of communication if that failure occurs.  [name] [facility]  Depression screen [facility] 2/9 05/20/2020 10/08/2019 10/01/2019 05/07/2019 01/23/2019  Decreased Interest 1 0 0 - 0  Down, Depressed, Hopeless - 0 0 0 0  PHQ - 2 Score 1 0 0 0 0  Altered sleeping 2 - - - -  Tired, decreased energy 1 - - - -  Change in appetite 0 - - - -  Feeling bad or failure about yourself  0 - - - -  Trouble concentrating 0 - - - -  Moving slowly or fidgety/restless 0 - - - -  Suicidal thoughts 0 - - - -  PHQ-9 Score 4 - - - -  Some recent data might be hidden    GAD 7 : Generalized Anxiety Score 05/20/2020 11/30/2017 11/16/2017 10/19/2017  Nervous, Anxious, on Edge 0 0 1 0  Control/stop worrying 1 0 1 0  Worry too much - different things 0 0 0 0  Trouble relaxing 1 1 1  0  Restless 0 0 0 0  Easily annoyed or irritable 1 1 0 0  Afraid - awful might happen 0 0 0 0  Total GAD 7 Score 3 2 3  0

## 2020-05-26 NOTE — Patient Instructions (Addendum)
Center for [facility] at [facility] [address] [place], [place] [address] [phone] (main office) [phone] [facility] office)  Www.conehealthybaby.com Scientist, water quality Tour of [facility])   Substance Treatment Options [place]:   EMERGENCY: 911  OPIOID DETOX (and treatment)  See below for opioid-specific detox and treatment programs, and note that area hospitals and emergency departments have LIMITED opioid-detox availability, often only for medical complications. For pregnant patients, only [facility] and [employer].  [facility]--Call [phone] for initial phone assessment, accept most insurance; Medicaid for treatment only(not detox, but will refer Medicaid patients out for detox), ShowDirectories.co.za for information. [address] [place], [place] [address]  [facility] - (Ask your medical provider for a referral, they will not accept self-referrals, they do have program for pregnant patients), located in [place], [place]  [facility] & [place]- Call [phone] accepts both Medicaid and the uninsured www.freedomhouserecovery.org   [facility] - For detox, come in to emergency department at [address] [place], [place] [address]. You will need to be medically cleared first. Please bring all of your medications with you. They do accept pregnant patients for opioid detox.   [facility]- Call [phone] for more information about detox program and initial phone assessment, they accept Medicaid and uninsured only, no pregnancy detox ------------------------------------------------------  MEDICAID patients: Call [facility] at [phone] or www.sandhillscenter.[address] , [address], [place], [place] [address] to find providers who accept Medicaid   Other insurance: Freight forwarder company (number often on back of insurance card) to find out about providers in your area who take your  insurance ------------------------------------------------------  HARM REDUCTION and PREVENTION:   Naloxone- See [facility] below, to obtain Naloxone (for overdose reversals)   [employer] AIDS training and Education Center([employer], [place])  BestTheory.uy is the state-wide HIV/STI/hepatitis C training center for health professionals and advocates in the state of [place]. Professionals interested in training on diseases related to the opioid epidemic should contact the training center.   [facility]/Project [employer] [place] [phone] or [email or web address]@[email or web address] or [email or web address]  [address] [place], [place] [address] Prevention, NOT treatment *Check Partnership for [facility] [place] or Project [employer] of [place] on facebook  Mother-to-Baby [place]: Medications and More during Pregnancy and Breastfeeding- Questions about medications and substances during pregnancy and while breastfeeding, call [phone] or text questions to 3806241523 https://mothertobaby.org  METHADONE, SUBOXONE  [facility] [employer] (ADS) [place] 812-626-6453 or www.adsyes.com [address], [place], [place] [address] * Methadone Program: call [phone], extension 237, Talk to [facility] for an appointment * Non-opioid walk-in on Mondays, Tuesdays, Fridays, 1:30-3:00pm, or call to make an appointment  [facility] [phone]  [address] [place], [place] * Methadone, $14 per day/ Suboxone, $125 per week self-pay (do take [facility]) * First visit walk-in between 5:30am-8:30am, Monday through Friday, with picture ID and Medicaid card(if you have one)  [facility] [phone] Www.eleanorhealth.com  [employer], Medicaid, Medicare, some uninsured  [employer] [phone]  [address], [place], [place] [address]  *Medicaid accepted  [facility]  Treatment Center [phone] to set up appointment ([phone] or www.newseason.com [phone]) [address], [place], [place] [address] * Methadone, $14 per day/ Buprenorphine, $19 per day/ Suboxone, $24 per day *Bring photo ID, must have opiates in system and at least one year of usage prior to admittance to program  [facility] [phone] (Ask to speak to Jaci Carrel) or www.triadbehavioralresources.com  [address], [place], [place] [address] *Accepts most insurance and Medicaid *Locations also in [place], [place], [place] *Some Suboxone treatment  [facility] [phone]  [address], [place], [place] [address] *Call ahead for Suboxone appointment  TREATMENT FACILITIES (No methadone or suboxone) [facility] 913-554-7985 or www.caringservices.org [address] [place], [place] [address] *Naloxone Distribution  *Outpatient and Transitional Housing  [facility] [phone] [address] [place], [place], [place] [address] *Must detox first (see detox locations above) *Requirements: Be uninsured OR have Medicaid, be a [employer] resident, and stay a minimum 30 days for inpatient treatment  [facility]  4025415422 Www.eleanorhealth.com  [facility] [phone] or www.fellowshiphall.com  [address] [place], [place] [address] *Detox, residential, partial hospitalization, intensive outpatient(day/evening), extended treatment, transitional housing, sober living *Call for [employer], no [employer] (769)646-7453 or www.legacyfreedom.com [address] [place], [place] [address] *Call [phone] after hours, or for information about locations in [place], [place], [place], and [place], [place] *No Medicaid or [employer], limited scholarships available  * NOT a 12-step program  [employer]   A supportive community for homeless women in recovery from substance abuse (336) (515)609-4622 or email at maryshousegso@aol .com for more information www.maryshousegso.org   [facility] 252-093-5162 or www.ringercenter.com  [address] [place], [place], [place], [address] Sarita Bottom, MD and Delphia Grates, NP *detox up to 3 months, most insurance and Medicaid  [facility] [phone] or www.roominn.org  *Comprehensive program helping homeless, single, pregnant women (with or without previous children), during pregnancy and after birth  *Must be a [place] resident, at least [redacted] weeks pregnant, with no severe psychiatric history   INDIVIDUAL TREATMENT PROVIDERS Dr. Iver Nestle Book, [address] [place], [place] [address], [phone] Dr. Dennard Schaumann, [address], [place], [place] [address] [phone] Dr. Vickey Huger [place], [address] [place], [place], [place] [address] [phone] Dr Lawerance Bach Stevan Born, [address], [place], [place] [address], [phone] Dr. Roger Shelter A. Jannifer Franklin [address], [place], [place] [address] [phone] Dr. Marcy Salvo, [address] [place], [place] [address]  [phone] Dr. Fredna Dow. Wynonia Lawman [address], [place], [place] [address], [phone]  Dr. Wendi Maya, [address], [place], [place] [address], [phone], MSW,LCSW,LCAS, [address], [place], [place] [address] [phone], Private pay only                                                         SUPPORT GROUPS  Overdose Loss Support Group 863-300-2055 or www.hospicegso.org *Contact Kimberly Grove(737-186-4845 or kgrove@hospicegso .org) or Mary Easton([phone] or measton@hospicegso .org) for date, time, location, and more information about support group  [facility] (657) 212-8916 or FraudPod.com.pt [address] [place], [place]  [address] *Peer support for students and collegiate recovery community, NO TREATMENT, SUPPORT ONLY FOR STUDENTS AND FUTURE STUDENTS  **All information above is for informational purposes only, and is subject to change, please contact agencies and/or providers to find out any updated information  Transportation Resources [place] [employer] (GTA) [address] [place], [place], [place] [address] https://www.[place].gov/departments/transportation/gdot-divisions/[place]-transit-agency-public-transportation-division     .  Fixed-route bus services, including regional fare cards for PART, [place], [place], and WSTA buses.  . Reduced fare bus ID's available for Medicaid, Medicare, and "orange card" recipients.  [place] SCAT offers curb-to-curb and door-to-door bus services for people with disabilities who are unable to use a fixed-bus route; also offers a shared-ride program.   Helpful tips:  -Routes available online and physical maps available at the main bus hub lobby (each for a specific route) -Smartphone directions often include bus routes (see the "bus" icon, next to the "car" and "walk" icons) -Routes differ on weekends, evenings and holidays, so plan ahead!  -If you have Medicaid, Medicare, or orange card, plan to obtain a reduced-fare ID to save 50% on rides. Check days and times to obtain an ID, and bring all necessary documents.   [employer] [place]) [address] [place], [place]. [address] [place], [place] [address] [phone] SpotApps.nl **Fixed-bus route services, and demand response bus service for older adults  [facility] of [facility] [address] [place], [place] [address] [phone] www.MysterySinger.com.cy **Medicaid transportation is available to [place] [facility] recipients who need assistance getting to [facility] medical  appointments and providers  [facility] [address] [place] [address], [place], [place] [address] www.cjmedicaltransportation.com  ** Offers non-emergency transportation for medical appointments  [facility] [address], [place], [place] [address] [phone] www.wheels4hope.org **REFERRAL NEEDED by specific agencies (see website), after meeting specified criteria only  [employer] for [employer]) [address] [place], [place] [address] [phone]  BuyingShow.es  *Regional fixed-bus routes between counties (example: [place] to [place]) and [employer] of [place]  [address], [place], [place] [address] [phone] Http://senior-resources-[place].org  [employer] available (call or see website for details)

## 2020-05-27 ENCOUNTER — Encounter: Payer: Self-pay | Admitting: General Practice

## 2020-05-27 NOTE — Progress Notes (Signed)
Patient did not keep appointment today. She will be called to reschedule.  

## 2020-05-28 ENCOUNTER — Other Ambulatory Visit: Payer: Self-pay

## 2020-05-28 ENCOUNTER — Encounter: Payer: Self-pay | Admitting: Obstetrics and Gynecology

## 2020-05-28 ENCOUNTER — Ambulatory Visit (INDEPENDENT_AMBULATORY_CARE_PROVIDER_SITE_OTHER): Payer: Medicaid Other | Admitting: Obstetrics and Gynecology

## 2020-05-28 VITALS — BP 124/81 | HR 107 | Wt 254.0 lb

## 2020-05-28 DIAGNOSIS — Z23 Encounter for immunization: Secondary | ICD-10-CM | POA: Diagnosis not present

## 2020-05-28 DIAGNOSIS — O99210 Obesity complicating pregnancy, unspecified trimester: Secondary | ICD-10-CM

## 2020-05-28 DIAGNOSIS — O0990 Supervision of high risk pregnancy, unspecified, unspecified trimester: Secondary | ICD-10-CM | POA: Diagnosis not present

## 2020-05-28 DIAGNOSIS — Z98891 History of uterine scar from previous surgery: Secondary | ICD-10-CM

## 2020-05-28 DIAGNOSIS — O24113 Pre-existing diabetes mellitus, type 2, in pregnancy, third trimester: Secondary | ICD-10-CM

## 2020-05-28 DIAGNOSIS — F1911 Other psychoactive substance abuse, in remission: Secondary | ICD-10-CM

## 2020-05-28 MED ORDER — FLUOXETINE HCL 20 MG PO CAPS: 20.0000 mg | ORAL_CAPSULE | Freq: Every day | ORAL | 3 refills | Status: DC

## 2020-05-28 MED ORDER — PANTOPRAZOLE SODIUM 40 MG PO TBEC: 40.0000 mg | DELAYED_RELEASE_TABLET | Freq: Every day | ORAL | 3 refills | Status: DC

## 2020-05-28 MED ORDER — BUPRENORPHINE HCL-NALOXONE HCL 8-2 MG SL FILM
8.0000 mg | ORAL_FILM | Freq: Three times a day (TID) | SUBLINGUAL | 0 refills | Status: DC
Start: 2020-05-28 — End: 2020-06-15

## 2020-05-28 NOTE — Progress Notes (Signed)
   PRENATAL VISIT NOTE  Subjective:  Krystal [name] is a 34 y.o. [phone] at [redacted]w[redacted]d being seen today for ongoing prenatal care.  She is currently monitored for the following issues for this high-risk pregnancy and has Obesity in pregnancy; TOBACCO DEPENDENCE; DEPRESSION, MILD; MIGRAINE, UNSPEC., W/O INTRACTABLE MIGRAINE; ECZEMA, ATOPIC; HIDRADENITIS SUPPURATIVA; GERD (gastroesophageal reflux disease); History of VBAC; Alopecia; Acne; Bilateral knee pain; Supervision of high risk pregnancy, antepartum; H/O: substance abuse (HCC); Rubella non-immune status, antepartum; Pre-existing type 2 diabetes mellitus in pregnancy; Uncontrolled type 2 diabetes mellitus without complication, without long-term current use of insulin; Moderate episode of recurrent major depressive disorder (HCC); Acute pain of right knee; BMI 40.0-44.9, adult (HCC); Pregnancy complicated by subutex maintenance, antepartum (HCC); and Late prenatal care on their problem list.  Patient reports no complaints.  Contractions: Not present. Vag. Bleeding: None.  Movement: Present. Denies leaking of fluid.   The following portions of the patient's history were reviewed and updated as appropriate: allergies, current medications, past family history, past medical history, past social history, past surgical history and problem list.   Objective:   Vitals:   05/28/20 1014  BP: 124/81  Pulse: (!) 107  Weight: 254 lb (115.2 kg)    Fetal Status: Fetal Heart Rate (bpm): 132 Fundal Height: 27 cm Movement: Present     General:  Alert, oriented and cooperative. Patient is in no acute distress.  Skin: Skin is warm and dry. No rash noted.   Cardiovascular: Normal heart rate noted  Respiratory: Normal respiratory effort, no problems with respiration noted  Abdomen: Soft, gravid, appropriate for gestational age.  Pain/Pressure: Absent     Pelvic: Cervical exam deferred        Extremities: Normal range of motion.  Edema: Trace  Mental Status:  Normal mood and affect. Normal behavior. Normal judgment and thought content.   Assessment and Plan:  Pregnancy: G4P3003 at [redacted]w[redacted]d 1. Supervision of high risk pregnancy, antepartum Patient is doing well - Tdap vaccine greater than or equal to 7yo IM - Flu Vaccine QUAD 36+ mos IM  2. Pre-existing type 2 diabetes mellitus during pregnancy in third trimester Patient has not picked up testing supplies Follow up growth ultrasound scheduled  3. History of VBAC Sign consent at next visit  4. Obesity in pregnancy   5. H/O: substance abuse (HCC) Rx suboxone provided Patient also admits to a history of depression and was previously on prozac. Patient desires to be restarted on that as well Patient is being followed by intergrated behavioral health  Preterm labor symptoms and general obstetric precautions including but not limited to vaginal bleeding, contractions, leaking of fluid and fetal movement were reviewed in detail with the patient. Please refer to After Visit Summary for other counseling recommendations.   Return in about 2 years (around 05/28/2022) for in person, ROB, High risk.  Future Appointments  Date Time Provider Department Center  06/09/2020 10:45 AM [facility] HEALTH CLINICIAN [facility]  06/16/2020 10:30 AM [facility] NURSE [facility]  06/16/2020 10:45 AM [facility] US4 [facility]    Catalina Antigua, MD

## 2020-05-28 NOTE — BH Specialist Note (Signed)
error 

## 2020-05-31 NOTE — BH Specialist Note (Signed)
Error

## 2020-06-01 ENCOUNTER — Encounter: Payer: Self-pay | Admitting: General Practice

## 2020-06-09 ENCOUNTER — Encounter: Payer: Self-pay | Admitting: Family Medicine

## 2020-06-09 ENCOUNTER — Encounter: Payer: Medicaid Other | Admitting: Family Medicine

## 2020-06-09 ENCOUNTER — Ambulatory Visit: Payer: Medicaid Other | Admitting: Clinical

## 2020-06-09 ENCOUNTER — Telehealth: Payer: Self-pay | Admitting: Family Medicine

## 2020-06-09 DIAGNOSIS — Z91199 Patient's noncompliance with other medical treatment and regimen due to unspecified reason: Secondary | ICD-10-CM

## 2020-06-09 DIAGNOSIS — Z5329 Procedure and treatment not carried out because of patient's decision for other reasons: Secondary | ICD-10-CM

## 2020-06-09 NOTE — Progress Notes (Signed)
Patient did not keep appointment today. She will be called to reschedule.  

## 2020-06-09 NOTE — Progress Notes (Signed)
Integrated Behavioral Health via Telemedicine Video (Caregility) Visit Pt did not arrive to video visit and did not answer the phone at 10:45 or 11:00; left MyChart message for patient.     Bea Graff (Supervisor: Hulda Marin)

## 2020-06-09 NOTE — Telephone Encounter (Signed)
Attempted to reach pt via telephone, was not able to leave a voicemail per auto line states-call cannot be reached at this time, I tried both numbers 3 times.

## 2020-06-11 ENCOUNTER — Telehealth: Payer: Self-pay | Admitting: Family Medicine

## 2020-06-11 NOTE — Telephone Encounter (Signed)
Pt called in and states she missed her appointment due to death in family(mother). I advised pt of her new appt. Pt also states she need a refill Suboxone today

## 2020-06-14 NOTE — Telephone Encounter (Signed)
Pt returned call requesting a refill on her suboxone.  Message routed to Dr. Jolayne Panther for recommendation.    Krystal [name]

## 2020-06-14 NOTE — Telephone Encounter (Signed)
Attempted to call patient at the requested number of please call [phone] and left voicemail message to please give the office a call back if she continues to have questions or concern.    Addison Naegeli, RN

## 2020-06-15 ENCOUNTER — Other Ambulatory Visit: Payer: Self-pay | Admitting: Obstetrics and Gynecology

## 2020-06-15 MED ORDER — BUPRENORPHINE HCL-NALOXONE HCL 8-2 MG SL FILM
8.0000 mg | ORAL_FILM | Freq: Three times a day (TID) | SUBLINGUAL | 0 refills | Status: DC
Start: 2020-06-15 — End: 2020-06-29

## 2020-06-15 NOTE — Telephone Encounter (Signed)
Patient notified via telephone that her suboxone has been refilled.  Pt stated thank you with no further questions.   Addison Naegeli, RN

## 2020-06-16 ENCOUNTER — Encounter: Payer: Self-pay | Admitting: *Deleted

## 2020-06-16 ENCOUNTER — Other Ambulatory Visit: Payer: Self-pay | Admitting: *Deleted

## 2020-06-16 ENCOUNTER — Ambulatory Visit: Payer: Medicaid Other | Attending: Obstetrics and Gynecology

## 2020-06-16 ENCOUNTER — Ambulatory Visit: Payer: Medicaid Other | Admitting: *Deleted

## 2020-06-16 ENCOUNTER — Other Ambulatory Visit: Payer: Self-pay

## 2020-06-16 VITALS — BP 127/61 | HR 98

## 2020-06-16 DIAGNOSIS — O34219 Maternal care for unspecified type scar from previous cesarean delivery: Secondary | ICD-10-CM

## 2020-06-16 DIAGNOSIS — O99323 Drug use complicating pregnancy, third trimester: Secondary | ICD-10-CM

## 2020-06-16 DIAGNOSIS — Z3A28 28 weeks gestation of pregnancy: Secondary | ICD-10-CM

## 2020-06-16 DIAGNOSIS — E669 Obesity, unspecified: Secondary | ICD-10-CM | POA: Diagnosis not present

## 2020-06-16 DIAGNOSIS — O99212 Obesity complicating pregnancy, second trimester: Secondary | ICD-10-CM

## 2020-06-16 DIAGNOSIS — O0933 Supervision of pregnancy with insufficient antenatal care, third trimester: Secondary | ICD-10-CM

## 2020-06-16 DIAGNOSIS — F1120 Opioid dependence, uncomplicated: Secondary | ICD-10-CM | POA: Insufficient documentation

## 2020-06-16 DIAGNOSIS — O24113 Pre-existing diabetes mellitus, type 2, in pregnancy, third trimester: Secondary | ICD-10-CM

## 2020-06-16 DIAGNOSIS — O24312 Unspecified pre-existing diabetes mellitus in pregnancy, second trimester: Secondary | ICD-10-CM | POA: Diagnosis not present

## 2020-06-16 DIAGNOSIS — Z362 Encounter for other antenatal screening follow-up: Secondary | ICD-10-CM | POA: Diagnosis not present

## 2020-06-16 DIAGNOSIS — Z8632 Personal history of gestational diabetes: Secondary | ICD-10-CM

## 2020-06-16 DIAGNOSIS — O99320 Drug use complicating pregnancy, unspecified trimester: Secondary | ICD-10-CM | POA: Diagnosis not present

## 2020-06-21 NOTE — BH Specialist Note (Signed)
Integrated Behavioral Health via Telemedicine Video (Caregility) Visit  06/21/2020 KELCEE BJORN [id number]  Number of Integrated Behavioral Health visits: 2 Session Start time: 2:13 Session End time: 2:29 Total time: 16 minutes  Referring Provider: Fortuna Bing, MD Type of Service: Individual Patient/Family location: [facility] Provider location: Center for [employer] [facility] at [facility]  All persons participating in visit: Patient Prisca Gearing and [facility] [place]   I connected with [name]  by a video enabled telemedicine application (Caregility) and verified that I am speaking with the correct person using two identifiers.   Discussed confidentiality: Yes   Confirmed demographics & insurance:  Yes   I discussed that engaging in this virtual visit, they consent to the provision of behavioral healthcare and the services will be billed under their insurance.   Patient and/or legal guardian expressed understanding and consented to virtual visit: Yes   PRESENTING CONCERNS: Patient and/or family reports the following symptoms/concerns: Pt states her primary goal is  to have a healthy pregnancy, including emotional wellness in the midst of life stress; some stress lifted as her husband home for his mother's funeral, and will likely be released from prison early; pt looks forward to a new start for her family.  Duration of problem: Ongoing; Severity of problem: moderate  STRENGTHS (Protective Factors/Coping Skills): Adhering to treatment; motivated for positive life changes  ASSESSMENT: Patient currently experiencing Major depressive disorder, recurrent, in remission, Pregnancy complicated by subutex maintenance and Psychosocial stress.    GOALS ADDRESSED: Patient will: 1.  Maintain reduction of  symptoms of: anxiety, depression and stress   Progress of Goals: Ongoing  INTERVENTIONS: Interventions utilized:  Supportive Counseling,  Psychoeducation and/or Health Education and Link to [employer] Standardized Assessments completed & reviewed: GAD-7 and PHQ 9   OUTCOME: Patient Response: Pt agrees to revised treatment plan   PLAN: 1. Follow up with behavioral health clinician on : Three weeks 2. Behavioral recommendations:  -Accept WIC referral today -Continue taking Prozac and Suboxone as prescribed -Continue taking prenatal vitamin daily -Continue with plan to view Virtual Tour of [facility] at [email or web address] -Read through Postpartum Planner on After Visit Summary 3. Referral(s): Integrated [employer] (In Clinic)  I discussed the assessment and treatment plan with the patient and/or parent/guardian. They were provided an opportunity to ask questions and all were answered. They agreed with the plan and demonstrated an understanding of the instructions.   They were advised to call back or seek an in-person evaluation as appropriate.  I discussed that the purpose of this visit is to provide behavioral health care while limiting exposure to the novel coronavirus.  Discussed there is a possibility of technology failure and discussed alternative modes of communication if that failure occurs.  [name] [facility]  Depression screen [facility] 2/9 06/24/2020 06/24/2020 05/28/2020 05/20/2020 10/08/2019  Decreased Interest 0 0 0 1 0  Down, Depressed, Hopeless 0 0 1 - 0  PHQ - 2 Score 0 0 1 1 0  Altered sleeping 1 1 1 2  -  Tired, decreased energy 3 1 1 1  -  Change in appetite 0 0 0 0 -  Feeling bad or failure about yourself  0 0 0 0 -  Trouble concentrating 0 0 1 0 -  Moving slowly or fidgety/restless 0 0 0 0 -  Suicidal thoughts 0 0 0 0 -  PHQ-9 Score 4 2 4 4  -  Some recent data might be hidden   GAD 7 :  Generalized Anxiety Score 06/24/2020 06/24/2020 05/28/2020 05/20/2020  Nervous, Anxious, on Edge 0 0 0 0  Control/stop worrying 0 0 1 1  Worry too much - different things 1 0 1 0   Trouble relaxing 0 0 1 1  Restless 0 0 0 0  Easily annoyed or irritable 1 0 1 1  Afraid - awful might happen 0 0 0 0  Total GAD 7 Score 2 0 4 3

## 2020-06-22 NOTE — BH Specialist Note (Addendum)
Integrated Behavioral Health via Telemedicine Video (Caregility) Visit  06/22/2020 Krystal [name] [id number]  Number of Integrated Behavioral Health visits: 2 Session Start time: 9:35  Session End time: 9:29 Total time: 4 minutes  Less than 5 minute visit; Pt requests refill Suboxone and f/u visit 07/13/20.   Pt is at home; [facility] [name] is at [employer] for [employer] at [facility]   [name] [facility]

## 2020-06-23 NOTE — BH Specialist Note (Addendum)
Integrated Behavioral Health Treatment Planning Team  MRN: [id number] NAME: Krystal [name]  DATE: 06/23/20  Start time: 12:00 End time: 12:25 Total time: 20 Total number of Virtual [place] Treatment Team Plan encounters: 1/4  Treatment Team Attendees: Norman Clay, MD; Vesta Mixer, LCSW  Diagnoses: Recurrent major depressive disorder, remission status unspecified, Psychosocial stress; Pregnancy complicated by subutex maintenance  Goals, Interventions and Follow-up Plan Goals: Patient will: Increase healthy adjustment to current life circumstances and reduce symptoms of: anxiety depression stress and also Increase knowledge and/or ability of:  stress reduction Interventions: Solution-Focused Strategies Psychoeducation and/or Health Education Link to [employer]  Standardized Assessments Completed: PHQ9/GAD7 given in past two weeks Medication Management Recommendations: Continue Prozac at current dose Follow up Plan: Follow up with [facility] on 06/24/20 Referral(s): [place] (In Clinic)  History of the present illness Presenting Problem/Current Symptoms: All mild: depression, fatigue, sleep difficulty, lack of concentration, worry, difficulty relaxing, irritability Notable from MMSE: n/a  Screenings PHQ-9 Assessments:  Depression screen [facility] 2/9 05/28/2020 05/20/2020 10/08/2019  Decreased Interest 0 1 0  Down, Depressed, Hopeless 1 - 0  PHQ - 2 Score 1 1 0  Altered sleeping 1 2 -  Tired, decreased energy 1 1 -  Change in appetite 0 0 -  Feeling bad or failure about yourself  0 0 -  Trouble concentrating 1 0 -  Moving slowly or fidgety/restless 0 0 -  Suicidal thoughts 0 0 -  PHQ-9 Score 4 4 -  Some recent data might be hidden   GAD-7 Assessments:  GAD 7 : Generalized Anxiety Score 05/28/2020 05/20/2020 11/30/2017 11/16/2017  Nervous, Anxious, on Edge 0 0 0 1  Control/stop worrying 1 1 0 1  Worry too much - different things 1 0 0 0  Trouble  relaxing _0 Restless 0 0 0 0  Easily annoyed or irritable _1 0  Afraid - awful might happen 0 0 0 0  Total GAD 7 Score _2 Psychiatric History  Depression: Yes Anxiety: Yes Mania: No Psychosis: No PTSD symptoms: No Psychiatric History: has previously been in counseling  Past Psychiatric History/Hospitalization(s): Hospitalization for psychiatric illness: No Prior Suicide Attempts: No Prior Self-injurious behavior: No Previous Treatment: Psychiatry; Group counseling [facility] for Substance use disorder  Treatment Barriers: Transportation Strengths/Protective Factors: Adhering to treatment, motivated for change  Demographics/Context: 34yo white female  Social History:  Social History   Socioeconomic History  . Marital status: Legally Separated    Spouse name: Not on file  . Number of children: Not on file  . Years of education: Not on file  . Highest education level: Not on file  Occupational History  . Not on file  Tobacco Use  . Smoking status: Current Every Day Smoker    Packs/day: 0.50    Years: 10.00    Pack years: 5.00    Types: Cigarettes  . Smokeless tobacco: Never Used  Vaping Use  . Vaping Use: Never used  Substance and Sexual Activity  . Alcohol use: No  . Drug use: No    Comment: Hx percocet use, on subutex  . Sexual activity: Yes    Birth control/protection: None  Other Topics Concern  . Not on file  Social History Narrative  . Not on file   Social Determinants of Health   Financial Resource Strain:   . Difficulty of Paying Living Expenses: Not on file  Food Insecurity: No Food Insecurity  .  Worried About Charity fundraiser in the Last Year: Never true  . Ran Out of Food in the Last Year: Never true  Transportation Needs: Unmet Transportation Needs  . Lack of Transportation (Medical): Yes  . Lack of Transportation (Non-Medical): Yes  Physical Activity:   . Days of Exercise per Week: Not on file  . Minutes of Exercise  per Session: Not on file  Stress:   . Feeling of Stress : Not on file  Social Connections:   . Frequency of Communication with Friends and Family: Not on file  . Frequency of Social Gatherings with Friends and Family: Not on file  . Attends Religious Services: Not on file  . Active Member of Clubs or Organizations: Not on file  . Attends Archivist Meetings: Not on file  . Marital Status: Not on file   School/Education: - Testing: -  Past Medical History Medical History:  Past Medical History:  Diagnosis Date  . Left ACL tear 07/2015  . Medial meniscus tear 07/2015   left knee  . Non-insulin dependent type 2 diabetes mellitus (HCC)    oral  . Obesity   . Substance abuse ([place])    percocet, currently on subutex   Allergies:  Allergies as of 06/24/2020  . (No Known Allergies)   Labs:  Recent Results (from the past 2160 hour(s))  Cervicovaginal ancillary only( [place])     Status: None   Collection Time: 05/17/20  3:11 PM  Result Value Ref Range   Neisseria Gonorrhea Negative    Chlamydia Negative    Trichomonas Negative    Comment Normal Reference [place] Chlamydia - Negative    Comment      Normal Reference Range Neisseria Gonorrhea - Negative   Comment Normal Reference Range Trichomonas - Negative   Hemoglobin A1c     Status: Abnormal   Collection Time: 05/17/20  4:45 PM  Result Value Ref Range   Hgb A1c MFr Bld 5.7 (H) 4.8 - 5.6 %    Comment:          Prediabetes: 5.7 - 6.4          Diabetes: >6.4          Glycemic control for adults with diabetes: <7.0    Est. average glucose Bld gHb Est-mCnc 117 mg/dL  Comprehensive metabolic panel     Status: Abnormal   Collection Time: 05/17/20  4:45 PM  Result Value Ref Range   Glucose 141 (H) 65 - 99 mg/dL   BUN 10 6 - 20 mg/dL   Creatinine, Ser 0.45 (L) 0.57 - 1.00 mg/dL   GFR calc non Af Amer 133 >59 mL/min/1.73   GFR calc Af Amer 153 >59 mL/min/1.73    Comment: **[facility] currently reports eGFR in  compliance with the current**   recommendations of the [employer]. [facility] will   update reporting as new guidelines are published from the NKF-ASN   Task force.    BUN/Creatinine Ratio 22 9 - 23   Sodium 139 134 - 144 mmol/L   Potassium 4.2 3.5 - 5.2 mmol/L   Chloride 103 96 - 106 mmol/L   CO2 21 20 - 29 mmol/L   Calcium 8.6 (L) 8.7 - 10.2 mg/dL   Total Protein 6.5 6.0 - 8.5 g/dL   Albumin 3.5 (L) 3.8 - 4.8 g/dL   Globulin, Total 3.0 1.5 - 4.5 g/dL   Albumin/Globulin Ratio 1.2 1.2 - 2.2   Bilirubin Total <0.2 0.0 -  1.2 mg/dL   Alkaline Phosphatase 79 44 - 121 IU/L    Comment:               **Please note reference interval change**   AST 12 0 - 40 IU/L   ALT 8 0 - 32 IU/L  TSH     Status: None   Collection Time: 05/17/20  4:45 PM  Result Value Ref Range   TSH 1.150 0.450 - 4.500 uIU/mL  CBC/D/Plt+RPR+Rh+ABO+Rub Ab...     Status: Abnormal   Collection Time: 05/17/20  4:45 PM  Result Value Ref Range   Hepatitis B Surface Ag Negative Negative   HCV Ab <0.1 0.0 - 0.9 s/co ratio   RPR Ser Ql Non Reactive Non Reactive   Rubella Antibodies, IGG 11.80 Immune >0.99 index    Comment:                                 Non-immune       <0.90                                 Equivocal  0.90 - 0.99                                 Immune           >0.99    ABO Grouping A    Rh Factor Positive     Comment: Please note: Prior records for this patient's ABO / Rh type are not available for additional verification.    Antibody Screen Negative Negative   HIV Screen 4th Generation wRfx Non Reactive Non Reactive   WBC 10.2 3.4 - 10.8 x10E3/uL   RBC 3.95 3.77 - 5.28 x10E6/uL   Hemoglobin 12.2 11.1 - 15.9 g/dL   Hematocrit 35.8 34.0 - 46.6 %   MCV 91 79 - 97 fL   MCH 30.9 26.6 - 33.0 pg   MCHC 34.1 31 - 35 g/dL   RDW 11.9 11.7 - 15.4 %   Platelets 242 150 - 450 x10E3/uL   Neutrophils 72 Not Estab. %   Lymphs 20 Not Estab. %   Monocytes 5 Not Estab. %   Eos 2 Not Estab. %    Basos 0 Not Estab. %   Neutrophils Absolute 7.3 (H) 1.40 - 7.00 x10E3/uL   Lymphocytes Absolute 2.1 0 - 3 x10E3/uL   Monocytes Absolute 0.5 0 - 0 x10E3/uL   EOS (ABSOLUTE) 0.2 0.0 - 0.4 x10E3/uL   Basophils Absolute 0.0 0 - 0 x10E3/uL   Immature Granulocytes 1 Not Estab. %   Immature Grans (Abs) 0.1 0.0 - 0.1 x10E3/uL  Interpretation:     Status: None   Collection Time: 05/17/20  4:45 PM  Result Value Ref Range   HCV Interp 1: Comment     Comment: Negative Not infected with HCV, unless recent infection is suspected or other evidence exists to indicate HCV infection.    Procedures: n/a  Medication History: Current medications:  Outpatient Encounter Medications as of 06/24/2020  Medication Sig  . Accu-Chek Softclix Lancets lancets Use as instructed (Patient not taking: Reported on 05/28/2020)  . acetaminophen (TYLENOL) 500 MG tablet Take 1,000 mg by mouth every 6 (six) hours as needed for moderate pain.  Marland Kitchen aspirin EC 81 MG tablet Take  1 tablet (81 mg total) by mouth daily.  . Blood Glucose Monitoring Suppl (ACCU-CHEK GUIDE) w/Device KIT 1 Device by Does not apply route daily. Use as directed (Patient not taking: Reported on 05/28/2020)  . buprenorphine (SUBUTEX) 8 MG SUBL SL tablet DISSOLVE two AND one half tablets UNDER THE TONGUE daily AS DIRECTED (Patient not taking: Reported on 05/17/2020)  . Buprenorphine HCl-Naloxone HCl (SUBOXONE) 8-2 MG FILM Place 1 Film under the tongue 3 (three) times daily.  Marland Kitchen FLUoxetine (PROZAC) 20 MG capsule Take 1 capsule (20 mg total) by mouth daily.  . pantoprazole (PROTONIX) 40 MG tablet Take 1 tablet (40 mg total) by mouth daily.  . Prenatal Vit-Fe Fumarate-FA (PREPLUS) 27-1 MG TABS Take 1 tablet by mouth daily.   No facility-administered encounter medications on file as of 06/24/2020.      Scribe for Treatment Team: Garlan Fair, LCSW

## 2020-06-24 ENCOUNTER — Ambulatory Visit (INDEPENDENT_AMBULATORY_CARE_PROVIDER_SITE_OTHER): Payer: Medicaid Other | Admitting: Obstetrics and Gynecology

## 2020-06-24 ENCOUNTER — Encounter: Payer: Self-pay | Admitting: Obstetrics and Gynecology

## 2020-06-24 ENCOUNTER — Ambulatory Visit (INDEPENDENT_AMBULATORY_CARE_PROVIDER_SITE_OTHER): Payer: Medicaid Other | Admitting: Clinical

## 2020-06-24 ENCOUNTER — Other Ambulatory Visit: Payer: Self-pay

## 2020-06-24 VITALS — BP 110/71 | HR 114 | Wt 255.8 lb

## 2020-06-24 DIAGNOSIS — Z658 Other specified problems related to psychosocial circumstances: Secondary | ICD-10-CM

## 2020-06-24 DIAGNOSIS — O24113 Pre-existing diabetes mellitus, type 2, in pregnancy, third trimester: Secondary | ICD-10-CM

## 2020-06-24 DIAGNOSIS — O0990 Supervision of high risk pregnancy, unspecified, unspecified trimester: Secondary | ICD-10-CM

## 2020-06-24 DIAGNOSIS — F1120 Opioid dependence, uncomplicated: Secondary | ICD-10-CM

## 2020-06-24 DIAGNOSIS — Z98891 History of uterine scar from previous surgery: Secondary | ICD-10-CM

## 2020-06-24 DIAGNOSIS — O99320 Drug use complicating pregnancy, unspecified trimester: Secondary | ICD-10-CM | POA: Diagnosis not present

## 2020-06-24 DIAGNOSIS — F339 Major depressive disorder, recurrent, unspecified: Secondary | ICD-10-CM | POA: Diagnosis not present

## 2020-06-24 LAB — POCT URINALYSIS DIP (DEVICE)
Bilirubin Urine: NEGATIVE
Glucose, UA: NEGATIVE mg/dL
Hgb urine dipstick: NEGATIVE
Ketones, ur: NEGATIVE mg/dL
Leukocytes,Ua: NEGATIVE
Nitrite: NEGATIVE
Protein, ur: NEGATIVE mg/dL
Specific Gravity, Urine: 1.025 (ref 1.005–1.030)
Urobilinogen, UA: 1 mg/dL (ref 0.0–1.0)
pH: 7.5 (ref 5.0–8.0)

## 2020-06-24 MED ORDER — BLOOD PRESSURE KIT DEVI: 1.0000 | Freq: Once | 0 refills | Status: AC

## 2020-06-24 NOTE — Progress Notes (Signed)
   PRENATAL VISIT NOTE  Subjective:  Krystal [name] is a 34 y.o. [phone] at 56w6dbeing seen today for ongoing prenatal care.  She is currently monitored for the following issues for this high-risk pregnancy and has Obesity in pregnancy; TOBACCO DEPENDENCE; DEPRESSION, MILD; MIGRAINE, UNSPEC., W/O INTRACTABLE MIGRAINE; ECZEMA, ATOPIC; HIDRADENITIS SUPPURATIVA; GERD (gastroesophageal reflux disease); History of VBAC; Alopecia; Acne; Bilateral knee pain; Supervision of high risk pregnancy, antepartum; H/O: substance abuse ([place]; Rubella non-immune status, antepartum; Pre-existing type 2 diabetes mellitus in pregnancy; Uncontrolled type 2 diabetes mellitus without complication, without long-term current use of insulin; Moderate episode of recurrent major depressive disorder ([place]; Acute pain of right knee; BMI 40.0-44.9, adult ([place]; Pregnancy complicated by subutex maintenance, antepartum ([place]; and Late prenatal care on their problem list.  Patient reports no complaints.  Contractions: Not present. Vag. Bleeding: None.  Movement: Present. Denies leaking of fluid.   The following portions of the patient's history were reviewed and updated as appropriate: allergies, current medications, past family history, past medical history, past social history, past surgical history and problem list.   Objective:   Vitals:   06/24/20 0958  BP: 110/71  Pulse: (!) 114  Weight: 255 lb 12.8 oz (116 kg)    Fetal Status: Fetal Heart Rate (bpm): 145 Fundal Height: 31 cm Movement: Present     General:  Alert, oriented and cooperative. Patient is in no acute distress.  Skin: Skin is warm and dry. No rash noted.   Cardiovascular: Normal heart rate noted  Respiratory: Normal respiratory effort, no problems with respiration noted  Abdomen: Soft, gravid, appropriate for gestational age.  Pain/Pressure: Absent     Pelvic: Cervical exam deferred        Extremities: Normal range of motion.  Edema: Trace  Mental  Status: Normal mood and affect. Normal behavior. Normal judgment and thought content.   Assessment and Plan:  Pregnancy: G4P3003 at 266w6d. Supervision of high risk pregnancy, antepartum Patient is doing well without complaints Third trimester labs today - Blood Pressure Monitoring (BLOOD PRESSURE KIT) DEVI; 1 Device by Does not apply route once for 1 dose.  Dispense: 1 each; Refill: 0 - Hemoglobin A1c - CBC - HIV Antibody (routine testing w rflx) - RPR  2. Pre-existing type 2 diabetes mellitus during pregnancy in third trimester Patient did not bring CBG log She reports fasting as high as 90 and 1 pp as high as 200 with most values in the 120's A1c today Follow up ultrasound scheduled  3. History of VBAC Patient desires TOLAC- consent signed today  4. Pregnancy complicated by subutex maintenance, antepartum (HCButlerPatient due for refill next week  Preterm labor symptoms and general obstetric precautions including but not limited to vaginal bleeding, contractions, leaking of fluid and fetal movement were reviewed in detail with the patient. Please refer to After Visit Summary for other counseling recommendations.   No follow-ups on file.  Future Appointments  Date Time Provider [facility]11/18/2021  2:15 PM [facility]11/23/2021  9:15 AM [facility]IOR[facility] CLINICIAN [facility]/02/2020  9:30 AM [facility] NURSE [facility]02/[facility]  9:45 AM W[facility]-MFC US4 [facility] [place]  Pe[place]ora BellmanMD

## 2020-06-25 LAB — CBC
Hematocrit: 36.5 % (ref 34.0–46.6)
Hemoglobin: 12.2 g/dL (ref 11.1–15.9)
MCH: 29.8 pg (ref 26.6–33.0)
MCHC: 33.4 g/dL (ref 31.5–35.7)
MCV: 89 fL (ref 79–97)
Platelets: 243 10*3/uL (ref 150–450)
RBC: 4.09 x10E6/uL (ref 3.77–5.28)
RDW: 11.8 % (ref 11.7–15.4)
WBC: 8.7 10*3/uL (ref 3.4–10.8)

## 2020-06-25 LAB — RPR: RPR Ser Ql: NONREACTIVE

## 2020-06-25 LAB — HEMOGLOBIN A1C
Est. average glucose Bld gHb Est-mCnc: 134 mg/dL
Hgb A1c MFr Bld: 6.3 % — ABNORMAL HIGH (ref 4.8–5.6)

## 2020-06-25 LAB — HIV ANTIBODY (ROUTINE TESTING W REFLEX): HIV Screen 4th Generation wRfx: NONREACTIVE

## 2020-06-29 ENCOUNTER — Ambulatory Visit (INDEPENDENT_AMBULATORY_CARE_PROVIDER_SITE_OTHER): Payer: Medicaid Other | Admitting: Clinical

## 2020-06-29 DIAGNOSIS — F1120 Opioid dependence, uncomplicated: Secondary | ICD-10-CM

## 2020-06-29 DIAGNOSIS — F339 Major depressive disorder, recurrent, unspecified: Secondary | ICD-10-CM

## 2020-06-29 DIAGNOSIS — Z658 Other specified problems related to psychosocial circumstances: Secondary | ICD-10-CM

## 2020-06-29 DIAGNOSIS — O0990 Supervision of high risk pregnancy, unspecified, unspecified trimester: Secondary | ICD-10-CM | POA: Diagnosis not present

## 2020-06-29 MED ORDER — BUPRENORPHINE HCL-NALOXONE HCL 8-2 MG SL FILM: 8.0000 mg | ORAL_FILM | Freq: Three times a day (TID) | SUBLINGUAL | 0 refills | Status: DC

## 2020-06-29 NOTE — BH Specialist Note (Addendum)
Integrated Behavioral Health via Telemedicine Visit  06/29/2020 [name] [id number]  Number of Integrated Behavioral Health visits: 3 (w 2nd visit less than 15 minutes) Session Start time: 2:06  Session End time: 2:10 Total time: 4  Referring Provider: Irene Bing, MD Patient/Family location: Home [facility] Provider location: [facility]  All persons participating in visit: [name] and [facility] Sienna Stonehocker    Pt states she is working with "someone from the Health Department" to access resources for help with children's gifts for Christmas, is reassured after ultrasound that baby is growing well, and  is taking Prozac and Suboxone as prescribed. Pt has a ride picking her up right now to take her to pick up medication; pt agrees to contact Hinda Lindor at [phone] or front desk at [phone] to schedule follow up visit as needed. Pt feels she is coping well right now, and has no further questions or concerns at this time.   Rae Lips, LCSW   Depression screen [facility] 2/9 06/24/2020 06/24/2020 05/28/2020 05/20/2020 10/08/2019  Decreased Interest 0 0 0 1 0  Down, Depressed, Hopeless 0 0 1 - 0  PHQ - 2 Score 0 0 1 1 0  Altered sleeping 1 1 1 2  -  Tired, decreased energy 3 1 1 1  -  Change in appetite 0 0 0 0 -  Feeling bad or failure about yourself  0 0 0 0 -  Trouble concentrating 0 0 1 0 -  Moving slowly or fidgety/restless 0 0 0 0 -  Suicidal thoughts 0 0 0 0 -  PHQ-9 Score 4 2 4 4  -  Some recent data might be hidden   GAD 7 : Generalized Anxiety Score 06/24/2020 06/24/2020 05/28/2020 05/20/2020  Nervous, Anxious, on Edge 0 0 0 0  Control/stop worrying 0 0 1 1  Worry too much - different things 1 0 1 0  Trouble relaxing 0 0 1 1  Restless 0 0 0 0  Easily annoyed or irritable 1 0 1 1  Afraid - awful might happen 0 0 0 0  Total GAD 7 Score 2 0 4 3

## 2020-07-12 ENCOUNTER — Encounter: Payer: Self-pay | Admitting: *Deleted

## 2020-07-13 ENCOUNTER — Telehealth: Payer: Self-pay | Admitting: *Deleted

## 2020-07-13 ENCOUNTER — Ambulatory Visit: Payer: Medicaid Other | Attending: Obstetrics and Gynecology

## 2020-07-13 ENCOUNTER — Ambulatory Visit: Payer: Medicaid Other | Admitting: *Deleted

## 2020-07-13 ENCOUNTER — Other Ambulatory Visit: Payer: Self-pay

## 2020-07-13 ENCOUNTER — Other Ambulatory Visit: Payer: Self-pay | Admitting: *Deleted

## 2020-07-13 ENCOUNTER — Ambulatory Visit (INDEPENDENT_AMBULATORY_CARE_PROVIDER_SITE_OTHER): Payer: Medicaid Other | Admitting: Clinical

## 2020-07-13 ENCOUNTER — Other Ambulatory Visit: Payer: Self-pay | Admitting: Obstetrics and Gynecology

## 2020-07-13 VITALS — BP 127/71 | HR 106

## 2020-07-13 DIAGNOSIS — E119 Type 2 diabetes mellitus without complications: Secondary | ICD-10-CM | POA: Diagnosis not present

## 2020-07-13 DIAGNOSIS — O24313 Unspecified pre-existing diabetes mellitus in pregnancy, third trimester: Secondary | ICD-10-CM | POA: Diagnosis not present

## 2020-07-13 DIAGNOSIS — E669 Obesity, unspecified: Secondary | ICD-10-CM

## 2020-07-13 DIAGNOSIS — O99323 Drug use complicating pregnancy, third trimester: Secondary | ICD-10-CM

## 2020-07-13 DIAGNOSIS — Z79891 Long term (current) use of opiate analgesic: Secondary | ICD-10-CM

## 2020-07-13 DIAGNOSIS — O99213 Obesity complicating pregnancy, third trimester: Secondary | ICD-10-CM | POA: Diagnosis not present

## 2020-07-13 DIAGNOSIS — F1120 Opioid dependence, uncomplicated: Secondary | ICD-10-CM

## 2020-07-13 DIAGNOSIS — O24113 Pre-existing diabetes mellitus, type 2, in pregnancy, third trimester: Secondary | ICD-10-CM | POA: Diagnosis not present

## 2020-07-13 DIAGNOSIS — Z362 Encounter for other antenatal screening follow-up: Secondary | ICD-10-CM | POA: Diagnosis not present

## 2020-07-13 DIAGNOSIS — Z3A32 32 weeks gestation of pregnancy: Secondary | ICD-10-CM

## 2020-07-13 DIAGNOSIS — F339 Major depressive disorder, recurrent, unspecified: Secondary | ICD-10-CM

## 2020-07-13 DIAGNOSIS — Z658 Other specified problems related to psychosocial circumstances: Secondary | ICD-10-CM

## 2020-07-13 DIAGNOSIS — O0933 Supervision of pregnancy with insufficient antenatal care, third trimester: Secondary | ICD-10-CM | POA: Diagnosis not present

## 2020-07-13 MED ORDER — BUPRENORPHINE HCL-NALOXONE HCL 8-2 MG SL FILM: 8.0000 mg | ORAL_FILM | Freq: Three times a day (TID) | SUBLINGUAL | 0 refills | Status: DC

## 2020-07-13 NOTE — Telephone Encounter (Signed)
[name] called 07/12/20 pm and left a message she is requesting a refill of her suboxone. States she has appointment Wednesday but is due a refill Tuesday.  I reviewed her chart and verified she had last refill 11/23 and is due a refill today.  I called Dr.Constant and she will send in refill today.  I also called Monia and left a message notifying her we got her message and one of our physicians is sending refill in later today. Please notify us if any issues and will see you at appointment tomorrow. Signe Tackitt,RN

## 2020-07-13 NOTE — Telephone Encounter (Signed)
Refill sent.

## 2020-07-14 ENCOUNTER — Encounter: Payer: Medicaid Other | Admitting: Obstetrics and Gynecology

## 2020-07-27 ENCOUNTER — Encounter: Payer: Self-pay | Admitting: Family Medicine

## 2020-07-27 ENCOUNTER — Ambulatory Visit (INDEPENDENT_AMBULATORY_CARE_PROVIDER_SITE_OTHER): Payer: Medicaid Other | Admitting: Family Medicine

## 2020-07-27 ENCOUNTER — Other Ambulatory Visit: Payer: Self-pay

## 2020-07-27 VITALS — BP 113/73 | HR 106 | Wt 268.2 lb

## 2020-07-27 DIAGNOSIS — Z98891 History of uterine scar from previous surgery: Secondary | ICD-10-CM

## 2020-07-27 DIAGNOSIS — O99322 Drug use complicating pregnancy, second trimester: Secondary | ICD-10-CM

## 2020-07-27 DIAGNOSIS — Z283 Underimmunization status: Secondary | ICD-10-CM

## 2020-07-27 DIAGNOSIS — O24112 Pre-existing diabetes mellitus, type 2, in pregnancy, second trimester: Secondary | ICD-10-CM

## 2020-07-27 DIAGNOSIS — O99210 Obesity complicating pregnancy, unspecified trimester: Secondary | ICD-10-CM

## 2020-07-27 DIAGNOSIS — O0990 Supervision of high risk pregnancy, unspecified, unspecified trimester: Secondary | ICD-10-CM | POA: Diagnosis not present

## 2020-07-27 DIAGNOSIS — O24113 Pre-existing diabetes mellitus, type 2, in pregnancy, third trimester: Secondary | ICD-10-CM

## 2020-07-27 DIAGNOSIS — O0992 Supervision of high risk pregnancy, unspecified, second trimester: Secondary | ICD-10-CM

## 2020-07-27 DIAGNOSIS — O0930 Supervision of pregnancy with insufficient antenatal care, unspecified trimester: Secondary | ICD-10-CM

## 2020-07-27 DIAGNOSIS — Z3A24 24 weeks gestation of pregnancy: Secondary | ICD-10-CM | POA: Diagnosis not present

## 2020-07-27 DIAGNOSIS — O0932 Supervision of pregnancy with insufficient antenatal care, second trimester: Secondary | ICD-10-CM

## 2020-07-27 DIAGNOSIS — O99212 Obesity complicating pregnancy, second trimester: Secondary | ICD-10-CM

## 2020-07-27 DIAGNOSIS — Z2839 Other underimmunization status: Secondary | ICD-10-CM

## 2020-07-27 DIAGNOSIS — F1120 Opioid dependence, uncomplicated: Secondary | ICD-10-CM

## 2020-07-27 DIAGNOSIS — O99320 Drug use complicating pregnancy, unspecified trimester: Secondary | ICD-10-CM

## 2020-07-27 DIAGNOSIS — O99891 Other specified diseases and conditions complicating pregnancy: Secondary | ICD-10-CM | POA: Diagnosis not present

## 2020-07-27 MED ORDER — BUPRENORPHINE HCL-NALOXONE HCL 8-2 MG SL FILM: 8.0000 mg | ORAL_FILM | Freq: Three times a day (TID) | SUBLINGUAL | 0 refills | Status: DC

## 2020-07-27 NOTE — Progress Notes (Signed)
   Subjective:  Krystal [name] is a 34 y.o. [phone] at [redacted]w[redacted]d being seen today for ongoing prenatal care.  She is currently monitored for the following issues for this high-risk pregnancy and has Obesity in pregnancy; TOBACCO DEPENDENCE; DEPRESSION, MILD; MIGRAINE, UNSPEC., W/O INTRACTABLE MIGRAINE; ECZEMA, ATOPIC; HIDRADENITIS SUPPURATIVA; GERD (gastroesophageal reflux disease); History of VBAC; Alopecia; Acne; Bilateral knee pain; Supervision of high risk pregnancy, antepartum; H/O: substance abuse (HCC); Rubella non-immune status, antepartum; Pre-existing type 2 diabetes mellitus in pregnancy; Uncontrolled type 2 diabetes mellitus without complication, without long-term current use of insulin; Moderate episode of recurrent major depressive disorder (HCC); Acute pain of right knee; BMI 40.0-44.9, adult (HCC); Pregnancy complicated by subutex maintenance, antepartum (HCC); and Late prenatal care on their problem list.  Patient reports no complaints.  Contractions: Not present. Vag. Bleeding: None.  Movement: Present. Denies leaking of fluid.   The following portions of the patient's history were reviewed and updated as appropriate: allergies, current medications, past family history, past medical history, past social history, past surgical history and problem list. Problem list updated.  Objective:   Vitals:   07/27/20 1434  BP: 113/73  Pulse: (!) 106  Weight: 268 lb 3.2 oz (121.7 kg)    Fetal Status: Fetal Heart Rate (bpm): 148   Movement: Present     General:  Alert, oriented and cooperative. Patient is in no acute distress.  Skin: Skin is warm and dry. No rash noted.   Cardiovascular: Normal heart rate noted  Respiratory: Normal respiratory effort, no problems with respiration noted  Abdomen: Soft, gravid, appropriate for gestational age. Pain/Pressure: Present     Pelvic: Vag. Bleeding: None     Cervical exam deferred        Extremities: Normal range of motion.  Edema: Trace   Mental Status: Normal mood and affect. Normal behavior. Normal judgment and thought content.   Urinalysis:      Assessment and Plan:  Pregnancy: G4P3003 at [redacted]w[redacted]d  1. Supervision of high risk pregnancy, antepartum BP and FHR normal Has not spoke with NICU yet about NAS protocol, will message them to outreach Wants IUD PP but unsure about timing, will discuss again at next visit  2. Pre-existing type 2 diabetes mellitus during pregnancy in third trimester Did not bring log Reports fastings are around 70-80 Highest post prandials are 130-140's, usually around 110-120 Start antenatal testing tomorrow [place] have been reassuring  3. Pregnancy complicated by subutex maintenance, antepartum (HCC) Refill sent for suboxone Reporting some overnight cravings and withdrawal symptoms Will consider increasing nighttime dose at next visit  4. Obesity in pregnancy   5. History of VBAC   6. Late prenatal care   7. Rubella non-immune status, antepartum   Preterm labor symptoms and general obstetric precautions including but not limited to vaginal bleeding, contractions, leaking of fluid and fetal movement were reviewed in detail with the patient. Please refer to After Visit Summary for other counseling recommendations.  Return in 2 weeks (on 08/10/2020) for [facility], ob visit, needs MD.   Venora Maples, MD

## 2020-07-27 NOTE — Patient Instructions (Signed)
 Third Trimester of Pregnancy The third trimester is from week 28 through week 40 (months 7 through 9). The third trimester is a time when the unborn baby (fetus) is growing rapidly. At the end of the ninth month, the fetus is about 20 inches in length and weighs 6-10 pounds. Body changes during your third trimester Your body will continue to go through many changes during pregnancy. The changes vary from woman to woman. During the third trimester:  Your weight will continue to increase. You can expect to gain 25-35 pounds (11-16 kg) by the end of the pregnancy.  You may begin to get stretch marks on your hips, abdomen, and breasts.  You may urinate more often because the fetus is moving lower into your pelvis and pressing on your bladder.  You may develop or continue to have heartburn. This is caused by increased hormones that slow down muscles in the digestive tract.  You may develop or continue to have constipation because increased hormones slow digestion and cause the muscles that push waste through your intestines to relax.  You may develop hemorrhoids. These are swollen veins (varicose veins) in the rectum that can itch or be painful.  You may develop swollen, bulging veins (varicose veins) in your legs.  You may have increased body aches in the pelvis, back, or thighs. This is due to weight gain and increased hormones that are relaxing your joints.  You may have changes in your hair. These can include thickening of your hair, rapid growth, and changes in texture. Some women also have hair loss during or after pregnancy, or hair that feels dry or thin. Your hair will most likely return to normal after your baby is born.  Your breasts will continue to grow and they will continue to become tender. A yellow fluid (colostrum) may leak from your breasts. This is the first milk you are producing for your baby.  Your belly button may stick out.  You may notice more swelling in your  hands, face, or ankles.  You may have increased tingling or numbness in your hands, arms, and legs. The skin on your belly may also feel numb.  You may feel short of breath because of your expanding uterus.  You may have more problems sleeping. This can be caused by the size of your belly, increased need to urinate, and an increase in your body's metabolism.  You may notice the fetus "dropping," or moving lower in your abdomen (lightening).  You may have increased vaginal discharge.  You may notice your joints feel loose and you may have pain around your pelvic bone. What to expect at prenatal visits You will have prenatal exams every 2 weeks until week 36. Then you will have weekly prenatal exams. During a routine prenatal visit:  You will be weighed to make sure you and the baby are growing normally.  Your blood pressure will be taken.  Your abdomen will be measured to track your baby's growth.  The fetal heartbeat will be listened to.  Any test results from the previous visit will be discussed.  You may have a cervical check near your due date to see if your cervix has softened or thinned (effaced).  You will be tested for Group B streptococcus. This happens between 35 and 37 weeks. Your health care provider may ask you:  What your birth plan is.  How you are feeling.  If you are feeling the baby move.  If you have had any   abnormal symptoms, such as leaking fluid, bleeding, severe headaches, or abdominal cramping.  If you are using any tobacco products, including cigarettes, chewing tobacco, and electronic cigarettes.  If you have any questions. Other tests or screenings that may be performed during your third trimester include:  Blood tests that check for low iron levels (anemia).  Fetal testing to check the health, activity level, and growth of the fetus. Testing is done if you have certain medical conditions or if there are problems during the  pregnancy.  Nonstress test (NST). This test checks the health of your baby to make sure there are no signs of problems, such as the baby not getting enough oxygen. During this test, a belt is placed around your belly. The baby is made to move, and its heart rate is monitored during movement. What is false labor? False labor is a condition in which you feel small, irregular tightenings of the muscles in the womb (contractions) that usually go away with rest, changing position, or drinking water. These are called Braxton Hicks contractions. Contractions may last for hours, days, or even weeks before true labor sets in. If contractions come at regular intervals, become more frequent, increase in intensity, or become painful, you should see your health care provider. What are the signs of labor?  Abdominal cramps.  Regular contractions that start at 10 minutes apart and become stronger and more frequent with time.  Contractions that start on the top of the uterus and spread down to the lower abdomen and back.  Increased pelvic pressure and dull back pain.  A watery or bloody mucus discharge that comes from the vagina.  Leaking of amniotic fluid. This is also known as your "water breaking." It could be a slow trickle or a gush. Let your health care provider know if it has a color or strange odor. If you have any of these signs, call your health care provider right away, even if it is before your due date. Follow these instructions at home: Medicines  Follow your health care provider's instructions regarding medicine use. Specific medicines may be either safe or unsafe to take during pregnancy.  Take a prenatal vitamin that contains at least 600 micrograms (mcg) of folic acid.  If you develop constipation, try taking a stool softener if your health care provider approves. Eating and drinking   Eat a balanced diet that includes fresh fruits and vegetables, whole grains, good sources of protein  such as meat, eggs, or tofu, and low-fat dairy. Your health care provider will help you determine the amount of weight gain that is right for you.  Avoid raw meat and uncooked cheese. These carry germs that can cause birth defects in the baby.  If you have low calcium intake from food, talk to your health care provider about whether you should take a daily calcium supplement.  Eat four or five small meals rather than three large meals a day.  Limit foods that are high in fat and processed sugars, such as fried and sweet foods.  To prevent constipation: ? Drink enough fluid to keep your urine clear or pale yellow. ? Eat foods that are high in fiber, such as fresh fruits and vegetables, whole grains, and beans. Activity  Exercise only as directed by your health care provider. Most women can continue their usual exercise routine during pregnancy. Try to exercise for 30 minutes at least 5 days a week. Stop exercising if you experience uterine contractions.  Avoid heavy lifting.    Do not exercise in extreme heat or humidity, or at high altitudes.  Wear low-heel, comfortable shoes.  Practice good posture.  You may continue to have sex unless your health care provider tells you otherwise. Relieving pain and discomfort  Take frequent breaks and rest with your legs elevated if you have leg cramps or low back pain.  Take warm sitz baths to soothe any pain or discomfort caused by hemorrhoids. Use hemorrhoid cream if your health care provider approves.  Wear a good support bra to prevent discomfort from breast tenderness.  If you develop varicose veins: ? Wear support pantyhose or compression stockings as told by your healthcare provider. ? Elevate your feet for 15 minutes, 3-4 times a day. Prenatal care  Write down your questions. Take them to your prenatal visits.  Keep all your prenatal visits as told by your health care provider. This is important. Safety  Wear your seat belt at  all times when driving.  Make a list of emergency phone numbers, including numbers for family, friends, the hospital, and police and fire departments. General instructions  Avoid cat litter boxes and soil used by cats. These carry germs that can cause birth defects in the baby. If you have a cat, ask someone to clean the litter box for you.  Do not travel far distances unless it is absolutely necessary and only with the approval of your health care provider.  Do not use hot tubs, steam rooms, or saunas.  Do not drink alcohol.  Do not use any products that contain nicotine or tobacco, such as cigarettes and e-cigarettes. If you need help quitting, ask your health care provider.  Do not use any medicinal herbs or unprescribed drugs. These chemicals affect the formation and growth of the baby.  Do not douche or use tampons or scented sanitary pads.  Do not cross your legs for long periods of time.  To prepare for the arrival of your baby: ? Take prenatal classes to understand, practice, and ask questions about labor and delivery. ? Make a trial run to the hospital. ? Visit the hospital and tour the maternity area. ? Arrange for maternity or paternity leave through employers. ? Arrange for family and friends to take care of pets while you are in the hospital. ? Purchase a rear-facing car seat and make sure you know how to install it in your car. ? Pack your hospital bag. ? Prepare the baby's nursery. Make sure to remove all pillows and stuffed animals from the baby's crib to prevent suffocation.  Visit your dentist if you have not gone during your pregnancy. Use a soft toothbrush to brush your teeth and be gentle when you floss. Contact a health care provider if:  You are unsure if you are in labor or if your water has broken.  You become dizzy.  You have mild pelvic cramps, pelvic pressure, or nagging pain in your abdominal area.  You have lower back pain.  You have persistent  nausea, vomiting, or diarrhea.  You have an unusual or bad smelling vaginal discharge.  You have pain when you urinate. Get help right away if:  Your water breaks before 37 weeks.  You have regular contractions less than 5 minutes apart before 37 weeks.  You have a fever.  You are leaking fluid from your vagina.  You have spotting or bleeding from your vagina.  You have severe abdominal pain or cramping.  You have rapid weight loss or weight gain.  You   have shortness of breath with chest pain.  You notice sudden or extreme swelling of your face, hands, ankles, feet, or legs.  Your baby makes fewer than 10 movements in 2 hours.  You have severe headaches that do not go away when you take medicine.  You have vision changes. Summary  The third trimester is from week 28 through week 40, months 7 through 9. The third trimester is a time when the unborn baby (fetus) is growing rapidly.  During the third trimester, your discomfort may increase as you and your baby continue to gain weight. You may have abdominal, leg, and back pain, sleeping problems, and an increased need to urinate.  During the third trimester your breasts will keep growing and they will continue to become tender. A yellow fluid (colostrum) may leak from your breasts. This is the first milk you are producing for your baby.  False labor is a condition in which you feel small, irregular tightenings of the muscles in the womb (contractions) that eventually go away. These are called Braxton Hicks contractions. Contractions may last for hours, days, or even weeks before true labor sets in.  Signs of labor can include: abdominal cramps; regular contractions that start at 10 minutes apart and become stronger and more frequent with time; watery or bloody mucus discharge that comes from the vagina; increased pelvic pressure and dull back pain; and leaking of amniotic fluid. This information is not intended to replace advice  given to you by your health care provider. Make sure you discuss any questions you have with your health care provider. Document Revised: 11/14/2018 Document Reviewed: 08/29/2016 Else[employer] Patient Education  2020 [employer]   Contraception Choices Contraception, also called birth control, refers to methods or devices that prevent pregnancy. Hormonal methods Contraceptive implant  A contraceptive implant is a thin, plastic tube that contains a hormone. It is inserted into the upper part of the arm. It can remain in place for up to 3 years. Progestin-only injections Progestin-only injections are injections of progestin, a synthetic form of the hormone progesterone. They are given every 3 months by a health care provider. Birth control pills  Birth control pills are pills that contain hormones that prevent pregnancy. They must be taken once a day, preferably at the same time each day. Birth control patch  The birth control patch contains hormones that prevent pregnancy. It is placed on the skin and must be changed once a week for three weeks and removed on the fourth week. A prescription is needed to use this method of contraception. Vaginal ring  A vaginal ring contains hormones that prevent pregnancy. It is placed in the vagina for three weeks and removed on the fourth week. After that, the process is repeated with a new ring. A prescription is needed to use this method of contraception. Emergency contraceptive Emergency contraceptives prevent pregnancy after unprotected sex. They come in pill form and can be taken up to 5 days after sex. They work best the sooner they are taken after having sex. Most emergency contraceptives are available without a prescription. This method should not be used as your only form of birth control. Barrier methods Female condom  A female condom is a thin sheath that is worn over the penis during sex. Condoms keep sperm from going inside a woman's body. They can  be used with a spermicide to increase their effectiveness. They should be disposed after a single use. Female condom  A female condom is a soft,   loose-fitting sheath that is put into the vagina before sex. The condom keeps sperm from going inside a woman's body. They should be disposed after a single use. Diaphragm  A diaphragm is a soft, dome-shaped barrier. It is inserted into the vagina before sex, along with a spermicide. The diaphragm blocks sperm from entering the uterus, and the spermicide kills sperm. A diaphragm should be left in the vagina for 6-8 hours after sex and removed within 24 hours. A diaphragm is prescribed and fitted by a health care provider. A diaphragm should be replaced every 1-2 years, after giving birth, after gaining more than 15 lb (6.8 kg), and after pelvic surgery. Cervical cap  A cervical cap is a round, soft latex or plastic cup that fits over the cervix. It is inserted into the vagina before sex, along with spermicide. It blocks sperm from entering the uterus. The cap should be left in place for 6-8 hours after sex and removed within 48 hours. A cervical cap must be prescribed and fitted by a health care provider. It should be replaced every 2 years. Sponge  A sponge is a soft, circular piece of polyurethane foam with spermicide on it. The sponge helps block sperm from entering the uterus, and the spermicide kills sperm. To use it, you make it wet and then insert it into the vagina. It should be inserted before sex, left in for at least 6 hours after sex, and removed and thrown away within 30 hours. Spermicides Spermicides are chemicals that kill or block sperm from entering the cervix and uterus. They can come as a cream, jelly, suppository, foam, or tablet. A spermicide should be inserted into the vagina with an applicator at least 10-15 minutes before sex to allow time for it to work. The process must be repeated every time you have sex. Spermicides do not require  a prescription. Intrauterine contraception Intrauterine device (IUD) An IUD is a T-shaped device that is put in a woman's uterus. There are two types:  Hormone IUD.This type contains progestin, a synthetic form of the hormone progesterone. This type can stay in place for 3-5 years.  Copper IUD.This type is wrapped in copper wire. It can stay in place for 10 years.  Permanent methods of contraception Female tubal ligation In this method, a woman's fallopian tubes are sealed, tied, or blocked during surgery to prevent eggs from traveling to the uterus. Hysteroscopic sterilization In this method, a small, flexible insert is placed into each fallopian tube. The inserts cause scar tissue to form in the fallopian tubes and block them, so sperm cannot reach an egg. The procedure takes about 3 months to be effective. Another form of birth control must be used during those 3 months. Female sterilization This is a procedure to tie off the tubes that carry sperm (vasectomy). After the procedure, the man can still ejaculate fluid (semen). Natural planning methods Natural family planning In this method, a couple does not have sex on days when the woman could become pregnant. Calendar method This means keeping track of the length of each menstrual cycle, identifying the days when pregnancy can happen, and not having sex on those days. Ovulation method In this method, a couple avoids sex during ovulation. Symptothermal method This method involves not having sex during ovulation. The woman typically checks for ovulation by watching changes in her temperature and in the consistency of cervical mucus. Post-ovulation method In this method, a couple waits to have sex until after ovulation. Summary    Contraception, also called birth control, means methods or devices that prevent pregnancy.  Hormonal methods of contraception include implants, injections, pills, patches, vaginal rings, and emergency  contraceptives.  Barrier methods of contraception can include female condoms, female condoms, diaphragms, cervical caps, sponges, and spermicides.  There are two types of IUDs (intrauterine devices). An IUD can be put in a woman's uterus to prevent pregnancy for 3-5 years.  Permanent sterilization can be done through a procedure for males, females, or both.  Natural family planning methods involve not having sex on days when the woman could become pregnant. This information is not intended to replace advice given to you by your health care provider. Make sure you discuss any questions you have with your health care provider. Document Revised: 07/26/2017 Document Reviewed: 08/26/2016 [employer] Patient Education  2020 [employer]   Breastfeeding  Choosing to breastfeed is one of the best decisions you can make for yourself and your baby. A change in hormones during pregnancy causes your breasts to make breast milk in your milk-producing glands. Hormones prevent breast milk from being released before your baby is born. They also prompt milk flow after birth. Once breastfeeding has begun, thoughts of your baby, as well as his or her sucking or crying, can stimulate the release of milk from your milk-producing glands. Benefits of breastfeeding Research shows that breastfeeding offers many health benefits for infants and mothers. It also offers a cost-free and convenient way to feed your baby. For your baby  Your first milk (colostrum) helps your baby's digestive system to function better.  Special cells in your milk (antibodies) help your baby to fight off infections.  Breastfed babies are less likely to develop asthma, allergies, obesity, or type 2 diabetes. They are also at lower risk for sudden infant death syndrome (SIDS).  Nutrients in breast milk are better able to meet your baby's needs compared to infant formula.  Breast milk improves your baby's brain development. For  you  Breastfeeding helps to create a very special bond between you and your baby.  Breastfeeding is convenient. Breast milk costs nothing and is always available at the correct temperature.  Breastfeeding helps to burn calories. It helps you to lose the weight that you gained during pregnancy.  Breastfeeding makes your uterus return faster to its size before pregnancy. It also slows bleeding (lochia) after you give birth.  Breastfeeding helps to lower your risk of developing type 2 diabetes, osteoporosis, rheumatoid arthritis, cardiovascular disease, and breast, ovarian, uterine, and endometrial cancer later in life. Breastfeeding basics Starting breastfeeding  Find a comfortable place to sit or lie down, with your neck and back well-supported.  Place a pillow or a rolled-up blanket under your baby to bring him or her to the level of your breast (if you are seated). Nursing pillows are specially designed to help support your arms and your baby while you breastfeed.  Make sure that your baby's tummy (abdomen) is facing your abdomen.  Gently massage your breast. With your fingertips, massage from the outer edges of your breast inward toward the nipple. This encourages milk flow. If your milk flows slowly, you may need to continue this action during the feeding.  Support your breast with 4 fingers underneath and your thumb above your nipple (make the letter "C" with your hand). Make sure your fingers are well away from your nipple and your baby's mouth.  Stroke your baby's lips gently with your finger or nipple.  When your baby's mouth is open   wide enough, quickly bring your baby to your breast, placing your entire nipple and as much of the areola as possible into your baby's mouth. The areola is the colored area around your nipple. ? More areola should be visible above your baby's upper lip than below the lower lip. ? Your baby's lips should be opened and extended outward (flanged) to  ensure an adequate, comfortable latch. ? Your baby's tongue should be between his or her lower gum and your breast.  Make sure that your baby's mouth is correctly positioned around your nipple (latched). Your baby's lips should create a seal on your breast and be turned out (everted).  It is common for your baby to suck about 2-3 minutes in order to start the flow of breast milk. Latching Teaching your baby how to latch onto your breast properly is very important. An improper latch can cause nipple pain, decreased milk supply, and poor weight gain in your baby. Also, if your baby is not latched onto your nipple properly, he or she may swallow some air during feeding. This can make your baby fussy. Burping your baby when you switch breasts during the feeding can help to get rid of the air. However, teaching your baby to latch on properly is still the best way to prevent fussiness from swallowing air while breastfeeding. Signs that your baby has successfully latched onto your nipple  Silent tugging or silent sucking, without causing you pain. Infant's lips should be extended outward (flanged).  Swallowing heard between every 3-4 sucks once your milk has started to flow (after your let-down milk reflex occurs).  Muscle movement above and in front of his or her ears while sucking. Signs that your baby has not successfully latched onto your nipple  Sucking sounds or smacking sounds from your baby while breastfeeding.  Nipple pain. If you think your baby has not latched on correctly, slip your finger into the corner of your baby's mouth to break the suction and place it between your baby's gums. Attempt to start breastfeeding again. Signs of successful breastfeeding Signs from your baby  Your baby will gradually decrease the number of sucks or will completely stop sucking.  Your baby will fall asleep.  Your baby's body will relax.  Your baby will retain a small amount of milk in his or her  mouth.  Your baby will let go of your breast by himself or herself. Signs from you  Breasts that have increased in firmness, weight, and size 1-3 hours after feeding.  Breasts that are softer immediately after breastfeeding.  Increased milk volume, as well as a change in milk consistency and color by the fifth day of breastfeeding.  Nipples that are not sore, cracked, or bleeding. Signs that your baby is getting enough milk  Wetting at least 1-2 diapers during the first 24 hours after birth.  Wetting at least 5-6 diapers every 24 hours for the first week after birth. The urine should be clear or pale yellow by the age of 5 days.  Wetting 6-8 diapers every 24 hours as your baby continues to grow and develop.  At least 3 stools in a 24-hour period by the age of 5 days. The stool should be soft and yellow.  At least 3 stools in a 24-hour period by the age of 7 days. The stool should be seedy and yellow.  No loss of weight greater than 10% of birth weight during the first 3 days of life.  Average weight gain   of 4-7 oz (113-198 g) per week after the age of 4 days.  Consistent daily weight gain by the age of 5 days, without weight loss after the age of 2 weeks. After a feeding, your baby may spit up a small amount of milk. This is normal. Breastfeeding frequency and duration Frequent feeding will help you make more milk and can prevent sore nipples and extremely full breasts (breast engorgement). Breastfeed when you feel the need to reduce the fullness of your breasts or when your baby shows signs of hunger. This is called "breastfeeding on demand." Signs that your baby is hungry include:  Increased alertness, activity, or restlessness.  Movement of the head from side to side.  Opening of the mouth when the corner of the mouth or cheek is stroked (rooting).  Increased sucking sounds, smacking lips, cooing, sighing, or squeaking.  Hand-to-mouth movements and sucking on fingers or  hands.  Fussing or crying. Avoid introducing a pacifier to your baby in the first 4-6 weeks after your baby is born. After this time, you may choose to use a pacifier. Research has shown that pacifier use during the first year of a baby's life decreases the risk of sudden infant death syndrome (SIDS). Allow your baby to feed on each breast as long as he or she wants. When your baby unlatches or falls asleep while feeding from the first breast, offer the second breast. Because newborns are often sleepy in the first few weeks of life, you may need to awaken your baby to get him or her to feed. Breastfeeding times will vary from baby to baby. However, the following rules can serve as a guide to help you make sure that your baby is properly fed:  Newborns (babies 4 weeks of age or younger) may breastfeed every 1-3 hours.  Newborns should not go without breastfeeding for longer than 3 hours during the day or 5 hours during the night.  You should breastfeed your baby a minimum of 8 times in a 24-hour period. Breast milk pumping     Pumping and storing breast milk allows you to make sure that your baby is exclusively fed your breast milk, even at times when you are unable to breastfeed. This is especially important if you go back to work while you are still breastfeeding, or if you are not able to be present during feedings. Your lactation consultant can help you find a method of pumping that works best for you and give you guidelines about how long it is safe to store breast milk. Caring for your breasts while you breastfeed Nipples can become dry, cracked, and sore while breastfeeding. The following recommendations can help keep your breasts moisturized and healthy:  Avoid using soap on your nipples.  Wear a supportive bra designed especially for nursing. Avoid wearing underwire-style bras or extremely tight bras (sports bras).  Air-dry your nipples for 3-4 minutes after each feeding.  Use only  cotton bra pads to absorb leaked breast milk. Leaking of breast milk between feedings is normal.  Use lanolin on your nipples after breastfeeding. Lanolin helps to maintain your skin's normal moisture barrier. Pure lanolin is not harmful (not toxic) to your baby. You may also hand express a few drops of breast milk and gently massage that milk into your nipples and allow the milk to air-dry. In the first few weeks after giving birth, some women experience breast engorgement. Engorgement can make your breasts feel heavy, warm, and tender to the touch. Engorgement   peaks within 3-5 days after you give birth. The following recommendations can help to ease engorgement:  Completely empty your breasts while breastfeeding or pumping. You may want to start by applying warm, moist heat (in the shower or with warm, water-soaked hand towels) just before feeding or pumping. This increases circulation and helps the milk flow. If your baby does not completely empty your breasts while breastfeeding, pump any extra milk after he or she is finished.  Apply ice packs to your breasts immediately after breastfeeding or pumping, unless this is too uncomfortable for you. To do this: ? Put ice in a plastic bag. ? Place a towel between your skin and the bag. ? Leave the ice on for 20 minutes, 2-3 times a day.  Make sure that your baby is latched on and positioned properly while breastfeeding. If engorgement persists after 48 hours of following these recommendations, contact your health care provider or a lactation consultant. Overall health care recommendations while breastfeeding  Eat 3 healthy meals and 3 snacks every day. Well-nourished mothers who are breastfeeding need an additional 450-500 calories a day. You can meet this requirement by increasing the amount of a balanced diet that you eat.  Drink enough water to keep your urine pale yellow or clear.  Rest often, relax, and continue to take your prenatal vitamins  to prevent fatigue, stress, and low vitamin and mineral levels in your body (nutrient deficiencies).  Do not use any products that contain nicotine or tobacco, such as cigarettes and e-cigarettes. Your baby may be harmed by chemicals from cigarettes that pass into breast milk and exposure to secondhand smoke. If you need help quitting, ask your health care provider.  Avoid alcohol.  Do not use illegal drugs or marijuana.  Talk with your health care provider before taking any medicines. These include over-the-counter and prescription medicines as well as vitamins and herbal supplements. Some medicines that may be harmful to your baby can pass through breast milk.  It is possible to become pregnant while breastfeeding. If birth control is desired, ask your health care provider about options that will be safe while breastfeeding your baby. Where to find more information: [employer]: www.llli.org Contact a health care provider if:  You feel like you want to stop breastfeeding or have become frustrated with breastfeeding.  Your nipples are cracked or bleeding.  Your breasts are red, tender, or warm.  You have: ? Painful breasts or nipples. ? A swollen area on either breast. ? A fever or chills. ? Nausea or vomiting. ? Drainage other than breast milk from your nipples.  Your breasts do not become full before feedings by the fifth day after you give birth.  You feel sad and depressed.  Your baby is: ? Too sleepy to eat well. ? Having trouble sleeping. ? More than 1 week old and wetting fewer than 6 diapers in a 24-hour period. ? Not gaining weight by 5 days of age.  Your baby has fewer than 3 stools in a 24-hour period.  Your baby's skin or the white parts of his or her eyes become yellow. Get help right away if:  Your baby is overly tired (lethargic) and does not want to wake up and feed.  Your baby develops an unexplained fever. Summary  Breastfeeding  offers many health benefits for infant and mothers.  Try to breastfeed your infant when he or she shows early signs of hunger.  Gently tickle or stroke your baby's lips with   your finger or nipple to allow the baby to open his or her mouth. Bring the baby to your breast. Make sure that much of the areola is in your baby's mouth. Offer one side and burp the baby before you offer the other side.  Talk with your health care provider or lactation consultant if you have questions or you face problems as you breastfeed. This information is not intended to replace advice given to you by your health care provider. Make sure you discuss any questions you have with your health care provider. Document Revised: 10/18/2017 Document Reviewed: 08/25/2016 Elsevier Patient Education  2020 Elsevier Inc.  

## 2020-07-27 NOTE — Addendum Note (Signed)
Addended by: Marjo Bicker on: 07/27/2020 04:53 PM   Modules accepted: Orders

## 2020-07-28 ENCOUNTER — Encounter: Payer: Self-pay | Admitting: *Deleted

## 2020-07-28 ENCOUNTER — Ambulatory Visit: Payer: Medicaid Other | Attending: Obstetrics

## 2020-07-28 ENCOUNTER — Ambulatory Visit: Payer: Medicaid Other | Admitting: *Deleted

## 2020-07-28 VITALS — BP 119/62 | HR 103

## 2020-07-28 DIAGNOSIS — Z3A34 34 weeks gestation of pregnancy: Secondary | ICD-10-CM

## 2020-07-28 DIAGNOSIS — O99212 Obesity complicating pregnancy, second trimester: Secondary | ICD-10-CM

## 2020-07-28 DIAGNOSIS — Z79891 Long term (current) use of opiate analgesic: Secondary | ICD-10-CM

## 2020-07-28 DIAGNOSIS — E119 Type 2 diabetes mellitus without complications: Secondary | ICD-10-CM | POA: Diagnosis not present

## 2020-07-28 DIAGNOSIS — F1120 Opioid dependence, uncomplicated: Secondary | ICD-10-CM

## 2020-07-28 DIAGNOSIS — O34219 Maternal care for unspecified type scar from previous cesarean delivery: Secondary | ICD-10-CM | POA: Diagnosis not present

## 2020-07-28 DIAGNOSIS — O0933 Supervision of pregnancy with insufficient antenatal care, third trimester: Secondary | ICD-10-CM | POA: Diagnosis not present

## 2020-07-28 DIAGNOSIS — Z362 Encounter for other antenatal screening follow-up: Secondary | ICD-10-CM

## 2020-07-28 DIAGNOSIS — O99320 Drug use complicating pregnancy, unspecified trimester: Secondary | ICD-10-CM

## 2020-07-28 DIAGNOSIS — O24113 Pre-existing diabetes mellitus, type 2, in pregnancy, third trimester: Secondary | ICD-10-CM | POA: Insufficient documentation

## 2020-07-28 LAB — POCT URINALYSIS DIP (DEVICE)
Bilirubin Urine: NEGATIVE
Glucose, UA: NEGATIVE mg/dL
Hgb urine dipstick: NEGATIVE
Ketones, ur: NEGATIVE mg/dL
Nitrite: NEGATIVE
Protein, ur: NEGATIVE mg/dL
Specific Gravity, Urine: 1.025 (ref 1.005–1.030)
Urobilinogen, UA: 1 mg/dL (ref 0.0–1.0)
pH: 7 (ref 5.0–8.0)

## 2020-07-30 LAB — URINE CULTURE, OB REFLEX

## 2020-07-30 LAB — CULTURE, OB URINE

## 2020-07-31 ENCOUNTER — Other Ambulatory Visit: Payer: Self-pay | Admitting: Family Medicine

## 2020-07-31 MED ORDER — NITROFURANTOIN MONOHYD MACRO 100 MG PO CAPS: 100.0000 mg | ORAL_CAPSULE | Freq: Two times a day (BID) | ORAL | 0 refills | Status: AC

## 2020-07-31 NOTE — Progress Notes (Signed)
UTI treatment

## 2020-08-03 ENCOUNTER — Ambulatory Visit: Payer: Medicaid Other | Admitting: *Deleted

## 2020-08-03 ENCOUNTER — Encounter: Payer: Self-pay | Admitting: *Deleted

## 2020-08-03 ENCOUNTER — Ambulatory Visit: Payer: Medicaid Other | Attending: Obstetrics

## 2020-08-03 ENCOUNTER — Other Ambulatory Visit: Payer: Self-pay | Admitting: Obstetrics

## 2020-08-03 ENCOUNTER — Other Ambulatory Visit: Payer: Self-pay

## 2020-08-03 VITALS — BP 122/89 | HR 104

## 2020-08-03 DIAGNOSIS — O24113 Pre-existing diabetes mellitus, type 2, in pregnancy, third trimester: Secondary | ICD-10-CM

## 2020-08-03 DIAGNOSIS — Z79891 Long term (current) use of opiate analgesic: Secondary | ICD-10-CM | POA: Diagnosis not present

## 2020-08-03 DIAGNOSIS — Z3A35 35 weeks gestation of pregnancy: Secondary | ICD-10-CM | POA: Diagnosis not present

## 2020-08-03 DIAGNOSIS — F1120 Opioid dependence, uncomplicated: Secondary | ICD-10-CM

## 2020-08-03 DIAGNOSIS — O0933 Supervision of pregnancy with insufficient antenatal care, third trimester: Secondary | ICD-10-CM

## 2020-08-03 DIAGNOSIS — O403XX0 Polyhydramnios, third trimester, not applicable or unspecified: Secondary | ICD-10-CM | POA: Diagnosis not present

## 2020-08-03 DIAGNOSIS — E669 Obesity, unspecified: Secondary | ICD-10-CM | POA: Diagnosis not present

## 2020-08-03 DIAGNOSIS — E119 Type 2 diabetes mellitus without complications: Secondary | ICD-10-CM | POA: Diagnosis not present

## 2020-08-03 DIAGNOSIS — O99213 Obesity complicating pregnancy, third trimester: Secondary | ICD-10-CM

## 2020-08-03 DIAGNOSIS — Z362 Encounter for other antenatal screening follow-up: Secondary | ICD-10-CM

## 2020-08-03 DIAGNOSIS — O99323 Drug use complicating pregnancy, third trimester: Secondary | ICD-10-CM

## 2020-08-03 DIAGNOSIS — O34219 Maternal care for unspecified type scar from previous cesarean delivery: Secondary | ICD-10-CM

## 2020-08-03 NOTE — Procedures (Signed)
Krystal [name] [birth year] [redacted]w[redacted]d  Fetus A Non-Stress Test Interpretation for 08/03/20  Indication: Diabetes  Fetal Heart Rate A Mode: External Baseline Rate (A): 140 bpm Variability: Moderate Accelerations: 15 x 15 Decelerations: None Multiple birth?: No  Uterine Activity Mode: Palpation,Toco Contraction Frequency (min): None Resting Tone Palpated: Relaxed Resting Time: Adequate  Interpretation (Fetal Testing) Nonstress Test Interpretation: Reactive Comments: Dr. Judeth Cornfield reviewed tracing.

## 2020-08-05 ENCOUNTER — Encounter: Payer: Self-pay | Admitting: General Practice

## 2020-08-05 ENCOUNTER — Encounter (HOSPITAL_COMMUNITY): Payer: Self-pay | Admitting: *Deleted

## 2020-08-05 ENCOUNTER — Telehealth: Payer: Self-pay | Admitting: Pediatrics

## 2020-08-05 ENCOUNTER — Telehealth (HOSPITAL_COMMUNITY): Payer: Self-pay | Admitting: *Deleted

## 2020-08-05 NOTE — Telephone Encounter (Signed)
Preadmission screen  

## 2020-08-05 NOTE — Telephone Encounter (Signed)
NAS consult very much appreciated.

## 2020-08-07 ENCOUNTER — Other Ambulatory Visit: Payer: Self-pay | Admitting: Advanced Practice Midwife

## 2020-08-07 NOTE — L&D Delivery Note (Signed)
OB/[facility]N Faculty [facility] Delivery Note  Krystal [name] is a 35 y.o. [phone] s/p VBAC at [redacted]w[redacted]d. She was admitted for PPROM at [redacted]w[redacted]d.  ROM: 25h 91m with clear fluid GBS Status: positive; adequate antibiotics prior to delivery Maximum Maternal Temperature: 98.41F  Labor Progress: Pt reported SROM at 2300 on 08/10/20. Given lack of cervical change and absence of contractions, FB was placed at 0615 on 1/5. Pitocin was then started at 0940 on 1/5 s/p expulsion of FB. Good fetal tolerance with up-titration of pitocin. She was found to have complete cervical dilation at 0033 and then delivered s/p a brief second stage as noted below.  Delivery Date/Time: 08/12/20 at 0059 Delivery: Called to room and patient was found to have complete cervical dilation. Head delivered LOA. Tight loop of umbilical cord around infant's left ankle, reduced s/p delivery. Shoulder and body delivered in usual fashion. Infant with spontaneous cry, placed on mother's abdomen, dried and stimulated. Cord clamped x 2 after 30-second delay, and cut by family member under my direct supervision. Cord blood and arterial cord gas (pH 7.14) drawn given initial Apgars. Several dark red, large blood clots visualized immediately prior to spontaneous delivery of placenta. Fundus firm with massage and Pitocin. Labia, perineum, vagina, and cervix were inspected, without evidence of lacerations.   Placenta: 3-vessel cord, intact with several large clots, sent to pathology Complications: suspected partial abruption given large clots s/p delivery of infant Lacerations: none EBL: 350 ml Analgesia: epidural  Infant: female   APGARs 3, 4, 8  weight per medical record  Lynnda Shields, MD OB/GYN Fellow, Faculty Practice

## 2020-08-08 ENCOUNTER — Other Ambulatory Visit: Payer: Self-pay | Admitting: Obstetrics & Gynecology

## 2020-08-09 ENCOUNTER — Telehealth: Payer: Self-pay | Admitting: Clinical

## 2020-08-09 NOTE — Telephone Encounter (Signed)
Follow up call, unable to leave voice message as voice mailbox is full.

## 2020-08-10 ENCOUNTER — Encounter: Payer: Self-pay | Admitting: *Deleted

## 2020-08-10 ENCOUNTER — Other Ambulatory Visit: Payer: Self-pay | Admitting: Obstetrics

## 2020-08-10 ENCOUNTER — Ambulatory Visit (HOSPITAL_BASED_OUTPATIENT_CLINIC_OR_DEPARTMENT_OTHER): Payer: Medicaid Other

## 2020-08-10 ENCOUNTER — Ambulatory Visit: Payer: Medicaid Other | Admitting: *Deleted

## 2020-08-10 ENCOUNTER — Encounter: Payer: Self-pay | Admitting: Lactation Services

## 2020-08-10 ENCOUNTER — Telehealth: Payer: Self-pay | Admitting: *Deleted

## 2020-08-10 ENCOUNTER — Other Ambulatory Visit: Payer: Self-pay

## 2020-08-10 ENCOUNTER — Inpatient Hospital Stay (HOSPITAL_COMMUNITY)
Admission: AD | Admit: 2020-08-10 | Discharge: 2020-08-13 | DRG: 805 | Disposition: A | Discharge status: Home / Self Care | Payer: Medicaid Other | Attending: Obstetrics & Gynecology | Admitting: Obstetrics & Gynecology

## 2020-08-10 VITALS — BP 127/72 | HR 108

## 2020-08-10 DIAGNOSIS — Z8759 Personal history of other complications of pregnancy, childbirth and the puerperium: Secondary | ICD-10-CM | POA: Diagnosis present

## 2020-08-10 DIAGNOSIS — O133 Gestational [pregnancy-induced] hypertension without significant proteinuria, third trimester: Secondary | ICD-10-CM

## 2020-08-10 DIAGNOSIS — O0993 Supervision of high risk pregnancy, unspecified, third trimester: Secondary | ICD-10-CM

## 2020-08-10 DIAGNOSIS — O99323 Drug use complicating pregnancy, third trimester: Secondary | ICD-10-CM

## 2020-08-10 DIAGNOSIS — F17200 Nicotine dependence, unspecified, uncomplicated: Secondary | ICD-10-CM | POA: Diagnosis present

## 2020-08-10 DIAGNOSIS — U071 COVID-19: Secondary | ICD-10-CM | POA: Diagnosis present

## 2020-08-10 DIAGNOSIS — Z2839 Encounter for supervision of normal pregnancy, unspecified, unspecified trimester: Secondary | ICD-10-CM

## 2020-08-10 DIAGNOSIS — F17210 Nicotine dependence, cigarettes, uncomplicated: Secondary | ICD-10-CM | POA: Diagnosis present

## 2020-08-10 DIAGNOSIS — F1120 Opioid dependence, uncomplicated: Secondary | ICD-10-CM

## 2020-08-10 DIAGNOSIS — O99213 Obesity complicating pregnancy, third trimester: Secondary | ICD-10-CM | POA: Diagnosis not present

## 2020-08-10 DIAGNOSIS — F419 Anxiety disorder, unspecified: Secondary | ICD-10-CM | POA: Diagnosis present

## 2020-08-10 DIAGNOSIS — Z3A36 36 weeks gestation of pregnancy: Secondary | ICD-10-CM | POA: Diagnosis not present

## 2020-08-10 DIAGNOSIS — O0933 Supervision of pregnancy with insufficient antenatal care, third trimester: Secondary | ICD-10-CM

## 2020-08-10 DIAGNOSIS — O24113 Pre-existing diabetes mellitus, type 2, in pregnancy, third trimester: Secondary | ICD-10-CM | POA: Diagnosis not present

## 2020-08-10 DIAGNOSIS — O99320 Drug use complicating pregnancy, unspecified trimester: Secondary | ICD-10-CM

## 2020-08-10 DIAGNOSIS — R0902 Hypoxemia: Secondary | ICD-10-CM | POA: Diagnosis not present

## 2020-08-10 DIAGNOSIS — O403XX0 Polyhydramnios, third trimester, not applicable or unspecified: Secondary | ICD-10-CM | POA: Diagnosis present

## 2020-08-10 DIAGNOSIS — O34219 Maternal care for unspecified type scar from previous cesarean delivery: Secondary | ICD-10-CM

## 2020-08-10 DIAGNOSIS — O24119 Pre-existing diabetes mellitus, type 2, in pregnancy, unspecified trimester: Secondary | ICD-10-CM | POA: Diagnosis present

## 2020-08-10 DIAGNOSIS — R000 Tachycardia, unspecified: Secondary | ICD-10-CM | POA: Diagnosis not present

## 2020-08-10 DIAGNOSIS — O24919 Unspecified diabetes mellitus in pregnancy, unspecified trimester: Secondary | ICD-10-CM | POA: Insufficient documentation

## 2020-08-10 DIAGNOSIS — F1110 Opioid abuse, uncomplicated: Secondary | ICD-10-CM | POA: Diagnosis present

## 2020-08-10 DIAGNOSIS — F331 Major depressive disorder, recurrent, moderate: Secondary | ICD-10-CM | POA: Diagnosis present

## 2020-08-10 DIAGNOSIS — O99344 Other mental disorders complicating childbirth: Secondary | ICD-10-CM | POA: Diagnosis present

## 2020-08-10 DIAGNOSIS — O99324 Drug use complicating childbirth: Secondary | ICD-10-CM | POA: Diagnosis present

## 2020-08-10 DIAGNOSIS — O9852 Other viral diseases complicating childbirth: Secondary | ICD-10-CM | POA: Diagnosis present

## 2020-08-10 DIAGNOSIS — Z7984 Long term (current) use of oral hypoglycemic drugs: Secondary | ICD-10-CM

## 2020-08-10 DIAGNOSIS — O99214 Obesity complicating childbirth: Secondary | ICD-10-CM | POA: Diagnosis present

## 2020-08-10 DIAGNOSIS — O2412 Pre-existing diabetes mellitus, type 2, in childbirth: Secondary | ICD-10-CM | POA: Diagnosis present

## 2020-08-10 DIAGNOSIS — E119 Type 2 diabetes mellitus without complications: Secondary | ICD-10-CM | POA: Diagnosis not present

## 2020-08-10 DIAGNOSIS — O0990 Supervision of high risk pregnancy, unspecified, unspecified trimester: Secondary | ICD-10-CM

## 2020-08-10 DIAGNOSIS — Z283 Underimmunization status: Secondary | ICD-10-CM

## 2020-08-10 DIAGNOSIS — E669 Obesity, unspecified: Secondary | ICD-10-CM | POA: Diagnosis not present

## 2020-08-10 DIAGNOSIS — O99334 Smoking (tobacco) complicating childbirth: Secondary | ICD-10-CM | POA: Diagnosis present

## 2020-08-10 DIAGNOSIS — O134 Gestational [pregnancy-induced] hypertension without significant proteinuria, complicating childbirth: Secondary | ICD-10-CM | POA: Diagnosis present

## 2020-08-10 DIAGNOSIS — I10 Essential (primary) hypertension: Secondary | ICD-10-CM | POA: Diagnosis not present

## 2020-08-10 DIAGNOSIS — E1165 Type 2 diabetes mellitus with hyperglycemia: Secondary | ICD-10-CM | POA: Diagnosis present

## 2020-08-10 DIAGNOSIS — O09899 Supervision of other high risk pregnancies, unspecified trimester: Secondary | ICD-10-CM

## 2020-08-10 DIAGNOSIS — Z98891 History of uterine scar from previous surgery: Secondary | ICD-10-CM

## 2020-08-10 DIAGNOSIS — O42913 Preterm premature rupture of membranes, unspecified as to length of time between rupture and onset of labor, third trimester: Principal | ICD-10-CM | POA: Diagnosis present

## 2020-08-10 DIAGNOSIS — O99210 Obesity complicating pregnancy, unspecified trimester: Secondary | ICD-10-CM | POA: Diagnosis present

## 2020-08-10 DIAGNOSIS — O139 Gestational [pregnancy-induced] hypertension without significant proteinuria, unspecified trimester: Secondary | ICD-10-CM | POA: Diagnosis present

## 2020-08-10 MED ORDER — BUPRENORPHINE HCL-NALOXONE HCL 8-2 MG SL FILM
8.0000 mg | ORAL_FILM | Freq: Three times a day (TID) | SUBLINGUAL | 0 refills | Status: DC
Start: 2020-08-10 — End: 2020-08-24

## 2020-08-10 NOTE — Telephone Encounter (Signed)
Called patient as we had been discussing possibly changing dose, she would like to hold off at this time. Refill sent.

## 2020-08-10 NOTE — Procedures (Signed)
AMBRIELLE KINGTON [birth year] [redacted]w[redacted]d  Fetus A Non-Stress Test Interpretation for 08/10/20  Indication: Diabetes  Fetal Heart Rate A Mode: External Baseline Rate (A): 145 bpm Variability: Moderate Accelerations: 10 x 10 Decelerations: None Multiple birth?: No  Uterine Activity Mode: Palpation,Toco Contraction Frequency (min): none noted Resting Tone Palpated: Relaxed Resting Time: Adequate  Interpretation (Fetal Testing) Nonstress Test Interpretation: Non-reactive Comments: Reviewed tracing with Dr. Judeth Cornfield

## 2020-08-10 NOTE — Progress Notes (Signed)
Patient given Doctor, general practice, [place].

## 2020-08-10 NOTE — Telephone Encounter (Signed)
Takyra called today and left a voicemail she is calling about her refill of her suboxone which is due to be picked up today. She also said she missed a call from [place].  Lynnell Fiumara,RN Discussed refill request with Dr. Crissie Reese and he will follow up with [facility] and call her. Emelly Wurtz,RN

## 2020-08-11 ENCOUNTER — Other Ambulatory Visit: Payer: Self-pay

## 2020-08-11 ENCOUNTER — Encounter (HOSPITAL_COMMUNITY): Payer: Self-pay | Admitting: Obstetrics and Gynecology

## 2020-08-11 ENCOUNTER — Other Ambulatory Visit (HOSPITAL_COMMUNITY): Payer: Medicaid Other | Attending: Family Medicine

## 2020-08-11 ENCOUNTER — Inpatient Hospital Stay (HOSPITAL_COMMUNITY): Payer: Medicaid Other | Admitting: Anesthesiology

## 2020-08-11 DIAGNOSIS — O24429 Gestational diabetes mellitus in childbirth, unspecified control: Secondary | ICD-10-CM | POA: Diagnosis not present

## 2020-08-11 DIAGNOSIS — O2412 Pre-existing diabetes mellitus, type 2, in childbirth: Secondary | ICD-10-CM | POA: Diagnosis not present

## 2020-08-11 DIAGNOSIS — O42913 Preterm premature rupture of membranes, unspecified as to length of time between rupture and onset of labor, third trimester: Secondary | ICD-10-CM | POA: Diagnosis not present

## 2020-08-11 DIAGNOSIS — Z7984 Long term (current) use of oral hypoglycemic drugs: Secondary | ICD-10-CM | POA: Diagnosis not present

## 2020-08-11 DIAGNOSIS — O134 Gestational [pregnancy-induced] hypertension without significant proteinuria, complicating childbirth: Secondary | ICD-10-CM | POA: Diagnosis not present

## 2020-08-11 DIAGNOSIS — F419 Anxiety disorder, unspecified: Secondary | ICD-10-CM | POA: Diagnosis not present

## 2020-08-11 DIAGNOSIS — O4593 Premature separation of placenta, unspecified, third trimester: Secondary | ICD-10-CM | POA: Diagnosis not present

## 2020-08-11 DIAGNOSIS — E1165 Type 2 diabetes mellitus with hyperglycemia: Secondary | ICD-10-CM | POA: Diagnosis not present

## 2020-08-11 DIAGNOSIS — O411230 Chorioamnionitis, third trimester, not applicable or unspecified: Secondary | ICD-10-CM | POA: Diagnosis not present

## 2020-08-11 DIAGNOSIS — Z3A00 Weeks of gestation of pregnancy not specified: Secondary | ICD-10-CM | POA: Diagnosis not present

## 2020-08-11 DIAGNOSIS — O0990 Supervision of high risk pregnancy, unspecified, unspecified trimester: Secondary | ICD-10-CM

## 2020-08-11 DIAGNOSIS — O411290 Chorioamnionitis, unspecified trimester, not applicable or unspecified: Secondary | ICD-10-CM | POA: Diagnosis not present

## 2020-08-11 DIAGNOSIS — O403XX0 Polyhydramnios, third trimester, not applicable or unspecified: Secondary | ICD-10-CM | POA: Diagnosis not present

## 2020-08-11 DIAGNOSIS — O99334 Smoking (tobacco) complicating childbirth: Secondary | ICD-10-CM | POA: Diagnosis not present

## 2020-08-11 DIAGNOSIS — O99214 Obesity complicating childbirth: Secondary | ICD-10-CM | POA: Diagnosis not present

## 2020-08-11 DIAGNOSIS — O99344 Other mental disorders complicating childbirth: Secondary | ICD-10-CM | POA: Diagnosis not present

## 2020-08-11 DIAGNOSIS — O24119 Pre-existing diabetes mellitus, type 2, in pregnancy, unspecified trimester: Secondary | ICD-10-CM | POA: Diagnosis present

## 2020-08-11 DIAGNOSIS — U071 COVID-19: Secondary | ICD-10-CM | POA: Diagnosis not present

## 2020-08-11 DIAGNOSIS — O139 Gestational [pregnancy-induced] hypertension without significant proteinuria, unspecified trimester: Secondary | ICD-10-CM | POA: Diagnosis present

## 2020-08-11 DIAGNOSIS — Z3A36 36 weeks gestation of pregnancy: Secondary | ICD-10-CM | POA: Diagnosis not present

## 2020-08-11 DIAGNOSIS — F17210 Nicotine dependence, cigarettes, uncomplicated: Secondary | ICD-10-CM | POA: Diagnosis not present

## 2020-08-11 DIAGNOSIS — O9852 Other viral diseases complicating childbirth: Secondary | ICD-10-CM | POA: Diagnosis not present

## 2020-08-11 DIAGNOSIS — O26893 Other specified pregnancy related conditions, third trimester: Secondary | ICD-10-CM | POA: Diagnosis not present

## 2020-08-11 DIAGNOSIS — O99324 Drug use complicating childbirth: Secondary | ICD-10-CM | POA: Diagnosis not present

## 2020-08-11 DIAGNOSIS — O34219 Maternal care for unspecified type scar from previous cesarean delivery: Secondary | ICD-10-CM | POA: Diagnosis not present

## 2020-08-11 DIAGNOSIS — Z8759 Personal history of other complications of pregnancy, childbirth and the puerperium: Secondary | ICD-10-CM | POA: Diagnosis present

## 2020-08-11 DIAGNOSIS — F1110 Opioid abuse, uncomplicated: Secondary | ICD-10-CM | POA: Diagnosis present

## 2020-08-11 DIAGNOSIS — F331 Major depressive disorder, recurrent, moderate: Secondary | ICD-10-CM | POA: Diagnosis not present

## 2020-08-11 LAB — COMPREHENSIVE METABOLIC PANEL
ALT: 18 U/L (ref 0–44)
AST: 25 U/L (ref 15–41)
Albumin: 2.1 g/dL — ABNORMAL LOW (ref 3.5–5.0)
Alkaline Phosphatase: 119 U/L (ref 38–126)
Anion gap: 8 (ref 5–15)
BUN: 9 mg/dL (ref 6–20)
CO2: 22 mmol/L (ref 22–32)
Calcium: 8.4 mg/dL — ABNORMAL LOW (ref 8.9–10.3)
Chloride: 104 mmol/L (ref 98–111)
Creatinine, Ser: 0.47 mg/dL (ref 0.44–1.00)
GFR, Estimated: >60 mL/min (ref >60)
Glucose, Bld: 190 mg/dL — ABNORMAL HIGH (ref 70–99)
Potassium: 4.3 mmol/L (ref 3.5–5.1)
Sodium: 134 mmol/L — ABNORMAL LOW (ref 135–145)
Total Bilirubin: 0.5 mg/dL (ref 0.3–1.2)
Total Protein: 5.6 g/dL — ABNORMAL LOW (ref 6.5–8.1)

## 2020-08-11 LAB — GLUCOSE, CAPILLARY
Glucose-Capillary: 178 mg/dL — ABNORMAL HIGH (ref 70–99)
Glucose-Capillary: 202 mg/dL — ABNORMAL HIGH (ref 70–99)
Glucose-Capillary: 169 mg/dL — ABNORMAL HIGH (ref 70–99)
Glucose-Capillary: 171 mg/dL — ABNORMAL HIGH (ref 70–99)
Glucose-Capillary: 176 mg/dL — ABNORMAL HIGH (ref 70–99)
Glucose-Capillary: 137 mg/dL — ABNORMAL HIGH (ref 70–99)
Glucose-Capillary: 64 mg/dL — ABNORMAL LOW (ref 70–99)
Glucose-Capillary: 98 mg/dL (ref 70–99)
Glucose-Capillary: 117 mg/dL — ABNORMAL HIGH (ref 70–99)
Glucose-Capillary: 89 mg/dL (ref 70–99)
Glucose-Capillary: 124 mg/dL — ABNORMAL HIGH (ref 70–99)
Glucose-Capillary: 102 mg/dL — ABNORMAL HIGH (ref 70–99)
Glucose-Capillary: 109 mg/dL — ABNORMAL HIGH (ref 70–99)
Glucose-Capillary: 123 mg/dL — ABNORMAL HIGH (ref 70–99)
Glucose-Capillary: 113 mg/dL — ABNORMAL HIGH (ref 70–99)
Glucose-Capillary: 67 mg/dL — ABNORMAL LOW (ref 70–99)
Glucose-Capillary: 78 mg/dL (ref 70–99)
Glucose-Capillary: 84 mg/dL (ref 70–99)

## 2020-08-11 LAB — CBC
HCT: 34.2 % — ABNORMAL LOW (ref 36.0–46.0)
Hemoglobin: 11.8 g/dL — ABNORMAL LOW (ref 12.0–15.0)
MCH: 30.1 pg (ref 26.0–34.0)
MCHC: 34.5 g/dL (ref 30.0–36.0)
MCV: 87.2 fL (ref 80.0–100.0)
Platelets: 244 10*3/uL (ref 150–400)
RBC: 3.92 MIL/uL (ref 3.87–5.11)
RDW: 13.2 % (ref 11.5–15.5)
WBC: 8.5 10*3/uL (ref 4.0–10.5)
nRBC: 0 % (ref 0.0–0.2)

## 2020-08-11 LAB — TYPE AND SCREEN
ABO/RH(D): A POS
Antibody Screen: NEGATIVE

## 2020-08-11 LAB — POCT FERN TEST: POCT Fern Test: POSITIVE

## 2020-08-11 LAB — HEMOGLOBIN A1C
Hgb A1c MFr Bld: 6.6 % — ABNORMAL HIGH (ref 4.8–5.6)
Mean Plasma Glucose: 142.72 mg/dL

## 2020-08-11 LAB — RESP PANEL BY RT-PCR (FLU A&B, COVID) ARPGX2
Influenza A by PCR: NEGATIVE
Influenza B by PCR: NEGATIVE
SARS Coronavirus 2 by RT PCR: POSITIVE — AB

## 2020-08-11 LAB — GROUP B STREP BY PCR: Group B strep by PCR: POSITIVE — AB

## 2020-08-11 LAB — RPR: RPR Ser Ql: NONREACTIVE

## 2020-08-11 MED ORDER — OXYCODONE-ACETAMINOPHEN 5-325 MG PO TABS: 1.0000 | ORAL_TABLET | ORAL | Status: DC | PRN

## 2020-08-11 MED ORDER — LACTATED RINGERS IV SOLN: 500.0000 mL | INTRAVENOUS | Status: DC | PRN

## 2020-08-11 MED ORDER — ONDANSETRON HCL 4 MG/2ML IJ SOLN
4.0000 mg | Freq: Four times a day (QID) | INTRAMUSCULAR | Status: DC | PRN
  Administered 2020-08-11: 4 mg via INTRAVENOUS
  Filled 2020-08-11: qty 2

## 2020-08-11 MED ORDER — OXYTOCIN-SODIUM CHLORIDE 30-0.9 UT/500ML-% IV SOLN
2.5000 [IU]/h | INTRAVENOUS | Status: DC
  Administered 2020-08-12: 2.5 [IU]/h via INTRAVENOUS
  Filled 2020-08-11 (×2): qty 500

## 2020-08-11 MED ORDER — PENICILLIN G POT IN DEXTROSE 60000 UNIT/ML IV SOLN
3.0000 10*6.[IU] | INTRAVENOUS | Status: DC
  Administered 2020-08-11 (×5): 3 10*6.[IU] via INTRAVENOUS
  Filled 2020-08-11 (×5): qty 50

## 2020-08-11 MED ORDER — OXYTOCIN BOLUS FROM INFUSION
333.0000 mL | Freq: Once | INTRAVENOUS | Status: AC
  Administered 2020-08-12: 333 mL via INTRAVENOUS

## 2020-08-11 MED ORDER — SODIUM CHLORIDE 0.9 % IV SOLN
5.0000 10*6.[IU] | Freq: Once | INTRAVENOUS | Status: AC
  Administered 2020-08-11: 5 10*6.[IU] via INTRAVENOUS
  Filled 2020-08-11: qty 5

## 2020-08-11 MED ORDER — DEXTROSE 50 % IV SOLN: 0.0000 mL | INTRAVENOUS | Status: DC | PRN

## 2020-08-11 MED ORDER — FENTANYL-BUPIVACAINE-NACL 0.5-0.125-0.9 MG/250ML-% EP SOLN
12.0000 mL/h | EPIDURAL | Status: DC | PRN
  Filled 2020-08-11: qty 250

## 2020-08-11 MED ORDER — SODIUM CHLORIDE (PF) 0.9 % IJ SOLN
INTRAMUSCULAR | Status: DC | PRN
  Administered 2020-08-11: 12 mL/h via EPIDURAL

## 2020-08-11 MED ORDER — TERBUTALINE SULFATE 1 MG/ML IJ SOLN: 0.2500 mg | Freq: Once | INTRAMUSCULAR | Status: DC | PRN

## 2020-08-11 MED ORDER — DIPHENHYDRAMINE HCL 50 MG/ML IJ SOLN: 12.5000 mg | INTRAMUSCULAR | Status: DC | PRN

## 2020-08-11 MED ORDER — LIDOCAINE HCL (PF) 1 % IJ SOLN: 30.0000 mL | INTRAMUSCULAR | Status: DC | PRN

## 2020-08-11 MED ORDER — PHENYLEPHRINE 40 MCG/ML (10ML) SYRINGE FOR IV PUSH (FOR BLOOD PRESSURE SUPPORT): 80.0000 ug | PREFILLED_SYRINGE | INTRAVENOUS | Status: DC | PRN

## 2020-08-11 MED ORDER — CALCIUM CARBONATE ANTACID 500 MG PO CHEW: 1.0000 | CHEWABLE_TABLET | Freq: Three times a day (TID) | ORAL | Status: DC | PRN

## 2020-08-11 MED ORDER — FENTANYL CITRATE (PF) 100 MCG/2ML IJ SOLN
100.0000 ug | INTRAMUSCULAR | Status: DC | PRN
Start: 2020-08-11 — End: 2020-08-12

## 2020-08-11 MED ORDER — PHENYLEPHRINE 40 MCG/ML (10ML) SYRINGE FOR IV PUSH (FOR BLOOD PRESSURE SUPPORT)
80.0000 ug | PREFILLED_SYRINGE | INTRAVENOUS | Status: DC | PRN
  Filled 2020-08-11: qty 10

## 2020-08-11 MED ORDER — OXYTOCIN-SODIUM CHLORIDE 30-0.9 UT/500ML-% IV SOLN
1.0000 m[IU]/min | INTRAVENOUS | Status: DC
  Administered 2020-08-11: 2 m[IU]/min via INTRAVENOUS

## 2020-08-11 MED ORDER — SOD CITRATE-CITRIC ACID 500-334 MG/5ML PO SOLN: 30.0000 mL | ORAL | Status: DC | PRN

## 2020-08-11 MED ORDER — LACTATED RINGERS IV SOLN: INTRAVENOUS | Status: DC

## 2020-08-11 MED ORDER — OXYCODONE-ACETAMINOPHEN 5-325 MG PO TABS: 2.0000 | ORAL_TABLET | ORAL | Status: DC | PRN

## 2020-08-11 MED ORDER — LIDOCAINE HCL (PF) 1 % IJ SOLN
INTRAMUSCULAR | Status: DC | PRN
  Administered 2020-08-11: 5 mL via EPIDURAL

## 2020-08-11 MED ORDER — LACTATED RINGERS IV SOLN
500.0000 mL | Freq: Once | INTRAVENOUS | Status: AC
  Administered 2020-08-11: 500 mL via INTRAVENOUS

## 2020-08-11 MED ORDER — OXYTOCIN-SODIUM CHLORIDE 30-0.9 UT/500ML-% IV SOLN: 1.0000 m[IU]/min | INTRAVENOUS | Status: DC

## 2020-08-11 MED ORDER — DEXTROSE IN LACTATED RINGERS 5 % IV SOLN: INTRAVENOUS | Status: DC

## 2020-08-11 MED ORDER — ACETAMINOPHEN 325 MG PO TABS: 650.0000 mg | ORAL_TABLET | ORAL | Status: DC | PRN

## 2020-08-11 MED ORDER — EPHEDRINE 5 MG/ML INJ: 10.0000 mg | INTRAVENOUS | Status: DC | PRN

## 2020-08-11 MED ORDER — BUPRENORPHINE HCL-NALOXONE HCL 8-2 MG SL SUBL
1.0000 | SUBLINGUAL_TABLET | Freq: Three times a day (TID) | SUBLINGUAL | Status: DC
  Administered 2020-08-11 – 2020-08-13 (×8): 1 via SUBLINGUAL
  Filled 2020-08-11 (×8): qty 1

## 2020-08-11 MED ORDER — INSULIN REGULAR(HUMAN) IN NACL 100-0.9 UT/100ML-% IV SOLN
INTRAVENOUS | Status: DC
  Administered 2020-08-11: 6 [IU]/h via INTRAVENOUS
  Filled 2020-08-11: qty 100

## 2020-08-11 MED ORDER — FLUOXETINE HCL 20 MG PO CAPS
20.0000 mg | ORAL_CAPSULE | Freq: Every day | ORAL | Status: DC
  Administered 2020-08-11 – 2020-08-13 (×3): 20 mg via ORAL
  Filled 2020-08-11 (×3): qty 1

## 2020-08-11 NOTE — MAU Note (Signed)
Pt presents via  EMS with complaint of ROM at 2300, some left sided abd pain. Pt reports good fetal movement

## 2020-08-11 NOTE — Progress Notes (Signed)
Labor Progress Note QUIARA KILLIAN is a 35 y.o. [phone] at [redacted]w[redacted]d presented for PPROM @2300  on 08/11/19.  S: Pt resting comfortably with epidural in place.  O:  BP 140/83   Pulse 97   Temp 97.7 F (36.5 C) (Oral)   Resp 16   Ht 5\' 4"  (1.626 m)   Wt 113.4 kg   LMP 09/11/2019   SpO2 99%   BMI 42.91 kg/m  EFM: baseline 130bpm/mod variability/+accels/intermitent variable decels Toco: every 3-4 min, 150 MVUs  CVE: Dilation: 6 Effacement (%): 90 Station: 0 Presentation: Vertex Exam by:: Dr. 11/09/2019   A&P: 35 y.o. Barb Merino [redacted]w[redacted]d presented for PPROM @2300  on 08/11/19. #PPROM: S/p FB. Pitocin @26cc /hr. Inadequate contraction pattern by IUPC. Pt continues to make slow cervical change. Will plan to recheck in 4 hours or sooner as clinically indicated. #Pain: epidural in palce #FWB: Category 2 strip given mild intermittent variable decels; reassuringly moderate variability and good recovery #GBS positive on PCR on admit, PCN adequate #COVID: diagnosed on admission, asymptomatic. Continue to monitor. Airborne and contact precautions. #gHTN: meets criteria based on BP since admission. Not on meds. Nml labs. #T2DM: q2 BGL, on endotool, last BGL 124 >102 >109 >123 > 113. 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 SW postpartum.  [redacted]w[redacted]d, MD OB Fellow, Faculty Practice 08/11/2020 9:46 PM

## 2020-08-11 NOTE — Anesthesia Procedure Notes (Addendum)
Epidural Patient location during procedure: OB Start time: 08/11/2020 10:14 AM End time: 08/11/2020 10:32 AM  Staffing Anesthesiologist: Trevor Iha, MD Performed: anesthesiologist   Preanesthetic Checklist Completed: patient identified, IV checked, site marked, risks and benefits discussed, surgical consent, monitors and equipment checked, pre-op evaluation and timeout performed  Epidural Patient position: sitting Prep: DuraPrep and site prepped and draped Patient monitoring: continuous pulse ox and blood pressure Approach: midline Location: L4-L5 Injection technique: LOR air  Needle:  Needle type: Tuohy  Needle gauge: 17 G Needle length: 9 cm and 9 Needle insertion depth: 9 cm Catheter type: closed end flexible Catheter size: 19 Gauge Catheter at skin depth: 15 and 16 cm Test dose: negative  Assessment Events: blood not aspirated, injection not painful, no injection resistance, no paresthesia and negative IV test  Additional Notes Patient identified. Risks/Benefits/Options discussed with patient including but not limited to bleeding, infection, nerve damage, paralysis, failed block, incomplete pain control, headache, blood pressure changes, nausea, vomiting, reactions to medication both or allergic, itching and postpartum back pain. Confirmed with bedside nurse the patient's most recent platelet count. Confirmed with patient that they are not currently taking any anticoagulation, have any bleeding history or any family history of bleeding disorders. Patient expressed understanding and wished to proceed. All questions were answered. Sterile technique was used throughout the entire procedure. Please see nursing notes for vital signs. Test dose was given through epidural needle and negative prior to continuing to dose epidural or start infusion. Warning signs of high block given to the patient including shortness of breath, tingling/numbness in hands, complete motor block, or any  concerning symptoms with instructions to call for help. Patient was given instructions on fall risk and not to get out of bed. All questions and concerns addressed with instructions to call with any issues.2  Attempt (S) . Patient tolerated procedure well.

## 2020-08-11 NOTE — Progress Notes (Signed)
Labor Progress Note SHAKIYA MCNEARY is a 35 y.o. [phone] at [redacted]w[redacted]d presented for PPROM @2300  on 08/11/19. S: Doing well without complaints.  O:  BP (!) 142/79   Pulse 98   Temp 98 F (36.7 C) (Oral)   Resp 18   Ht 5\' 4"  (1.626 m)   Wt 113.4 kg   LMP 09/11/2019   SpO2 99%   BMI 42.91 kg/m  EFM: baseline 140bpm/mod variability/+accels/no decels Toco: irritable  CVE: Dilation: 4 Effacement (%): 50 Station: -3 Presentation: Vertex Exam by:: 11/09/2019, RN   A&P: 35 y.o. Earlene Plater [redacted]w[redacted]d presented for PPROM @2300  on 08/11/19. #PPROM: F/p FB dislodged with this exam. Start pitocin 2x2, continue to titrate as able.  #Pain: PRN, desires epidural  #FWB: cat 1 #GBS not done, PCR and culture pending, PCN adequte #COVID: diagnosed on admission, asymptomatic. Continue to monitor. #gHTN: meets criteria based on BP since admission. Not on meds. Nml labs. 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 SW postpartum.  [redacted]w[redacted]d, MD 10:22 AM

## 2020-08-11 NOTE — Progress Notes (Signed)
Labor Progress Note Krystal [name] is a 35 y.o. [phone] at [redacted]w[redacted]d presented for PPROM @2300  on 08/11/19. S: Doing well without complaints. Strip reviewed and discussed with RN.  O:  BP 126/79   Pulse 87   Temp 97.9 F (36.6 C) (Oral)   Resp 16   Ht 5\' 4"  (1.626 m)   Wt 113.4 kg   LMP 09/11/2019   SpO2 99%   BMI 42.91 kg/m  EFM: baseline 120bpm/mod variability/+accels/no decels Toco: irritable  CVE: Dilation: 4 Effacement (%): 60 Station: -3 Presentation: Vertex Exam by:: Davis,RN   A&P: 35 y.o. [phone] [redacted]w[redacted]d presented for PPROM @2300  on 08/11/19. #PPROM: S/p FB, pitocin now at 93mu/m. Plan to continue to titrate pitocin, and if no change at next check, plan to AROM and add IUPC. #Pain: epidural #FWB: cat 1 #GBS not done, PCR and culture pending, PCN adequate #COVID: diagnosed on admission, asymptomatic. Continue to monitor. #gHTN: meets criteria based on BP since admission. Not on meds. Nml labs. Most recent BP WNL. #T2DM: q2 BGL, on endotool, BGL not well controlled. Most recent appropriate at 117. 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 SW postpartum.  , MD 2:45 PM

## 2020-08-11 NOTE — Progress Notes (Signed)
Labor Progress Note Krystal [name] is a 35 y.o. [phone] at [redacted]w[redacted]d presented for PPROM @2300  on 08/11/19. S: Doing well without complaints.  O:  BP 113/77   Pulse 92   Temp 97.9 F (36.6 C) (Oral)   Resp 16   Ht 5\' 4"  (1.626 m)   Wt 113.4 kg   LMP 09/11/2019   SpO2 99%   BMI 42.91 kg/m  EFM: baseline 130bpm/mod variability/+accels/no decels Toco: difficult to trace  CVE: Dilation: 5 Effacement (%): 60 Station: -3 Presentation: Vertex Exam by:: Dr. 11/09/2019   A&P: 36 y.o. [name] [redacted]w[redacted]d presented for PPROM @2300  on 08/11/19. #PPROM: S/p FB. Pitocin @18cc /hr. Some cervical change since last exam, IUPC placed given TOLAC. Continue to titrate as able. #Pain: epidural  #FWB: cat 1 #GBS positive on PCR on admit, PCN adequte #COVID: diagnosed on admission, asymptomatic. Continue to monitor. #gHTN: meets criteria based on BP since admission. Not on meds. Nml labs. #T2DM: q2 BGL, on endotool, last BGL 124. 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 SW postpartum.  [redacted]w[redacted]d, MD 4:51 PM

## 2020-08-11 NOTE — Anesthesia Preprocedure Evaluation (Signed)
Anesthesia Evaluation  Patient identified by MRN, date of birth, ID band Patient awake    Reviewed: Allergy & Precautions, NPO status , Patient's Chart, lab work & pertinent test results  Airway Mallampati: II  TM Distance: >3 FB Neck ROM: Full    Dental no notable dental hx. (+) Teeth Intact, Dental Advisory Given   Pulmonary Current Smoker,  Covid +   Pulmonary exam normal breath sounds clear to auscultation       Cardiovascular Exercise Tolerance: Good Normal cardiovascular exam Rhythm:Regular Rate:Normal     Neuro/Psych  Headaches, PSYCHIATRIC DISORDERS Depression    GI/Hepatic GERD  ,(+)     substance abuse  , On Suboxone   Endo/Other  diabetes, Poorly Controlled, Type 2  Renal/GU      Musculoskeletal negative musculoskeletal ROS (+)   Abdominal (+) + obese,   Peds  Hematology Lab Results      Component                Value               Date                      WBC                      8.5                 08/11/2020                HGB                      11.8 (L)            08/11/2020                HCT                      34.2 (L)            08/11/2020                MCV                      87.2                08/11/2020                PLT                      244                 08/11/2020              Anesthesia Other Findings   Reproductive/Obstetrics (+) Pregnancy                             Anesthesia Physical Anesthesia Plan  ASA: III  Anesthesia Plan: Epidural   Post-op Pain Management:    Induction:   PONV Risk Score and Plan:   Airway Management Planned: Nasal Cannula  Additional Equipment:   Intra-op Plan:   Post-operative Plan:   Informed Consent: I have reviewed the patients History and Physical, chart, labs and discussed the procedure including the risks, benefits and alternatives for the proposed anesthesia with the patient or authorized  representative who has indicated his/her understanding and acceptance.  Plan Discussed with: CRNA  Anesthesia Plan Comments: (36.5 Wk G4P3 w hx of covid +, Substance abuse , inc BMI, and DM2 poorly controlled for [facility])        Anesthesia Quick Evaluation

## 2020-08-11 NOTE — H&P (Signed)
OBSTETRIC ADMISSION HISTORY AND PHYSICAL  Krystal [name] is a 35 y.o. female [phone] with IUP at 38w5dby 17 week ultrasound presenting for SROM at 2300 on 08/10/20. She reports +FMs, No LOF, no VB, no blurry vision, headaches or peripheral edema, and RUQ pain.  She plans on breast feeding. She is undecided for birth control.  [facility] --->  Estimated Date of Delivery: 09/03/20  Sono:  _0 , CWD, normal anatomy, cephalic presentation, [id number] 99% EFW  Prenatal History/Complications:  - TV9TY(diet-controlled) - Tobacco use in pregnancy - Depression (on prozac 236mdaily) - H/o shoulder dystocia (fetal birth weight: 3997g) - H/o Cesarean x1 (secondary to h/o shoulder dystocia with LGA infant, fetal birth weight 4330g) - H/o VBAC x1 (fetal birth weight: 3476g) - COVID Infection (asymptomatic; diagnosed 08/11/20 on admission) - Opioid Use Disorder (taking suboxone 8-2 TID; most recently prescribed by OBGYN)  Past Medical History: Past Medical History:  Diagnosis Date  . Left ACL tear 07/2015  . Medial meniscus tear 07/2015   left knee  . Non-insulin dependent type 2 diabetes mellitus (HCC)    oral  . Obesity   . Substance abuse ([place]   percocet, currently on subutex    Past Surgical History: Past Surgical History:  Procedure Laterality Date  . ANTERIOR CRUCIATE LIGAMENT REPAIR Left 07/23/2015   Procedure: RECONSTRUCTION ANTERIOR CRUCIATE LIGAMENT (ACL) WITH GRAFTLINK HAMSTRING GRAFT;  Surgeon: TiRenette ButtersMD;  [place] [facility] [place]: [place];  Laterality: Left;  . CESAREAN SECTION N/A 08/18/2013   Procedure: CESAREAN SECTION;  Surgeon: TaDonnamae JudeMD;  [facility];  Service: Obstetrics;  Laterality: N/A;  . EXCISION BONE CYST     gum  . KNEE ARTHROSCOPY WITH MEDIAL MENISECTOMY Left 07/23/2015   Procedure: LEFT KNEE ARTHROSCOPY WITH PARTIAL MEDIAL MENISCECTOMY ;  Surgeon:  TiRenette ButtersMD;  [place] [facility];  Laterality: Left;  . KNEE ARTHROSCOPY WITH MENISCAL REPAIR Left 07/23/2015   Procedure: LATERAL MENISCAL REPAIR;  Surgeon: TiRenette ButtersMD;  [place] [facility];  Laterality: Left;    Obstetrical History: OB History    Gravida  4   Para  3   Term  3   Preterm      AB      Living  3     SAB      IAB      Ectopic      Multiple  0   Live Births  3           Social History Social History   Socioeconomic History  . Marital status: Legally Separated    Spouse name: Not on file  . Number of children: Not on file  . Years of education: Not on file  . Highest education level: Not on file  Occupational History  . Not on file  Tobacco Use  . Smoking status: Current Every Day Smoker    Packs/day: 0.50    Years: 10.00    Pack years: 5.00    Types: Cigarettes  . Smokeless tobacco: Never Used  Vaping Use  . Vaping Use: Never used  Substance and Sexual Activity  . Alcohol use: No  . Drug use: No    Comment: Hx percocet use, on subutex  . Sexual activity: Yes    Birth control/protection: None  Other Topics Concern  .  Not on file  Social History Narrative  . Not on file   Social Determinants of Health   Financial Resource Strain: Not on file  Food Insecurity: Food Insecurity Present  . Worried About Charity fundraiser in the Last Year: Sometimes true  . Ran Out of Food in the Last Year: Sometimes true  Transportation Needs: Unmet Transportation Needs  . Lack of Transportation (Medical): Yes  . Lack of Transportation (Non-Medical): Yes  Physical Activity: Not on file  Stress: Not on file  Social Connections: Not on file    Family History: Family History  Problem Relation Age of Onset  . Asthma Mother   . Diabetes Mother   . Alcohol abuse Mother   . Stroke Mother     Allergies: No Known Allergies  Medications Prior to  Admission  Medication Sig Dispense Refill Last Dose  . aspirin EC 81 MG tablet Take 1 tablet (81 mg total) by mouth daily. 60 tablet 1 08/10/2020 at Unknown time  . Buprenorphine HCl-Naloxone HCl (SUBOXONE) 8-2 MG FILM Place 1 Film under the tongue 3 (three) times daily. 42 each 0 08/10/2020 at Unknown time  . FLUoxetine (PROZAC) 20 MG capsule Take 1 capsule (20 mg total) by mouth daily. 30 capsule 3 08/10/2020 at Unknown time  . pantoprazole (PROTONIX) 40 MG tablet Take 1 tablet (40 mg total) by mouth daily. 30 tablet 3 08/10/2020 at Unknown time  . Prenatal Vit-Fe Fumarate-FA (PREPLUS) 27-1 MG TABS Take 1 tablet by mouth daily. 60 tablet 1 08/10/2020 at Unknown time  . ACCU-CHEK GUIDE test strip Use as instructed THREE TIMES DAILY     . Accu-Chek Softclix Lancets lancets Use as instructed 100 each 12   . acetaminophen (TYLENOL) 500 MG tablet Take 1,000 mg by mouth every 6 (six) hours as needed for moderate pain.     . Blood Glucose Monitoring Suppl (ACCU-CHEK GUIDE) w/Device KIT 1 Device by Does not apply route daily. Use as directed 1 kit 0      Review of Systems   All systems reviewed and negative except as stated in HPI  Blood pressure 133/78, pulse (!) 108, temperature 98 F (36.7 C), temperature source Oral, resp. rate 18, height 5' 4" (1.626 m), weight 113.4 kg, last menstrual period 09/11/2019, SpO2 99 %. General appearance: alert, cooperative and appears stated age Lungs: normal WOB Heart: regular rate Abdomen: soft, non-tender Extremities: no sign of DVT Presentation: cephalic on bedside ultrasound Fetal monitoringBaseline: 135 bpm, Variability: Good {> 6 bpm), Accelerations: Reactive and Decelerations: Absent Uterine activity: irregular contractions    Prenatal labs: ABO, Rh: --/--/PENDING (01/05 0102) Antibody: PENDING (01/05 0102) Rubella: 11.80 (10/11 1645) RPR: Non Reactive (11/18 1022)  HBsAg: Negative (10/11 1645)  HIV: Non Reactive (11/18 1022)  GBS:   unknown >swab  collected on admission A1c: 9.3% in 10/2019 > 6.3% on 06/24/20 (pre-existing T2DM) Genetic screening wnl Anatomy US wnl  Prenatal Transfer Tool  Maternal Diabetes: Yes:  Diabetes Type:  Diet controlled Genetic Screening: Normal Maternal Ultrasounds/Referrals: Normal except for mild polyhydramnios, EFW 99% Fetal Ultrasounds or other Referrals:  Fetal echo wnl Maternal Substance Abuse:  Yes:  Type: Smoker, Opioid use disorder on suboxone Significant Maternal Medications:  Meds include: Other: suboxone Significant Maternal Lab Results: Other:   Results for orders placed or performed during the hospital encounter of 08/10/20 (from the past 24 hour(s))  POCT fern test   Collection Time: 08/11/20 12:12 AM  Result Value Ref Range   POCT [place]  Test Positive = ruptured amniotic membanes   Resp Panel by RT-PCR (Flu A&B, Covid) Nasopharyngeal Swab   Collection Time: 08/11/20 12:14 AM   Specimen: Nasopharyngeal Swab; Nasopharyngeal(NP) swabs in vial transport medium  Result Value Ref Range   SARS Coronavirus 2 by RT PCR POSITIVE (A) NEGATIVE   Influenza A by PCR NEGATIVE NEGATIVE   Influenza B by PCR NEGATIVE NEGATIVE  CBC   Collection Time: 08/11/20  1:02 AM  Result Value Ref Range   WBC 8.5 4.0 - 10.5 K/uL   RBC 3.92 3.87 - 5.11 MIL/uL   Hemoglobin 11.8 (L) 12.0 - 15.0 g/dL   HCT 34.2 (L) 36.0 - 46.0 %   MCV 87.2 80.0 - 100.0 fL   MCH 30.1 26.0 - 34.0 pg   MCHC 34.5 30.0 - 36.0 g/dL   RDW 13.2 11.5 - 15.5 %   Platelets 244 150 - 400 K/uL   nRBC 0.0 0.0 - 0.2 %  Type and screen   Collection Time: 08/11/20  1:02 AM  Result Value Ref Range   ABO/RH(D) PENDING    Antibody Screen PENDING    Sample Expiration      08/14/2020,2359 Performed at [facility], [place]. [address] [place], [place] [address]     Patient Active Problem List   Diagnosis Date Noted  . Type 2 diabetes mellitus in pregnancy 08/11/2020  . Supervision of high-risk pregnancy 08/11/2020  . BMI 40.0-44.9, adult  ([place]) 05/17/2020  . Pregnancy complicated by subutex maintenance, antepartum ([place]) 05/17/2020  . Late prenatal care 05/17/2020  . Acute pain of right knee 10/13/2019  . Uncontrolled type 2 diabetes mellitus without complication, without long-term current use of insulin 12/03/2018  . Moderate episode of recurrent major depressive disorder ([place]) 12/03/2018  . Pre-existing type 2 diabetes mellitus in pregnancy 10/19/2017  . Rubella non-immune status, antepartum 06/05/2017  . Supervision of high risk pregnancy, antepartum 06/04/2017  . H/O: substance abuse ([place]) 06/04/2017  . Bilateral knee pain 10/08/2014  . Acne 05/14/2014  . Alopecia 05/12/2014  . History of VBAC 08/18/2013  . GERD (gastroesophageal reflux disease) 08/30/2012  . HIDRADENITIS SUPPURATIVA 03/17/2009  . ECZEMA, ATOPIC 08/26/2007  . DEPRESSION, MILD 05/08/2007  . Obesity in pregnancy 10/04/2006  . TOBACCO DEPENDENCE 10/04/2006  . MIGRAINE, UNSPEC., W/O INTRACTABLE MIGRAINE 10/04/2006    Assessment/Plan:  ARCHER VISE is a 35 y.o. G4P3003 at 48w5dhere for labor management s/p SROM on 2300 on 08/10/20.  #TOLAC  H/o Cesarean x1  H/o VBAC x1: primary Cesarean scheduled secondary to h/o shoulder dystocia with LGA infant. Pelvis proven to 3476g with successful VBAC. Most recent EFW  Reviewed risks/benefits of TOLAC versus RCS in detail. Patient counseled regarding potential vaginal delivery, chance of success, future implications, possible uterine rupture and need for urgent/emergent repeat cesarean. Counseled regarding potential need for repeat c-section for reasons unrelated to first c-section. Counseled regarding scheduled repeat cesarean including risks of bleeding, infection, damage to surrounding tissue, abnormal placentation, implications for future pregnancies. All questions answered.  Patient desires TOLAC, consent signed 06/24/20.  #Pain: Plan for epidural per pt request #FWB: Category 1 strip #ID: GBS unknown  >PCN ordered on admission & swab collected #MOF: breast & bottle #MOC: undecided; counseled on admission #Circ: n/a  #H/o shoulder dystocia: occurred with first pregnancy; fetal birth weight: 3997g. Counseled pt on admission. Nursing aware. #COVID Infection: Diagnosed on 1/5 at time of admission. Asymptomatic. #T2DM (diet-controlled in pregnancy): pt previously on glyburide 2.559mBID and metformin 50037m  BID prior to to pregnancy. Normal fetal echo. Will monitor BG levels every 4 hours in latent labor and every 2 hours in active labor.  #Tobacco use in pregnancy: pt reports 1/2 ppd in pregnancy. Counseled on importance of complete cessation. #Opioid Use Disorder: suboxone 8-2 BID, most recently prescribed by prenatal provider. UDS on admission. #Depression: no current safety concerns; continue home prozac 50m daily.  GRanda Ngo MD OB Fellow, Faculty Practice 08/11/2020 2:40 AM

## 2020-08-11 NOTE — Progress Notes (Signed)
Inpatient Diabetes Program Recommendations  AACE/ADA: New Consensus Statement on Inpatient Glycemic Control (2015)  Target Ranges:  Prepandial:   less than 140 mg/dL      Peak postprandial:   less than 180 mg/dL (1-2 hours)      Critically ill patients:  140 - 180 mg/dL   Lab Results  Component Value Date   GLUCAP 171 (H) 08/11/2020   HGBA1C 6.6 (H) 08/11/2020    Review of Glycemic Control Results for Krystal [name], Krystal [name] (MRN [id number]) as of 08/11/2020 09:12  Ref. Range 08/11/2020 02:19 08/11/2020 06:25 08/11/2020 08:11 08/11/2020 09:13  Glucose-Capillary Latest Ref Range: 70 - 99 mg/dL 707 (H) 867 (H) 544 (H) 171 (H)   Diabetes history:  T2DM Outpatient Diabetes medications:  Diet Controlled Current orders for Inpatient glycemic control:  IV Insulin  Received consult for "evaluate DM management".  Agree with IV Insulin.  Will speak with patient after delivery and follow while inpatient.   Thank you, Dulce Sellar, RN, BSN Diabetes Coordinator Inpatient Diabetes Program [phone] (team pager from 8a-5p)

## 2020-08-12 ENCOUNTER — Other Ambulatory Visit (HOSPITAL_COMMUNITY): Payer: Self-pay | Admitting: Obstetrics and Gynecology

## 2020-08-12 ENCOUNTER — Encounter: Payer: Medicaid Other | Admitting: Obstetrics & Gynecology

## 2020-08-12 ENCOUNTER — Encounter (HOSPITAL_COMMUNITY): Payer: Self-pay | Admitting: Obstetrics and Gynecology

## 2020-08-12 DIAGNOSIS — Z3A36 36 weeks gestation of pregnancy: Secondary | ICD-10-CM

## 2020-08-12 DIAGNOSIS — O411230 Chorioamnionitis, third trimester, not applicable or unspecified: Secondary | ICD-10-CM

## 2020-08-12 DIAGNOSIS — O4593 Premature separation of placenta, unspecified, third trimester: Secondary | ICD-10-CM

## 2020-08-12 DIAGNOSIS — O34219 Maternal care for unspecified type scar from previous cesarean delivery: Secondary | ICD-10-CM

## 2020-08-12 LAB — GLUCOSE, CAPILLARY
Glucose-Capillary: 151 mg/dL — ABNORMAL HIGH (ref 70–99)
Glucose-Capillary: 168 mg/dL — ABNORMAL HIGH (ref 70–99)
Glucose-Capillary: 178 mg/dL — ABNORMAL HIGH (ref 70–99)
Glucose-Capillary: 79 mg/dL (ref 70–99)
Glucose-Capillary: 93 mg/dL (ref 70–99)

## 2020-08-12 LAB — DRUG PROFILE, UR, 9 DRUGS (LABCORP)
Amphetamines, Urine: NEGATIVE ng/mL
Barbiturate, Ur: NEGATIVE ng/mL
Benzodiazepine Quant, Ur: NEGATIVE ng/mL
Cannabinoid Quant, Ur: NEGATIVE ng/mL
Cocaine (Metab.): NEGATIVE ng/mL
Methadone Screen, Urine: NEGATIVE ng/mL
Opiate Quant, Ur: NEGATIVE ng/mL
Phencyclidine, Ur: NEGATIVE ng/mL
Propoxyphene, Urine: NEGATIVE ng/mL

## 2020-08-12 MED ORDER — COCONUT OIL OIL: 1.0000 "application " | TOPICAL_OIL | 0 refills | Status: DC | PRN

## 2020-08-12 MED ORDER — ONDANSETRON HCL 4 MG PO TABS: 4.0000 mg | ORAL_TABLET | ORAL | Status: DC | PRN

## 2020-08-12 MED ORDER — SENNOSIDES-DOCUSATE SODIUM 8.6-50 MG PO TABS
2.0000 | ORAL_TABLET | Freq: Every day | ORAL | Status: DC
  Administered 2020-08-13: 2 via ORAL
  Filled 2020-08-12: qty 2

## 2020-08-12 MED ORDER — METFORMIN HCL ER 500 MG PO TB24: 500.0000 mg | ORAL_TABLET | Freq: Two times a day (BID) | ORAL | 1 refills | Status: DC

## 2020-08-12 MED ORDER — DIPHENHYDRAMINE HCL 25 MG PO CAPS: 25.0000 mg | ORAL_CAPSULE | Freq: Four times a day (QID) | ORAL | Status: DC | PRN

## 2020-08-12 MED ORDER — METFORMIN HCL ER 500 MG PO TB24
500.0000 mg | ORAL_TABLET | Freq: Two times a day (BID) | ORAL | Status: DC
  Administered 2020-08-12 – 2020-08-13 (×2): 500 mg via ORAL
  Filled 2020-08-12 (×4): qty 1

## 2020-08-12 MED ORDER — IBUPROFEN 600 MG PO TABS
600.0000 mg | ORAL_TABLET | Freq: Four times a day (QID) | ORAL | Status: DC
  Administered 2020-08-12 – 2020-08-13 (×6): 600 mg via ORAL
  Filled 2020-08-12 (×6): qty 1

## 2020-08-12 MED ORDER — NIFEDIPINE ER OSMOTIC RELEASE 30 MG PO TB24
30.0000 mg | ORAL_TABLET | Freq: Every day | ORAL | Status: DC
  Administered 2020-08-12 – 2020-08-13 (×2): 30 mg via ORAL
  Filled 2020-08-12 (×2): qty 1

## 2020-08-12 MED ORDER — WITCH HAZEL-GLYCERIN EX PADS: 1.0000 "application " | MEDICATED_PAD | CUTANEOUS | Status: DC | PRN

## 2020-08-12 MED ORDER — NIFEDIPINE ER OSMOTIC RELEASE 30 MG PO TB24: 30.0000 mg | ORAL_TABLET | Freq: Every day | ORAL | 0 refills | Status: DC

## 2020-08-12 MED ORDER — COCONUT OIL OIL
1.0000 "application " | TOPICAL_OIL | Status: DC | PRN
  Administered 2020-08-12: 1 via TOPICAL

## 2020-08-12 MED ORDER — ACETAMINOPHEN 325 MG PO TABS: 650.0000 mg | ORAL_TABLET | Freq: Four times a day (QID) | ORAL | 0 refills | Status: DC

## 2020-08-12 MED ORDER — TETANUS-DIPHTH-ACELL PERTUSSIS 5-2.5-18.5 LF-MCG/0.5 IM SUSY: 0.5000 mL | PREFILLED_SYRINGE | Freq: Once | INTRAMUSCULAR | Status: DC

## 2020-08-12 MED ORDER — SIMETHICONE 80 MG PO CHEW: 80.0000 mg | CHEWABLE_TABLET | ORAL | Status: DC | PRN

## 2020-08-12 MED ORDER — ACETAMINOPHEN 325 MG PO TABS
650.0000 mg | ORAL_TABLET | Freq: Four times a day (QID) | ORAL | Status: DC
  Administered 2020-08-12 – 2020-08-13 (×6): 650 mg via ORAL
  Filled 2020-08-12 (×6): qty 2

## 2020-08-12 MED ORDER — PRENATAL MULTIVITAMIN CH
1.0000 | ORAL_TABLET | Freq: Every day | ORAL | Status: DC
  Administered 2020-08-12 – 2020-08-13 (×2): 1 via ORAL
  Filled 2020-08-12 (×2): qty 1

## 2020-08-12 MED ORDER — ONDANSETRON HCL 4 MG/2ML IJ SOLN: 4.0000 mg | INTRAMUSCULAR | Status: DC | PRN

## 2020-08-12 MED ORDER — IBUPROFEN 600 MG PO TABS: 600.0000 mg | ORAL_TABLET | Freq: Four times a day (QID) | ORAL | 0 refills | Status: DC

## 2020-08-12 MED ORDER — DIBUCAINE (PERIANAL) 1 % EX OINT: 1.0000 "application " | TOPICAL_OINTMENT | CUTANEOUS | Status: DC | PRN

## 2020-08-12 MED ORDER — BENZOCAINE-MENTHOL 20-0.5 % EX AERO
1.0000 "application " | INHALATION_SPRAY | CUTANEOUS | Status: DC | PRN
  Filled 2020-08-12: qty 56

## 2020-08-12 NOTE — Progress Notes (Signed)
Pt asleep RR=17. Pt will not wake to calling out her name loudly. MGM present . This RN will call MD but at this time did not give Pt her suboxone.

## 2020-08-12 NOTE — Discharge Instructions (Signed)

## 2020-08-12 NOTE — Progress Notes (Signed)
Spoke to [employer], [place], dosage frequency to be discussed. Will hold 1600 dose for now

## 2020-08-12 NOTE — Progress Notes (Signed)
Blood sugars were taken through out the day after meals. Future blood sugars will be taken before meals, after clarification of orders.

## 2020-08-12 NOTE — Lactation Note (Signed)
This note was copied from a baby's chart. Lactation Consultation Note  Patient Name: Girl Brentney Goldbach [id number] Date: 08/12/2020 Reason for consult: (P) Initial assessment;NICU baby;Late-preterm 34-36.6wks Age:33 hours  This LC received 1 vile of colostrum from [facility] [place]ie in Mother's room. Delivered to infant's room in NICU and handed off to RN.    Elder Negus, MA IBCLC 08/12/2020, 2:33 PM

## 2020-08-12 NOTE — Discharge Summary (Signed)
Postpartum Discharge Summary  Date of Service updated-yes     Patient Name: Krystal [name] DOB: 16-May-1986 MRN: [id number]  Date of admission: 08/10/2020 Delivery date:08/12/2020  Delivering provider: Randa Ngo  Date of discharge: 08/13/2020  Admitting diagnosis: Type 2 diabetes mellitus in pregnancy [O24.119] Supervision of high-risk pregnancy [O09.90] Intrauterine pregnancy: [redacted]w[redacted]d    Secondary diagnosis:  Principal Problem:   VBAC (vaginal birth after Cesarean) Active Problems:   Obesity in pregnancy   TOBACCO DEPENDENCE   History of VBAC   Type 2 diabetes mellitus (HCC)   Moderate episode of recurrent major depressive disorder ([place]   Pregnancy complicated by subutex maintenance, antepartum ([place]   Type 2 diabetes mellitus in pregnancy   Supervision of high-risk pregnancy   Gestational hypertension  Additional problems: as noted above  Discharge diagnosis: VBAC                                            Post partum procedures:none Augmentation: Pitocin and IP Foley Complications: suspected partial abruption given several large clots s/p delivery of infant  Hospital course: Onset of Labor With Vaginal Delivery      35y.o. yo [id number] 343w6das admitted in Latent Labor on 08/10/2020 s/p PPROM at 361w4dabor course was complicated by hyperglycemia; she was subsequently started on Endotool shortly after admission. Her labor course was as follows:  Membrane Rupture Time/Date: 11:00 PM ,08/10/2020   Delivery Method:VBAC, Spontaneous  Episiotomy: None  Lacerations:  None  Patient had an uncomplicated postpartum course.  She is ambulating, tolerating a regular diet, passing flatus, and urinating well. Patient is discharged home in stable condition on 08/13/20.  Newborn Data: Birth date:08/12/2020  Birth time:12:59 AM  Gender:Female  Living status:Living  Apgars:3 ,4  Weight:3620 g   Magnesium Sulfate received: no BMZ received:  No Rhophylac:N/A MMR:N/A T-DaP:Given prenatally Flu: Yes Transfusion:No  Physical exam  Vitals:   08/12/20 0820 08/12/20 1220 08/12/20 2050 08/13/20 0520  BP: 131/70 138/76 129/89 134/90  Pulse: 90 78 89 90  Resp: _0 Temp: 98 F (36.7 C) 98 F (36.7 C) 98.4 F (36.9 C) 98.5 F (36.9 C)  TempSrc: Oral Oral Oral Oral  SpO2:   100% 100%  Weight:      Height:       General: alert, cooperative and no distress Lochia: appropriate Uterine Fundus: firm Incision: N/A DVT Evaluation: No evidence of DVT seen on physical exam. Negative Homan's sign. No cords or calf tenderness. No significant calf/ankle edema. Labs: Lab Results  Component Value Date   WBC 8.5 08/11/2020   HGB 11.8 (L) 08/11/2020   HCT 34.2 (L) 08/11/2020   MCV 87.2 08/11/2020   PLT 244 08/11/2020   CMP Latest Ref Rng & Units 08/11/2020  Glucose 70 - 99 mg/dL 190(H)  BUN 6 - 20 mg/dL 9  Creatinine 0.44 - 1.00 mg/dL 0.47  Sodium 135 - 145 mmol/L 134(L)  Potassium 3.5 - 5.1 mmol/L 4.3  Chloride 98 - 111 mmol/L 104  CO2 22 - 32 mmol/L 22  Calcium 8.9 - 10.3 mg/dL 8.4(L)  Total Protein 6.5 - 8.1 g/dL 5.6(L)  Total Bilirubin 0.3 - 1.2 mg/dL 0.5  Alkaline Phos 38 - 126 U/L 119  AST 15 - 41 U/L 25  ALT 0 - 44 U/L 18   Edinburgh Score: [place]  Postnatal Depression Scale Screening Tool 08/13/2020  I have been able to laugh and see the funny side of things. 0  I have looked forward with enjoyment to things. 0  I have blamed myself unnecessarily when things went wrong. 2  I have been anxious or worried for no good reason. 0  I have felt scared or panicky for no good reason. 1  Things have been getting on top of me. 1  I have been so unhappy that I have had difficulty sleeping. 1  I have felt sad or miserable. 1  I have been so unhappy that I have been crying. 1  The thought of harming myself has occurred to me. 0  Edinburgh Postnatal Depression Scale Total 7     After visit meds:  Allergies as  of 08/13/2020   No Known Allergies     Medication List    STOP taking these medications   Accu-Chek Guide test strip Generic drug: glucose blood   Accu-Chek Guide w/Device Kit   Accu-Chek Softclix Lancets lancets   aspirin EC 81 MG tablet   pantoprazole 40 MG tablet Commonly known as: Protonix     TAKE these medications   acetaminophen 325 MG tablet Commonly known as: Tylenol Take 2 tablets (650 mg total) by mouth every 6 (six) hours. What changed:   medication strength  how much to take  when to take this  reasons to take this   Buprenorphine HCl-Naloxone HCl 8-2 MG Film Commonly known as: Suboxone Place 1 Film under the tongue 3 (three) times daily.   coconut oil Oil Apply 1 application topically as needed.   FLUoxetine 20 MG capsule Commonly known as: PROzac Take 1 capsule (20 mg total) by mouth daily.   ibuprofen 600 MG tablet Commonly known as: ADVIL Take 1 tablet (600 mg total) by mouth every 6 (six) hours.   metFORMIN 500 MG 24 hr tablet Commonly known as: GLUCOPHAGE-XR Take 1 tablet (500 mg total) by mouth 2 (two) times daily with a meal.   NIFEdipine 30 MG 24 hr tablet Commonly known as: PROCARDIA-XL/NIFEDICAL-XL Take 1 tablet (30 mg total) by mouth daily.   PrePLUS 27-1 MG Tabs Take 1 tablet by mouth daily.      Discharge home in stable condition Infant Feeding: Breast Infant Disposition:NICU Discharge instruction: per After Visit Summary and Postpartum booklet. Activity: Advance as tolerated. Pelvic rest for 6 weeks.  Diet: routine diet Future Appointments: Follow up at Suboxone clinic in 1 week- list provided to pt Future Appointments  Date Time Provider [place]  08/16/2020  2:30 PM [place] [facility]  08/26/2020 10:15 AM [place] [facility]  09/23/2020  8:20 AM [facility] [place]  09/23/2020  8:55 AM [place] Mode, MD [facility]   Follow up Visit:  [place]  for [facility]. Go in 1 week(s).   Specialty: Obstetrics and Gynecology Contact information: [place] [address] [phone]             Message sent to [facility] on 08/12/20 to schedule PP appt.  Please schedule this patient for a In person postpartum visit in 6 weeks with the following provider: MD. Additional Postpartum F/U:Postpartum Depression checkup 2 weeks, 2 hour GTT and BP check 2-3 days  High risk pregnancy complicated by: PPROM <[id number]>_6<\/[id number]>_0 , h/o Cesarean x1, h/o shoulder dystocia, T2DM (no meds), gHTN, Tobacco use, Opioid Use Disorder (on  suboxone), Depression (prozac) Delivery mode:  VBAC, Spontaneous  Anticipated Birth Control:  Unsure  08/13/2020 Julianne Handler, CNM

## 2020-08-12 NOTE — Lactation Note (Signed)
This note was copied from a baby's chart. Lactation Consultation Note  Patient Name: Krystal [name] Date: 08/12/2020 Reason for consult: Initial assessment;NICU baby;Late-preterm 34-36.6wks  Initial visit at 12 hours of life w/this P4 Mom who is also COVID+ and takes suboxone. This LPI was taken to the NICU earlier for hypoglycemia. Prior to entering room, I called into room to ask Mom if it was an OK time for a visit. She agreed.  Shanda Bumps, RN had already gotten Mom pumping & Mom had pumped the equivalent of an entire colostrum vial about 11 mL. Mom had used size 27 flanges for previous pumping session, but based on her nipple diameter, size 24 flanges are more appropriate.  I showed Mom the size 24 flanges & provided her with the Medela sanitizing spray. I explained the CDC recommendation to sanitize pump parts after every use (after having been washed). Written instructions provided.  Mom says she already knows how to do hand expression. Extra colostrum vials and milk storage (bottles) containers provided.   Mom is active with [facility]. She does not have a pump at home. I faxed a form to the office.    Lurline Hare [facility] 08/12/2020, 2:47 PM

## 2020-08-12 NOTE — Anesthesia Postprocedure Evaluation (Signed)
Anesthesia Post Note  Patient: KEHAULANI FRUIN  Procedure(s) Performed: AN AD HOC LABOR EPIDURAL     Patient location during evaluation: Mother Baby Anesthesia Type: Epidural Level of consciousness: awake and alert Pain management: pain level controlled Vital Signs Assessment: post-procedure vital signs reviewed and stable Respiratory status: spontaneous breathing, nonlabored ventilation and respiratory function stable Cardiovascular status: stable Postop Assessment: no headache, no backache, epidural receding, no apparent nausea or vomiting, patient able to bend at knees, adequate PO intake and able to ambulate Anesthetic complications: no   No complications documented.  Last Vitals:  Vitals:   08/12/20 0405 08/12/20 0820  BP: 131/85 131/70  Pulse: (!) 114 90  Resp: 18 18  Temp: 37.2 C 36.7 C  SpO2: 98%     Last Pain:  Vitals:   08/12/20 0820  TempSrc: Oral  PainSc: 0-No pain   Pain Goal:                   [place]

## 2020-08-13 ENCOUNTER — Inpatient Hospital Stay (HOSPITAL_COMMUNITY): Payer: Medicaid Other

## 2020-08-13 ENCOUNTER — Inpatient Hospital Stay (HOSPITAL_COMMUNITY)
Admission: AD | Admit: 2020-08-13 | Payer: Medicaid Other | Source: Home / Self Care | Admitting: Obstetrics and Gynecology

## 2020-08-13 LAB — CULTURE, BETA STREP (GROUP B ONLY)

## 2020-08-13 MED ORDER — INFLUENZA VAC SPLIT QUAD 0.5 ML IM SUSY
0.5000 mL | PREFILLED_SYRINGE | INTRAMUSCULAR | Status: DC
  Filled 2020-08-13: qty 0.5

## 2020-08-13 MED ORDER — INFLUENZA VAC SPLIT QUAD 0.5 ML IM SUSY: 0.5000 mL | PREFILLED_SYRINGE | INTRAMUSCULAR | Status: DC

## 2020-08-13 MED ORDER — PANTOPRAZOLE SODIUM 40 MG PO TBEC
40.0000 mg | DELAYED_RELEASE_TABLET | Freq: Every day | ORAL | Status: DC
  Administered 2020-08-13: 40 mg via ORAL
  Filled 2020-08-13: qty 1

## 2020-08-13 MED ORDER — PNEUMOCOCCAL VAC POLYVALENT 25 MCG/0.5ML IJ INJ
0.5000 mL | INJECTION | INTRAMUSCULAR | Status: DC
  Filled 2020-08-13: qty 0.5

## 2020-08-13 MED ORDER — PNEUMOCOCCAL VAC POLYVALENT 25 MCG/0.5ML IJ INJ: 0.5000 mL | INJECTION | INTRAMUSCULAR | Status: DC

## 2020-08-13 MED FILL — NIFEdipine ER 30 MG TB24: 30 | 30 days supply | Qty: 30 | Fill #0

## 2020-08-13 MED FILL — ACETAMINOPHEN 325 MG TABS: 325 | 5 days supply | Qty: 30 | Fill #0

## 2020-08-13 MED FILL — IBUPROFEN 600 MG TABLET: 600 | 8 days supply | Qty: 30 | Fill #0

## 2020-08-13 MED FILL — METFORMIN HCL ER 500 MG TB2: 500 | 50 days supply | Qty: 100 | Fill #0

## 2020-08-13 NOTE — Lactation Note (Signed)
This note was copied from a baby's chart. Lactation Consultation Note  Patient Name: Girl Jowanna Loeffler [id number] Date: 08/13/2020   Age:33 hours  Confirmation was rec'd from Carley Hammed at the [facility] office that they had received the fax breastfeeding referral form for this Mom & baby.    Lurline Hare [facility] 08/13/2020, 10:41 AM

## 2020-08-13 NOTE — Progress Notes (Signed)
Post Partum Day 1 Subjective:  Patient is doing well without complaints. Ambulating without difficulty. Voiding and passing flatus. Tolerating PO. Abdominal pain improved. Vaginal bleeding decreased. Denies cp, sob, vision changes, headaches. Denies fever/chills. No cough, congestion.   Objective: Blood pressure 129/89, pulse 89, temperature 98.4 F (36.9 C), temperature source Oral, resp. rate 16, height 5\' 4"  (1.626 m), weight 113.4 kg, last menstrual period 09/11/2019, SpO2 100 %, unknown if currently breastfeeding.  Physical Exam:  General: alert, cooperative and no distress Lochia: appropriate Uterine Fundus: firm Incision: n/a DVT Evaluation: No evidence of DVT seen on physical exam.  Recent Labs    08/11/20 0102  HGB 11.8*  HCT 34.2*    Assessment/Plan: PPD#1 VBAC  -Doing well, meeting pp milestones  -BP mildly elevated, otherwise VSS  -baby in NICU  -will decide contraception at postpartum appt, counseled  #OUD  -suboxone 8mg  TID  -UDS neg  #Anxiety/depression  -mood stable  -continue home prozac 20mg  daily  -postpartum mood check  -SW consult ordered  #gHTN, diagnosed on admit  -asymptomatic  -procardia 30mg  xl started yesterday  #type 2 DM  -a1c 6.6 on admit  -metformin 500 mg BID  Patient desires discharge tomorrow given baby in NICU.     LOS: 2 days   10/09/20 08/13/2020, 5:29 AM

## 2020-08-13 NOTE — Clinical Social Work Maternal (Signed)
CLINICAL SOCIAL WORK MATERNAL/CHILD NOTE  Patient Details  Name: Krystal [name] MRN: [id number] Date of Birth: 09-06-1985  Date:  08/13/2020  Clinical Social Worker Initiating Note:  Krystal [name] Date/Time: Initiated:  08/13/20/1019     Child's Name:  Krystal [name]   Biological Parents:  Mother (MOB declined to provide any information about FOB.)   Need for Interpreter:  None   Reason for Referral:  Behavioral Health Concerns,Current Substance Use/Substance Use During Pregnancy     Address:  [address] [place] [address] [place] [address]    Phone number:  [phone] (home)     Additional phone number:   Household Members/Support Persons (HM/SP):   Household Member/Support Person 1,Household Member/Support Person 2,Household Member/Support Person 3   HM/SP Name Relationship DOB or Age  HM/SP -1 Krystal [name] son 07/11/2010  HM/SP -2 Krystal [name] son 08/18/2013  HM/SP -3 Krystal [name] son 12/15/2017  HM/SP -4        HM/SP -5        HM/SP -6        HM/SP -7        HM/SP -8          Natural Supports (not living in the home):  Extended Insurance account manager Supports: Therapist   Employment: Unemployed   Type of Work:     Education:  Engineer, agricultural   Homebound arranged:    Surveyor, quantity Resources:  [employer]   Other Resources:  WIC,Food Stamps     Cultural/Religious Considerations Which May Impact Care:  Per [employer] Occupational psychologist, MOB is Catholic  Strengths:  Ability to meet basic needs  ,Designer, fashion/clothing with medical plan  ,Home prepared for child  ,Psychotropic Medications   Psychotropic Medications:  Prozac      Pediatrician:    Armed forces operational officer area  Pediatrician List:   [facility]  [employer]    [facility]      Pediatrician Fax Number:    Risk Factors/Current Problems:  Substance Use  ,Mental Health Concerns      Cognitive State:  Alert  ,Insightful  ,Linear Thinking  ,Goal Oriented     Mood/Affect:  Calm  ,Interested  ,Comfortable  ,Relaxed     CSW Assessment: CSW called and completed clinical assessment for MH hx and current Subutex via telephone due to MOB's COVID-19 status. CSW explained CSW's role and when CSW called, CSW assessed for privacy and MOB informed CSW that MOB's mother Krystal [name]) was present. MOB gave CSW permission to complete clinical assessment via telephone while MOB's mother was present. MOB sound polite, was easy to engage, and was receptive to meeting with CSW.   CSW asked about MOB's currently Subutex treatment and MOB reported her current medication is managed by her OB. Per MOB, she takes her medications as prescribed and it is managing her symptoms. MOB communicated that she plans to transfer her treatment after the postpartum period. CSW provided education regarding NAS and MOB was understanding and reported, "The baby's doctor came down last night and talked to me about it." MOB denied having any questions or concerns and reported feeling well informed by the medical team.  CSW asked about MOB's MH hx and MOB was openly shared she has a dx of depression and is currently taking Prozac and meets routinely with [facility] Therapist Krystal [name]).  MOB is unsure when she was initially dx  with depression however reported it has been over 2 years ago. CSW provided education regarding the baby blues period vs. perinatal mood disorders, discussed treatment and gave resources for mental health follow up if concerns arise.  CSW recommends self-evaluation during the postpartum time period using the New Mom Checklist from Postpartum Progress and encouraged MOB to contact a medical professional if symptoms are noted at any time.  MOB denied PMAD symptoms with her older 3 children and presented with insight and awareness.  MOB did not display any acute MH signs or symtoms. CSW assessed for safety  and MOB denied SI, HI, and DV.  CSW provided review of Sudden Infant Death Syndrome (SIDS) precautions.    MOB requested that NP or Neo call and update MOB after rounds; CSW agreed to let them know (CSW updated NP).   MOB reported having a good support team and all essential items to care for infant.   CSW Plan/Description:  Psychosocial Support and Ongoing Assessment of Needs,Sudden Infant Death Syndrome (SIDS) Education,Perinatal Mood and Anxiety Disorder (PMADs) Education,Neonatal Abstinence Syndrome (NAS) Education,Other Patient/Family Education,Hospital Drug Screen Policy Information,Other Information/Referral to [employer] Will Continue to Monitor Umbilical Cord Tissue Drug Screen Results and Make Report if Warranted   CSW will continue to offer resources and supports to family while infant remains in NICU.    Krystal [name], MSW, LCSW Clinical Social Work [phone]  Krystal Cower, LCSW 08/13/2020, 10:24 AM

## 2020-08-13 NOTE — Lactation Note (Signed)
This note was copied from a baby's chart. Lactation Consultation Note  Patient Name: Krystal [name] [id number] Date: 08/13/2020 Reason for consult: Follow-up assessment;NICU baby Age:33 hours  Follow up visit to P4 mother of 60 hours old LPTI currently in NICU. Mother states she is going home. Mother is unable to pick up DEBP at [facility] office at this time and needs a loaner pump. Mother states she will picking up a loaner pump from [facility] as soon as she is contacted.  Pump #  [id number] $30 deposit provided in cash as required.  Product to be returned by 08/25/2020 .   Provided guidelines to care for pump parts. Talked about breast massage, hand expression and drops easily expressed. Mother is proficient. Mother expresses disappointment because only collecting drops at this point. Explained normal volume of colostrum according to gestation.   Encouraged to contact Lactation Services for any needs, support or questions. Praised mother for milk supply, effort and dedication.     Maternal Data Formula Feeding for Exclusion: Yes Reason for exclusion: Admission to Intensive Care Unit (ICU) post-partum Has patient been taught Hand Expression?: Yes Does the patient have breastfeeding experience prior to this delivery?: Yes  Feeding Feeding Type: Formula  Interventions Interventions: Breast feeding basics reviewed;Hand express;Breast massage;DEBP;Expressed milk  Lactation Tools Discussed/Used Tools: Pump;Flanges;Coconut oil Flange Size: 21;24 Breast pump type: Double-Electric Breast Pump WIC Program: Yes Pump Education: Setup, frequency, and cleaning;Milk Storage   Consult Status Consult Status: Follow-up Date: 08/14/20 Follow-up type: In-patient    Kashonda Sarkisyan A Higuera Ancidey 08/13/2020, 4:14 PM

## 2020-08-16 ENCOUNTER — Ambulatory Visit: Payer: Self-pay

## 2020-08-16 LAB — SURGICAL PATHOLOGY

## 2020-08-16 NOTE — BH Specialist Note (Signed)
Integrated Behavioral Health via Telemedicine Visit  08/16/2020 CARY WILFORD [id number]  Number of Integrated Behavioral Health visits: 2  Session Start time: 10:15  Session End time: 10:37 Total time: 22  Referring Provider: Garrison Bing, MD Patient/Family location: Home [facility] Provider location: Center for [facility]  All persons participating in visit: [name] and [facility] Lecretia Buczek   Types of Service: Individual psychotherapy  I connected with Krystal [name] and/or Krystal [name]'s n/a by Telephone  (Video is Caregility application) and verified that I am speaking with the correct person using two identifiers.Discussed confidentiality: Yes   I discussed the limitations of telemedicine and the availability of in person appointments.  Discussed there is a possibility of technology failure and discussed alternative modes of communication if that failure occurs.  I discussed that engaging in this telemedicine visit, they consent to the provision of behavioral healthcare and the services will be billed under their insurance.  Patient and/or legal guardian expressed understanding and consented to Telemedicine visit: Yes   Presenting Concerns: Patient and/or family reports the following symptoms/concerns: Pt states her primary concerns today is finding options for continued Suboxone maintenance and worry over possible high blood pressure postpartum with swelling in her legs until yesterday and mild swelling in her feet today; pt experiencing stress over no transportation, food insecurity.  Duration of problem: Postpartum; Severity of problem: moderate  Patient and/or Family's Strengths/Protective Factors: Social connections and Sense of purpose  Goals Addressed: Patient will: 1.  Reduce symptoms of: anxiety and stress  2.  Increase knowledge and/or ability of: stress reduction  3.  Demonstrate ability to:  Increase healthy adjustment to current life circumstances  Progress towards Goals: Ongoing  Interventions: Interventions utilized:  Solution-Focused Strategies Standardized Assessments completed: Not Needed  Patient and/or Family Response: Pt agrees to treatment plan  Assessment: Patient currently experiencing Psychosocial stress and . Monitoring for opioid maintenance therapy  Patient may benefit from psychoeducation and brief therapeutic interventions regarding coping with symptoms of current life stress .  Plan: 1. Follow up with behavioral health clinician on : Two weeks 2. Behavioral recommendations:  -Continue taking Suboxone as prescribed -Consider establishing with outpatient treatment of choice (options provided on After Visit Summary) as soon as possible for continued Suboxone maintenance; begin making calls today  -Accept referral to [employer] -Accept phone call today to set up BP check appointment at Center for [employer] [facility] [employer] for [facility] -Consider additional transportation resources (on AVS) 3. Referral(s): Integrated Art gallery manager (In Clinic) and [employer]:  Engineer, water  I discussed the assessment and treatment plan with the patient and/or parent/guardian. They were provided an opportunity to ask questions and all were answered. They agreed with the plan and demonstrated an understanding of the instructions.   They were advised to call back or seek an in-person evaluation if the symptoms worsen or if the condition fails to improve as anticipated.  Rae Lips, LCSW   Depression screen [facility] 2/9 07/27/2020 06/24/2020 06/24/2020 05/28/2020 05/20/2020  Decreased Interest 0 0 0 0 1  Down, Depressed, Hopeless 0 0 0 1 -  PHQ - 2 Score 0 0 0 1 1  Altered sleeping 0 1 1 1 2   Tired, decreased energy 1 3 1 1 1   Change in appetite 0 0 0 0 0  Feeling bad or failure about yourself  0 0 0 0 0  Trouble concentrating 0  0 0  1 0  Moving slowly or fidgety/restless 0 0 0 0 0  Suicidal thoughts 0 0 0 0 0  PHQ-9 Score 1 4 2 4 4   Some recent data might be hidden   GAD 7 : Generalized Anxiety Score 07/27/2020 06/24/2020 06/24/2020 05/28/2020  Nervous, Anxious, on Edge 0 0 0 0  Control/stop worrying 0 0 0 1  Worry too much - different things 0 1 0 1  Trouble relaxing 1 0 0 1  Restless 0 0 0 0  Easily annoyed or irritable 1 1 0 1  Afraid - awful might happen 0 0 0 0  Total GAD 7 Score 2 2 0 4

## 2020-08-17 ENCOUNTER — Ambulatory Visit: Payer: Self-pay

## 2020-08-17 NOTE — Lactation Note (Signed)
This note was copied from a baby's chart. Lactation Consultation Note  Patient Name: Krystal [name] [id number] Date: 08/17/2020   Age:33 days LC met with mother during d/c of infant. Mother continues to pump 8xday with yield of about 50cc's each time. Patient was provided with the opportunity to ask questions. All concerns were addressed.  Mother may f/u with outpatient LC prn.     Gwynne Edinger, MA IBCLC 08/17/2020, 2:09 PM

## 2020-08-18 ENCOUNTER — Other Ambulatory Visit: Payer: Self-pay | Admitting: Obstetrics and Gynecology

## 2020-08-24 ENCOUNTER — Other Ambulatory Visit: Payer: Self-pay | Admitting: Obstetrics and Gynecology

## 2020-08-24 ENCOUNTER — Other Ambulatory Visit: Payer: Self-pay

## 2020-08-24 DIAGNOSIS — F1120 Opioid dependence, uncomplicated: Secondary | ICD-10-CM

## 2020-08-24 DIAGNOSIS — O99320 Drug use complicating pregnancy, unspecified trimester: Secondary | ICD-10-CM

## 2020-08-24 MED ORDER — BUPRENORPHINE HCL-NALOXONE HCL 8-2 MG SL FILM: 8.0000 mg | ORAL_FILM | Freq: Three times a day (TID) | SUBLINGUAL | 0 refills | Status: DC

## 2020-08-24 NOTE — Telephone Encounter (Signed)
Called patient. Pt states she has called [facility] yesterday and they were not open. Has not called any other offices. Constant, MD agrees to send 14 day supply of Suboxone but no further medication. Pt to follow up with clinics she has seen before ASAP. Pt verbalized understanding that she will not receive any additional Suboxone from our office.

## 2020-08-26 ENCOUNTER — Ambulatory Visit (INDEPENDENT_AMBULATORY_CARE_PROVIDER_SITE_OTHER): Payer: Medicaid Other | Admitting: Clinical

## 2020-08-26 DIAGNOSIS — Z658 Other specified problems related to psychosocial circumstances: Secondary | ICD-10-CM | POA: Diagnosis not present

## 2020-08-26 DIAGNOSIS — Z5181 Encounter for therapeutic drug level monitoring: Secondary | ICD-10-CM

## 2020-08-26 NOTE — Patient Instructions (Addendum)
Center for [facility] [place] [facility] at [facility] for Women [address] [place], [place] [address] [phone] (main office) [phone] (Jamier Urbas's office)  EMERGENCY: 911  OPIOID DETOX (and treatment)  See below for opioid-specific detox and treatment programs, and note that area hospitals and emergency departments have LIMITED opioid-detox availability, often only for medical complications. For pregnant patients, only [facility] and [facility].  [facility]-Call [phone] for initial phone assessment, accept most insurance; Medicaid for treatment only(not detox, but will refer Medicaid patients out for detox), ShowDirectories.co.za for information. [address], [place], [place] [address]  [facility] - (Ask your medical provider for a referral, they will not accept self-referrals, they do have program for pregnant patients), located in [place], [place]  [facility] [place]- Call [phone] accepts both Medicaid and the uninsured www.freedomhouserecovery.org   [facility] - For detox, come in to emergency department at [address], [place], [place] [address]. You will need to be medically cleared first. Please bring all of your medications with you. They do accept pregnant patients for opioid detox.   [facility]- Call [phone] for more information about detox program and initial phone assessment, they accept Medicaid and uninsured only, no pregnancy detox ------------------------------------------------------  MEDICAID patients: Call [facility] at [phone] or www.[facility]hillscenter.[address] , [address], [place], [place] [address] to find providers who accept Medicaid   Other insurance: Freight forwarder company (number often on back of insurance card) to find out about providers in your area who take your insurance ------------------------------------------------------  HARM REDUCTION and PREVENTION:   Naloxone- See [facility]  below, to obtain Naloxone (for overdose reversals)   [employer] AIDS training and Education Center([employer], [place])  BestTheory.uy is the state-wide HIV/STI/hepatitis C training center for health professionals and advocates in the state of [place]. Professionals interested in training on diseases related to the opioid epidemic should contact the training center.   [facility] [place] (501)670-6570 or tbennett@insightnc .org or [email or web address] .com  [address] [place], [place] [address] Prevention, NOT treatment *Check [facility] for [facility] [place] or Project Lazarus of [place] on facebook  Mother-to-Baby [place]: Medications and More during Pregnancy and Breastfeeding- Questions about medications and substances during pregnancy and while breastfeeding, call ([phone] or text questions to ([phone] https://mothertobaby.org  METHADONE, SUBOXONE  [facility] [employer] ([facility]) [place] (641)614-0492 or www.adsyes.com [address] [place], [place] [address] * Methadone Program: call ([phone], extension 237, Talk to [facility] for an appointment * Non-opioid walk-in on Mondays, Tuesdays, Fridays, 1:30-3:00pm, or call to make an appointment  [facility] [phone]  [address], [place], [place] * Methadone, $14 per day/ Suboxone, $125 per week self-pay (do take [facility]) * First visit walk-in between 5:30am-8:30am, Monday through Friday, with picture ID and Medicaid card(if you have one)  [facility] (365)038-2640 Www.eleanorhealth.com  [employer], Medicaid, Medicare, some uninsured  [employer] [phone]  [address] [place], [place] [address]  *Medicaid accepted  [facility] [phone] to set up appointment [phone] or www.newseason.com ([phone])[phone]) [address], [place], [place] [address] * Methadone, $14 per day/ Buprenorphine, $19 per day/ Suboxone, $24 per day *Bring photo ID, must have opiates in system and at least one year of usage prior to admittance to program  [facility] ([phone] (Ask to speak to Jaci Carrel) or www.triadbehavioralresources.com  [address]  [address], [place], [place] [address] *Accepts most insurance and Medicaid *Locations also in [place], [place], [place] *Some Suboxone treatment  [facility] [phone]  [address], [place], [place] [address] *Call ahead for Suboxone appointment  TREATMENT FACILITIES (No methadone or suboxone) [facility] (727) 149-7345 or www.caringservices.org [address] [place], [place] [address] *Naloxone Distribution  *Outpatient and Transitional Housing  [facility] [phone] [address] [place], [place], [place] [address] *Must detox first (see detox locations above) *Requirements: Be uninsured OR have Medicaid, be a [employer] resident, and stay a minimum 30 days for inpatient treatment  [facility]  (726)400-6816 Www.eleanorhealth.com  [facility] [phone] or www.[facility]shiphall.com  [address] [place], [place] [address] *Detox, residential, partial hospitalization, intensive outpatient(day/evening), extended treatment, transitional housing, sober living *Call for [employer], no [employer] 959 706 5054 or www.legacyfreedom.com [address], [place], [place] [address] *Call [phone] after hours, or for information about locations in [place], [place], [place], and [place], [place] *No Medicaid or [employer], limited scholarships available  * NOT a 12-step program  [employer]  A supportive community for homeless women in recovery from substance abuse (336) (214) 883-9192 or email at maryshousegso@aol .com for more  information www.maryshousegso.org   [facility] 843-320-5649 or www.ringercenter.com  [address] [place], [place], [place], [address] Sarita Bottom, MD and Delphia Grates, NP *detox up to 3 months, most insurance and Medicaid  [facility] [place] (518) 576-9214 or www.roominn.org  *Comprehensive program helping homeless, single, pregnant women (with or without previous children), during pregnancy and after birth  *Must be a [place] resident, at least [redacted] weeks pregnant, with no severe psychiatric history   INDIVIDUAL TREATMENT PROVIDERS Dr. Iver Nestle Book, [address], [place], [place] [address], [phone] Dr. Dennard Schaumann, [address], [place], [place] [address] [phone] Dr. Vickey Huger [place], [address] [place], [place], [place] [address] [phone] Dr Lawerance Bach Stevan Born, [address], [place], [place] [address], [phone] Dr. Roger Shelter A. Jannifer [place] [address], [place], [place] [address] [phone] Dr. Marcy Salvo, [address], [place], [place] [address]  [phone] Dr. Fredna Dow. Wynonia Lawman [address] [place], [place] [address], [phone]  Dr. Wendi Maya, [address], [place], [place] [address], [phone], MSW,LCSW,LCAS, [address], [place], [place] [address] [phone], Private pay only                                                         SUPPORT GROUPS  Overdose Loss Support Group (667)574-0587 or www.hospicegso.org *Contact [place]ly [place](872-220-2149 or kgrove@hospicegso .org) or Mary [place]on((820)015-3040 or measton@hospicegso .org) for date, time, location, and more information about support group  [facility] 9300809943 or FraudPod.com.pt [address] [place], [place] [address] *Peer support for students and collegiate recovery community, NO TREATMENT, SUPPORT ONLY FOR STUDENTS AND FUTURE  STUDENTS  **All information above is for informational purposes only, and is subject to change, please contact agencies and/or providers to find out any updated information

## 2020-08-30 NOTE — BH Specialist Note (Signed)
Integrated Behavioral Health via Telemedicine Visit  08/30/2020 Krystal [name] [id number]  Number of Integrated Behavioral Health visits: 4 (2 out of past six visits less than 15 minutes) Session Start time: 10:23  Session End time: 10:37 Total time: 14  Referring Provider: [place] Bing, MD Patient/Family location: Home [facility] Provider location: [facility]  All persons participating in visit: [name] and [name]   Types of Service: Telephone visit  I connected with [name] [name] by Telephone  (Video is Caregility application) and verified that I am speaking with the correct person using two identifiers.Discussed confidentiality: Yes   I discussed the limitations of telemedicine and the availability of in person appointments.  Discussed there is a possibility of technology failure and discussed alternative modes of communication if that failure occurs.  I discussed that engaging in this telemedicine visit, they consent to the provision of behavioral healthcare and the services will be billed under their insurance.  Patient and/or legal guardian expressed understanding and consented to Telemedicine visit: Yes   Presenting Concerns: Patient and/or family reports the following symptoms/concerns: Pt states her primary concern today is adjusting to current stress (financial); open to learn of additional community resources and ongoing BH medication management.  Duration of problem: Ongoing; Severity of problem: moderate  Patient and/or Family's Strengths/Protective Factors: Social connections and Sense of purpose  Goals Addressed: Patient will: 1.  Reduce symptoms of: stress  2.  Increase knowledge and/or ability of: stress reduction  3.  Demonstrate ability to: Increase healthy adjustment to current life circumstances and Increase adequate support systems for  patient/family  Progress towards Goals: Ongoing  Interventions: Interventions utilized:  Solution-Focused Strategies Standardized Assessments completed: Not Needed  Patient and/or Family Response: Pt agrees to treatment plan  Assessment: Patient currently experiencing Psychosocial stress.   Patient may benefit from continued psychoeducation and brief therapeutic interventions regarding coping with current stress .  Plan: 1. Follow up with behavioral health clinician on : Call Tyree Vandruff at [phone] as needed 2. Behavioral recommendations:  -Continue taking Suboxone and Prozac as prescribed -Continue with plan to attend  [facility] initial appointment on 09/21/20 for continued management of Suboxone -Talk to PCP about continued Prozac management -Consider  LIEAP and LIEWAP applications on [email or web address] website for utility support -Continue with plan to contact EBT worker to add baby to EBT  3. Referral(s): Integrated Art gallery manager (In Clinic) and [employer]:  Utilities  I discussed the assessment and treatment plan with the patient and/or parent/guardian. They were provided an opportunity to ask questions and all were answered. They agreed with the plan and demonstrated an understanding of the instructions.   They were advised to call back or seek an in-person evaluation if the symptoms worsen or if the condition fails to improve as anticipated.  Rae Lips, LCSW   Depression screen [facility] 2/9 07/27/2020 06/24/2020 06/24/2020 05/28/2020 05/20/2020  Decreased Interest 0 0 0 0 1  Down, Depressed, Hopeless 0 0 0 1 -  PHQ - 2 Score 0 0 0 1 1  Altered sleeping 0 1 1 1 2   Tired, decreased energy 1 3 1 1 1   Change in appetite 0 0 0 0 0  Feeling bad or failure about yourself  0 0 0 0 0  Trouble concentrating 0 0 0 1 0  Moving slowly or fidgety/restless 0 0 0 0 0  Suicidal thoughts 0  0 0 0 0  PHQ-9 Score 1 4 2 4 4   Some recent data might  be hidden   GAD 7 : Generalized Anxiety Score 07/27/2020 06/24/2020 06/24/2020 05/28/2020  Nervous, Anxious, on Edge 0 0 0 0  Control/stop worrying 0 0 0 1  Worry too much - different things 0 1 0 1  Trouble relaxing 1 0 0 1  Restless 0 0 0 0  Easily annoyed or irritable 1 1 0 1  Afraid - awful might happen 0 0 0 0  Total GAD 7 Score 2 2 0 4     .

## 2020-08-31 ENCOUNTER — Telehealth (INDEPENDENT_AMBULATORY_CARE_PROVIDER_SITE_OTHER): Payer: Medicaid Other

## 2020-08-31 DIAGNOSIS — Z0130 Encounter for examination of blood pressure without abnormal findings: Secondary | ICD-10-CM

## 2020-08-31 NOTE — Progress Notes (Signed)
Called pt and left message that I am calling in regards to her virtual visit.  If she could please call the office and let them know you have an appt.  I will call back in 15 minutes in which if I do not reach you I will have to request that you reschedule.     Called pt in 15 minutes and pt informed me that she did not know how she was going to do a BP check at home when she does not have a BP cuff.  Pt reports that is taking the Procardia 30 mg tablet.  No concerns at this time.  I requested if she could please reschedule her appt for an in person visit.  Pt stated that she will be able to come in on Thursday 09/02/20 @ 130 pm.  Front office notified to place her on the schedule.    Addison Naegeli, RN  08/31/20

## 2020-09-02 ENCOUNTER — Ambulatory Visit: Payer: Medicaid Other

## 2020-09-07 ENCOUNTER — Other Ambulatory Visit: Payer: Self-pay

## 2020-09-07 DIAGNOSIS — F1120 Opioid dependence, uncomplicated: Secondary | ICD-10-CM

## 2020-09-07 DIAGNOSIS — O99320 Drug use complicating pregnancy, unspecified trimester: Secondary | ICD-10-CM

## 2020-09-08 ENCOUNTER — Other Ambulatory Visit: Payer: Self-pay | Admitting: Obstetrics and Gynecology

## 2020-09-08 ENCOUNTER — Other Ambulatory Visit: Payer: Self-pay

## 2020-09-08 ENCOUNTER — Telehealth: Payer: Self-pay | Admitting: Clinical

## 2020-09-08 DIAGNOSIS — F1120 Opioid dependence, uncomplicated: Secondary | ICD-10-CM

## 2020-09-08 MED ORDER — BUPRENORPHINE HCL-NALOXONE HCL 8-2 MG SL FILM: 8.0000 mg | ORAL_FILM | Freq: Three times a day (TID) | SUBLINGUAL | 0 refills | Status: DC

## 2020-09-08 NOTE — Telephone Encounter (Signed)
Pt requesting one more refill of Suboxone, until her initial scheduled visit at [facility] treatment on 09/21/20; pt took last Suboxone yesterday, and is scared to get sick without medication.   Message forward to medical provider; pt aware that [facility] or medical provider will call her back with any information regarding refill.

## 2020-09-09 ENCOUNTER — Ambulatory Visit (INDEPENDENT_AMBULATORY_CARE_PROVIDER_SITE_OTHER): Payer: Medicaid Other | Admitting: Clinical

## 2020-09-09 DIAGNOSIS — Z658 Other specified problems related to psychosocial circumstances: Secondary | ICD-10-CM | POA: Diagnosis not present

## 2020-09-09 NOTE — Patient Instructions (Signed)
[facility] [address] [place], [place] [address] [phone] (main office) [phone] (Cedricka Sackrider's office)   

## 2020-09-10 ENCOUNTER — Other Ambulatory Visit: Payer: Self-pay | Admitting: Family Medicine

## 2020-09-10 DIAGNOSIS — O99320 Drug use complicating pregnancy, unspecified trimester: Secondary | ICD-10-CM

## 2020-09-10 DIAGNOSIS — F1120 Opioid dependence, uncomplicated: Secondary | ICD-10-CM

## 2020-09-23 ENCOUNTER — Other Ambulatory Visit: Payer: Medicaid Other

## 2020-09-23 ENCOUNTER — Encounter: Payer: Self-pay | Admitting: Obstetrics & Gynecology

## 2020-09-23 ENCOUNTER — Ambulatory Visit: Payer: Self-pay | Admitting: Obstetrics & Gynecology

## 2020-11-10 DIAGNOSIS — F329 Major depressive disorder, single episode, unspecified: Secondary | ICD-10-CM | POA: Diagnosis not present

## 2021-03-07 DIAGNOSIS — F331 Major depressive disorder, recurrent, moderate: Secondary | ICD-10-CM | POA: Diagnosis not present

## 2021-03-08 DIAGNOSIS — F331 Major depressive disorder, recurrent, moderate: Secondary | ICD-10-CM | POA: Diagnosis not present

## 2021-03-29 DIAGNOSIS — F329 Major depressive disorder, single episode, unspecified: Secondary | ICD-10-CM | POA: Diagnosis not present

## 2021-03-31 ENCOUNTER — Encounter (HOSPITAL_COMMUNITY): Payer: Self-pay | Admitting: Emergency Medicine

## 2021-03-31 ENCOUNTER — Ambulatory Visit (HOSPITAL_COMMUNITY)
Admission: EM | Admit: 2021-03-31 | Discharge: 2021-03-31 | Disposition: A | Discharge status: Home / Self Care | Payer: Medicaid Other | Attending: Emergency Medicine | Admitting: Emergency Medicine

## 2021-03-31 ENCOUNTER — Other Ambulatory Visit: Payer: Self-pay

## 2021-03-31 DIAGNOSIS — L0211 Cutaneous abscess of neck: Secondary | ICD-10-CM | POA: Diagnosis not present

## 2021-03-31 MED ORDER — DOXYCYCLINE HYCLATE 100 MG PO CAPS: 100.0000 mg | ORAL_CAPSULE | Freq: Two times a day (BID) | ORAL | 0 refills | Status: DC

## 2021-03-31 MED ORDER — IBUPROFEN 800 MG PO TABS: 800.0000 mg | ORAL_TABLET | Freq: Three times a day (TID) | ORAL | 0 refills | Status: DC

## 2021-03-31 NOTE — Discharge Instructions (Addendum)
Take doxycyline twice a day for 7 days  Hold warm to hot compresses to area at this 4 times a day, the more the better  You may take ibuprofen three times a day with food to help with pain   If area becomes more painful, swollen, warm, you begin to have fever or chills you may return to urgent care for evaluation
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
## 2021-05-03 DIAGNOSIS — F411 Generalized anxiety disorder: Secondary | ICD-10-CM | POA: Diagnosis not present

## 2021-05-07 DIAGNOSIS — F319 Bipolar disorder, unspecified: Secondary | ICD-10-CM | POA: Diagnosis not present

## 2021-05-20 DIAGNOSIS — F411 Generalized anxiety disorder: Secondary | ICD-10-CM | POA: Diagnosis not present

## 2021-05-24 DIAGNOSIS — F319 Bipolar disorder, unspecified: Secondary | ICD-10-CM | POA: Diagnosis not present

## 2021-06-03 DIAGNOSIS — F329 Major depressive disorder, single episode, unspecified: Secondary | ICD-10-CM | POA: Diagnosis not present

## 2021-06-04 DIAGNOSIS — F3481 Disruptive mood dysregulation disorder: Secondary | ICD-10-CM | POA: Diagnosis not present

## 2021-06-14 DIAGNOSIS — F39 Unspecified mood [affective] disorder: Secondary | ICD-10-CM | POA: Diagnosis not present

## 2021-06-20 ENCOUNTER — Other Ambulatory Visit: Payer: Self-pay

## 2021-06-20 ENCOUNTER — Ambulatory Visit (INDEPENDENT_AMBULATORY_CARE_PROVIDER_SITE_OTHER): Payer: Medicaid Other | Admitting: General Practice

## 2021-06-20 DIAGNOSIS — Z3201 Encounter for pregnancy test, result positive: Secondary | ICD-10-CM

## 2021-06-20 LAB — POCT PREGNANCY, URINE: Preg Test, Ur: POSITIVE — AB

## 2021-06-20 NOTE — Progress Notes (Signed)
Patient came into office and dropped off urine sample for UPT. UPT +.   Called patient at home for results, no answer- left message to call us back concerning her appointment from earlier today.  Chase Caller RN BSN 06/20/21

## 2021-06-21 ENCOUNTER — Telehealth: Payer: Self-pay

## 2021-06-21 DIAGNOSIS — Z3201 Encounter for pregnancy test, result positive: Secondary | ICD-10-CM

## 2021-06-21 NOTE — Progress Notes (Signed)
.  cwhrn °

## 2021-06-21 NOTE — Telephone Encounter (Signed)
Patient left VM on nurse VM stating she is returning nurses phone call in regards to UPT visit.

## 2021-06-23 NOTE — Addendum Note (Signed)
Addended by: Ed Blalock on: 06/23/2021 11:23 AM   Modules accepted: Orders

## 2021-06-23 NOTE — Telephone Encounter (Addendum)
Returned patients call from UPT visit from [place].   Patient reports her last period was maybe in September but is not sure, her fiance feels it was in August.   She is planning to have [facility] in this office.   Scheduled dating [place] for 11/21 at 2 pm. Arrive MFM with full bladder. Patient informed and agreeable.   She is taking PNV. She has no other questions or concerns at this time.

## 2021-06-27 ENCOUNTER — Other Ambulatory Visit: Payer: Self-pay | Admitting: *Deleted

## 2021-06-27 ENCOUNTER — Ambulatory Visit
Admission: RE | Admit: 2021-06-27 | Discharge: 2021-06-27 | Disposition: A | Discharge status: Home / Self Care | Payer: Medicaid Other | Source: Ambulatory Visit | Attending: Obstetrics and Gynecology | Admitting: Obstetrics and Gynecology

## 2021-06-27 ENCOUNTER — Other Ambulatory Visit: Payer: Self-pay

## 2021-06-27 ENCOUNTER — Other Ambulatory Visit: Payer: Self-pay | Admitting: Obstetrics and Gynecology

## 2021-06-27 ENCOUNTER — Ambulatory Visit (HOSPITAL_BASED_OUTPATIENT_CLINIC_OR_DEPARTMENT_OTHER): Payer: Medicaid Other

## 2021-06-27 DIAGNOSIS — Z3A17 17 weeks gestation of pregnancy: Secondary | ICD-10-CM | POA: Diagnosis not present

## 2021-06-27 DIAGNOSIS — O99212 Obesity complicating pregnancy, second trimester: Secondary | ICD-10-CM | POA: Diagnosis not present

## 2021-06-27 DIAGNOSIS — Z3201 Encounter for pregnancy test, result positive: Secondary | ICD-10-CM | POA: Insufficient documentation

## 2021-06-27 DIAGNOSIS — O24112 Pre-existing diabetes mellitus, type 2, in pregnancy, second trimester: Secondary | ICD-10-CM

## 2021-06-27 DIAGNOSIS — Z3689 Encounter for other specified antenatal screening: Secondary | ICD-10-CM

## 2021-06-27 DIAGNOSIS — Z363 Encounter for antenatal screening for malformations: Secondary | ICD-10-CM

## 2021-06-27 DIAGNOSIS — E669 Obesity, unspecified: Secondary | ICD-10-CM

## 2021-06-28 ENCOUNTER — Telehealth: Payer: Self-pay | Admitting: Obstetrics and Gynecology

## 2021-06-28 NOTE — Telephone Encounter (Signed)
I called [name] today at 8:18 AM and confirmed patient's identity using two patient identifiers. [place] results from earlier today were reviewed. First trimester warning signs reviewed. Patient voiced understanding and had no further questions.   [place] MFM OB COMP + 14 WK  Result Date: 06/27/2021 ----------------------------------------------------------------------  OBSTETRICS REPORT                       (Signed Final 06/27/2021 05:01 pm) ---------------------------------------------------------------------- Patient Info  ID #:       [id number]                          D.O.B.:  [birth year] (35 yrs)  Name:       Krystal [name]              Visit Date: 06/27/2021 02:49 pm ---------------------------------------------------------------------- Performed By  Attending:        Noralee Space MD        Ref. Address:     [address]                                                             [place], [place]                                                             [address]  Performed By:     Magnus Ivan           [facility]  Referred By:      Hermina Staggers  MD ---------------------------------------------------------------------- Orders  #  Description                           Code        Ordered By  1  [place] MFM OB COMP + 14 WK                76805.01    MICHAEL ERVIN ----------------------------------------------------------------------  #  Order #                     Accession #                Episode #  1  [id number]                   [id number]                 [id number] ---------------------------------------------------------------------- Indications  Pre-existing diabetes, type 2, in  pregnancy,   O24.112  second trimester  [redacted] weeks gestation of pregnancy                Z3A.17  Obesity complicating pregnancy, second         O99.212  trimester  Encounter for uncertain dates                  Z36.87  Substance abuse affecting pregnancy,           O99.320 F19.10  antepartum (Subutex)  Poor obstetric history: Previous preterm       O09.219  delivery, antepartum ---------------------------------------------------------------------- Fetal Evaluation  Num Of Fetuses:         1  Fetal Heart Rate(bpm):  143  Cardiac Activity:       Observed  Presentation:           Transverse, head to maternal left  Placenta:               Anterior  P. Cord Insertion:      Visualized, central  Amniotic Fluid  AFI FV:      Within normal limits ---------------------------------------------------------------------- Biometry  BPD:      37.6  mm     G. Age:  17w 3d         52  %    CI:        69.97   %    70 - 86                                                          FL/HC:      16.2   %    14.6 - 17.6  HC:      143.4  mm     G. Age:  17w 4d         49  %    HC/AC:      1.22        1.07 - 1.29  AC:      117.7  mm     G. Age:  17w 4d         51  %    FL/BPD:     62.0   %  FL:       23.3  mm     G. Age:  17w  0d         29  %    FL/AC:      19.8   %    20 - 24  HUM:      28.2  mm     G. Age:  19w 1d       > 95  %  CER:     17.48  mm     G. Age:  17w 4d         52  %  NFT:      3.42  mm  LV:       6.13  mm  CM:       2.89  mm  Est. FW:     189  gm      0 lb 7 oz     36  % ---------------------------------------------------------------------- Gestational Age  LMP:           11w 4d        Date:  04/07/21                 EDD:   01/12/22  U/S Today:     17w 3d                                        EDD:   12/02/21  Best:          17w 3d     Det. By:  U/S (06/27/21)           EDD:   12/02/21 ---------------------------------------------------------------------- Anatomy  Cranium:               Appears normal         Aortic Arch:             Not well visualized  Cavum:                 Appears normal         Ductal Arch:            Not well visualized  Ventricles:            Seen                   Diaphragm:              Not well visualized  Choroid Plexus:        Seen                   Stomach:                Appears normal, left                                                                        sided  Cerebellum:            Appears normal         Abdomen:                Appears normal  Posterior Fossa:       Appears normal  Abdominal Wall:         Appears nml (cord                                                                        insert, abd wall)  Nuchal Fold:           Not well visualized    Cord Vessels:           Appears normal (3                                                                        vessel cord)  Face:                  Orbits appear          Kidneys:                Not well visualized                         normal  Lips:                  Appears normal         Bladder:                Appears normal  Heart:                 Not well visualized    Spine:                  Not well visualized  RVOT:                  Not well visualized    Upper Extremities:      Not well visualized  LVOT:                  Not well visualized    Lower Extremities:      Appears normal  Other:  Heels visualized ---------------------------------------------------------------------- Cervix Uterus Adnexa  Cervix  Length:           3.98  cm.  Closed LUS contractions prevented adequate assessment  Right Ovary  Not visualized. No adnexal mass visualized.  Left Ovary  Not visualized. No adnexal mass visualized. ---------------------------------------------------------------------- Impression  G5 P4.  Patient with uncertain dates is here for ultrasound  evaluation  Fetal biometry is consistent with 17 weeks and 3 days.  Amniotic fluid is normal and good fetal activity seen.  Fetal  anatomical survey was limited because of early gestational   age.  We have assigned her EDD 12/02/2021. ---------------------------------------------------------------------- Recommendations  Detailed fetal anatomical survey in 2 weeks. ----------------------------------------------------------------------                  Noralee Space, MD Electronically Signed Final Report   06/27/2021 05:01 pm ----------------------------------------------------------------------   Duane Lope, NP 06/28/2021 8:18 AM

## 2021-07-03 ENCOUNTER — Encounter (HOSPITAL_COMMUNITY): Payer: Self-pay

## 2021-07-03 ENCOUNTER — Ambulatory Visit (HOSPITAL_COMMUNITY)
Admission: EM | Admit: 2021-07-03 | Discharge: 2021-07-03 | Disposition: A | Discharge status: Home / Self Care | Payer: Medicaid Other | Attending: Emergency Medicine | Admitting: Emergency Medicine

## 2021-07-03 DIAGNOSIS — K029 Dental caries, unspecified: Secondary | ICD-10-CM

## 2021-07-03 DIAGNOSIS — K047 Periapical abscess without sinus: Secondary | ICD-10-CM | POA: Diagnosis not present

## 2021-07-03 MED ORDER — AMOXICILLIN 500 MG PO CAPS: 500.0000 mg | ORAL_CAPSULE | Freq: Three times a day (TID) | ORAL | 0 refills | Status: DC

## 2021-07-03 MED ORDER — CEFTRIAXONE SODIUM 1 G IJ SOLR
INTRAMUSCULAR | Status: AC
  Filled 2021-07-03: qty 10

## 2021-07-03 MED ORDER — IBUPROFEN 800 MG PO TABS: 800.0000 mg | ORAL_TABLET | Freq: Once | ORAL | Status: DC

## 2021-07-03 MED ORDER — IBUPROFEN 800 MG PO TABS: 800.0000 mg | ORAL_TABLET | Freq: Three times a day (TID) | ORAL | 0 refills | Status: DC

## 2021-07-03 MED ORDER — LIDOCAINE HCL (PF) 1 % IJ SOLN
INTRAMUSCULAR | Status: AC
  Filled 2021-07-03: qty 2

## 2021-07-03 MED ORDER — PENICILLIN V POTASSIUM 500 MG PO TABS: 500.0000 mg | ORAL_TABLET | Freq: Four times a day (QID) | ORAL | 0 refills | Status: DC

## 2021-07-03 MED ORDER — CEFTRIAXONE SODIUM 1 G IJ SOLR: 1.0000 g | Freq: Once | INTRAMUSCULAR | Status: DC

## 2021-07-03 NOTE — Discharge Instructions (Addendum)
For dental abscess, please begin amoxicillin, 1 tablet 3 times daily.  It is really important that you find a dentist to soon as possible to have that tooth addressed because it will only become infected again if it is not either filled or removed.  Bacteria that cause dental infections can easily escape into the bloodstream and get stuck in your heart which can be life-threatening.

## 2021-07-03 NOTE — ED Provider Notes (Addendum)
[facility]    CSN: [id number] Arrival date & time: 07/03/21  1102    HISTORY   Chief Complaint  Patient presents with   Dental Pain    Pt is [redacted] weeks pregnant   HPI Krystal [name] is a 35 y.o. female. Patient complains of a 2-day history of tooth pain and swelling along her left lower jawline.  Patient states she has not tried anything to alleviate her discomfort.  Patient states she does not have a dentist.  Patient's reports that she smokes cigarettes every day.  Patient is pregnant.  The history is provided by the patient.  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   Patient Active Problem List   Diagnosis Date Noted   VBAC (vaginal birth after Cesarean) 08/12/2020   Type 2 diabetes mellitus in pregnancy 08/11/2020   Supervision of high-risk pregnancy 08/11/2020   Gestational hypertension 08/11/2020   BMI 40.0-44.9, adult (HCC) 05/17/2020   Pregnancy complicated by subutex maintenance, antepartum (HCC) 05/17/2020   Late prenatal care 05/17/2020   Acute pain of right knee 10/13/2019   Type 2 diabetes mellitus (HCC) 12/03/2018   Moderate episode of recurrent major depressive disorder (HCC) 12/03/2018   Pre-existing type 2 diabetes mellitus in pregnancy 10/19/2017   Supervision of high risk pregnancy, antepartum 06/04/2017   H/O: substance abuse (HCC) 06/04/2017   Bilateral knee pain 10/08/2014   Acne 05/14/2014   Alopecia 05/12/2014   History of VBAC 08/18/2013   GERD (gastroesophageal reflux disease) 08/30/2012   HIDRADENITIS SUPPURATIVA 03/17/2009   ECZEMA, ATOPIC 08/26/2007   DEPRESSION, MILD 05/08/2007   Obesity in pregnancy 10/04/2006   TOBACCO DEPENDENCE 10/04/2006   MIGRAINE, UNSPEC., W/O INTRACTABLE MIGRAINE 10/04/2006   Past Surgical  History:  Procedure Laterality Date   ANTERIOR CRUCIATE LIGAMENT REPAIR Left 07/23/2015   Procedure: RECONSTRUCTION ANTERIOR CRUCIATE LIGAMENT (ACL) WITH GRAFTLINK HAMSTRING GRAFT;  Surgeon: Sheral Apley, MD;  Location: [facility];  Service: Orthopedics;  Laterality: Left;   CESAREAN SECTION N/A 08/18/2013   Procedure: CESAREAN SECTION;  Surgeon: Reva Bores, MD;  Location: [facility];  Service: Obstetrics;  Laterality: N/A;   EXCISION BONE CYST     gum   KNEE ARTHROSCOPY WITH MEDIAL MENISECTOMY Left 07/23/2015   Procedure: LEFT KNEE ARTHROSCOPY WITH PARTIAL MEDIAL MENISCECTOMY ;  Surgeon: Sheral Apley, MD;  Location: [facility];  Service: Orthopedics;  Laterality: Left;   KNEE ARTHROSCOPY WITH MENISCAL REPAIR Left 07/23/2015   Procedure: LATERAL MENISCAL REPAIR;  Surgeon: Sheral Apley, MD;  Location: [facility];  Service: Orthopedics;  Laterality: Left;   OB History     Gravida  5   Para  4   Term  3   Preterm  1   AB      Living  4      SAB      IAB      Ectopic      Multiple  0   Live Births  4          Home Medications    Prior to Admission medications   Medication Sig Start Date End Date Taking? Authorizing Provider  amoxicillin (AMOXIL) 500 MG capsule Take 1 capsule (  500 mg total) by mouth 3 (three) times daily for 14 days. 07/03/21 07/17/21 Yes Lynden Oxford Scales, PA-C  penicillin v potassium (VEETID) 500 MG tablet Take 1 tablet (500 mg total) by mouth 4 (four) times daily for 14 days. 07/03/21 07/17/21 Yes Lynden Oxford Scales, PA-C  acetaminophen (TYLENOL) 325 MG tablet TAKE 2 TABLETS (650 MG TOTAL) BY MOUTH EVERY 6 (SIX) HOURS. 08/12/20 08/12/21  Janet Berlin, MD  Buprenorphine HCl-Naloxone HCl (SUBOXONE) 8-2 MG FILM Place 1 Film under the tongue 3 (three) times daily. 09/08/20   Constant, Peggy, MD  coconut oil OIL Apply 1 application topically as needed. 08/12/20   Janet Berlin, MD  FLUoxetine  (PROZAC) 20 MG capsule Take 1 capsule (20 mg total) by mouth daily. 05/28/20   Constant, Peggy, MD  metFORMIN (GLUCOPHAGE-XR) 500 MG 24 hr tablet TAKE 1 TABLET (500 MG TOTAL) BY MOUTH 2 (TWO) TIMES DAILY WITH A MEAL. 08/12/20 08/12/21  Janet Berlin, MD  NIFEdipine (ADALAT CC) 30 MG 24 hr tablet TAKE 1 TABLET (30 MG TOTAL) BY MOUTH DAILY. 08/12/20 08/12/21  Janet Berlin, MD  NIFEdipine (PROCARDIA-XL/NIFEDICAL-XL) 30 MG 24 hr tablet Take 1 tablet (30 mg total) by mouth daily. 08/13/20   Janet Berlin, MD  Prenatal Vit-Fe Fumarate-FA (PREPLUS) 27-1 MG TABS Take 1 tablet by mouth daily. 05/17/20   Aletha Halim, MD   Family History Family History  Problem Relation Age of Onset   Asthma Mother    Diabetes Mother    Alcohol abuse Mother    Stroke Mother    Social History Social History   Tobacco Use   Smoking status: Every Day    Packs/day: 0.50    Years: 10.00    Pack years: 5.00    Types: Cigarettes   Smokeless tobacco: Never  Vaping Use   Vaping Use: Never used  Substance Use Topics   Alcohol use: No   Drug use: No    Comment: Hx percocet use, on subutex   Allergies   Patient has no known allergies.  Review of Systems Review of Systems Pertinent findings noted in history of present illness.   Physical Exam Triage Vital Signs ED Triage Vitals  Enc Vitals Group     BP 06/03/21 0827 (!) 147/82     Pulse Rate 06/03/21 0827 72     Resp 06/03/21 0827 18     Temp 06/03/21 0827 98.3 F (36.8 C)     Temp Source 06/03/21 0827 Oral     SpO2 06/03/21 0827 98 %     Weight --      Height --      Head Circumference --      Peak Flow --      Pain Score 06/03/21 0826 5     Pain Loc --      Pain Edu? --      Excl. in Page? --   No data found.  Updated Vital Signs Pulse 87   Temp 98.2 F (36.8 C)   Resp 16   SpO2 100%   Physical Exam Vitals and nursing note reviewed.  Constitutional:      General: She is not in acute distress.    Appearance: Normal appearance. She is  not ill-appearing.  HENT:     Head: Normocephalic and atraumatic.     Salivary Glands: Right salivary gland is not diffusely enlarged or tender. Left salivary gland is not diffusely enlarged or tender.     Right Ear:  Tympanic membrane, ear canal and external ear normal. No drainage. No middle ear effusion. There is no impacted cerumen. Tympanic membrane is not erythematous or bulging.     Left Ear: Tympanic membrane, ear canal and external ear normal. No drainage.  No middle ear effusion. There is no impacted cerumen. Tympanic membrane is not erythematous or bulging.     Nose: Nose normal. No nasal deformity, septal deviation, mucosal edema, congestion or rhinorrhea.     Right Turbinates: Not enlarged, swollen or pale.     Left Turbinates: Not enlarged, swollen or pale.     Right Sinus: No maxillary sinus tenderness or frontal sinus tenderness.     Left Sinus: No maxillary sinus tenderness or frontal sinus tenderness.     Mouth/Throat:     Lips: Pink. No lesions.     Mouth: Mucous membranes are moist. No oral lesions.     Dentition: Dental caries and dental abscesses (Left lower jaw) present.     Pharynx: Oropharynx is clear. Uvula midline. No posterior oropharyngeal erythema or uvula swelling.     Tonsils: No tonsillar exudate. 0 on the right. 0 on the left.  Eyes:     General: Lids are normal.        Right eye: No discharge.        Left eye: No discharge.     Extraocular Movements: Extraocular movements intact.     Conjunctiva/sclera: Conjunctivae normal.     Right eye: Right conjunctiva is not injected.     Left eye: Left conjunctiva is not injected.  Neck:     Trachea: Trachea and phonation normal.  Cardiovascular:     Rate and Rhythm: Normal rate and regular rhythm.     Pulses: Normal pulses.     Heart sounds: Normal heart sounds. No murmur heard.   No friction rub. No gallop.  Pulmonary:     Effort: Pulmonary effort is normal. No accessory muscle usage, prolonged expiration or  respiratory distress.     Breath sounds: Normal breath sounds. No stridor, decreased air movement or transmitted upper airway sounds. No decreased breath sounds, wheezing, rhonchi or rales.  Chest:     Chest wall: No tenderness.  Musculoskeletal:        General: Normal range of motion.     Cervical back: Normal range of motion and neck supple. Normal range of motion.  Lymphadenopathy:     Cervical: Cervical adenopathy present.     Left cervical: Superficial cervical adenopathy and deep cervical adenopathy present.  Skin:    General: Skin is warm and dry.     Findings: No erythema or rash.  Neurological:     General: No focal deficit present.     Mental Status: She is alert and oriented to person, place, and time.  Psychiatric:        Mood and Affect: Mood normal.        Behavior: Behavior normal.    Visual Acuity Right Eye Distance:   Left Eye Distance:   Bilateral Distance:    Right Eye Near:   Left Eye Near:    Bilateral Near:     UC Couse / Diagnostics / Procedures:    EKG  Radiology No results found.  Procedures Procedures (including critical care time)  UC Diagnoses / Final Clinical Impressions(s)   I have reviewed the triage vital signs and the nursing notes.  Pertinent labs & imaging results that were available during my care of the patient were reviewed by  me and considered in my medical decision making (see chart for details).    Final diagnoses:  Dental caries  Dental abscess   Patient advised to begin amoxicillin and to obtain a dentist soon as possible.  Disposition Upon Discharge:  Condition: stable for discharge home Home: take medications as prescribed; routine discharge instructions as discussed; follow up as advised.  Patient presented with an acute illness with associated systemic symptoms and significant discomfort requiring urgent management. In my opinion, this is a condition that a prudent lay person (someone who possesses an average  knowledge of health and medicine) may potentially expect to result in complications if not addressed urgently such as respiratory distress, impairment of bodily function or dysfunction of bodily organs.   Routine symptom specific, illness specific and/or disease specific instructions were discussed with the patient and/or caregiver at length.   As such, the patient has been evaluated and assessed, work-up was performed and treatment was provided in alignment with urgent care protocols and evidence based medicine.  Patient/parent/caregiver has been advised that the patient may require follow up for further testing and treatment if the symptoms continue in spite of treatment, as clinically indicated and appropriate.  If the patient was tested for COVID-19, Influenza and/or RSV, then the patient/parent/guardian was advised to isolate at home pending the results of his/her diagnostic coronavirus test and potentially longer if they're positive. I have also advised pt that if his/her COVID-19 test returns positive, it's recommended to self-isolate for at least 10 days after symptoms first appeared AND until fever-free for 24 hours without fever reducer AND other symptoms have improved or resolved. Discussed self-isolation recommendations as well as instructions for household member/close contacts as per the [facility] and [place] DHHS, and also gave patient the [place] packet with this information.  Patient/parent/caregiver has been advised to return to the [facility] or PCP in 3-5 days if no better; to PCP or the Emergency Department if new signs and symptoms develop, or if the current signs or symptoms continue to change or worsen for further workup, evaluation and treatment as clinically indicated and appropriate  The patient will follow up with their current PCP if and as advised. If the patient does not currently have a PCP we will assist them in obtaining one.   The patient may need specialty follow up if the symptoms  continue, in spite of conservative treatment and management, for further workup, evaluation, consultation and treatment as clinically indicated and appropriate.   Patient/parent/caregiver verbalized understanding and agreement of plan as discussed.  All questions were addressed during visit.  Please see discharge instructions below for further details of plan.  ED Prescriptions     Medication Sig Dispense Auth. Provider   penicillin v potassium (VEETID) 500 MG tablet Take 1 tablet (500 mg total) by mouth 4 (four) times daily for 14 days. 56 tablet Lynden Oxford Scales, PA-C   ibuprofen (ADVIL) 800 MG tablet  (Status: Discontinued) Take 1 tablet (800 mg total) by mouth 3 (three) times daily. 21 tablet Lynden Oxford Scales, PA-C   amoxicillin (AMOXIL) 500 MG capsule Take 1 capsule (500 mg total) by mouth 3 (three) times daily for 14 days. 42 capsule Lynden Oxford Scales, PA-C      PDMP not reviewed this encounter.  Pending results:  Labs Reviewed - No data to display  Medications Ordered in UC: Medications - No data to display   Discharge Instructions:   Discharge Instructions      For dental abscess, please begin  amoxicillin, 1 tablet 3 times daily.  It is really important that you find a dentist to soon as possible to have that tooth addressed because it will only become infected again if it is not either filled or removed.  Bacteria that cause dental infections can easily escape into the bloodstream and get stuck in your heart which can be life-threatening.         Lynden Oxford Scales, PA-C 07/03/21 1356    Lynden Oxford Scales, [place] 07/03/21 1407

## 2021-07-03 NOTE — ED Triage Notes (Signed)
Patient presents to the office for tooth pain, this started 2 days ago.

## 2021-07-07 DIAGNOSIS — Z419 Encounter for procedure for purposes other than remedying health state, unspecified: Secondary | ICD-10-CM | POA: Diagnosis not present

## 2021-07-15 ENCOUNTER — Encounter: Payer: Self-pay | Admitting: *Deleted

## 2021-07-15 ENCOUNTER — Ambulatory Visit: Payer: Medicaid Other | Attending: Obstetrics and Gynecology

## 2021-07-15 ENCOUNTER — Ambulatory Visit: Payer: Medicaid Other | Admitting: *Deleted

## 2021-07-15 ENCOUNTER — Other Ambulatory Visit: Payer: Self-pay

## 2021-07-15 ENCOUNTER — Other Ambulatory Visit: Payer: Self-pay | Admitting: *Deleted

## 2021-07-15 VITALS — BP 115/66 | HR 106

## 2021-07-15 DIAGNOSIS — O09892 Supervision of other high risk pregnancies, second trimester: Secondary | ICD-10-CM

## 2021-07-15 DIAGNOSIS — Z3689 Encounter for other specified antenatal screening: Secondary | ICD-10-CM | POA: Diagnosis not present

## 2021-07-15 DIAGNOSIS — Z363 Encounter for antenatal screening for malformations: Secondary | ICD-10-CM | POA: Diagnosis not present

## 2021-07-15 DIAGNOSIS — Z3A20 20 weeks gestation of pregnancy: Secondary | ICD-10-CM | POA: Diagnosis not present

## 2021-07-15 DIAGNOSIS — O09212 Supervision of pregnancy with history of pre-term labor, second trimester: Secondary | ICD-10-CM | POA: Diagnosis not present

## 2021-07-15 DIAGNOSIS — O99322 Drug use complicating pregnancy, second trimester: Secondary | ICD-10-CM

## 2021-07-15 DIAGNOSIS — O24112 Pre-existing diabetes mellitus, type 2, in pregnancy, second trimester: Secondary | ICD-10-CM

## 2021-07-15 DIAGNOSIS — O34219 Maternal care for unspecified type scar from previous cesarean delivery: Secondary | ICD-10-CM

## 2021-07-15 DIAGNOSIS — E669 Obesity, unspecified: Secondary | ICD-10-CM

## 2021-07-15 DIAGNOSIS — O99212 Obesity complicating pregnancy, second trimester: Secondary | ICD-10-CM

## 2021-07-15 DIAGNOSIS — E119 Type 2 diabetes mellitus without complications: Secondary | ICD-10-CM | POA: Diagnosis not present

## 2021-07-15 DIAGNOSIS — F1910 Other psychoactive substance abuse, uncomplicated: Secondary | ICD-10-CM

## 2021-07-26 ENCOUNTER — Encounter: Payer: Medicaid Other | Admitting: Student

## 2021-08-07 DIAGNOSIS — Z419 Encounter for procedure for purposes other than remedying health state, unspecified: Secondary | ICD-10-CM | POA: Diagnosis not present

## 2021-08-16 ENCOUNTER — Ambulatory Visit: Payer: Medicaid Other

## 2021-08-16 ENCOUNTER — Ambulatory Visit: Payer: Medicaid Other | Attending: Obstetrics and Gynecology

## 2021-09-07 DIAGNOSIS — Z419 Encounter for procedure for purposes other than remedying health state, unspecified: Secondary | ICD-10-CM | POA: Diagnosis not present

## 2021-10-05 DIAGNOSIS — Z419 Encounter for procedure for purposes other than remedying health state, unspecified: Secondary | ICD-10-CM | POA: Diagnosis not present

## 2021-10-17 ENCOUNTER — Ambulatory Visit: Payer: Medicaid Other | Attending: Obstetrics and Gynecology

## 2021-10-17 ENCOUNTER — Ambulatory Visit: Payer: Medicaid Other | Admitting: *Deleted

## 2021-10-17 ENCOUNTER — Other Ambulatory Visit: Payer: Self-pay

## 2021-10-17 ENCOUNTER — Other Ambulatory Visit: Payer: Self-pay | Admitting: Obstetrics and Gynecology

## 2021-10-17 ENCOUNTER — Encounter: Payer: Self-pay | Admitting: *Deleted

## 2021-10-17 VITALS — BP 123/77 | HR 107

## 2021-10-17 DIAGNOSIS — O34219 Maternal care for unspecified type scar from previous cesarean delivery: Secondary | ICD-10-CM | POA: Diagnosis not present

## 2021-10-17 DIAGNOSIS — O99323 Drug use complicating pregnancy, third trimester: Secondary | ICD-10-CM | POA: Diagnosis not present

## 2021-10-17 DIAGNOSIS — O09892 Supervision of other high risk pregnancies, second trimester: Secondary | ICD-10-CM

## 2021-10-17 DIAGNOSIS — F1910 Other psychoactive substance abuse, uncomplicated: Secondary | ICD-10-CM

## 2021-10-17 DIAGNOSIS — O99212 Obesity complicating pregnancy, second trimester: Secondary | ICD-10-CM

## 2021-10-17 DIAGNOSIS — O24112 Pre-existing diabetes mellitus, type 2, in pregnancy, second trimester: Secondary | ICD-10-CM

## 2021-10-17 DIAGNOSIS — O09213 Supervision of pregnancy with history of pre-term labor, third trimester: Secondary | ICD-10-CM

## 2021-10-17 DIAGNOSIS — E669 Obesity, unspecified: Secondary | ICD-10-CM | POA: Diagnosis not present

## 2021-10-17 DIAGNOSIS — F1120 Opioid dependence, uncomplicated: Secondary | ICD-10-CM | POA: Diagnosis not present

## 2021-10-17 DIAGNOSIS — O99213 Obesity complicating pregnancy, third trimester: Secondary | ICD-10-CM

## 2021-10-17 DIAGNOSIS — O99320 Drug use complicating pregnancy, unspecified trimester: Secondary | ICD-10-CM | POA: Diagnosis not present

## 2021-10-17 DIAGNOSIS — O99333 Smoking (tobacco) complicating pregnancy, third trimester: Secondary | ICD-10-CM | POA: Diagnosis not present

## 2021-10-17 DIAGNOSIS — O0933 Supervision of pregnancy with insufficient antenatal care, third trimester: Secondary | ICD-10-CM | POA: Diagnosis not present

## 2021-10-17 DIAGNOSIS — Z3A33 33 weeks gestation of pregnancy: Secondary | ICD-10-CM

## 2021-10-17 DIAGNOSIS — O24113 Pre-existing diabetes mellitus, type 2, in pregnancy, third trimester: Secondary | ICD-10-CM | POA: Diagnosis not present

## 2021-10-17 DIAGNOSIS — F17200 Nicotine dependence, unspecified, uncomplicated: Secondary | ICD-10-CM

## 2021-10-18 ENCOUNTER — Other Ambulatory Visit: Payer: Self-pay | Admitting: *Deleted

## 2021-10-18 DIAGNOSIS — O24113 Pre-existing diabetes mellitus, type 2, in pregnancy, third trimester: Secondary | ICD-10-CM

## 2021-10-18 DIAGNOSIS — Z6838 Body mass index (BMI) 38.0-38.9, adult: Secondary | ICD-10-CM

## 2021-10-18 DIAGNOSIS — F1120 Opioid dependence, uncomplicated: Secondary | ICD-10-CM

## 2021-10-19 ENCOUNTER — Other Ambulatory Visit: Payer: Self-pay | Admitting: *Deleted

## 2021-10-19 ENCOUNTER — Other Ambulatory Visit (HOSPITAL_COMMUNITY)
Admission: RE | Admit: 2021-10-19 | Discharge: 2021-10-19 | Disposition: A | Discharge status: Home / Self Care | Payer: Medicaid Other | Source: Ambulatory Visit | Attending: Student | Admitting: Student

## 2021-10-19 ENCOUNTER — Other Ambulatory Visit: Payer: Self-pay

## 2021-10-19 ENCOUNTER — Encounter: Payer: Self-pay | Admitting: Family Medicine

## 2021-10-19 ENCOUNTER — Ambulatory Visit (INDEPENDENT_AMBULATORY_CARE_PROVIDER_SITE_OTHER): Payer: Medicaid Other | Admitting: Family Medicine

## 2021-10-19 VITALS — BP 129/92 | HR 121 | Wt 259.0 lb

## 2021-10-19 DIAGNOSIS — R030 Elevated blood-pressure reading, without diagnosis of hypertension: Secondary | ICD-10-CM | POA: Diagnosis not present

## 2021-10-19 DIAGNOSIS — O24113 Pre-existing diabetes mellitus, type 2, in pregnancy, third trimester: Secondary | ICD-10-CM | POA: Diagnosis not present

## 2021-10-19 DIAGNOSIS — Z3A33 33 weeks gestation of pregnancy: Secondary | ICD-10-CM

## 2021-10-19 DIAGNOSIS — B3731 Acute candidiasis of vulva and vagina: Secondary | ICD-10-CM

## 2021-10-19 DIAGNOSIS — O99210 Obesity complicating pregnancy, unspecified trimester: Secondary | ICD-10-CM

## 2021-10-19 DIAGNOSIS — O0930 Supervision of pregnancy with insufficient antenatal care, unspecified trimester: Secondary | ICD-10-CM | POA: Diagnosis not present

## 2021-10-19 DIAGNOSIS — F331 Major depressive disorder, recurrent, moderate: Secondary | ICD-10-CM | POA: Diagnosis not present

## 2021-10-19 DIAGNOSIS — Z23 Encounter for immunization: Secondary | ICD-10-CM | POA: Diagnosis not present

## 2021-10-19 DIAGNOSIS — F1911 Other psychoactive substance abuse, in remission: Secondary | ICD-10-CM | POA: Diagnosis not present

## 2021-10-19 DIAGNOSIS — Z98891 History of uterine scar from previous surgery: Secondary | ICD-10-CM | POA: Diagnosis not present

## 2021-10-19 DIAGNOSIS — O99213 Obesity complicating pregnancy, third trimester: Secondary | ICD-10-CM

## 2021-10-19 DIAGNOSIS — Z8759 Personal history of other complications of pregnancy, childbirth and the puerperium: Secondary | ICD-10-CM | POA: Diagnosis not present

## 2021-10-19 DIAGNOSIS — O0990 Supervision of high risk pregnancy, unspecified, unspecified trimester: Secondary | ICD-10-CM

## 2021-10-19 LAB — GLUCOSE, CAPILLARY: Glucose-Capillary: 172 mg/dL — ABNORMAL HIGH (ref 70–99)

## 2021-10-19 MED ORDER — GLUCOSE BLOOD VI STRP: ORAL_STRIP | 12 refills | Status: DC

## 2021-10-19 MED ORDER — FLUCONAZOLE 150 MG PO TABS: 150.0000 mg | ORAL_TABLET | Freq: Once | ORAL | 0 refills | Status: AC

## 2021-10-19 MED ORDER — ACCU-CHEK GUIDE W/DEVICE KIT: 1.0000 | PACK | Freq: Four times a day (QID) | 0 refills | Status: DC

## 2021-10-19 MED ORDER — ACCU-CHEK SOFTCLIX LANCETS MISC: 12 refills | Status: DC

## 2021-10-19 MED ORDER — METFORMIN HCL 1000 MG PO TABS: 1000.0000 mg | ORAL_TABLET | Freq: Two times a day (BID) | ORAL | 5 refills | Status: DC

## 2021-10-19 NOTE — Patient Instructions (Signed)
Contraception Choices ?Contraception, also called birth control, refers to methods or devices that prevent pregnancy. ?Hormonal methods ?Contraceptive implant ?A contraceptive implant is a thin, plastic tube that contains a hormone that prevents pregnancy. It is different from an intrauterine device (IUD). It is inserted into the upper part of the arm by a health care provider. Implants can be effective for up to 3 years. ?Progestin-only injections ?Progestin-only injections are injections of progestin, a synthetic form of the hormone progesterone. They are given every 3 months by a health care provider. ?Birth control pills ?Birth control pills are pills that contain hormones that prevent pregnancy. They must be taken once a day, preferably at the same time each day. A prescription is needed to use this method of contraception. ?Birth control patch ?The birth control patch contains hormones that prevent pregnancy. It is placed on the skin and must be changed once a week for three weeks and removed on the fourth week. A prescription is needed to use this method of contraception. ?Vaginal ring ?A vaginal ring contains hormones that prevent pregnancy. It is placed in the vagina for three weeks and removed on the fourth week. After that, the process is repeated with a new ring. A prescription is needed to use this method of contraception. ?Emergency contraceptive ?Emergency contraceptives prevent pregnancy after unprotected sex. They come in pill form and can be taken up to 5 days after sex. They work best the sooner they are taken after having sex. Most emergency contraceptives are available without a prescription. This method should not be used as your only form of birth control. ?Barrier methods ?Female condom ?A female condom is a thin sheath that is worn over the penis during sex. Condoms keep sperm from going inside a woman's body. They can be used with a sperm-killing substance (spermicide) to increase their  effectiveness. They should be thrown away after one use. ?Female condom ?A female condom is a soft, loose-fitting sheath that is put into the vagina before sex. The condom keeps sperm from going inside a woman's body. They should be thrown away after one use. ?Diaphragm ?A diaphragm is a soft, dome-shaped barrier. It is inserted into the vagina before sex, along with a spermicide. The diaphragm blocks sperm from entering the uterus, and the spermicide kills sperm. A diaphragm should be left in the vagina for 6-8 hours after sex and removed within 24 hours. ?A diaphragm is prescribed and fitted by a health care provider. A diaphragm should be replaced every 1-2 years, after giving birth, after gaining more than 15 lb (6.8 kg), and after pelvic surgery. ?Cervical cap ?A cervical cap is a round, soft latex or plastic cup that fits over the cervix. It is inserted into the vagina before sex, along with spermicide. It blocks sperm from entering the uterus. The cap should be left in place for 6-8 hours after sex and removed within 48 hours. A cervical cap must be prescribed and fitted by a health care provider. It should be replaced every 2 years. ?Sponge ?A sponge is a soft, circular piece of polyurethane foam with spermicide in it. The sponge helps block sperm from entering the uterus, and the spermicide kills sperm. To use it, you make it wet and then insert it into the vagina. It should be inserted before sex, left in for at least 6 hours after sex, and removed and thrown away within 30 hours. ?Spermicides ?Spermicides are chemicals that kill or block sperm from entering the cervix and uterus.  They can come as a cream, jelly, suppository, foam, or tablet. A spermicide should be inserted into the vagina with an applicator at least [id number] minutes before sex to allow time for it to work. The process must be repeated every time you have sex. Spermicides do not require a prescription. ?Intrauterine  contraception ?Intrauterine device (IUD) ?An IUD is a T-shaped device that is put in a woman's uterus. There are two types: ?Hormone IUD.This type contains progestin, a synthetic form of the hormone progesterone. This type can stay in place for 3-5 years. ?Copper IUD.This type is wrapped in copper wire. It can stay in place for 10 years. ?Permanent methods of contraception ?Female tubal ligation ?In this method, a woman's fallopian tubes are sealed, tied, or blocked during surgery to prevent eggs from traveling to the uterus. ?Hysteroscopic sterilization ?In this method, a small, flexible insert is placed into each fallopian tube. The inserts cause scar tissue to form in the fallopian tubes and block them, so sperm cannot reach an egg. The procedure takes about 3 months to be effective. Another form of birth control must be used during those 3 months. ?Female sterilization ?This is a procedure to tie off the tubes that carry sperm (vasectomy). After the procedure, the man can still ejaculate fluid (semen). Another form of birth control must be used for 3 months after the procedure. ?Natural planning methods ?Natural family planning ?In this method, a couple does not have sex on days when the woman could become pregnant. ?Calendar method ?In this method, the woman keeps track of the length of each menstrual cycle, identifies the days when pregnancy can happen, and does not have sex on those days. ?Ovulation method ?In this method, a couple avoids sex during ovulation. ?Symptothermal method ?This method involves not having sex during ovulation. The woman typically checks for ovulation by watching changes in her temperature and in the consistency of cervical mucus. ?Post-ovulation method ?In this method, a couple waits to have sex until after ovulation. ?Where to find more information ?Centers for Disease Control and Prevention: http://www.wolf.info/ ?Summary ?Contraception, also called birth control, refers to methods or devices  that prevent pregnancy. ?Hormonal methods of contraception include implants, injections, pills, patches, vaginal rings, and emergency contraceptives. ?Barrier methods of contraception can include female condoms, female condoms, diaphragms, cervical caps, sponges, and spermicides. ?There are two types of IUDs (intrauterine devices). An IUD can be put in a woman's uterus to prevent pregnancy for 3-5 years. ?Permanent sterilization can be done through a procedure for males and females. Natural family planning methods involve nothaving sex on days when the woman could become pregnant. ?This information is not intended to replace advice given to you by your health care provider. Make sure you discuss any questions you have with your health care provider. ?Document Revised: 12/29/2019 Document Reviewed: 12/29/2019 ?Elsevier Patient Education ? [place]. ? ?Preparing for Vaginal Birth After Cesarean Delivery ?Vaginal birth after cesarean delivery (VBAC) means giving birth vaginally after previously delivering a baby through a cesarean section (C-section). You and your health care provider will discuss your options and whether you may be a good candidate for VBAC. ?Types of options ?After a cesarean birth, your options for future deliveries may include: ?A scheduled cesarean birth. This is done in a hospital with an operating room. ?A trial of labor after cesarean. A successful trial of labor results in a vaginal delivery. If it is not successful, you will need to have a cesarean birth. A trial of  labor after a cesarean should be attempted in facilities where an emergency cesarean birth can be performed. ?What are the benefits? ?The benefits of delivering your baby vaginally instead of by a cesarean birth include: ?A shorter hospital stay. ?A faster recovery time. ?Less pain. ?Avoiding risks associated with major surgery, such as infection and blood clots. ?Less blood loss and less need for donated blood  (transfusions). ?What are the risks? ?The main risk of attempting a VBAC is that it may fail. This would force your health care provider to deliver your baby by a C-section. ?Other risks are rare and may include: ?Tearing (rupture) of the scar f
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
## 2021-10-20 ENCOUNTER — Emergency Department (HOSPITAL_COMMUNITY): Payer: Medicaid Other

## 2021-10-20 ENCOUNTER — Emergency Department (HOSPITAL_COMMUNITY)
Admission: EM | Admit: 2021-10-20 | Discharge: 2021-10-20 | Disposition: A | Discharge status: Other Acute Inpatient Hospital | Payer: Medicaid Other | Attending: Emergency Medicine | Admitting: Emergency Medicine

## 2021-10-20 ENCOUNTER — Encounter (HOSPITAL_COMMUNITY): Payer: Self-pay | Admitting: Radiology

## 2021-10-20 DIAGNOSIS — T2029XA Burn of second degree of multiple sites of head, face, and neck, initial encounter: Secondary | ICD-10-CM | POA: Insufficient documentation

## 2021-10-20 DIAGNOSIS — J069 Acute upper respiratory infection, unspecified: Secondary | ICD-10-CM | POA: Diagnosis not present

## 2021-10-20 DIAGNOSIS — T23032A Burn of unspecified degree of multiple left fingers (nail), not including thumb, initial encounter: Secondary | ICD-10-CM

## 2021-10-20 DIAGNOSIS — T23002A Burn of unspecified degree of left hand, unspecified site, initial encounter: Secondary | ICD-10-CM | POA: Diagnosis not present

## 2021-10-20 DIAGNOSIS — T3120 Burns involving 20-29% of body surface with 0% to 9% third degree burns: Secondary | ICD-10-CM | POA: Diagnosis not present

## 2021-10-20 DIAGNOSIS — Z9911 Dependence on respirator [ventilator] status: Secondary | ICD-10-CM | POA: Diagnosis not present

## 2021-10-20 DIAGNOSIS — T3122 Burns involving 20-29% of body surface with 20-29% third degree burns: Secondary | ICD-10-CM | POA: Diagnosis not present

## 2021-10-20 DIAGNOSIS — Z20822 Contact with and (suspected) exposure to covid-19: Secondary | ICD-10-CM | POA: Insufficient documentation

## 2021-10-20 DIAGNOSIS — R6510 Systemic inflammatory response syndrome (SIRS) of non-infectious origin without acute organ dysfunction: Secondary | ICD-10-CM | POA: Diagnosis not present

## 2021-10-20 DIAGNOSIS — T2020XA Burn of second degree of head, face, and neck, unspecified site, initial encounter: Secondary | ICD-10-CM | POA: Diagnosis not present

## 2021-10-20 DIAGNOSIS — S3993XA Unspecified injury of pelvis, initial encounter: Secondary | ICD-10-CM | POA: Diagnosis not present

## 2021-10-20 DIAGNOSIS — R0689 Other abnormalities of breathing: Secondary | ICD-10-CM | POA: Diagnosis not present

## 2021-10-20 DIAGNOSIS — T310 Burns involving less than 10% of body surface: Secondary | ICD-10-CM | POA: Diagnosis not present

## 2021-10-20 DIAGNOSIS — T2134XA Burn of third degree of lower back, initial encounter: Secondary | ICD-10-CM | POA: Diagnosis not present

## 2021-10-20 DIAGNOSIS — T799XXA Unspecified early complication of trauma, initial encounter: Secondary | ICD-10-CM | POA: Diagnosis not present

## 2021-10-20 DIAGNOSIS — O43893 Other placental disorders, third trimester: Secondary | ICD-10-CM | POA: Diagnosis not present

## 2021-10-20 DIAGNOSIS — T2132XA Burn of third degree of abdominal wall, initial encounter: Secondary | ICD-10-CM | POA: Diagnosis not present

## 2021-10-20 DIAGNOSIS — T23201A Burn of second degree of right hand, unspecified site, initial encounter: Secondary | ICD-10-CM | POA: Diagnosis not present

## 2021-10-20 DIAGNOSIS — Z743 Need for continuous supervision: Secondary | ICD-10-CM | POA: Diagnosis not present

## 2021-10-20 DIAGNOSIS — R000 Tachycardia, unspecified: Secondary | ICD-10-CM | POA: Diagnosis not present

## 2021-10-20 DIAGNOSIS — J9600 Acute respiratory failure, unspecified whether with hypoxia or hypercapnia: Secondary | ICD-10-CM | POA: Diagnosis not present

## 2021-10-20 DIAGNOSIS — T2030XA Burn of third degree of head, face, and neck, unspecified site, initial encounter: Secondary | ICD-10-CM | POA: Diagnosis not present

## 2021-10-20 DIAGNOSIS — T24202A Burn of second degree of unspecified site of left lower limb, except ankle and foot, initial encounter: Secondary | ICD-10-CM | POA: Diagnosis not present

## 2021-10-20 DIAGNOSIS — T2220XA Burn of second degree of shoulder and upper limb, except wrist and hand, unspecified site, initial encounter: Secondary | ICD-10-CM | POA: Diagnosis not present

## 2021-10-20 DIAGNOSIS — J9601 Acute respiratory failure with hypoxia: Secondary | ICD-10-CM | POA: Diagnosis not present

## 2021-10-20 DIAGNOSIS — Z3A33 33 weeks gestation of pregnancy: Secondary | ICD-10-CM

## 2021-10-20 DIAGNOSIS — T25292A Burn of second degree of multiple sites of left ankle and foot, initial encounter: Secondary | ICD-10-CM | POA: Diagnosis not present

## 2021-10-20 DIAGNOSIS — T300 Burn of unspecified body region, unspecified degree: Secondary | ICD-10-CM | POA: Diagnosis not present

## 2021-10-20 DIAGNOSIS — T1490XA Injury, unspecified, initial encounter: Secondary | ICD-10-CM

## 2021-10-20 DIAGNOSIS — S0990XA Unspecified injury of head, initial encounter: Secondary | ICD-10-CM | POA: Diagnosis not present

## 2021-10-20 DIAGNOSIS — R519 Headache, unspecified: Secondary | ICD-10-CM | POA: Diagnosis not present

## 2021-10-20 DIAGNOSIS — X000XXA Exposure to flames in uncontrolled fire in building or structure, initial encounter: Secondary | ICD-10-CM | POA: Insufficient documentation

## 2021-10-20 DIAGNOSIS — Z4682 Encounter for fitting and adjustment of non-vascular catheter: Secondary | ICD-10-CM | POA: Diagnosis not present

## 2021-10-20 DIAGNOSIS — S199XXA Unspecified injury of neck, initial encounter: Secondary | ICD-10-CM | POA: Diagnosis not present

## 2021-10-20 DIAGNOSIS — E43 Unspecified severe protein-calorie malnutrition: Secondary | ICD-10-CM | POA: Diagnosis not present

## 2021-10-20 DIAGNOSIS — S299XXA Unspecified injury of thorax, initial encounter: Secondary | ICD-10-CM | POA: Diagnosis not present

## 2021-10-20 DIAGNOSIS — T23302A Burn of third degree of left hand, unspecified site, initial encounter: Secondary | ICD-10-CM | POA: Diagnosis not present

## 2021-10-20 DIAGNOSIS — X088XXA Exposure to other specified smoke, fire and flames, initial encounter: Secondary | ICD-10-CM

## 2021-10-20 DIAGNOSIS — O34211 Maternal care for low transverse scar from previous cesarean delivery: Secondary | ICD-10-CM | POA: Diagnosis not present

## 2021-10-20 DIAGNOSIS — R109 Unspecified abdominal pain: Secondary | ICD-10-CM | POA: Insufficient documentation

## 2021-10-20 DIAGNOSIS — Z8759 Personal history of other complications of pregnancy, childbirth and the puerperium: Secondary | ICD-10-CM | POA: Diagnosis not present

## 2021-10-20 DIAGNOSIS — T23202A Burn of second degree of left hand, unspecified site, initial encounter: Secondary | ICD-10-CM | POA: Diagnosis not present

## 2021-10-20 DIAGNOSIS — Z4659 Encounter for fitting and adjustment of other gastrointestinal appliance and device: Secondary | ICD-10-CM | POA: Diagnosis not present

## 2021-10-20 DIAGNOSIS — Z781 Physical restraint status: Secondary | ICD-10-CM | POA: Diagnosis not present

## 2021-10-20 DIAGNOSIS — O9A213 Injury, poisoning and certain other consequences of external causes complicating pregnancy, third trimester: Secondary | ICD-10-CM | POA: Insufficient documentation

## 2021-10-20 DIAGNOSIS — T23261A Burn of second degree of back of right hand, initial encounter: Secondary | ICD-10-CM | POA: Insufficient documentation

## 2021-10-20 DIAGNOSIS — T25291A Burn of second degree of multiple sites of right ankle and foot, initial encounter: Secondary | ICD-10-CM | POA: Diagnosis not present

## 2021-10-20 DIAGNOSIS — O1092 Unspecified pre-existing hypertension complicating childbirth: Secondary | ICD-10-CM | POA: Diagnosis not present

## 2021-10-20 DIAGNOSIS — O2412 Pre-existing diabetes mellitus, type 2, in childbirth: Secondary | ICD-10-CM | POA: Diagnosis not present

## 2021-10-20 DIAGNOSIS — T23301A Burn of third degree of right hand, unspecified site, initial encounter: Secondary | ICD-10-CM | POA: Diagnosis not present

## 2021-10-20 DIAGNOSIS — S3991XA Unspecified injury of abdomen, initial encounter: Secondary | ICD-10-CM | POA: Diagnosis not present

## 2021-10-20 DIAGNOSIS — O99214 Obesity complicating childbirth: Secondary | ICD-10-CM | POA: Diagnosis not present

## 2021-10-20 DIAGNOSIS — O99324 Drug use complicating childbirth: Secondary | ICD-10-CM | POA: Diagnosis not present

## 2021-10-20 DIAGNOSIS — T24201A Burn of second degree of unspecified site of right lower limb, except ankle and foot, initial encounter: Secondary | ICD-10-CM | POA: Diagnosis not present

## 2021-10-20 DIAGNOSIS — J984 Other disorders of lung: Secondary | ICD-10-CM | POA: Diagnosis not present

## 2021-10-20 DIAGNOSIS — T2037XA Burn of third degree of neck, initial encounter: Secondary | ICD-10-CM | POA: Diagnosis not present

## 2021-10-20 HISTORY — DX: Other psychoactive substance abuse, uncomplicated: F19.10

## 2021-10-20 HISTORY — DX: Gestational diabetes mellitus in pregnancy, unspecified control: O24.419

## 2021-10-20 LAB — COMPREHENSIVE METABOLIC PANEL
ALT: 15 IU/L (ref 0–32)
ALT: 18 U/L (ref 0–44)
AST: 12 IU/L (ref 0–40)
AST: 24 U/L (ref 15–41)
Albumin/Globulin Ratio: 1.2 (ref 1.2–2.2)
Albumin: 3.6 g/dL — ABNORMAL LOW (ref 3.8–4.8)
Albumin: 2.4 g/dL — ABNORMAL LOW (ref 3.5–5.0)
Alkaline Phosphatase: 150 IU/L — ABNORMAL HIGH (ref 44–121)
Alkaline Phosphatase: 114 U/L (ref 38–126)
Anion gap: 13 (ref 5–15)
BUN/Creatinine Ratio: 17 (ref 9–23)
BUN: 7 mg/dL (ref 6–20)
BUN: 8 mg/dL (ref 6–20)
Bilirubin Total: 0.3 mg/dL (ref 0.0–1.2)
CO2: 20 mmol/L (ref 20–29)
CO2: 18 mmol/L — ABNORMAL LOW (ref 22–32)
Calcium: 9.3 mg/dL (ref 8.7–10.2)
Calcium: 7.9 mg/dL — ABNORMAL LOW (ref 8.9–10.3)
Chloride: 100 mmol/L (ref 96–106)
Chloride: 102 mmol/L (ref 98–111)
Creatinine, Ser: 0.41 mg/dL — ABNORMAL LOW (ref 0.57–1.00)
Creatinine, Ser: 0.6 mg/dL (ref 0.44–1.00)
GFR, Estimated: >60 mL/min (ref >60)
Globulin, Total: 3.1 g/dL (ref 1.5–4.5)
Glucose, Bld: 233 mg/dL — ABNORMAL HIGH (ref 70–99)
Glucose: 144 mg/dL — ABNORMAL HIGH (ref 70–99)
Potassium: 3.9 mmol/L (ref 3.5–5.2)
Potassium: 3.3 mmol/L — ABNORMAL LOW (ref 3.5–5.1)
Sodium: 136 mmol/L (ref 134–144)
Sodium: 133 mmol/L — ABNORMAL LOW (ref 135–145)
Total Bilirubin: 0.6 mg/dL (ref 0.3–1.2)
Total Protein: 6.7 g/dL (ref 6.0–8.5)
Total Protein: 6 g/dL — ABNORMAL LOW (ref 6.5–8.1)
eGFR: 133 mL/min/{1.73_m2} (ref >59)

## 2021-10-20 LAB — SAMPLE TO BLOOD BANK

## 2021-10-20 LAB — COOXEMETRY PANEL
Carboxyhemoglobin: 5 % (ref 0.5–1.5)
Methemoglobin: <0.7 % (ref 0.0–1.5)
O2 Saturation: 100 %
Total hemoglobin: 13 g/dL (ref 12.0–16.0)

## 2021-10-20 LAB — I-STAT CHEM 8, ED
BUN: 8 mg/dL (ref 6–20)
Calcium, Ion: 1.09 mmol/L — ABNORMAL LOW (ref 1.15–1.40)
Chloride: 104 mmol/L (ref 98–111)
Creatinine, Ser: 0.4 mg/dL — ABNORMAL LOW (ref 0.44–1.00)
Glucose, Bld: 233 mg/dL — ABNORMAL HIGH (ref 70–99)
HCT: 41 % (ref 36.0–46.0)
Hemoglobin: 13.9 g/dL (ref 12.0–15.0)
Potassium: 3.3 mmol/L — ABNORMAL LOW (ref 3.5–5.1)
Sodium: 136 mmol/L (ref 135–145)
TCO2: 20 mmol/L — ABNORMAL LOW (ref 22–32)

## 2021-10-20 LAB — CBC
HCT: 41.5 % (ref 36.0–46.0)
Hemoglobin: 13.9 g/dL (ref 12.0–15.0)
MCH: 30.1 pg (ref 26.0–34.0)
MCHC: 33.5 g/dL (ref 30.0–36.0)
MCV: 89.8 fL (ref 80.0–100.0)
Platelets: 257 10*3/uL (ref 150–400)
RBC: 4.62 MIL/uL (ref 3.87–5.11)
RDW: 13.5 % (ref 11.5–15.5)
WBC: 14.7 10*3/uL — ABNORMAL HIGH (ref 4.0–10.5)
nRBC: 0 % (ref 0.0–0.2)

## 2021-10-20 LAB — CBC/D/PLT+RPR+RH+ABO+RUBIGG...
Antibody Screen: NEGATIVE
Basophils Absolute: 0 10*3/uL (ref 0.0–0.2)
Basos: 0 %
EOS (ABSOLUTE): 0 10*3/uL (ref 0.0–0.4)
Eos: 0 %
HCV Ab: NONREACTIVE
HIV Screen 4th Generation wRfx: NONREACTIVE
Hematocrit: 43 % (ref 34.0–46.6)
Hemoglobin: 14.6 g/dL (ref 11.1–15.9)
Hepatitis B Surface Ag: NEGATIVE
Immature Grans (Abs): 0.1 10*3/uL (ref 0.0–0.1)
Immature Granulocytes: 1 %
Lymphocytes Absolute: 1.9 10*3/uL (ref 0.7–3.1)
Lymphs: 19 %
MCH: 29.4 pg (ref 26.6–33.0)
MCHC: 34 g/dL (ref 31.5–35.7)
MCV: 87 fL (ref 79–97)
Monocytes Absolute: 0.6 10*3/uL (ref 0.1–0.9)
Monocytes: 6 %
Neutrophils Absolute: 7.4 10*3/uL — ABNORMAL HIGH (ref 1.4–7.0)
Neutrophils: 74 %
Platelets: 279 10*3/uL (ref 150–450)
RBC: 4.96 x10E6/uL (ref 3.77–5.28)
RDW: 12.7 % (ref 11.7–15.4)
RPR Ser Ql: NONREACTIVE
Rh Factor: POSITIVE
Rubella Antibodies, IGG: 16.9 index (ref >0.99)
WBC: 10 10*3/uL (ref 3.4–10.8)

## 2021-10-20 LAB — CERVICOVAGINAL ANCILLARY ONLY
Bacterial Vaginitis (gardnerella): POSITIVE — AB
Candida Glabrata: POSITIVE — AB
Candida Vaginitis: POSITIVE — AB
Chlamydia: NEGATIVE
Comment: NEGATIVE
Comment: NEGATIVE
Comment: NEGATIVE
Comment: NEGATIVE
Comment: NEGATIVE
Comment: NORMAL
Neisseria Gonorrhea: NEGATIVE
Trichomonas: NEGATIVE

## 2021-10-20 LAB — I-STAT ARTERIAL BLOOD GAS, ED
Acid-base deficit: 7 mmol/L — ABNORMAL HIGH (ref 0.0–2.0)
Bicarbonate: 22.3 mmol/L (ref 20.0–28.0)
Calcium, Ion: 1.16 mmol/L (ref 1.15–1.40)
HCT: 37 % (ref 36.0–46.0)
Hemoglobin: 12.6 g/dL (ref 12.0–15.0)
O2 Saturation: 100 %
Patient temperature: 98.6
Potassium: 3.2 mmol/L — ABNORMAL LOW (ref 3.5–5.1)
Sodium: 136 mmol/L (ref 135–145)
TCO2: 24 mmol/L (ref 22–32)
pCO2 arterial: 59.5 mmHg — ABNORMAL HIGH (ref 32–48)
pH, Arterial: 7.181 — CL (ref 7.35–7.45)
pO2, Arterial: 270 mmHg — ABNORMAL HIGH (ref 83–108)

## 2021-10-20 LAB — PROTEIN / CREATININE RATIO, URINE
Creatinine, Urine: 338.8 mg/dL
Protein, Ur: 121.8 mg/dL
Protein/Creat Ratio: 360 mg/g creat — ABNORMAL HIGH (ref 0–200)

## 2021-10-20 LAB — PROTIME-INR
INR: 1 (ref 0.8–1.2)
Prothrombin Time: 13.3 seconds (ref 11.4–15.2)

## 2021-10-20 LAB — HEMOGLOBIN A1C
Est. average glucose Bld gHb Est-mCnc: 154 mg/dL
Hgb A1c MFr Bld: 7 % — ABNORMAL HIGH (ref 4.8–5.6)

## 2021-10-20 LAB — ETHANOL: Alcohol, Ethyl (B): <10 mg/dL (ref <10)

## 2021-10-20 LAB — RESP PANEL BY RT-PCR (FLU A&B, COVID) ARPGX2
Influenza A by PCR: NEGATIVE
Influenza B by PCR: NEGATIVE
SARS Coronavirus 2 by RT PCR: NEGATIVE

## 2021-10-20 LAB — HCV INTERPRETATION

## 2021-10-20 LAB — LACTIC ACID, PLASMA: Lactic Acid, Venous: 4.4 mmol/L (ref 0.5–1.9)

## 2021-10-20 MED ORDER — FENTANYL 2500MCG IN NS 250ML (10MCG/ML) PREMIX INFUSION: 50.0000 ug/h | INTRAVENOUS | Status: DC

## 2021-10-20 MED ORDER — PROPOFOL 1000 MG/100ML IV EMUL
INTRAVENOUS | Status: AC
  Filled 2021-10-20: qty 100

## 2021-10-20 MED ORDER — MIDAZOLAM HCL 2 MG/2ML IJ SOLN
INTRAMUSCULAR | Status: AC
  Administered 2021-10-20: 2 mg via INTRAVENOUS
  Filled 2021-10-20: qty 4

## 2021-10-20 MED ORDER — FENTANYL CITRATE PF 50 MCG/ML IJ SOSY
100.0000 ug | PREFILLED_SYRINGE | Freq: Once | INTRAMUSCULAR | Status: AC
  Administered 2021-10-20: 100 ug via INTRAVENOUS

## 2021-10-20 MED ORDER — LACTATED RINGERS IV SOLN
INTRAVENOUS | Status: DC | PRN
  Administered 2021-10-20: 1000 mL via INTRAVENOUS

## 2021-10-20 MED ORDER — PROPOFOL 1000 MG/100ML IV EMUL
INTRAVENOUS | Status: AC
  Administered 2021-10-20: 20 ug/kg/min via INTRAVENOUS
  Filled 2021-10-20: qty 100

## 2021-10-20 MED ORDER — IOHEXOL 300 MG/ML  SOLN
100.0000 mL | Freq: Once | INTRAMUSCULAR | Status: AC | PRN
  Administered 2021-10-20: 100 mL via INTRAVENOUS

## 2021-10-20 MED ORDER — SODIUM CHLORIDE 0.9 % IV SOLN: 2.0000 g | Freq: Three times a day (TID) | INTRAVENOUS | Status: DC

## 2021-10-20 MED ORDER — FENTANYL CITRATE PF 50 MCG/ML IJ SOSY
PREFILLED_SYRINGE | INTRAMUSCULAR | Status: DC | PRN
  Administered 2021-10-20: 100 ug via INTRAVENOUS

## 2021-10-20 MED ORDER — ROCURONIUM BROMIDE 50 MG/5ML IV SOLN
INTRAVENOUS | Status: DC | PRN
  Administered 2021-10-20: 100 mg via INTRAVENOUS

## 2021-10-20 MED ORDER — MIDAZOLAM HCL 5 MG/5ML IJ SOLN
INTRAMUSCULAR | Status: DC | PRN
  Administered 2021-10-20: 2 mg via INTRAVENOUS

## 2021-10-20 MED ORDER — ETOMIDATE 2 MG/ML IV SOLN
INTRAVENOUS | Status: DC | PRN
  Administered 2021-10-20: 20 mg via INTRAVENOUS

## 2021-10-20 MED ORDER — FENTANYL BOLUS VIA INFUSION
50.0000 ug | INTRAVENOUS | Status: DC | PRN
  Filled 2021-10-20: qty 100

## 2021-10-20 MED ORDER — FENTANYL 2500MCG IN NS 250ML (10MCG/ML) PREMIX INFUSION
INTRAVENOUS | Status: AC
  Administered 2021-10-20: 100 ug/h via INTRAVENOUS
  Filled 2021-10-20: qty 250

## 2021-10-20 MED ORDER — PROPOFOL 1000 MG/100ML IV EMUL: 0.0000 ug/kg/min | INTRAVENOUS | Status: DC

## 2021-10-20 NOTE — ED Notes (Signed)
OB provider arrived ?
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
## 2021-10-20 NOTE — Progress Notes (Signed)
Patient transferred over to [facility] ventilator for transport to [facility]. ?
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
## 2021-10-20 NOTE — ED Notes (Signed)
Patient transported to CT 
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
## 2021-10-20 NOTE — Discharge Planning (Signed)
RNCM following for disposition needs. °Khalis Hittle J. Skyla Champagne, RN, BSN, NCM [phone] ° °

## 2021-10-20 NOTE — ED Notes (Signed)
Started transfer to [facility] via [facility] accepting Dr. Jacquenette Shone ?
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
## 2021-10-20 NOTE — ED Notes (Addendum)
Report to Edd Arbour EMTP w/ [facility]  ?

## 2021-10-20 NOTE — ED Notes (Signed)
Report given Rosalio Macadamia RN, BSN. ?

## 2021-10-20 NOTE — ED Notes (Signed)
Soot and edema noted in airway. ?
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
## 2021-10-20 NOTE — ED Notes (Signed)
Started transfer to [facility] via [facility]  ?
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
## 2021-10-21 ENCOUNTER — Encounter: Payer: Self-pay | Admitting: *Deleted

## 2021-10-21 ENCOUNTER — Encounter: Payer: Self-pay | Admitting: Family Medicine

## 2021-10-21 DIAGNOSIS — T24201A Burn of second degree of unspecified site of right lower limb, except ankle and foot, initial encounter: Secondary | ICD-10-CM | POA: Diagnosis not present

## 2021-10-21 DIAGNOSIS — R918 Other nonspecific abnormal finding of lung field: Secondary | ICD-10-CM | POA: Diagnosis not present

## 2021-10-21 DIAGNOSIS — R6510 Systemic inflammatory response syndrome (SIRS) of non-infectious origin without acute organ dysfunction: Secondary | ICD-10-CM | POA: Diagnosis not present

## 2021-10-21 DIAGNOSIS — T2029XA Burn of second degree of multiple sites of head, face, and neck, initial encounter: Secondary | ICD-10-CM | POA: Diagnosis not present

## 2021-10-21 DIAGNOSIS — Z4659 Encounter for fitting and adjustment of other gastrointestinal appliance and device: Secondary | ICD-10-CM | POA: Diagnosis not present

## 2021-10-21 DIAGNOSIS — T3120 Burns involving 20-29% of body surface with 0% to 9% third degree burns: Secondary | ICD-10-CM | POA: Diagnosis not present

## 2021-10-21 DIAGNOSIS — T2220XA Burn of second degree of shoulder and upper limb, except wrist and hand, unspecified site, initial encounter: Secondary | ICD-10-CM | POA: Diagnosis not present

## 2021-10-21 DIAGNOSIS — T24202A Burn of second degree of unspecified site of left lower limb, except ankle and foot, initial encounter: Secondary | ICD-10-CM | POA: Diagnosis not present

## 2021-10-21 DIAGNOSIS — T25291A Burn of second degree of multiple sites of right ankle and foot, initial encounter: Secondary | ICD-10-CM | POA: Diagnosis not present

## 2021-10-21 DIAGNOSIS — T23202A Burn of second degree of left hand, unspecified site, initial encounter: Secondary | ICD-10-CM | POA: Diagnosis not present

## 2021-10-21 DIAGNOSIS — T25292A Burn of second degree of multiple sites of left ankle and foot, initial encounter: Secondary | ICD-10-CM | POA: Diagnosis not present

## 2021-10-21 DIAGNOSIS — T23201A Burn of second degree of right hand, unspecified site, initial encounter: Secondary | ICD-10-CM | POA: Diagnosis not present

## 2021-10-21 DIAGNOSIS — E119 Type 2 diabetes mellitus without complications: Secondary | ICD-10-CM | POA: Diagnosis not present

## 2021-10-22 DIAGNOSIS — T3120 Burns involving 20-29% of body surface with 0% to 9% third degree burns: Secondary | ICD-10-CM | POA: Diagnosis not present

## 2021-10-22 DIAGNOSIS — T2220XA Burn of second degree of shoulder and upper limb, except wrist and hand, unspecified site, initial encounter: Secondary | ICD-10-CM | POA: Diagnosis not present

## 2021-10-22 DIAGNOSIS — R6510 Systemic inflammatory response syndrome (SIRS) of non-infectious origin without acute organ dysfunction: Secondary | ICD-10-CM | POA: Diagnosis not present

## 2021-10-22 DIAGNOSIS — T24201A Burn of second degree of unspecified site of right lower limb, except ankle and foot, initial encounter: Secondary | ICD-10-CM | POA: Diagnosis not present

## 2021-10-22 DIAGNOSIS — T23202A Burn of second degree of left hand, unspecified site, initial encounter: Secondary | ICD-10-CM | POA: Diagnosis not present

## 2021-10-22 DIAGNOSIS — T25292A Burn of second degree of multiple sites of left ankle and foot, initial encounter: Secondary | ICD-10-CM | POA: Diagnosis not present

## 2021-10-22 DIAGNOSIS — T23201A Burn of second degree of right hand, unspecified site, initial encounter: Secondary | ICD-10-CM | POA: Diagnosis not present

## 2021-10-22 DIAGNOSIS — T24202A Burn of second degree of unspecified site of left lower limb, except ankle and foot, initial encounter: Secondary | ICD-10-CM | POA: Diagnosis not present

## 2021-10-22 DIAGNOSIS — E119 Type 2 diabetes mellitus without complications: Secondary | ICD-10-CM | POA: Diagnosis not present

## 2021-10-22 DIAGNOSIS — T25291A Burn of second degree of multiple sites of right ankle and foot, initial encounter: Secondary | ICD-10-CM | POA: Diagnosis not present

## 2021-10-22 DIAGNOSIS — T2029XA Burn of second degree of multiple sites of head, face, and neck, initial encounter: Secondary | ICD-10-CM | POA: Diagnosis not present

## 2021-10-23 DIAGNOSIS — T25292A Burn of second degree of multiple sites of left ankle and foot, initial encounter: Secondary | ICD-10-CM | POA: Diagnosis not present

## 2021-10-23 DIAGNOSIS — T23292A Burn of second degree of multiple sites of left wrist and hand, initial encounter: Secondary | ICD-10-CM | POA: Diagnosis not present

## 2021-10-23 DIAGNOSIS — T3120 Burns involving 20-29% of body surface with 0% to 9% third degree burns: Secondary | ICD-10-CM | POA: Diagnosis not present

## 2021-10-23 DIAGNOSIS — T23291A Burn of second degree of multiple sites of right wrist and hand, initial encounter: Secondary | ICD-10-CM | POA: Diagnosis not present

## 2021-10-23 DIAGNOSIS — Z4659 Encounter for fitting and adjustment of other gastrointestinal appliance and device: Secondary | ICD-10-CM | POA: Diagnosis not present

## 2021-10-23 DIAGNOSIS — T2220XA Burn of second degree of shoulder and upper limb, except wrist and hand, unspecified site, initial encounter: Secondary | ICD-10-CM | POA: Diagnosis not present

## 2021-10-23 DIAGNOSIS — E119 Type 2 diabetes mellitus without complications: Secondary | ICD-10-CM | POA: Diagnosis not present

## 2021-10-23 DIAGNOSIS — T2029XA Burn of second degree of multiple sites of head, face, and neck, initial encounter: Secondary | ICD-10-CM | POA: Diagnosis not present

## 2021-10-23 DIAGNOSIS — E440 Moderate protein-calorie malnutrition: Secondary | ICD-10-CM | POA: Diagnosis not present

## 2021-10-23 DIAGNOSIS — T25291A Burn of second degree of multiple sites of right ankle and foot, initial encounter: Secondary | ICD-10-CM | POA: Diagnosis not present

## 2021-10-23 LAB — URINE CULTURE, OB REFLEX

## 2021-10-23 LAB — CULTURE, OB URINE

## 2021-10-24 DIAGNOSIS — T24201A Burn of second degree of unspecified site of right lower limb, except ankle and foot, initial encounter: Secondary | ICD-10-CM | POA: Diagnosis not present

## 2021-10-24 DIAGNOSIS — T3120 Burns involving 20-29% of body surface with 0% to 9% third degree burns: Secondary | ICD-10-CM | POA: Diagnosis not present

## 2021-10-24 DIAGNOSIS — T23202A Burn of second degree of left hand, unspecified site, initial encounter: Secondary | ICD-10-CM | POA: Diagnosis not present

## 2021-10-24 DIAGNOSIS — T2029XA Burn of second degree of multiple sites of head, face, and neck, initial encounter: Secondary | ICD-10-CM | POA: Diagnosis not present

## 2021-10-24 DIAGNOSIS — T24202A Burn of second degree of unspecified site of left lower limb, except ankle and foot, initial encounter: Secondary | ICD-10-CM | POA: Diagnosis not present

## 2021-10-24 DIAGNOSIS — R633 Feeding difficulties, unspecified: Secondary | ICD-10-CM | POA: Diagnosis not present

## 2021-10-24 DIAGNOSIS — E119 Type 2 diabetes mellitus without complications: Secondary | ICD-10-CM | POA: Diagnosis not present

## 2021-10-24 DIAGNOSIS — T25291A Burn of second degree of multiple sites of right ankle and foot, initial encounter: Secondary | ICD-10-CM | POA: Diagnosis not present

## 2021-10-24 DIAGNOSIS — T2220XA Burn of second degree of shoulder and upper limb, except wrist and hand, unspecified site, initial encounter: Secondary | ICD-10-CM | POA: Diagnosis not present

## 2021-10-24 DIAGNOSIS — T23201A Burn of second degree of right hand, unspecified site, initial encounter: Secondary | ICD-10-CM | POA: Diagnosis not present

## 2021-10-24 DIAGNOSIS — R1313 Dysphagia, pharyngeal phase: Secondary | ICD-10-CM | POA: Diagnosis not present

## 2021-10-24 DIAGNOSIS — R6510 Systemic inflammatory response syndrome (SIRS) of non-infectious origin without acute organ dysfunction: Secondary | ICD-10-CM | POA: Diagnosis not present

## 2021-10-24 DIAGNOSIS — T25292A Burn of second degree of multiple sites of left ankle and foot, initial encounter: Secondary | ICD-10-CM | POA: Diagnosis not present

## 2021-10-25 ENCOUNTER — Other Ambulatory Visit: Payer: Self-pay

## 2021-10-25 ENCOUNTER — Ambulatory Visit: Payer: Medicaid Other

## 2021-10-25 ENCOUNTER — Encounter: Payer: Medicaid Other | Admitting: Family Medicine

## 2021-10-25 DIAGNOSIS — T23202A Burn of second degree of left hand, unspecified site, initial encounter: Secondary | ICD-10-CM | POA: Diagnosis not present

## 2021-10-25 DIAGNOSIS — T3120 Burns involving 20-29% of body surface with 0% to 9% third degree burns: Secondary | ICD-10-CM | POA: Diagnosis not present

## 2021-10-25 DIAGNOSIS — R6510 Systemic inflammatory response syndrome (SIRS) of non-infectious origin without acute organ dysfunction: Secondary | ICD-10-CM | POA: Diagnosis not present

## 2021-10-25 DIAGNOSIS — E1169 Type 2 diabetes mellitus with other specified complication: Secondary | ICD-10-CM | POA: Diagnosis not present

## 2021-10-25 DIAGNOSIS — T25292A Burn of second degree of multiple sites of left ankle and foot, initial encounter: Secondary | ICD-10-CM | POA: Diagnosis not present

## 2021-10-25 DIAGNOSIS — T24201A Burn of second degree of unspecified site of right lower limb, except ankle and foot, initial encounter: Secondary | ICD-10-CM | POA: Diagnosis not present

## 2021-10-25 DIAGNOSIS — E119 Type 2 diabetes mellitus without complications: Secondary | ICD-10-CM | POA: Diagnosis not present

## 2021-10-25 DIAGNOSIS — T2029XA Burn of second degree of multiple sites of head, face, and neck, initial encounter: Secondary | ICD-10-CM | POA: Diagnosis not present

## 2021-10-25 DIAGNOSIS — E11628 Type 2 diabetes mellitus with other skin complications: Secondary | ICD-10-CM | POA: Diagnosis not present

## 2021-10-25 DIAGNOSIS — E1165 Type 2 diabetes mellitus with hyperglycemia: Secondary | ICD-10-CM | POA: Diagnosis not present

## 2021-10-25 DIAGNOSIS — E8881 Metabolic syndrome: Secondary | ICD-10-CM | POA: Diagnosis not present

## 2021-10-25 DIAGNOSIS — T23201A Burn of second degree of right hand, unspecified site, initial encounter: Secondary | ICD-10-CM | POA: Diagnosis not present

## 2021-10-25 DIAGNOSIS — T24202A Burn of second degree of unspecified site of left lower limb, except ankle and foot, initial encounter: Secondary | ICD-10-CM | POA: Diagnosis not present

## 2021-10-25 DIAGNOSIS — T2220XA Burn of second degree of shoulder and upper limb, except wrist and hand, unspecified site, initial encounter: Secondary | ICD-10-CM | POA: Diagnosis not present

## 2021-10-25 DIAGNOSIS — T25291A Burn of second degree of multiple sites of right ankle and foot, initial encounter: Secondary | ICD-10-CM | POA: Diagnosis not present

## 2021-10-25 LAB — TOXASSURE SELECT 13 (MW), URINE

## 2021-10-26 ENCOUNTER — Encounter: Payer: Medicaid Other | Admitting: Family Medicine

## 2021-10-26 DIAGNOSIS — E1165 Type 2 diabetes mellitus with hyperglycemia: Secondary | ICD-10-CM | POA: Diagnosis not present

## 2021-10-26 DIAGNOSIS — E440 Moderate protein-calorie malnutrition: Secondary | ICD-10-CM | POA: Diagnosis not present

## 2021-10-26 DIAGNOSIS — Z4659 Encounter for fitting and adjustment of other gastrointestinal appliance and device: Secondary | ICD-10-CM | POA: Diagnosis not present

## 2021-10-26 DIAGNOSIS — Z3A33 33 weeks gestation of pregnancy: Secondary | ICD-10-CM | POA: Diagnosis not present

## 2021-10-26 DIAGNOSIS — T3120 Burns involving 20-29% of body surface with 0% to 9% third degree burns: Secondary | ICD-10-CM | POA: Diagnosis not present

## 2021-10-26 DIAGNOSIS — Z6841 Body Mass Index (BMI) 40.0 and over, adult: Secondary | ICD-10-CM | POA: Diagnosis not present

## 2021-10-26 DIAGNOSIS — E1169 Type 2 diabetes mellitus with other specified complication: Secondary | ICD-10-CM | POA: Diagnosis not present

## 2021-10-26 DIAGNOSIS — E8881 Metabolic syndrome: Secondary | ICD-10-CM | POA: Diagnosis not present

## 2021-10-26 DIAGNOSIS — E11628 Type 2 diabetes mellitus with other skin complications: Secondary | ICD-10-CM | POA: Diagnosis not present

## 2021-10-27 DIAGNOSIS — E8881 Metabolic syndrome: Secondary | ICD-10-CM | POA: Diagnosis not present

## 2021-10-27 DIAGNOSIS — E1169 Type 2 diabetes mellitus with other specified complication: Secondary | ICD-10-CM | POA: Diagnosis not present

## 2021-10-27 DIAGNOSIS — T2139XA Burn of third degree of other site of trunk, initial encounter: Secondary | ICD-10-CM | POA: Diagnosis not present

## 2021-10-27 DIAGNOSIS — E1165 Type 2 diabetes mellitus with hyperglycemia: Secondary | ICD-10-CM | POA: Diagnosis not present

## 2021-10-27 DIAGNOSIS — T22352A Burn of third degree of left shoulder, initial encounter: Secondary | ICD-10-CM | POA: Diagnosis not present

## 2021-10-27 DIAGNOSIS — T23301A Burn of third degree of right hand, unspecified site, initial encounter: Secondary | ICD-10-CM | POA: Diagnosis not present

## 2021-10-27 DIAGNOSIS — R000 Tachycardia, unspecified: Secondary | ICD-10-CM | POA: Diagnosis not present

## 2021-10-27 DIAGNOSIS — Z6841 Body Mass Index (BMI) 40.0 and over, adult: Secondary | ICD-10-CM | POA: Diagnosis not present

## 2021-10-27 DIAGNOSIS — T2020XA Burn of second degree of head, face, and neck, unspecified site, initial encounter: Secondary | ICD-10-CM | POA: Diagnosis not present

## 2021-10-27 DIAGNOSIS — T3120 Burns involving 20-29% of body surface with 0% to 9% third degree burns: Secondary | ICD-10-CM | POA: Diagnosis not present

## 2021-10-27 DIAGNOSIS — T2132XA Burn of third degree of abdominal wall, initial encounter: Secondary | ICD-10-CM | POA: Diagnosis not present

## 2021-10-27 DIAGNOSIS — E11628 Type 2 diabetes mellitus with other skin complications: Secondary | ICD-10-CM | POA: Diagnosis not present

## 2021-10-28 DIAGNOSIS — Z6841 Body Mass Index (BMI) 40.0 and over, adult: Secondary | ICD-10-CM | POA: Diagnosis not present

## 2021-10-28 DIAGNOSIS — Z3A33 33 weeks gestation of pregnancy: Secondary | ICD-10-CM | POA: Diagnosis not present

## 2021-10-28 DIAGNOSIS — T3120 Burns involving 20-29% of body surface with 0% to 9% third degree burns: Secondary | ICD-10-CM | POA: Diagnosis not present

## 2021-10-28 DIAGNOSIS — E440 Moderate protein-calorie malnutrition: Secondary | ICD-10-CM | POA: Diagnosis not present

## 2021-10-28 DIAGNOSIS — E11628 Type 2 diabetes mellitus with other skin complications: Secondary | ICD-10-CM | POA: Diagnosis not present

## 2021-10-28 DIAGNOSIS — E1169 Type 2 diabetes mellitus with other specified complication: Secondary | ICD-10-CM | POA: Diagnosis not present

## 2021-10-28 DIAGNOSIS — E1165 Type 2 diabetes mellitus with hyperglycemia: Secondary | ICD-10-CM | POA: Diagnosis not present

## 2021-10-28 DIAGNOSIS — E8881 Metabolic syndrome: Secondary | ICD-10-CM | POA: Diagnosis not present

## 2021-10-28 MED FILL — Midazolam HCl Inj 5 MG/5ML (Base Equivalent): INTRAMUSCULAR | Qty: 5 | Status: AC

## 2021-10-29 DIAGNOSIS — E440 Moderate protein-calorie malnutrition: Secondary | ICD-10-CM | POA: Diagnosis not present

## 2021-10-29 DIAGNOSIS — E1165 Type 2 diabetes mellitus with hyperglycemia: Secondary | ICD-10-CM | POA: Diagnosis not present

## 2021-10-29 DIAGNOSIS — T3120 Burns involving 20-29% of body surface with 0% to 9% third degree burns: Secondary | ICD-10-CM | POA: Diagnosis not present

## 2021-10-30 DIAGNOSIS — T3120 Burns involving 20-29% of body surface with 0% to 9% third degree burns: Secondary | ICD-10-CM | POA: Diagnosis not present

## 2021-10-30 DIAGNOSIS — E1165 Type 2 diabetes mellitus with hyperglycemia: Secondary | ICD-10-CM | POA: Diagnosis not present

## 2021-10-30 DIAGNOSIS — E440 Moderate protein-calorie malnutrition: Secondary | ICD-10-CM | POA: Diagnosis not present

## 2021-10-31 DIAGNOSIS — T3120 Burns involving 20-29% of body surface with 0% to 9% third degree burns: Secondary | ICD-10-CM | POA: Diagnosis not present

## 2021-10-31 DIAGNOSIS — R633 Feeding difficulties, unspecified: Secondary | ICD-10-CM | POA: Diagnosis not present

## 2021-10-31 DIAGNOSIS — E1165 Type 2 diabetes mellitus with hyperglycemia: Secondary | ICD-10-CM | POA: Diagnosis not present

## 2021-10-31 DIAGNOSIS — R1313 Dysphagia, pharyngeal phase: Secondary | ICD-10-CM | POA: Diagnosis not present

## 2021-10-31 DIAGNOSIS — E1169 Type 2 diabetes mellitus with other specified complication: Secondary | ICD-10-CM | POA: Diagnosis not present

## 2021-10-31 DIAGNOSIS — Z6841 Body Mass Index (BMI) 40.0 and over, adult: Secondary | ICD-10-CM | POA: Diagnosis not present

## 2021-10-31 DIAGNOSIS — E8881 Metabolic syndrome: Secondary | ICD-10-CM | POA: Diagnosis not present

## 2021-10-31 DIAGNOSIS — E440 Moderate protein-calorie malnutrition: Secondary | ICD-10-CM | POA: Diagnosis not present

## 2021-10-31 DIAGNOSIS — E11628 Type 2 diabetes mellitus with other skin complications: Secondary | ICD-10-CM | POA: Diagnosis not present

## 2021-11-01 ENCOUNTER — Ambulatory Visit: Payer: Medicaid Other

## 2021-11-01 DIAGNOSIS — E11628 Type 2 diabetes mellitus with other skin complications: Secondary | ICD-10-CM | POA: Diagnosis not present

## 2021-11-01 DIAGNOSIS — E440 Moderate protein-calorie malnutrition: Secondary | ICD-10-CM | POA: Diagnosis not present

## 2021-11-01 DIAGNOSIS — E1169 Type 2 diabetes mellitus with other specified complication: Secondary | ICD-10-CM | POA: Diagnosis not present

## 2021-11-01 DIAGNOSIS — Z4659 Encounter for fitting and adjustment of other gastrointestinal appliance and device: Secondary | ICD-10-CM | POA: Diagnosis not present

## 2021-11-01 DIAGNOSIS — T3120 Burns involving 20-29% of body surface with 0% to 9% third degree burns: Secondary | ICD-10-CM | POA: Diagnosis not present

## 2021-11-01 DIAGNOSIS — R6510 Systemic inflammatory response syndrome (SIRS) of non-infectious origin without acute organ dysfunction: Secondary | ICD-10-CM | POA: Diagnosis not present

## 2021-11-01 DIAGNOSIS — Z6841 Body Mass Index (BMI) 40.0 and over, adult: Secondary | ICD-10-CM | POA: Diagnosis not present

## 2021-11-01 DIAGNOSIS — E1165 Type 2 diabetes mellitus with hyperglycemia: Secondary | ICD-10-CM | POA: Diagnosis not present

## 2021-11-01 DIAGNOSIS — E8881 Metabolic syndrome: Secondary | ICD-10-CM | POA: Diagnosis not present

## 2021-11-02 ENCOUNTER — Telehealth (HOSPITAL_COMMUNITY): Payer: Self-pay | Admitting: *Deleted

## 2021-11-02 DIAGNOSIS — T3120 Burns involving 20-29% of body surface with 0% to 9% third degree burns: Secondary | ICD-10-CM | POA: Diagnosis not present

## 2021-11-02 DIAGNOSIS — Z6841 Body Mass Index (BMI) 40.0 and over, adult: Secondary | ICD-10-CM | POA: Diagnosis not present

## 2021-11-02 DIAGNOSIS — E669 Obesity, unspecified: Secondary | ICD-10-CM | POA: Diagnosis not present

## 2021-11-02 DIAGNOSIS — E8881 Metabolic syndrome: Secondary | ICD-10-CM | POA: Diagnosis not present

## 2021-11-02 DIAGNOSIS — E11628 Type 2 diabetes mellitus with other skin complications: Secondary | ICD-10-CM | POA: Diagnosis not present

## 2021-11-02 DIAGNOSIS — E1169 Type 2 diabetes mellitus with other specified complication: Secondary | ICD-10-CM | POA: Diagnosis not present

## 2021-11-02 DIAGNOSIS — E1165 Type 2 diabetes mellitus with hyperglycemia: Secondary | ICD-10-CM | POA: Diagnosis not present

## 2021-11-02 NOTE — Telephone Encounter (Signed)
Preadmission screen  

## 2021-11-03 ENCOUNTER — Encounter: Payer: Self-pay | Admitting: Family Medicine

## 2021-11-03 DIAGNOSIS — E1169 Type 2 diabetes mellitus with other specified complication: Secondary | ICD-10-CM | POA: Diagnosis not present

## 2021-11-03 DIAGNOSIS — E11628 Type 2 diabetes mellitus with other skin complications: Secondary | ICD-10-CM | POA: Diagnosis not present

## 2021-11-03 DIAGNOSIS — E669 Obesity, unspecified: Secondary | ICD-10-CM | POA: Diagnosis not present

## 2021-11-03 DIAGNOSIS — E1165 Type 2 diabetes mellitus with hyperglycemia: Secondary | ICD-10-CM | POA: Diagnosis not present

## 2021-11-03 DIAGNOSIS — Z6841 Body Mass Index (BMI) 40.0 and over, adult: Secondary | ICD-10-CM | POA: Diagnosis not present

## 2021-11-03 DIAGNOSIS — E440 Moderate protein-calorie malnutrition: Secondary | ICD-10-CM | POA: Diagnosis not present

## 2021-11-03 DIAGNOSIS — E8881 Metabolic syndrome: Secondary | ICD-10-CM | POA: Diagnosis not present

## 2021-11-03 DIAGNOSIS — R6510 Systemic inflammatory response syndrome (SIRS) of non-infectious origin without acute organ dysfunction: Secondary | ICD-10-CM | POA: Diagnosis not present

## 2021-11-04 DIAGNOSIS — E11628 Type 2 diabetes mellitus with other skin complications: Secondary | ICD-10-CM | POA: Diagnosis not present

## 2021-11-04 DIAGNOSIS — E1165 Type 2 diabetes mellitus with hyperglycemia: Secondary | ICD-10-CM | POA: Diagnosis not present

## 2021-11-04 DIAGNOSIS — Z6841 Body Mass Index (BMI) 40.0 and over, adult: Secondary | ICD-10-CM | POA: Diagnosis not present

## 2021-11-04 DIAGNOSIS — E8881 Metabolic syndrome: Secondary | ICD-10-CM | POA: Diagnosis not present

## 2021-11-04 DIAGNOSIS — T3120 Burns involving 20-29% of body surface with 0% to 9% third degree burns: Secondary | ICD-10-CM | POA: Diagnosis not present

## 2021-11-04 DIAGNOSIS — E1169 Type 2 diabetes mellitus with other specified complication: Secondary | ICD-10-CM | POA: Diagnosis not present

## 2021-11-05 DIAGNOSIS — Z419 Encounter for procedure for purposes other than remedying health state, unspecified: Secondary | ICD-10-CM | POA: Diagnosis not present

## 2021-11-07 ENCOUNTER — Telehealth: Payer: Self-pay

## 2021-11-07 NOTE — Telephone Encounter (Signed)
Transition Care Management Unsuccessful Follow-up Telephone Call ? ?Date of discharge and from where:  11/04/2021 from [facility] ? ?Attempts:  1st Attempt ? ?Reason for unsuccessful TCM follow-up call:  Left voice message ? ? ? ?

## 2021-11-08 ENCOUNTER — Other Ambulatory Visit: Payer: Self-pay

## 2021-11-08 ENCOUNTER — Ambulatory Visit: Payer: Medicaid Other

## 2021-11-09 NOTE — Telephone Encounter (Signed)
Transition Care Management Unsuccessful Follow-up Telephone Call ? ?Date of discharge and from where:  11/04/2021 from [facility] ? ?Attempts:  2nd Attempt ? ?Reason for unsuccessful TCM follow-up call:  Left voice message ? ? ? ?

## 2021-11-10 NOTE — Telephone Encounter (Signed)
Transition Care Management Unsuccessful Follow-up Telephone Call ? ?Date of discharge and from where:  11/04/2021 from [facility] ? ?Attempts:  3rd Attempt ? ?Reason for unsuccessful TCM follow-up call:  Unable to leave message ? ? ? ?

## 2021-11-11 ENCOUNTER — Inpatient Hospital Stay (HOSPITAL_COMMUNITY): Admission: AD | Admit: 2021-11-11 | Payer: Medicaid Other | Source: Home / Self Care | Admitting: Family Medicine

## 2021-11-11 ENCOUNTER — Inpatient Hospital Stay (HOSPITAL_COMMUNITY): Payer: Medicaid Other

## 2021-11-14 ENCOUNTER — Ambulatory Visit: Payer: Medicaid Other

## 2021-11-14 ENCOUNTER — Other Ambulatory Visit: Payer: Self-pay

## 2021-11-28 DIAGNOSIS — G8911 Acute pain due to trauma: Secondary | ICD-10-CM | POA: Diagnosis not present

## 2021-11-28 DIAGNOSIS — M792 Neuralgia and neuritis, unspecified: Secondary | ICD-10-CM | POA: Diagnosis not present

## 2021-11-28 DIAGNOSIS — T3120 Burns involving 20-29% of body surface with 0% to 9% third degree burns: Secondary | ICD-10-CM | POA: Diagnosis not present

## 2021-11-28 DIAGNOSIS — L299 Pruritus, unspecified: Secondary | ICD-10-CM | POA: Diagnosis not present

## 2021-12-05 DIAGNOSIS — Z419 Encounter for procedure for purposes other than remedying health state, unspecified: Secondary | ICD-10-CM | POA: Diagnosis not present

## 2021-12-13 DIAGNOSIS — L905 Scar conditions and fibrosis of skin: Secondary | ICD-10-CM | POA: Diagnosis not present

## 2021-12-13 DIAGNOSIS — G8911 Acute pain due to trauma: Secondary | ICD-10-CM | POA: Diagnosis not present

## 2021-12-13 DIAGNOSIS — T3120 Burns involving 20-29% of body surface with 0% to 9% third degree burns: Secondary | ICD-10-CM | POA: Diagnosis not present

## 2021-12-13 DIAGNOSIS — M25641 Stiffness of right hand, not elsewhere classified: Secondary | ICD-10-CM | POA: Diagnosis not present

## 2022-01-05 ENCOUNTER — Telehealth: Payer: Self-pay | Admitting: Clinical

## 2022-01-05 DIAGNOSIS — Z419 Encounter for procedure for purposes other than remedying health state, unspecified: Secondary | ICD-10-CM | POA: Diagnosis not present

## 2022-01-05 NOTE — Telephone Encounter (Signed)
Attempt to call pt regarding referral; Left HIPPA-compliant message to call back [name] from [employer] for [employer] at [facility] at  [phone] [facility] office).

## 2022-01-10 ENCOUNTER — Encounter: Payer: Self-pay | Admitting: *Deleted

## 2022-02-04 DIAGNOSIS — Z419 Encounter for procedure for purposes other than remedying health state, unspecified: Secondary | ICD-10-CM | POA: Diagnosis not present

## 2022-03-07 DIAGNOSIS — Z419 Encounter for procedure for purposes other than remedying health state, unspecified: Secondary | ICD-10-CM | POA: Diagnosis not present

## 2022-04-07 DIAGNOSIS — Z419 Encounter for procedure for purposes other than remedying health state, unspecified: Secondary | ICD-10-CM | POA: Diagnosis not present

## 2022-05-07 DIAGNOSIS — Z419 Encounter for procedure for purposes other than remedying health state, unspecified: Secondary | ICD-10-CM | POA: Diagnosis not present

## 2022-06-07 DIAGNOSIS — Z419 Encounter for procedure for purposes other than remedying health state, unspecified: Secondary | ICD-10-CM | POA: Diagnosis not present

## 2022-07-07 DIAGNOSIS — Z419 Encounter for procedure for purposes other than remedying health state, unspecified: Secondary | ICD-10-CM | POA: Diagnosis not present

## 2022-08-07 DIAGNOSIS — Z419 Encounter for procedure for purposes other than remedying health state, unspecified: Secondary | ICD-10-CM | POA: Diagnosis not present

## 2022-09-07 DIAGNOSIS — Z419 Encounter for procedure for purposes other than remedying health state, unspecified: Secondary | ICD-10-CM | POA: Diagnosis not present

## 2022-10-06 DIAGNOSIS — Z419 Encounter for procedure for purposes other than remedying health state, unspecified: Secondary | ICD-10-CM | POA: Diagnosis not present

## 2022-11-06 DIAGNOSIS — Z419 Encounter for procedure for purposes other than remedying health state, unspecified: Secondary | ICD-10-CM | POA: Diagnosis not present

## 2022-12-06 DIAGNOSIS — Z419 Encounter for procedure for purposes other than remedying health state, unspecified: Secondary | ICD-10-CM | POA: Diagnosis not present

## 2023-01-06 DIAGNOSIS — Z419 Encounter for procedure for purposes other than remedying health state, unspecified: Secondary | ICD-10-CM | POA: Diagnosis not present

## 2023-02-05 DIAGNOSIS — Z419 Encounter for procedure for purposes other than remedying health state, unspecified: Secondary | ICD-10-CM | POA: Diagnosis not present

## 2023-02-22 ENCOUNTER — Telehealth: Payer: Self-pay

## 2023-02-22 NOTE — Telephone Encounter (Signed)
LVM for patient to call back [phone], or to call PCP office to schedule follow up apt. AS, CMA  

## 2023-03-08 DIAGNOSIS — Z419 Encounter for procedure for purposes other than remedying health state, unspecified: Secondary | ICD-10-CM | POA: Diagnosis not present

## 2023-04-08 DIAGNOSIS — Z419 Encounter for procedure for purposes other than remedying health state, unspecified: Secondary | ICD-10-CM | POA: Diagnosis not present

## 2023-05-08 DIAGNOSIS — Z419 Encounter for procedure for purposes other than remedying health state, unspecified: Secondary | ICD-10-CM | POA: Diagnosis not present

## 2023-06-08 DIAGNOSIS — Z419 Encounter for procedure for purposes other than remedying health state, unspecified: Secondary | ICD-10-CM | POA: Diagnosis not present

## 2023-07-08 DIAGNOSIS — Z419 Encounter for procedure for purposes other than remedying health state, unspecified: Secondary | ICD-10-CM | POA: Diagnosis not present

## 2023-08-08 DIAGNOSIS — Z419 Encounter for procedure for purposes other than remedying health state, unspecified: Secondary | ICD-10-CM | POA: Diagnosis not present

## 2023-09-08 DIAGNOSIS — Z419 Encounter for procedure for purposes other than remedying health state, unspecified: Secondary | ICD-10-CM | POA: Diagnosis not present

## 2023-10-06 DIAGNOSIS — Z419 Encounter for procedure for purposes other than remedying health state, unspecified: Secondary | ICD-10-CM | POA: Diagnosis not present

## 2023-11-10 ENCOUNTER — Inpatient Hospital Stay (HOSPITAL_COMMUNITY)
Admission: EM | Admit: 2023-11-10 | Discharge: 2023-11-21 | DRG: 638 | Disposition: A | Discharge status: Home / Self Care | Attending: Internal Medicine | Admitting: Internal Medicine

## 2023-11-10 ENCOUNTER — Encounter (HOSPITAL_COMMUNITY): Payer: Self-pay

## 2023-11-10 ENCOUNTER — Other Ambulatory Visit: Payer: Self-pay

## 2023-11-10 ENCOUNTER — Emergency Department (HOSPITAL_COMMUNITY)

## 2023-11-10 DIAGNOSIS — E119 Type 2 diabetes mellitus without complications: Secondary | ICD-10-CM

## 2023-11-10 DIAGNOSIS — N1330 Unspecified hydronephrosis: Secondary | ICD-10-CM | POA: Diagnosis present

## 2023-11-10 DIAGNOSIS — G062 Extradural and subdural abscess, unspecified: Secondary | ICD-10-CM | POA: Diagnosis present

## 2023-11-10 DIAGNOSIS — G8929 Other chronic pain: Secondary | ICD-10-CM | POA: Diagnosis present

## 2023-11-10 DIAGNOSIS — M4640 Discitis, unspecified, site unspecified: Secondary | ICD-10-CM

## 2023-11-10 DIAGNOSIS — N179 Acute kidney failure, unspecified: Secondary | ICD-10-CM | POA: Diagnosis present

## 2023-11-10 DIAGNOSIS — E538 Deficiency of other specified B group vitamins: Secondary | ICD-10-CM | POA: Diagnosis present

## 2023-11-10 DIAGNOSIS — Z91199 Patient's noncompliance with other medical treatment and regimen due to unspecified reason: Secondary | ICD-10-CM

## 2023-11-10 DIAGNOSIS — Z743 Need for continuous supervision: Secondary | ICD-10-CM | POA: Diagnosis not present

## 2023-11-10 DIAGNOSIS — M48061 Spinal stenosis, lumbar region without neurogenic claudication: Secondary | ICD-10-CM | POA: Diagnosis not present

## 2023-11-10 DIAGNOSIS — W19XXXA Unspecified fall, initial encounter: Secondary | ICD-10-CM | POA: Diagnosis not present

## 2023-11-10 DIAGNOSIS — Z653 Problems related to other legal circumstances: Secondary | ICD-10-CM

## 2023-11-10 DIAGNOSIS — K683 Retroperitoneal hematoma: Secondary | ICD-10-CM

## 2023-11-10 DIAGNOSIS — R197 Diarrhea, unspecified: Secondary | ICD-10-CM | POA: Diagnosis not present

## 2023-11-10 DIAGNOSIS — R531 Weakness: Secondary | ICD-10-CM | POA: Diagnosis not present

## 2023-11-10 DIAGNOSIS — R000 Tachycardia, unspecified: Secondary | ICD-10-CM | POA: Diagnosis not present

## 2023-11-10 DIAGNOSIS — Z79899 Other long term (current) drug therapy: Secondary | ICD-10-CM

## 2023-11-10 DIAGNOSIS — Z8632 Personal history of gestational diabetes: Secondary | ICD-10-CM

## 2023-11-10 DIAGNOSIS — O24119 Pre-existing diabetes mellitus, type 2, in pregnancy, unspecified trimester: Secondary | ICD-10-CM | POA: Diagnosis present

## 2023-11-10 DIAGNOSIS — F331 Major depressive disorder, recurrent, moderate: Secondary | ICD-10-CM | POA: Diagnosis present

## 2023-11-10 DIAGNOSIS — M4626 Osteomyelitis of vertebra, lumbar region: Secondary | ICD-10-CM | POA: Diagnosis present

## 2023-11-10 DIAGNOSIS — Z7984 Long term (current) use of oral hypoglycemic drugs: Secondary | ICD-10-CM

## 2023-11-10 DIAGNOSIS — M4804 Spinal stenosis, thoracic region: Secondary | ICD-10-CM | POA: Diagnosis not present

## 2023-11-10 DIAGNOSIS — R160 Hepatomegaly, not elsewhere classified: Secondary | ICD-10-CM | POA: Diagnosis not present

## 2023-11-10 DIAGNOSIS — M609 Myositis, unspecified: Secondary | ICD-10-CM | POA: Diagnosis present

## 2023-11-10 DIAGNOSIS — R0602 Shortness of breath: Secondary | ICD-10-CM | POA: Diagnosis not present

## 2023-11-10 DIAGNOSIS — M545 Low back pain, unspecified: Secondary | ICD-10-CM | POA: Diagnosis present

## 2023-11-10 DIAGNOSIS — D509 Iron deficiency anemia, unspecified: Secondary | ICD-10-CM | POA: Diagnosis present

## 2023-11-10 DIAGNOSIS — G061 Intraspinal abscess and granuloma: Secondary | ICD-10-CM | POA: Diagnosis present

## 2023-11-10 DIAGNOSIS — R609 Edema, unspecified: Secondary | ICD-10-CM | POA: Diagnosis present

## 2023-11-10 DIAGNOSIS — F17210 Nicotine dependence, cigarettes, uncomplicated: Secondary | ICD-10-CM | POA: Diagnosis present

## 2023-11-10 DIAGNOSIS — Z22322 Carrier or suspected carrier of Methicillin resistant Staphylococcus aureus: Secondary | ICD-10-CM

## 2023-11-10 DIAGNOSIS — M4620 Osteomyelitis of vertebra, site unspecified: Principal | ICD-10-CM

## 2023-11-10 DIAGNOSIS — Z8614 Personal history of Methicillin resistant Staphylococcus aureus infection: Secondary | ICD-10-CM

## 2023-11-10 DIAGNOSIS — M4646 Discitis, unspecified, lumbar region: Secondary | ICD-10-CM | POA: Diagnosis present

## 2023-11-10 DIAGNOSIS — E042 Nontoxic multinodular goiter: Secondary | ICD-10-CM | POA: Diagnosis not present

## 2023-11-10 DIAGNOSIS — K5900 Constipation, unspecified: Secondary | ICD-10-CM | POA: Diagnosis present

## 2023-11-10 DIAGNOSIS — E860 Dehydration: Secondary | ICD-10-CM | POA: Diagnosis present

## 2023-11-10 DIAGNOSIS — Z6838 Body mass index (BMI) 38.0-38.9, adult: Secondary | ICD-10-CM

## 2023-11-10 DIAGNOSIS — D649 Anemia, unspecified: Secondary | ICD-10-CM

## 2023-11-10 DIAGNOSIS — R7989 Other specified abnormal findings of blood chemistry: Secondary | ICD-10-CM | POA: Diagnosis present

## 2023-11-10 DIAGNOSIS — F32A Depression, unspecified: Secondary | ICD-10-CM | POA: Diagnosis present

## 2023-11-10 DIAGNOSIS — E1169 Type 2 diabetes mellitus with other specified complication: Principal | ICD-10-CM | POA: Diagnosis present

## 2023-11-10 DIAGNOSIS — Z635 Disruption of family by separation and divorce: Secondary | ICD-10-CM

## 2023-11-10 DIAGNOSIS — L0291 Cutaneous abscess, unspecified: Secondary | ICD-10-CM | POA: Diagnosis not present

## 2023-11-10 DIAGNOSIS — R110 Nausea: Secondary | ICD-10-CM | POA: Diagnosis not present

## 2023-11-10 DIAGNOSIS — E669 Obesity, unspecified: Secondary | ICD-10-CM | POA: Diagnosis present

## 2023-11-10 DIAGNOSIS — Z833 Family history of diabetes mellitus: Secondary | ICD-10-CM

## 2023-11-10 LAB — COMPREHENSIVE METABOLIC PANEL WITH GFR
ALT: 9 U/L (ref 0–44)
AST: 9 U/L — ABNORMAL LOW (ref 15–41)
Albumin: 2.6 g/dL — ABNORMAL LOW (ref 3.5–5.0)
Alkaline Phosphatase: 73 U/L (ref 38–126)
Anion gap: 13 (ref 5–15)
BUN: 11 mg/dL (ref 6–20)
CO2: 21 mmol/L — ABNORMAL LOW (ref 22–32)
Calcium: 9.1 mg/dL (ref 8.9–10.3)
Chloride: 101 mmol/L (ref 98–111)
Creatinine, Ser: 0.91 mg/dL (ref 0.44–1.00)
GFR, Estimated: >60 mL/min (ref >60)
Glucose, Bld: 119 mg/dL — ABNORMAL HIGH (ref 70–99)
Potassium: 3.8 mmol/L (ref 3.5–5.1)
Sodium: 135 mmol/L (ref 135–145)
Total Bilirubin: 0.2 mg/dL (ref 0.0–1.2)
Total Protein: 8.3 g/dL — ABNORMAL HIGH (ref 6.5–8.1)

## 2023-11-10 LAB — CBC WITH DIFFERENTIAL/PLATELET
Abs Immature Granulocytes: 0.06 10*3/uL (ref 0.00–0.07)
Basophils Absolute: 0 10*3/uL (ref 0.0–0.1)
Basophils Relative: 0 %
Eosinophils Absolute: 0 10*3/uL (ref 0.0–0.5)
Eosinophils Relative: 1 %
HCT: 27.5 % — ABNORMAL LOW (ref 36.0–46.0)
Hemoglobin: 8.1 g/dL — ABNORMAL LOW (ref 12.0–15.0)
Immature Granulocytes: 1 %
Lymphocytes Relative: 15 %
Lymphs Abs: 1.3 10*3/uL (ref 0.7–4.0)
MCH: 22 pg — ABNORMAL LOW (ref 26.0–34.0)
MCHC: 29.5 g/dL — ABNORMAL LOW (ref 30.0–36.0)
MCV: 74.5 fL — ABNORMAL LOW (ref 80.0–100.0)
Monocytes Absolute: 0.6 10*3/uL (ref 0.1–1.0)
Monocytes Relative: 8 %
Neutro Abs: 6.4 10*3/uL (ref 1.7–7.7)
Neutrophils Relative %: 75 %
Platelets: 574 10*3/uL — ABNORMAL HIGH (ref 150–400)
RBC: 3.69 MIL/uL — ABNORMAL LOW (ref 3.87–5.11)
RDW: 14.7 % (ref 11.5–15.5)
WBC: 8.4 10*3/uL (ref 4.0–10.5)
nRBC: 0 % (ref 0.0–0.2)

## 2023-11-10 LAB — URINALYSIS, W/ REFLEX TO CULTURE (INFECTION SUSPECTED)
Bacteria, UA: NONE SEEN
Bilirubin Urine: NEGATIVE
Glucose, UA: NEGATIVE mg/dL
Hgb urine dipstick: NEGATIVE
Ketones, ur: NEGATIVE mg/dL
Leukocytes,Ua: NEGATIVE
Nitrite: NEGATIVE
Protein, ur: NEGATIVE mg/dL
Specific Gravity, Urine: >1.046 — ABNORMAL HIGH (ref 1.005–1.030)
pH: 6 (ref 5.0–8.0)

## 2023-11-10 LAB — RESP PANEL BY RT-PCR (RSV, FLU A&B, COVID)  RVPGX2
Influenza A by PCR: NEGATIVE
Influenza B by PCR: NEGATIVE
Resp Syncytial Virus by PCR: NEGATIVE
SARS Coronavirus 2 by RT PCR: NEGATIVE

## 2023-11-10 LAB — D-DIMER, QUANTITATIVE: D-Dimer, Quant: 1.15 ug{FEU}/mL — ABNORMAL HIGH (ref 0.00–0.50)

## 2023-11-10 LAB — MAGNESIUM: Magnesium: 2 mg/dL (ref 1.7–2.4)

## 2023-11-10 LAB — I-STAT CG4 LACTIC ACID, ED: Lactic Acid, Venous: 0.8 mmol/L (ref 0.5–1.9)

## 2023-11-10 LAB — LIPASE, BLOOD: Lipase: 22 U/L (ref 11–51)

## 2023-11-10 LAB — HCG, SERUM, QUALITATIVE: Preg, Serum: NEGATIVE

## 2023-11-10 MED ORDER — MORPHINE SULFATE (PF) 4 MG/ML IV SOLN
4.0000 mg | Freq: Once | INTRAVENOUS | Status: AC
  Administered 2023-11-10: 4 mg via INTRAVENOUS
  Filled 2023-11-10: qty 1

## 2023-11-10 MED ORDER — ONDANSETRON HCL 4 MG/2ML IJ SOLN
4.0000 mg | Freq: Once | INTRAMUSCULAR | Status: AC
  Administered 2023-11-10: 4 mg via INTRAVENOUS
  Filled 2023-11-10: qty 2

## 2023-11-10 MED ORDER — METHOCARBAMOL 500 MG PO TABS
500.0000 mg | ORAL_TABLET | Freq: Once | ORAL | Status: AC
  Administered 2023-11-10: 500 mg via ORAL
  Filled 2023-11-10: qty 1

## 2023-11-10 MED ORDER — SODIUM CHLORIDE 0.9 % IV BOLUS
1000.0000 mL | Freq: Once | INTRAVENOUS | Status: AC
  Administered 2023-11-10: 1000 mL via INTRAVENOUS

## 2023-11-10 MED ORDER — IOHEXOL 350 MG/ML SOLN
75.0000 mL | Freq: Once | INTRAVENOUS | Status: AC | PRN
  Administered 2023-11-10: 75 mL via INTRAVENOUS

## 2023-11-10 NOTE — ED Triage Notes (Signed)
 [facility], pt c.o diarrhea and general malaise x 1 week. 3-4 episodes of diarrhea a day. Pt c.o right sided lower back pain. Pt appears pale. Pt a.o

## 2023-11-10 NOTE — ED Provider Triage Note (Signed)
 Emergency Medicine Provider Triage Evaluation Note  Krystal [name] , a 38 y.o. female  was evaluated in triage.  Pt complains of 3-4 episodes of nonbloody diarrhea today.  Also reports general fatigue and malaise for 1 week.  Denies any vomiting.  Denies any recent antibiotic use  Review of Systems  Positive: As above Negative: As above  Physical Exam  BP 115/70   Pulse 99   Temp (!) 97.5 F (36.4 C) (Oral)   Resp 20   SpO2 100%  Gen:   Awake, no distress   Resp:  Normal effort  MSK:   Moves extremities without difficulty    Medical Decision Making  Medically screening exam initiated at 9:30 PM.  Appropriate orders placed.  SAVINA OLSHEFSKI was informed that the remainder of the evaluation will be completed by another provider, this initial triage assessment does not replace that evaluation, and the importance of remaining in the ED until their evaluation is complete.     Arabella Merles, PA-C 11/10/23 2130

## 2023-11-10 NOTE — ED Provider Notes (Signed)
 [place] EMERGENCY DEPARTMENT AT [facility] Provider Note   CSN: [id number] Arrival date & time: 11/10/23  1606    History  Chief Complaint  Patient presents with   Fatigue   Diarrhea    Krystal [name] is a 38 y.o. female here for evaluation of diarrhea and generalized weakness.  States she has been having 3-4 episodes of loose stool a day.  No melena, blood per rectum.  She has diffuse posterior right-sided chest and right flank/abdominal pain.  Some nausea without vomiting.  Denies any heavy menstrual cycles.  Occasionally has some shortness of breath.  No cough.  No history of PE or DVT.  No lower extremity swelling.  No numbness.  Feels generally weak. Lightheaded with ambulation.  She has been incarcerated for the last 2 years.  Denies any recent drug or alcohol use.  No hx of IVDU. No chronic NSAID use. Back pain since November. Worse over the last week. Food strike for the last week per sheriff.  [employer] at bedside  Patient gives permission to discuss in front of police at bedside  HPI     Home Medications Prior to Admission medications   Medication Sig Start Date End Date Taking? Authorizing Provider  Accu-Chek Softclix Lancets lancets Use four times daily as instructed. 10/19/21   Venora Maples, MD  Blood Glucose Monitoring Suppl (ACCU-CHEK GUIDE) w/Device KIT 1 Device by Does not apply route in the morning, at noon, in the evening, and at bedtime. 10/19/21   Venora Maples, MD  Buprenorphine HCl-Naloxone HCl (SUBOXONE) 8-2 MG FILM Place 1 Film under the tongue 3 (three) times daily. 09/08/20   Constant, Peggy, MD  glucose blood test strip Use as instructed 10/19/21   Venora Maples, MD  metFORMIN (GLUCOPHAGE) 1000 MG tablet Take 1 tablet (1,000 mg total) by mouth 2 (two) times daily with a meal. 10/19/21   Venora Maples, MD  Prenatal Vit-Fe Fumarate-FA (PREPLUS) 27-1 MG TABS Take 1 tablet by mouth daily. 05/17/20   [place] [facility] Bing, MD       Allergies    Patient has no known allergies.    Review of Systems   Review of Systems  Constitutional: Negative.   HENT: Negative.    Respiratory:  Positive for shortness of breath.   Cardiovascular: Negative.   Gastrointestinal:  Positive for abdominal pain and diarrhea. Negative for abdominal distention, anal bleeding, blood in stool, constipation, nausea, rectal pain and vomiting.  Genitourinary: Negative.   Musculoskeletal: Negative.   Skin: Negative.   Neurological:  Positive for weakness (generalized).  All other systems reviewed and are negative.   Physical Exam Updated Vital Signs BP 136/81   Pulse (!) 103   Temp 98.7 F (37.1 C) (Oral)   Resp (!) 23   SpO2 100%  Physical Exam Vitals and nursing note reviewed.  Constitutional:      General: She is not in acute distress.    Appearance: She is well-developed. She is not ill-appearing, toxic-appearing or diaphoretic.  HENT:     Head: Normocephalic and atraumatic.     Nose: Nose normal.     Mouth/Throat:     Mouth: Mucous membranes are moist.  Eyes:     Pupils: Pupils are equal, round, and reactive to light.  Cardiovascular:     Rate and Rhythm: Tachycardia present.     Pulses: Normal pulses.          Radial pulses are 2+ on the right side and  2+ on the left side.       Dorsalis pedis pulses are 2+ on the right side and 2+ on the left side.     Heart sounds: Normal heart sounds.  Pulmonary:     Effort: Pulmonary effort is normal. No respiratory distress.     Breath sounds: Normal breath sounds.  Abdominal:     General: Bowel sounds are normal. There is no distension.     Tenderness: There is abdominal tenderness. There is right CVA tenderness. There is no left CVA tenderness, guarding or rebound.     Comments: Diffuse right flank pain  Musculoskeletal:        General: No swelling, tenderness, deformity or signs of injury. Normal range of motion.     Cervical back: Normal range of motion.     Right lower  leg: No edema.     Left lower leg: No edema.     Comments: Diffuse right flank and lower posterior thoracic tenderness into right gluteal region. Full ROM to extremities  Skin:    General: Skin is warm and dry.     Capillary Refill: Capillary refill takes less than 2 seconds.     Coloration: Skin is pale.     Comments: Pale. Old scarring diffusely through lower back as well as hands upper back, legs  Neurological:     General: No focal deficit present.     Mental Status: She is alert.     Comments: Intact sensation BIL Equal strength   Psychiatric:        Mood and Affect: Mood normal.    ED Results / Procedures / Treatments   Labs (all labs ordered are listed, but only abnormal results are displayed) Labs Reviewed  CBC WITH DIFFERENTIAL/PLATELET - Abnormal; Notable for the following components:      Result Value   RBC 3.69 (*)    Hemoglobin 8.1 (*)    HCT 27.5 (*)    MCV 74.5 (*)    MCH 22.0 (*)    MCHC 29.5 (*)    Platelets 574 (*)    All other components within normal limits  COMPREHENSIVE METABOLIC PANEL WITH GFR - Abnormal; Notable for the following components:   CO2 21 (*)    Glucose, Bld 119 (*)    Total Protein 8.3 (*)    Albumin 2.6 (*)    AST 9 (*)    All other components within normal limits  D-DIMER, QUANTITATIVE - Abnormal; Notable for the following components:   D-Dimer, Quant 1.15 (*)    All other components within normal limits  URINALYSIS, W/ REFLEX TO CULTURE (INFECTION SUSPECTED) - Abnormal; Notable for the following components:   Color, Urine STRAW (*)    Specific Gravity, Urine >1.046 (*)    All other components within normal limits  RESP PANEL BY RT-PCR (RSV, FLU A&B, COVID)  RVPGX2  GASTROINTESTINAL PANEL BY PCR, STOOL (REPLACES STOOL CULTURE)  CULTURE, BLOOD (ROUTINE X 2)  CULTURE, BLOOD (ROUTINE X 2)  LIPASE, BLOOD  MAGNESIUM  HCG, SERUM, QUALITATIVE  I-STAT CG4 LACTIC ACID, ED   EKG None  Radiology CT Angio Chest PE W and/or Wo  Contrast Result Date: 11/10/2023 CLINICAL DATA:  Right-sided flank pain shortness of breath positive D-dimer EXAM: CT ANGIOGRAPHY CHEST CT ABDOMEN AND PELVIS WITH CONTRAST TECHNIQUE: Multidetector CT imaging of the chest was performed using the standard protocol during bolus administration of intravenous contrast. Multiplanar CT image reconstructions and MIPs were obtained to evaluate the vascular anatomy.  Multidetector CT imaging of the abdomen and pelvis was performed using the standard protocol during bolus administration of intravenous contrast. RADIATION DOSE REDUCTION: This exam was performed according to the departmental dose-optimization program which includes automated exposure control, adjustment of the mA and/or kV according to patient size and/or use of iterative reconstruction technique. CONTRAST:  75mL OMNIPAQUE IOHEXOL 350 MG/ML SOLN COMPARISON:  CT 10/20/2021 FINDINGS: CTA CHEST FINDINGS Cardiovascular: Slightly suboptimal opacification of the pulmonary arteries to the segmental level. No evidence of pulmonary embolism. Normal heart size. No pericardial effusion. Nonaneurysmal aorta. Mediastinum/Nodes: Patent trachea. Slightly enlarged thyroid. Scattered thyroid nodules, largest seen in the inferior left lobe measuring 14 mm, no specific imaging follow-up is recommended. No suspicious lymph nodes. Esophagus within normal limits Lungs/Pleura: Minimal atelectasis at the right middle lobe. No acute airspace disease, pleural effusion or pneumothorax Musculoskeletal: No acute osseous abnormality Review of the MIP images confirms the above findings. CT ABDOMEN and PELVIS FINDINGS Hepatobiliary: Liver is enlarged with craniocaudal measurement of 23 cm. Diffuse decreased hepatic density consistent with steatosis. No calcified gallstone or biliary dilatation Pancreas: Unremarkable. No pancreatic ductal dilatation or surrounding inflammatory changes. Spleen: Normal in size without focal abnormality.  Adrenals/Urinary Tract: Adrenal glands are normal. Diffuse hypoenhancement of the left kidney. Mild to moderate left hydronephrosis and proximal hydroureter. No obstructing stone. The bladder is unremarkable Stomach/Bowel: The stomach is nonenlarged. No dilated small bowel. No acute bowel wall thickening. Negative appendix Vascular/Lymphatic: Nonaneurysmal aorta.  No suspicious lymph nodes. Reproductive: Uterus unremarkable. Complex left adnexal cyst measuring 4.4 by 3.7 cm. Other: Negative for free air. Musculoskeletal: Heterogenous left greater than right psoas muscle enlargement which appears slightly hyperdense. On the left, this extends to the left iliacus muscle where there is also stranding. No active extravasation. Abnormal prevertebral soft tissue thickening extending from about L3-L4 to the L5-S1 level. There is endplate destructive change at L4-L5 and heterogeneous sclerosis at the L4, L5 and S1 vertebral bodies which is new compared to the prior CT. Review of the MIP images confirms the above findings. IMPRESSION: 1. Negative for acute pulmonary embolus. 2. Diffuse hypoenhancement of the left kidney with mild to moderate left hydronephrosis and proximal hydroureter. No obstructing stone. Source of obstruction appears to be heterogenous soft tissue thickening in the left paraspinal region subsequently discussed. 3. Heterogenous left greater than right psoas muscle enlargement which appears slightly hyperdense. On the left, this extends to the left iliacus muscle where there is also stranding. The overall appearance is suspect for retroperitoneal hematoma. There is no active extravasation on this exam. 4. Abnormal prevertebral soft tissue thickening extending from about L3-L4 to the L5-S1 level with endplate destructive change at L4-L5 and heterogeneous sclerosis at the L4, L5 and S1 vertebral bodies. Findings are highly suspicious for discitis/osteomyelitis. MRI of the lumbar spine with and without  contrast is recommended for further evaluation. 5. Hepatomegaly with hepatic steatosis. 6. Complex left adnexal cyst measuring up to 4.4 cm. Correlation with pelvic ultrasound is suggested. Electronically Signed   By: Jasmine Pang M.D.   On: 11/10/2023 22:56   CT ABDOMEN PELVIS W CONTRAST Result Date: 11/10/2023 CLINICAL DATA:  Right-sided flank pain shortness of breath positive D-dimer EXAM: CT ANGIOGRAPHY CHEST CT ABDOMEN AND PELVIS WITH CONTRAST TECHNIQUE: Multidetector CT imaging of the chest was performed using the standard protocol during bolus administration of intravenous contrast. Multiplanar CT image reconstructions and MIPs were obtained to evaluate the vascular anatomy. Multidetector CT imaging of the abdomen and pelvis was performed using the  standard protocol during bolus administration of intravenous contrast. RADIATION DOSE REDUCTION: This exam was performed according to the departmental dose-optimization program which includes automated exposure control, adjustment of the mA and/or kV according to patient size and/or use of iterative reconstruction technique. CONTRAST:  75mL OMNIPAQUE IOHEXOL 350 MG/ML SOLN COMPARISON:  CT 10/20/2021 FINDINGS: CTA CHEST FINDINGS Cardiovascular: Slightly suboptimal opacification of the pulmonary arteries to the segmental level. No evidence of pulmonary embolism. Normal heart size. No pericardial effusion. Nonaneurysmal aorta. Mediastinum/Nodes: Patent trachea. Slightly enlarged thyroid. Scattered thyroid nodules, largest seen in the inferior left lobe measuring 14 mm, no specific imaging follow-up is recommended. No suspicious lymph nodes. Esophagus within normal limits Lungs/Pleura: Minimal atelectasis at the right middle lobe. No acute airspace disease, pleural effusion or pneumothorax Musculoskeletal: No acute osseous abnormality Review of the MIP images confirms the above findings. CT ABDOMEN and PELVIS FINDINGS Hepatobiliary: Liver is enlarged with  craniocaudal measurement of 23 cm. Diffuse decreased hepatic density consistent with steatosis. No calcified gallstone or biliary dilatation Pancreas: Unremarkable. No pancreatic ductal dilatation or surrounding inflammatory changes. Spleen: Normal in size without focal abnormality. Adrenals/Urinary Tract: Adrenal glands are normal. Diffuse hypoenhancement of the left kidney. Mild to moderate left hydronephrosis and proximal hydroureter. No obstructing stone. The bladder is unremarkable Stomach/Bowel: The stomach is nonenlarged. No dilated small bowel. No acute bowel wall thickening. Negative appendix Vascular/Lymphatic: Nonaneurysmal aorta.  No suspicious lymph nodes. Reproductive: Uterus unremarkable. Complex left adnexal cyst measuring 4.4 by 3.7 cm. Other: Negative for free air. Musculoskeletal: Heterogenous left greater than right psoas muscle enlargement which appears slightly hyperdense. On the left, this extends to the left iliacus muscle where there is also stranding. No active extravasation. Abnormal prevertebral soft tissue thickening extending from about L3-L4 to the L5-S1 level. There is endplate destructive change at L4-L5 and heterogeneous sclerosis at the L4, L5 and S1 vertebral bodies which is new compared to the prior CT. Review of the MIP images confirms the above findings. IMPRESSION: 1. Negative for acute pulmonary embolus. 2. Diffuse hypoenhancement of the left kidney with mild to moderate left hydronephrosis and proximal hydroureter. No obstructing stone. Source of obstruction appears to be heterogenous soft tissue thickening in the left paraspinal region subsequently discussed. 3. Heterogenous left greater than right psoas muscle enlargement which appears slightly hyperdense. On the left, this extends to the left iliacus muscle where there is also stranding. The overall appearance is suspect for retroperitoneal hematoma. There is no active extravasation on this exam. 4. Abnormal prevertebral  soft tissue thickening extending from about L3-L4 to the L5-S1 level with endplate destructive change at L4-L5 and heterogeneous sclerosis at the L4, L5 and S1 vertebral bodies. Findings are highly suspicious for discitis/osteomyelitis. MRI of the lumbar spine with and without contrast is recommended for further evaluation. 5. Hepatomegaly with hepatic steatosis. 6. Complex left adnexal cyst measuring up to 4.4 cm. Correlation with pelvic ultrasound is suggested. Electronically Signed   By: Jasmine Pang M.D.   On: 11/10/2023 22:56   DG Chest 2 View Result Date: 11/10/2023 CLINICAL DATA:  Shortness of breath for 2 weeks. EXAM: CHEST - 2 VIEW COMPARISON:  10/20/2021 FINDINGS: The cardiomediastinal contours are normal. The lungs are clear. Pulmonary vasculature is normal. No consolidation, pleural effusion, or pneumothorax. No acute osseous abnormalities are seen. IMPRESSION: Negative radiographs of the chest. Electronically Signed   By: Narda Rutherford M.D.   On: 11/10/2023 17:24    Procedures .Critical Care  Performed by: Linwood Dibbles, PA-C Authorized by:  Fareed Fung A, PA-C   Critical care provider statement:    Critical care time (minutes):  35   Critical care was necessary to treat or prevent imminent or life-threatening deterioration of the following conditions:  Sepsis   Critical care was time spent personally by me on the following activities:  Development of treatment plan with patient or surrogate, discussions with consultants, evaluation of patient's response to treatment, examination of patient, ordering and review of laboratory studies, ordering and review of radiographic studies, ordering and performing treatments and interventions, pulse oximetry, re-evaluation of patient's condition and review of old charts     Medications Ordered in ED Medications  sodium chloride 0.9 % bolus 1,000 mL (0 mLs Intravenous Stopped 11/10/23 2050)  ondansetron (ZOFRAN) injection 4 mg (4 mg  Intravenous Given 11/10/23 1821)  morphine (PF) 4 MG/ML injection 4 mg (4 mg Intravenous Given 11/10/23 1828)  iohexol (OMNIPAQUE) 350 MG/ML injection 75 mL (75 mLs Intravenous Contrast Given 11/10/23 2103)  methocarbamol (ROBAXIN) tablet 500 mg (500 mg Oral Given 11/10/23 2249)  sodium chloride 0.9 % bolus 1,000 mL (0 mLs Intravenous Paused 11/10/23 2320)  ondansetron (ZOFRAN) injection 4 mg (4 mg Intravenous Given 11/10/23 2316)  morphine (PF) 4 MG/ML injection 4 mg (4 mg Intravenous Given 11/10/23 2316)   ED Course/ Medical Decision Making/ A&P   38 year old here with diffuse right-sided pain.  Associated shortness of breath abdominal pain and loose stool.  No melena, bright blood per rectum.  Has been incarcerated for the last 2 years.  Has a nonfocal neuroexam.  Diffuse tenderness to chest and abdomen.  No PE or DVT.  Denies any recent falls or injuries.  No chronic NSAID use.  Plan on labs, imaging and reassess  Police at bedside. Patient gives permission to discuss in front of police.  Labs and imaging personally viewed and interpreted:  CBC without leukocytosis, hemoglobin 8.1--- last 2 years ago 12.6, states she has "normal" menstrual cycles denies any blood in his stool or melena Metabolic panel glucose 119 Viral panel neg Preg neg Lipase 22 Ddimer 1.15 Mag 2.0 Chest xray without significant findings EKG without ischemic changes  Patient reassessed.  Pending imaging.  When to ambulate became tachycardic to the 130s, lightheaded.  She is pending CT reads.  CT a chest negative for acute PE. CT AP hypoattenuation left kidney with moderate hydronephrosis and hydroureter suspect likely due to soft tissue thickening in the paraspinal region.  Also with left greater than right psoas muscle enlargement which appears slightly hyperdense on left extends into the iliac [place] muscle where there is also stranding.  Suspect retroperitoneal hematoma, no extravasation.  There is also abnormal paravertebral  soft tissue thickening with endplate destruction at L4-L5 and sclerosis of vertebral bodies highly suspicious for discitis/osteomyelitis.  Added lactic and Blood cultures   Discussed results with patient.  She denies any trauma. No hx of IVDU. Will order MRI.  Care transferred to [place], [place] who will FU on imaging and dispo                                Medical Decision Making Amount and/or Complexity of Data Reviewed Independent Historian: EMS External Data Reviewed: labs, radiology, ECG and notes. Labs: ordered. Decision-making details documented in ED Course. Radiology: ordered and independent interpretation performed. Decision-making details documented in ED Course. ECG/medicine tests: ordered and independent interpretation performed. Decision-making details documented in ED Course.  Risk OTC  drugs. Prescription drug management. Parenteral controlled substances. Decision regarding hospitalization. Diagnosis or treatment significantly limited by social determinants of health.         Final Clinical Impression(s) / ED Diagnoses Final diagnoses:  Anemia, unspecified type  Retroperitoneal hematoma  Discitis, unspecified spinal region  In police custody    Rx / DC Orders ED Discharge Orders     None         Sherisse Fullilove A, PA-C 11/10/23 2331    Wynetta Fines, MD 11/11/23 0000

## 2023-11-10 NOTE — ED Notes (Signed)
 Patient transported to MRI

## 2023-11-11 ENCOUNTER — Encounter (HOSPITAL_COMMUNITY): Payer: Self-pay | Admitting: Internal Medicine

## 2023-11-11 DIAGNOSIS — N1330 Unspecified hydronephrosis: Secondary | ICD-10-CM | POA: Diagnosis not present

## 2023-11-11 DIAGNOSIS — L0291 Cutaneous abscess, unspecified: Secondary | ICD-10-CM | POA: Diagnosis not present

## 2023-11-11 DIAGNOSIS — N179 Acute kidney failure, unspecified: Secondary | ICD-10-CM | POA: Diagnosis present

## 2023-11-11 DIAGNOSIS — M4626 Osteomyelitis of vertebra, lumbar region: Secondary | ICD-10-CM | POA: Diagnosis not present

## 2023-11-11 DIAGNOSIS — E119 Type 2 diabetes mellitus without complications: Secondary | ICD-10-CM | POA: Diagnosis not present

## 2023-11-11 DIAGNOSIS — Z6838 Body mass index (BMI) 38.0-38.9, adult: Secondary | ICD-10-CM | POA: Diagnosis not present

## 2023-11-11 DIAGNOSIS — M545 Low back pain, unspecified: Secondary | ICD-10-CM | POA: Diagnosis present

## 2023-11-11 DIAGNOSIS — R609 Edema, unspecified: Secondary | ICD-10-CM | POA: Diagnosis present

## 2023-11-11 DIAGNOSIS — R197 Diarrhea, unspecified: Secondary | ICD-10-CM | POA: Diagnosis not present

## 2023-11-11 DIAGNOSIS — D509 Iron deficiency anemia, unspecified: Secondary | ICD-10-CM | POA: Diagnosis not present

## 2023-11-11 DIAGNOSIS — Z635 Disruption of family by separation and divorce: Secondary | ICD-10-CM | POA: Diagnosis not present

## 2023-11-11 DIAGNOSIS — G8929 Other chronic pain: Secondary | ICD-10-CM | POA: Diagnosis not present

## 2023-11-11 DIAGNOSIS — K5900 Constipation, unspecified: Secondary | ICD-10-CM | POA: Diagnosis present

## 2023-11-11 DIAGNOSIS — R531 Weakness: Secondary | ICD-10-CM | POA: Diagnosis not present

## 2023-11-11 DIAGNOSIS — G061 Intraspinal abscess and granuloma: Secondary | ICD-10-CM | POA: Diagnosis present

## 2023-11-11 DIAGNOSIS — R160 Hepatomegaly, not elsewhere classified: Secondary | ICD-10-CM | POA: Diagnosis not present

## 2023-11-11 DIAGNOSIS — E042 Nontoxic multinodular goiter: Secondary | ICD-10-CM | POA: Diagnosis not present

## 2023-11-11 DIAGNOSIS — F17210 Nicotine dependence, cigarettes, uncomplicated: Secondary | ICD-10-CM | POA: Diagnosis not present

## 2023-11-11 DIAGNOSIS — E669 Obesity, unspecified: Secondary | ICD-10-CM | POA: Diagnosis not present

## 2023-11-11 DIAGNOSIS — B9562 Methicillin resistant Staphylococcus aureus infection as the cause of diseases classified elsewhere: Secondary | ICD-10-CM | POA: Diagnosis not present

## 2023-11-11 DIAGNOSIS — M609 Myositis, unspecified: Secondary | ICD-10-CM | POA: Diagnosis present

## 2023-11-11 DIAGNOSIS — D649 Anemia, unspecified: Secondary | ICD-10-CM | POA: Diagnosis not present

## 2023-11-11 DIAGNOSIS — R0602 Shortness of breath: Secondary | ICD-10-CM | POA: Diagnosis not present

## 2023-11-11 DIAGNOSIS — E860 Dehydration: Secondary | ICD-10-CM | POA: Diagnosis not present

## 2023-11-11 DIAGNOSIS — R7881 Bacteremia: Secondary | ICD-10-CM | POA: Diagnosis not present

## 2023-11-11 DIAGNOSIS — M48061 Spinal stenosis, lumbar region without neurogenic claudication: Secondary | ICD-10-CM | POA: Diagnosis not present

## 2023-11-11 DIAGNOSIS — Z91199 Patient's noncompliance with other medical treatment and regimen due to unspecified reason: Secondary | ICD-10-CM | POA: Diagnosis not present

## 2023-11-11 DIAGNOSIS — Z79899 Other long term (current) drug therapy: Secondary | ICD-10-CM | POA: Diagnosis not present

## 2023-11-11 DIAGNOSIS — E1169 Type 2 diabetes mellitus with other specified complication: Secondary | ICD-10-CM | POA: Diagnosis not present

## 2023-11-11 DIAGNOSIS — M4804 Spinal stenosis, thoracic region: Secondary | ICD-10-CM | POA: Diagnosis not present

## 2023-11-11 DIAGNOSIS — E538 Deficiency of other specified B group vitamins: Secondary | ICD-10-CM | POA: Diagnosis not present

## 2023-11-11 DIAGNOSIS — Z22322 Carrier or suspected carrier of Methicillin resistant Staphylococcus aureus: Secondary | ICD-10-CM | POA: Diagnosis not present

## 2023-11-11 DIAGNOSIS — M4646 Discitis, unspecified, lumbar region: Secondary | ICD-10-CM | POA: Diagnosis not present

## 2023-11-11 DIAGNOSIS — G062 Extradural and subdural abscess, unspecified: Secondary | ICD-10-CM | POA: Diagnosis not present

## 2023-11-11 DIAGNOSIS — F331 Major depressive disorder, recurrent, moderate: Secondary | ICD-10-CM | POA: Diagnosis not present

## 2023-11-11 LAB — CBC WITH DIFFERENTIAL/PLATELET
Abs Immature Granulocytes: 0.07 10*3/uL (ref 0.00–0.07)
Basophils Absolute: 0 10*3/uL (ref 0.0–0.1)
Basophils Relative: 0 %
Eosinophils Absolute: 0 10*3/uL (ref 0.0–0.5)
Eosinophils Relative: 1 %
HCT: 25.2 % — ABNORMAL LOW (ref 36.0–46.0)
Hemoglobin: 7.5 g/dL — ABNORMAL LOW (ref 12.0–15.0)
Immature Granulocytes: 1 %
Lymphocytes Relative: 22 %
Lymphs Abs: 1.7 10*3/uL (ref 0.7–4.0)
MCH: 22.3 pg — ABNORMAL LOW (ref 26.0–34.0)
MCHC: 29.8 g/dL — ABNORMAL LOW (ref 30.0–36.0)
MCV: 74.8 fL — ABNORMAL LOW (ref 80.0–100.0)
Monocytes Absolute: 0.7 10*3/uL (ref 0.1–1.0)
Monocytes Relative: 9 %
Neutro Abs: 5.1 10*3/uL (ref 1.7–7.7)
Neutrophils Relative %: 67 %
Platelets: 440 10*3/uL — ABNORMAL HIGH (ref 150–400)
RBC: 3.37 MIL/uL — ABNORMAL LOW (ref 3.87–5.11)
RDW: 14.9 % (ref 11.5–15.5)
WBC: 7.6 10*3/uL (ref 4.0–10.5)
nRBC: 0 % (ref 0.0–0.2)

## 2023-11-11 LAB — COMPREHENSIVE METABOLIC PANEL WITH GFR
ALT: 8 U/L (ref 0–44)
AST: 8 U/L — ABNORMAL LOW (ref 15–41)
Albumin: 2.3 g/dL — ABNORMAL LOW (ref 3.5–5.0)
Alkaline Phosphatase: 65 U/L (ref 38–126)
Anion gap: 10 (ref 5–15)
BUN: 8 mg/dL (ref 6–20)
CO2: 21 mmol/L — ABNORMAL LOW (ref 22–32)
Calcium: 8.6 mg/dL — ABNORMAL LOW (ref 8.9–10.3)
Chloride: 104 mmol/L (ref 98–111)
Creatinine, Ser: 0.99 mg/dL (ref 0.44–1.00)
GFR, Estimated: >60 mL/min (ref >60)
Glucose, Bld: 94 mg/dL (ref 70–99)
Potassium: 3.7 mmol/L (ref 3.5–5.1)
Sodium: 135 mmol/L (ref 135–145)
Total Bilirubin: 0.5 mg/dL (ref 0.0–1.2)
Total Protein: 7.1 g/dL (ref 6.5–8.1)

## 2023-11-11 LAB — TYPE AND SCREEN
ABO/RH(D): A POS
Antibody Screen: NEGATIVE

## 2023-11-11 LAB — SEDIMENTATION RATE: Sed Rate: 135 mm/h — ABNORMAL HIGH (ref 0–22)

## 2023-11-11 LAB — VITAMIN B12: Vitamin B-12: 188 pg/mL (ref 180–914)

## 2023-11-11 LAB — IRON AND TIBC
Iron: 14 ug/dL — ABNORMAL LOW (ref 28–170)
Saturation Ratios: 7 % — ABNORMAL LOW (ref 10.4–31.8)
TIBC: 189 ug/dL — ABNORMAL LOW (ref 250–450)
UIBC: 175 ug/dL

## 2023-11-11 LAB — TSH: TSH: 1.094 u[IU]/mL (ref 0.350–4.500)

## 2023-11-11 LAB — APTT: aPTT: 37 s — ABNORMAL HIGH (ref 24–36)

## 2023-11-11 LAB — MRSA NEXT GEN BY PCR, NASAL: MRSA by PCR Next Gen: DETECTED — AB

## 2023-11-11 LAB — PROTIME-INR
INR: 1.2 (ref 0.8–1.2)
Prothrombin Time: 15.8 s — ABNORMAL HIGH (ref 11.4–15.2)

## 2023-11-11 LAB — HEMOGLOBIN A1C
Hgb A1c MFr Bld: 6.4 % — ABNORMAL HIGH (ref 4.8–5.6)
Mean Plasma Glucose: 136.98 mg/dL

## 2023-11-11 LAB — RETICULOCYTES
Immature Retic Fract: 20 % — ABNORMAL HIGH (ref 2.3–15.9)
RBC.: 3.4 MIL/uL — ABNORMAL LOW (ref 3.87–5.11)
Retic Count, Absolute: 47.6 10*3/uL (ref 19.0–186.0)
Retic Ct Pct: 1.4 % (ref 0.4–3.1)

## 2023-11-11 LAB — FOLATE: Folate: 9.6 ng/mL (ref >5.9)

## 2023-11-11 LAB — I-STAT CG4 LACTIC ACID, ED: Lactic Acid, Venous: 0.4 mmol/L — ABNORMAL LOW (ref 0.5–1.9)

## 2023-11-11 LAB — C-REACTIVE PROTEIN: CRP: 12.3 mg/dL — ABNORMAL HIGH (ref <1.0)

## 2023-11-11 LAB — GLUCOSE, CAPILLARY
Glucose-Capillary: 79 mg/dL (ref 70–99)
Glucose-Capillary: 140 mg/dL — ABNORMAL HIGH (ref 70–99)
Glucose-Capillary: 100 mg/dL — ABNORMAL HIGH (ref 70–99)

## 2023-11-11 LAB — CK: Total CK: 11 U/L — ABNORMAL LOW (ref 38–234)

## 2023-11-11 LAB — MAGNESIUM: Magnesium: 1.9 mg/dL (ref 1.7–2.4)

## 2023-11-11 LAB — FERRITIN: Ferritin: 247 ng/mL (ref 11–307)

## 2023-11-11 MED ORDER — NALOXONE HCL 0.4 MG/ML IJ SOLN: 0.4000 mg | INTRAMUSCULAR | Status: DC | PRN

## 2023-11-11 MED ORDER — SODIUM CHLORIDE 0.9 % IV SOLN: INTRAVENOUS | Status: AC

## 2023-11-11 MED ORDER — ACETAMINOPHEN 650 MG RE SUPP: 650.0000 mg | Freq: Four times a day (QID) | RECTAL | Status: DC | PRN

## 2023-11-11 MED ORDER — HYDROMORPHONE HCL 1 MG/ML IJ SOLN
0.5000 mg | INTRAMUSCULAR | Status: DC | PRN
  Administered 2023-11-11 – 2023-11-16 (×21): 0.5 mg via INTRAVENOUS
  Filled 2023-11-11 (×15): qty 0.5
  Filled 2023-11-11: qty 1
  Filled 2023-11-11 (×5): qty 0.5

## 2023-11-11 MED ORDER — ONDANSETRON HCL 4 MG/2ML IJ SOLN: 4.0000 mg | Freq: Four times a day (QID) | INTRAMUSCULAR | Status: DC | PRN

## 2023-11-11 MED ORDER — INSULIN ASPART 100 UNIT/ML IJ SOLN
0.0000 [IU] | Freq: Four times a day (QID) | INTRAMUSCULAR | Status: DC
  Administered 2023-11-12 – 2023-11-13 (×5): 1 [IU] via SUBCUTANEOUS
  Administered 2023-11-13: 3 [IU] via SUBCUTANEOUS
  Administered 2023-11-13: 2 [IU] via SUBCUTANEOUS
  Administered 2023-11-14 (×2): 1 [IU] via SUBCUTANEOUS

## 2023-11-11 MED ORDER — ACETAMINOPHEN 325 MG PO TABS
650.0000 mg | ORAL_TABLET | Freq: Four times a day (QID) | ORAL | Status: DC | PRN
  Administered 2023-11-12 – 2023-11-21 (×12): 650 mg via ORAL
  Filled 2023-11-11 (×12): qty 2

## 2023-11-11 MED ORDER — OXYCODONE HCL 5 MG PO TABS
5.0000 mg | ORAL_TABLET | ORAL | Status: DC | PRN
  Administered 2023-11-11 – 2023-11-14 (×10): 10 mg via ORAL
  Administered 2023-11-14: 5 mg via ORAL
  Administered 2023-11-14 – 2023-11-21 (×28): 10 mg via ORAL
  Filled 2023-11-11 (×24): qty 2
  Filled 2023-11-11: qty 1
  Filled 2023-11-11: qty 2
  Filled 2023-11-11: qty 1
  Filled 2023-11-11 (×14): qty 2

## 2023-11-11 MED ORDER — MUPIROCIN 2 % EX OINT
1.0000 | TOPICAL_OINTMENT | Freq: Two times a day (BID) | CUTANEOUS | Status: AC
  Administered 2023-11-11 – 2023-11-15 (×10): 1 via NASAL
  Filled 2023-11-11 (×2): qty 22

## 2023-11-11 MED ORDER — CHLORHEXIDINE GLUCONATE CLOTH 2 % EX PADS
6.0000 | MEDICATED_PAD | Freq: Every day | CUTANEOUS | Status: AC
  Administered 2023-11-11 – 2023-11-15 (×5): 6 via TOPICAL

## 2023-11-11 MED ORDER — GADOBUTROL 1 MMOL/ML IV SOLN
10.0000 mL | Freq: Once | INTRAVENOUS | Status: AC | PRN
Start: 2023-11-11 — End: 2023-11-11
  Administered 2023-11-11: 10 mL via INTRAVENOUS

## 2023-11-11 MED ORDER — MELATONIN 3 MG PO TABS
3.0000 mg | ORAL_TABLET | Freq: Every evening | ORAL | Status: DC | PRN
  Administered 2023-11-11: 3 mg via ORAL
  Filled 2023-11-11 (×2): qty 1

## 2023-11-11 MED ORDER — GADOBUTROL 1 MMOL/ML IV SOLN
10.0000 mL | Freq: Once | INTRAVENOUS | Status: AC | PRN
  Administered 2023-11-11: 10 mL via INTRAVENOUS

## 2023-11-11 NOTE — Consult Note (Signed)
 Reason for Consult: Discitis osteomyelitis L4-5 Referring Physician: Hospitalist  Krystal [name] is an 38 y.o. female.   HPI:  38 year old female with a history of IV drug abuse and substance abuse who was admitted with back pain.  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  Blood cultures have been drawn.  She does complain of some back pain over the last month.  She feels generally weak but denies focal weakness in the legs.  No numbness or tingling.  She is not complaining of leg pain.  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   Substance abuse (HCC)     Past Surgical History:  Procedure Laterality Date   ANTERIOR CRUCIATE LIGAMENT REPAIR Left 07/23/2015   Procedure: RECONSTRUCTION ANTERIOR CRUCIATE LIGAMENT (ACL) WITH GRAFTLINK HAMSTRING GRAFT;  Surgeon: Sheral Apley, MD;  Location: [facility];  Service: Orthopedics;  Laterality: Left;   CESAREAN SECTION N/A 08/18/2013   Procedure: CESAREAN SECTION;  Surgeon: Reva Bores, MD;  Location: [facility];  Service: Obstetrics;  Laterality: N/A;   EXCISION BONE CYST     gum   KNEE ARTHROSCOPY WITH MEDIAL MENISECTOMY Left 07/23/2015   Procedure: LEFT KNEE ARTHROSCOPY WITH PARTIAL MEDIAL MENISCECTOMY ;  Surgeon: Sheral Apley, MD;  Location: [facility];  Service: Orthopedics;  Laterality: Left;   KNEE ARTHROSCOPY WITH MENISCAL REPAIR Left 07/23/2015   Procedure: LATERAL MENISCAL REPAIR;  Surgeon: Sheral Apley, MD;  Location: [facility];  Service: Orthopedics;  Laterality: Left;    No Known Allergies  Social History   Tobacco Use   Smoking status: Every Day    Current packs/day: 0.50    Average packs/day: 0.5 packs/day for 10.0 years  (5.0 ttl pk-yrs)    Types: Cigarettes   Smokeless tobacco: Never  Substance Use Topics   Alcohol use: No    Family History  Problem Relation Age of Onset   Asthma Mother    Diabetes Mother    Alcohol abuse Mother    Stroke Mother      Review of Systems  Positive ROS: As above  All other systems have been reviewed and were otherwise negative with the exception of those mentioned in the HPI and as above.  Objective: Vital signs in last 24 hours: Temp:  [97.5 F (36.4 C)-98.7 F (37.1 C)] 98.5 F (36.9 C) (04/06 0902) Pulse Rate:  [93-120] 98 (04/06 0902) Resp:  [13-23] 16 (04/06 0256) BP: (115-136)/(66-90) 127/86 (04/06 0902) SpO2:  [98 %-100 %] 100 % (04/06 0902) Weight:  [102.5 kg] 102.5 kg (04/06 0319)  General Appearance: Alert, cooperative, no distress, appears stated age Head: Normocephalic, without obvious abnormality, atraumatic Eyes: PERRL, conjunctiva/corneas clear, EOM's intact     Throat: benign Neck: Supple, symmetrical, trachea midline Lungs:  respirations unlabored Heart: Regular rate and rhythm Abdomen: Soft, non-tender Extremities: Extremities normal, atraumatic, no cyanosis or edema Pulses: 2+ and symmetric all extremities Skin: Skin color, texture, turgor normal, no rashes or lesions  NEUROLOGIC:   Mental status: A&O x4, no aphasia, good attention span, Memory and fund of knowledge appear to be appropriate Motor Exam - grossly normal, normal tone and bulk Sensory Exam - grossly normal  Reflexes: symmetric, no pathologic reflexes, No Hoffman's, No clonus Coordination - grossly normal Gait -not tested Balance -not tested Cranial Nerves: I: smell Not tested  II: visual acuity  OS: na  OD: na  II: visual fields Full to confrontation  II: pupils Equal, round, reactive to light  III,VII: ptosis None  III,IV,VI: extraocular muscles  Full ROM  V: mastication Normal  V: facial light touch sensation  Normal  V,VII: corneal reflex  Present  VII:  facial muscle function - upper  Normal  VII: facial muscle function - lower Normal  VIII: hearing Not tested  IX: soft palate elevation  Normal  IX,X: gag reflex Present  XI: trapezius strength  5/5  XI: sternocleidomastoid strength 5/5  XI: neck flexion strength  5/5  XII: tongue strength  Normal    Data Review Lab Results  Component Value Date   WBC 7.6 11/11/2023   HGB 7.5 (L) 11/11/2023   HCT 25.2 (L) 11/11/2023   MCV 74.8 (L) 11/11/2023   PLT 440 (H) 11/11/2023   Lab Results  Component Value Date   NA 135 11/11/2023   K 3.7 11/11/2023   CL 104 11/11/2023   CO2 21 (L) 11/11/2023   BUN 8 11/11/2023   CREATININE 0.99 11/11/2023   GLUCOSE 94 11/11/2023   Lab Results  Component Value Date   INR 1.2 11/11/2023    Radiology: MR THORACIC SPINE W WO CONTRAST Result Date: 11/11/2023 CLINICAL DATA:  Mid back pain with infection suspected EXAM: MRI THORACIC AND LUMBAR SPINE WITHOUT AND WITH CONTRAST TECHNIQUE: Multiplanar and multiecho pulse sequences of the thoracic and lumbar spine were obtained without and with intravenous contrast. CONTRAST:  10mL GADAVIST GADOBUTROL 1 MMOL/ML IV SOLN COMPARISON:  None Available. FINDINGS: MRI THORACIC SPINE FINDINGS Alignment:  Physiologic. Vertebrae: No fracture, evidence of discitis, or bone lesion. Cord:  Normal signal and morphology. Paraspinal and other soft tissues: Negative. Disc levels: T6-7: Small central disc protrusion effaces the ventral thecal sac and contacts the spinal cord. T8-9: Small central disc protrusion contacts the ventral spinal cord. The other thoracic disc levels are unremarkable. No abnormal contrast enhancement. MRI LUMBAR SPINE FINDINGS Segmentation:  Standard. Alignment:  Physiologic. Vertebrae: Diffuse bone marrow edema at L4 and L5 with associated contrast enhancement and abnormal signal within the disc space. There is ventral epidural contrast enhancement at both levels that narrows the thecal sac. Conus medullaris:  Extends to the L1 level and appears normal. Paraspinal and other soft tissues: Left hydroureteronephrosis. Bilateral psoas muscle edema and abnormal contrast enhancement without focal fluid collection. Disc levels: L1-L2: Normal disc space and facet joints. No spinal canal stenosis. No neural foraminal stenosis. L2-L3: Normal disc space and facet joints. No spinal canal stenosis. No neural foraminal stenosis. L3-L4: Small disc bulge and mild facet hypertrophy. No spinal canal stenosis. No neural foraminal stenosis. L4-L5: Disc abnormalities as above. Severe spinal canal stenosis. Moderate right and severe left neural foraminal stenosis. L5-S1: Normal disc space and facet joints. No spinal canal stenosis. No neural foraminal stenosis. Visualized sacrum: Normal. IMPRESSION: 1. L4-L5 discitis-osteomyelitis with ventral epidural phlegmon/abscess resulting in severe spinal canal stenosis and moderate right and severe left neural foraminal stenosis. 2. Bilateral psoas muscle edema and abnormal contrast enhancement without focal fluid collection, consistent with myositis. 3. Left hydroureteronephrosis. 4. Small central disc protrusions at T6-7 and T8-9 contact the ventral spinal cord. Electronically Signed   By: Deatra Robinson M.D.   On: 11/11/2023 00:38   MR Lumbar Spine W Wo  Contrast Result Date: 11/11/2023 CLINICAL DATA:  Mid back pain with infection suspected EXAM: MRI THORACIC AND LUMBAR SPINE WITHOUT AND WITH CONTRAST TECHNIQUE: Multiplanar and multiecho pulse sequences of the thoracic and lumbar spine were obtained without and with intravenous contrast. CONTRAST:  10mL GADAVIST GADOBUTROL 1 MMOL/ML IV SOLN COMPARISON:  None Available. FINDINGS: MRI THORACIC SPINE FINDINGS Alignment:  Physiologic. Vertebrae: No fracture, evidence of discitis, or bone lesion. Cord:  Normal signal and morphology. Paraspinal and other soft tissues: Negative. Disc levels: T6-7: Small central disc protrusion effaces the ventral thecal  sac and contacts the spinal cord. T8-9: Small central disc protrusion contacts the ventral spinal cord. The other thoracic disc levels are unremarkable. No abnormal contrast enhancement. MRI LUMBAR SPINE FINDINGS Segmentation:  Standard. Alignment:  Physiologic. Vertebrae: Diffuse bone marrow edema at L4 and L5 with associated contrast enhancement and abnormal signal within the disc space. There is ventral epidural contrast enhancement at both levels that narrows the thecal sac. Conus medullaris: Extends to the L1 level and appears normal. Paraspinal and other soft tissues: Left hydroureteronephrosis. Bilateral psoas muscle edema and abnormal contrast enhancement without focal fluid collection. Disc levels: L1-L2: Normal disc space and facet joints. No spinal canal stenosis. No neural foraminal stenosis. L2-L3: Normal disc space and facet joints. No spinal canal stenosis. No neural foraminal stenosis. L3-L4: Small disc bulge and mild facet hypertrophy. No spinal canal stenosis. No neural foraminal stenosis. L4-L5: Disc abnormalities as above. Severe spinal canal stenosis. Moderate right and severe left neural foraminal stenosis. L5-S1: Normal disc space and facet joints. No spinal canal stenosis. No neural foraminal stenosis. Visualized sacrum: Normal. IMPRESSION: 1. L4-L5 discitis-osteomyelitis with ventral epidural phlegmon/abscess resulting in severe spinal canal stenosis and moderate right and severe left neural foraminal stenosis. 2. Bilateral psoas muscle edema and abnormal contrast enhancement without focal fluid collection, consistent with myositis. 3. Left hydroureteronephrosis. 4. Small central disc protrusions at T6-7 and T8-9 contact the ventral spinal cord. Electronically Signed   By: Deatra Robinson M.D.   On: 11/11/2023 00:38   CT Angio Chest PE W and/or Wo Contrast Result Date: 11/10/2023 CLINICAL DATA:  Right-sided flank pain shortness of breath positive D-dimer EXAM: CT ANGIOGRAPHY CHEST CT ABDOMEN  AND PELVIS WITH CONTRAST TECHNIQUE: Multidetector CT imaging of the chest was performed using the standard protocol during bolus administration of intravenous contrast. Multiplanar CT image reconstructions and MIPs were obtained to evaluate the vascular anatomy. Multidetector CT imaging of the abdomen and pelvis was performed using the standard protocol during bolus administration of intravenous contrast. RADIATION DOSE REDUCTION: This exam was performed according to the departmental dose-optimization program which includes automated exposure control, adjustment of the mA and/or kV according to patient size and/or use of iterative reconstruction technique. CONTRAST:  75mL OMNIPAQUE IOHEXOL 350 MG/ML SOLN COMPARISON:  CT 10/20/2021 FINDINGS: CTA CHEST FINDINGS Cardiovascular: Slightly suboptimal opacification of the pulmonary arteries to the segmental level. No evidence of pulmonary embolism. Normal heart size. No pericardial effusion. Nonaneurysmal aorta. Mediastinum/Nodes: Patent trachea. Slightly enlarged thyroid. Scattered thyroid nodules, largest seen in the inferior left lobe measuring 14 mm, no specific imaging follow-up is recommended. No suspicious lymph nodes. Esophagus within normal limits Lungs/Pleura: Minimal atelectasis at the right middle lobe. No acute airspace disease, pleural effusion or pneumothorax Musculoskeletal: No acute osseous abnormality Review of the MIP images confirms the above findings. CT ABDOMEN and PELVIS FINDINGS Hepatobiliary: Liver is enlarged with craniocaudal measurement of 23 cm. Diffuse decreased hepatic density consistent with steatosis. No calcified gallstone or  biliary dilatation Pancreas: Unremarkable. No pancreatic ductal dilatation or surrounding inflammatory changes. Spleen: Normal in size without focal abnormality. Adrenals/Urinary Tract: Adrenal glands are normal. Diffuse hypoenhancement of the left kidney. Mild to moderate left hydronephrosis and proximal hydroureter.  No obstructing stone. The bladder is unremarkable Stomach/Bowel: The stomach is nonenlarged. No dilated small bowel. No acute bowel wall thickening. Negative appendix Vascular/Lymphatic: Nonaneurysmal aorta.  No suspicious lymph nodes. Reproductive: Uterus unremarkable. Complex left adnexal cyst measuring 4.4 by 3.7 cm. Other: Negative for free air. Musculoskeletal: Heterogenous left greater than right psoas muscle enlargement which appears slightly hyperdense. On the left, this extends to the left iliacus muscle where there is also stranding. No active extravasation. Abnormal prevertebral soft tissue thickening extending from about L3-L4 to the L5-S1 level. There is endplate destructive change at L4-L5 and heterogeneous sclerosis at the L4, L5 and S1 vertebral bodies which is new compared to the prior CT. Review of the MIP images confirms the above findings. IMPRESSION: 1. Negative for acute pulmonary embolus. 2. Diffuse hypoenhancement of the left kidney with mild to moderate left hydronephrosis and proximal hydroureter. No obstructing stone. Source of obstruction appears to be heterogenous soft tissue thickening in the left paraspinal region subsequently discussed. 3. Heterogenous left greater than right psoas muscle enlargement which appears slightly hyperdense. On the left, this extends to the left iliacus muscle where there is also stranding. The overall appearance is suspect for retroperitoneal hematoma. There is no active extravasation on this exam. 4. Abnormal prevertebral soft tissue thickening extending from about L3-L4 to the L5-S1 level with endplate destructive change at L4-L5 and heterogeneous sclerosis at the L4, L5 and S1 vertebral bodies. Findings are highly suspicious for discitis/osteomyelitis. MRI of the lumbar spine with and without contrast is recommended for further evaluation. 5. Hepatomegaly with hepatic steatosis. 6. Complex left adnexal cyst measuring up to 4.4 cm. Correlation with pelvic  ultrasound is suggested. Electronically Signed   By: Jasmine Pang M.D.   On: 11/10/2023 22:56   CT ABDOMEN PELVIS W CONTRAST Result Date: 11/10/2023 CLINICAL DATA:  Right-sided flank pain shortness of breath positive D-dimer EXAM: CT ANGIOGRAPHY CHEST CT ABDOMEN AND PELVIS WITH CONTRAST TECHNIQUE: Multidetector CT imaging of the chest was performed using the standard protocol during bolus administration of intravenous contrast. Multiplanar CT image reconstructions and MIPs were obtained to evaluate the vascular anatomy. Multidetector CT imaging of the abdomen and pelvis was performed using the standard protocol during bolus administration of intravenous contrast. RADIATION DOSE REDUCTION: This exam was performed according to the departmental dose-optimization program which includes automated exposure control, adjustment of the mA and/or kV according to patient size and/or use of iterative reconstruction technique. CONTRAST:  75mL OMNIPAQUE IOHEXOL 350 MG/ML SOLN COMPARISON:  CT 10/20/2021 FINDINGS: CTA CHEST FINDINGS Cardiovascular: Slightly suboptimal opacification of the pulmonary arteries to the segmental level. No evidence of pulmonary embolism. Normal heart size. No pericardial effusion. Nonaneurysmal aorta. Mediastinum/Nodes: Patent trachea. Slightly enlarged thyroid. Scattered thyroid nodules, largest seen in the inferior left lobe measuring 14 mm, no specific imaging follow-up is recommended. No suspicious lymph nodes. Esophagus within normal limits Lungs/Pleura: Minimal atelectasis at the right middle lobe. No acute airspace disease, pleural effusion or pneumothorax Musculoskeletal: No acute osseous abnormality Review of the MIP images confirms the above findings. CT ABDOMEN and PELVIS FINDINGS Hepatobiliary: Liver is enlarged with craniocaudal measurement of 23 cm. Diffuse decreased hepatic density consistent with steatosis. No calcified gallstone or biliary dilatation Pancreas: Unremarkable. No  pancreatic ductal dilatation or surrounding inflammatory  changes. Spleen: Normal in size without focal abnormality. Adrenals/Urinary Tract: Adrenal glands are normal. Diffuse hypoenhancement of the left kidney. Mild to moderate left hydronephrosis and proximal hydroureter. No obstructing stone. The bladder is unremarkable Stomach/Bowel: The stomach is nonenlarged. No dilated small bowel. No acute bowel wall thickening. Negative appendix Vascular/Lymphatic: Nonaneurysmal aorta.  No suspicious lymph nodes. Reproductive: Uterus unremarkable. Complex left adnexal cyst measuring 4.4 by 3.7 cm. Other: Negative for free air. Musculoskeletal: Heterogenous left greater than right psoas muscle enlargement which appears slightly hyperdense. On the left, this extends to the left iliacus muscle where there is also stranding. No active extravasation. Abnormal prevertebral soft tissue thickening extending from about L3-L4 to the L5-S1 level. There is endplate destructive change at L4-L5 and heterogeneous sclerosis at the L4, L5 and S1 vertebral bodies which is new compared to the prior CT. Review of the MIP images confirms the above findings. IMPRESSION: 1. Negative for acute pulmonary embolus. 2. Diffuse hypoenhancement of the left kidney with mild to moderate left hydronephrosis and proximal hydroureter. No obstructing stone. Source of obstruction appears to be heterogenous soft tissue thickening in the left paraspinal region subsequently discussed. 3. Heterogenous left greater than right psoas muscle enlargement which appears slightly hyperdense. On the left, this extends to the left iliacus muscle where there is also stranding. The overall appearance is suspect for retroperitoneal hematoma. There is no active extravasation on this exam. 4. Abnormal prevertebral soft tissue thickening extending from about L3-L4 to the L5-S1 level with endplate destructive change at L4-L5 and heterogeneous sclerosis at the L4, L5 and S1  vertebral bodies. Findings are highly suspicious for discitis/osteomyelitis. MRI of the lumbar spine with and without contrast is recommended for further evaluation. 5. Hepatomegaly with hepatic steatosis. 6. Complex left adnexal cyst measuring up to 4.4 cm. Correlation with pelvic ultrasound is suggested. Electronically Signed   By: Jasmine Pang M.D.   On: 11/10/2023 22:56   DG Chest 2 View Result Date: 11/10/2023 CLINICAL DATA:  Shortness of breath for 2 weeks. EXAM: CHEST - 2 VIEW COMPARISON:  10/20/2021 FINDINGS: The cardiomediastinal contours are normal. The lungs are clear. Pulmonary vasculature is normal. No consolidation, pleural effusion, or pneumothorax. No acute osseous abnormalities are seen. IMPRESSION: Negative radiographs of the chest. Electronically Signed   By: Narda Rutherford M.D.   On: 11/10/2023 17:24     Assessment/Plan: Estimated body mass index is 38.79 kg/m as calculated from the following:   Height as of this encounter: 5\' 4"  (1.626 m).   Weight as of this encounter: 45.41 kg.   38 year old female with discitis osteomyelitis at L4-5.  There is some canal stenosis but I do not feel that it is severe.  There is ventral phlegmon but I do not feel this can be drained per se.  She is neurologically intact.  She is not having radicular pain.  We are not recommending decompressive surgery at this time.  Would recommend to obtain a pathogen through blood cultures which have been done, and potentially disc aspiration by interventional radiology.  Will likely need 6 weeks of IV antibiotics followed by oral antibiotics.   Tia Alert 11/11/2023 11:15 AM

## 2023-11-11 NOTE — Progress Notes (Signed)
 OT Cancellation Note  Patient Details Name: Krystal [name] MRN: [id number] DOB: 08-21-1985   Cancelled Treatment:    Reason Eval/Treat Not Completed: OT screened, no needs identified, will sign off OT speaking with PT, with PT stating that [facility] will be [facility] to [facility] with [facility] antibiotics as soon as able. Patient with no acute OT needs, OT will sign off at this time.   Pollyann Glen E. Martine Bleecker, OTR/L Acute Rehabilitation Services [phone]   Cherlyn Cushing 11/11/2023, 1:34 PM

## 2023-11-11 NOTE — ED Provider Notes (Signed)
  Physical Exam  BP 136/81   Pulse (!) 103   Temp 98.7 F (37.1 C) (Oral)   Resp (!) 23   SpO2 100%   Physical Exam  Procedures  Procedures  ED Course / MDM   Clinical Course as of 11/11/23 0205  Sun Nov 11, 2023  0130 Consulted Verlin Dike of neurosurgery who recommended holding on IV antibiotics.  Admission to hospitalist. [CR]  [phone] Consulted hospitalist Dr. Arlean Hopping who agreed with admission and assume further treatment/care. [CR]    Clinical Course User Index [CR] Peter Garter, PA   Medical Decision Making Amount and/or Complexity of Data Reviewed Labs: ordered. Radiology: ordered.  Risk Prescription drug management. Decision regarding hospitalization.   Patient care handed off from [facility].  See prior note for more full details.  In short, 38 year old female presenting to the emergency department with complaints of generalized weakness, diarrhea, right low back pain with radiation down the right leg.  Feels generally weak but without any specific weakness in her legs.  Denies any numbness in her legs, saddle anesthesia, bowel/bladder dysfunction, fever, history of IV drug use.  Presented with [employer] at bedside.  Plan at shift change was to wait for results of MRI.  Concern for osteomyelitis with possible abscess  Laboratory studies concerning for anemia with a hemoglobin of 8.1 which has dropped 4 g from lab studies done 2 years ago.  No leukocytosis/lactic acidosis.  Imaging studies concerning for osteomyelitis/discitis L4-L5 with epidural abscess, complex left ovarian cyst, myositis bilateral iliopsoas musculature, as well as other results as above.  Consulted neurosurgery regarding the patient who recommended holding on antibiotics right now admission to hospital medicine.  Will review imaging studies and communicate regarding further plan.  Consulted hospitalist agreed with admission.  Treatment plan discussed with patient and she acknowledged understanding  was agreeable to said plan.  Patient stable upon admission.     Peter Garter, [place] 11/11/23 0150    Tilden Fossa, MD 11/11/23 [phone]

## 2023-11-11 NOTE — H&P (Addendum)
 History and Physical      Krystal [name] ZOX:[id number] DOB: 10/28/1985 DOA: 11/10/2023; DOS: 11/11/2023  PCP: Lorayne Bender, MD  Patient coming from: jail   I have personally briefly reviewed patient's old medical records in [facility]  Chief Complaint: Generalized weakness  HPI: Krystal [name] is a 38 y.o. female with medical history significant for type 2 diabetes mellitus, who is admitted to [facility] on 11/10/2023 with L4/L5 osteomyelitis, discitis with associated epidural abscess after presenting from jail to [facility] ED complaining of generalized weakness.    The patient presents from jail with escort from [facility] c/o 3 to 4 days of generalized weakness in the absence of any acute focal weakness.  She conveys that she has been experiencing intermittent low back pain over the last 6 months dating back to November 2024.  She notes that the intensity and frequency of this low back pain has increased over the course the last 1 month.  She describes the pain as sharp, nonradiating discomfort, worse with movement.  Denies any associated acute focal weakness involving the lower extremities, while conveying intermittent paresthesias into the anterior aspect of the left thigh.  Denies any associated saddle anesthesia.  No recent urinary retention.  Denies any associated subjective fever, chills, rigors, or generalized myalgias.  Per chart review, she has a history of chronic opioid abuse, but she denies any history of IV drug abuse.  No recent chest pain or shortness of breath, cough, neck stiffness.  She also reports 3 to 4 days of recurrent loose watery stool at a frequency of 3-4 such episodes per day.  Denies any associated melena or medic easier.  Not associate with any new abdominal discomfort.  No recent dysuria or gross hematuria.  Per chart review, her baseline hemoglobin range appears to be 12.5-14.  Not on any blood thinners as an outpatient.    ED  Course:  Vital signs in the ED were notable for the following: Afebrile; heart rates in the 90s to low 100s; systolic blood pressures in the 1 teens to 130s; respiratory rate 14-23, oxygen saturation 100% on room air.  Labs were notable for the following: CMP was notable for the following: Sodium 135, bicarbonate 21, anion gap 13, creatinine 0.91 compared to most recent prior value of 0.60 in March 2023, glucose 119, calcium adjusted for hypoalbuminemia noted to be 10.3, avidin 2.6.  Otherwise, liver enzymes were within normal limits.  Lipase 22.  Magnesium level 2.0.  CBC notable for white cell count 8400, hemoglobin 8.1 associated with microcytic/hypochromic properties, and relative to most recent prior hemoglobin value of 12.6 in March 2023.  Lactic acid initially 0.8, with repeat value trending down to 0.4.  Urinalysis notable for no blood cells, and was leukocyte esterase/nitrate negative, all demonstrating specific gravity greater than 1.046.  Blood cultures x 2 were collected in the ED.  COVID, influenza, RSV PCR were all negative.  GI panel by PCR is currently pending.  Per my interpretation, EKG in ED demonstrated the following: Sinus rhythm with heart rate 91, normal intervals, no evidence of T wave or ST changes, including no evidence of ST elevation.  Imaging in the ED, per corresponding formal radiology read, was notable for the following: 2 view chest x-ray showed no evidence of acute cardiopulmonary process.  CTA chest with PE protocol showed no evidence of acute cardiopulmonary process, including no evidence of acute pulmonary embolism or any evidence of infiltrate, edema, effusion, or pneumothorax.  CT abdomen/pelvis showed prevertebral soft tissue thickening extending from L3-L4 to the L5-S1 level, with endplate destructive changes at L4-L5 and heterogeneous sclerosis at the L4-L5, S1 vertebral bodies with these findings suggestive of discitis/osteomyelitis.  CT abdomen/pelvis also showed  heterogeneous left greater than right psoas muscle and enlargement with some stranding on the left, raising the possibility of retroperitoneal hematoma, in the absence of any evidence of active extravasation.  This imaging also showed mild to moderate left hydronephrosis and proximal hydroureter on the left, without any evidence of obstructing stone, with the source of this obstructive process.  To be the inflammation associated with the left psoas.  Ensuing MRI of the thoracic and lumbar spine with and without contrast showed evidence of L4-L5 discitis/osteomyelitis with ventral epidural phlegmon/abscess resulting in severe spinal canal stenosis.  MRI also showed bilateral psoas muscle edema without focal fluid collection and no evidence of retroperitoneal hematoma.  EDP spoke with Verlin Dike of neurosurgery, who conveyed that NS will formally consult and see pt in the AM, requesting that we hold off on abx in the meantime.    While in the ED, the following were administered: Morphine 4 mg IV x 2 doses, Robaxin 500 mg p.o. x 1, Zofran 4 mg IV x 2 doses, normal saline x 2 L bolus.  Subsequently, the patient was admitted for further evaluation and management of L4-L5 osteomyelitis/discitis with evidence of epidural abscess as well as reactive bilateral psoas inflammation resulting in left hydroureteral nephrosis complicated by acute kidney injury, with presentation also notable for diarrhea, generalized weakness, and acute microcytic anemia.     Review of Systems: As per HPI otherwise 10 point review of systems negative.   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   Substance abuse (HCC)     Past Surgical History:  Procedure Laterality Date   ANTERIOR CRUCIATE  LIGAMENT REPAIR Left 07/23/2015   Procedure: RECONSTRUCTION ANTERIOR CRUCIATE LIGAMENT (ACL) WITH GRAFTLINK HAMSTRING GRAFT;  Surgeon: Sheral Apley, MD;  Location: [facility];  Service: Orthopedics;  Laterality: Left;   CESAREAN SECTION N/A 08/18/2013   Procedure: CESAREAN SECTION;  Surgeon: Reva Bores, MD;  Location: [facility];  Service: Obstetrics;  Laterality: N/A;   EXCISION BONE CYST     gum   KNEE ARTHROSCOPY WITH MEDIAL MENISECTOMY Left 07/23/2015   Procedure: LEFT KNEE ARTHROSCOPY WITH PARTIAL MEDIAL MENISCECTOMY ;  Surgeon: Sheral Apley, MD;  Location: [facility];  Service: Orthopedics;  Laterality: Left;   KNEE ARTHROSCOPY WITH MENISCAL REPAIR Left 07/23/2015   Procedure: LATERAL MENISCAL REPAIR;  Surgeon: Sheral Apley, MD;  Location: [facility];  Service: Orthopedics;  Laterality: Left;    Social History:  reports that she has been smoking cigarettes. She has a 5 pack-year smoking history. She has never used smokeless tobacco. She reports that she does not drink alcohol and does not use drugs.   No Known Allergies  Family History  Problem Relation Age of Onset   Asthma Mother    Diabetes Mother    Alcohol abuse Mother    Stroke Mother     Family history reviewed and not pertinent    Prior to Admission medications  Medication Sig Start Date End Date Taking? Authorizing Provider  Buprenorphine HCl-Naloxone HCl (SUBOXONE) 8-2 MG FILM Place 1 Film under the tongue 3 (three) times daily. 09/08/20   Constant, Peggy, MD  metFORMIN (GLUCOPHAGE) 1000 MG tablet Take 1 tablet (1,000 mg total) by mouth 2 (two) times daily with a meal. 10/19/21   Venora Maples, MD  Prenatal Vit-Fe Fumarate-FA (PREPLUS) 27-1 MG TABS Take 1 tablet by mouth daily. Patient not taking: Reported on 11/11/2023 05/17/20   Pierre Part Bing, MD     Objective    Physical Exam: Vitals:   11/10/23 2200 11/10/23 2215 11/10/23 2230 11/10/23 2300  BP:  128/84  126/80 136/81  Pulse: (!) 102  98 (!) 103  Resp: 20  19 (!) 23  Temp:  98.7 F (37.1 C)    TempSrc:  Oral    SpO2: 100%  100% 100%    General: appears to be stated age; alert, oriented Skin: warm, dry, no rash Head:  AT/[place] Mouth:  Oral mucosa membranes appear dry, normal dentition Neck: supple; trachea midline Heart: Mildly tachycardic, but regular; did not appreciate any M/R/G Lungs: CTAB, did not appreciate any wheezes, rales, or rhonchi Abdomen: + BS; soft, ND, NT Vascular: 2+ pedal pulses b/l; 2+ radial pulses b/l Extremities: no peripheral edema, no muscle wasting Neuro: sensation intact in upper and lower extremities b/l    Labs on Admission: I have personally reviewed following labs and imaging studies  CBC: Recent Labs  Lab 11/10/23 1632  WBC 8.4  NEUTROABS 6.4  HGB 8.1*  HCT 27.5*  MCV 74.5*  PLT 574*   Basic Metabolic Panel: Recent Labs  Lab 11/10/23 1632  NA 135  K 3.8  CL 101  CO2 21*  GLUCOSE 119*  BUN 11  CREATININE 0.91  CALCIUM 9.1  MG 2.0   GFR: CrCl cannot be calculated (Unknown ideal weight.). Liver Function Tests: Recent Labs  Lab 11/10/23 1632  AST 9*  ALT 9  ALKPHOS 73  BILITOT 0.2  PROT 8.3*  ALBUMIN 2.6*   Recent Labs  Lab 11/10/23 1632  LIPASE 22   No results for input(s): "AMMONIA" in the last 168 hours. Coagulation Profile: No results for input(s): "INR", "PROTIME" in the last 168 hours. Cardiac Enzymes: No results for input(s): "CKTOTAL", "CKMB", "CKMBINDEX", "TROPONINI" in the last 168 hours. BNP (last 3 results) No results for input(s): "PROBNP" in the last 8760 hours. HbA1C: No results for input(s): "HGBA1C" in the last 72 hours. CBG: No results for input(s): "GLUCAP" in the last 168 hours. Lipid Profile: No results for input(s): "CHOL", "HDL", "LDLCALC", "TRIG", "CHOLHDL", "LDLDIRECT" in the last 72 hours. Thyroid Function Tests: No results for input(s): "TSH", "T4TOTAL", "FREET4", "T3FREE",  "THYROIDAB" in the last 72 hours. Anemia Panel: No results for input(s): "VITAMINB12", "FOLATE", "FERRITIN", "TIBC", "IRON", "RETICCTPCT" in the last 72 hours. Urine analysis:    Component Value Date/Time   COLORURINE STRAW (A) 11/10/2023 2302   APPEARANCEUR CLEAR 11/10/2023 2302   LABSPEC >1.046 (H) 11/10/2023 2302   PHURINE 6.0 11/10/2023 2302   GLUCOSEU NEGATIVE 11/10/2023 2302   HGBUR NEGATIVE 11/10/2023 2302   HGBUR negative 11/15/2009 0944   BILIRUBINUR NEGATIVE 11/10/2023 2302   BILIRUBINUR NEG 12/04/2014 1047   KETONESUR NEGATIVE 11/10/2023 2302   PROTEINUR NEGATIVE 11/10/2023 2302   UROBILINOGEN 1.0 07/27/2020 1534   NITRITE NEGATIVE 11/10/2023 2302   LEUKOCYTESUR NEGATIVE 11/10/2023 2302    Radiological Exams on Admission: MR THORACIC SPINE W WO CONTRAST Result Date: 11/11/2023 CLINICAL  DATA:  Mid back pain with infection suspected EXAM: MRI THORACIC AND LUMBAR SPINE WITHOUT AND WITH CONTRAST TECHNIQUE: Multiplanar and multiecho pulse sequences of the thoracic and lumbar spine were obtained without and with intravenous contrast. CONTRAST:  10mL GADAVIST GADOBUTROL 1 MMOL/ML IV SOLN COMPARISON:  None Available. FINDINGS: MRI THORACIC SPINE FINDINGS Alignment:  Physiologic. Vertebrae: No fracture, evidence of discitis, or bone lesion. Cord:  Normal signal and morphology. Paraspinal and other soft tissues: Negative. Disc levels: T6-7: Small central disc protrusion effaces the ventral thecal sac and contacts the spinal cord. T8-9: Small central disc protrusion contacts the ventral spinal cord. The other thoracic disc levels are unremarkable. No abnormal contrast enhancement. MRI LUMBAR SPINE FINDINGS Segmentation:  Standard. Alignment:  Physiologic. Vertebrae: Diffuse bone marrow edema at L4 and L5 with associated contrast enhancement and abnormal signal within the disc space. There is ventral epidural contrast enhancement at both levels that narrows the thecal sac. Conus medullaris:  Extends to the L1 level and appears normal. Paraspinal and other soft tissues: Left hydroureteronephrosis. Bilateral psoas muscle edema and abnormal contrast enhancement without focal fluid collection. Disc levels: L1-L2: Normal disc space and facet joints. No spinal canal stenosis. No neural foraminal stenosis. L2-L3: Normal disc space and facet joints. No spinal canal stenosis. No neural foraminal stenosis. L3-L4: Small disc bulge and mild facet hypertrophy. No spinal canal stenosis. No neural foraminal stenosis. L4-L5: Disc abnormalities as above. Severe spinal canal stenosis. Moderate right and severe left neural foraminal stenosis. L5-S1: Normal disc space and facet joints. No spinal canal stenosis. No neural foraminal stenosis. Visualized sacrum: Normal. IMPRESSION: 1. L4-L5 discitis-osteomyelitis with ventral epidural phlegmon/abscess resulting in severe spinal canal stenosis and moderate right and severe left neural foraminal stenosis. 2. Bilateral psoas muscle edema and abnormal contrast enhancement without focal fluid collection, consistent with myositis. 3. Left hydroureteronephrosis. 4. Small central disc protrusions at T6-7 and T8-9 contact the ventral spinal cord. Electronically Signed   By: Deatra Robinson M.D.   On: 11/11/2023 00:38   MR Lumbar Spine W Wo Contrast Result Date: 11/11/2023 CLINICAL DATA:  Mid back pain with infection suspected EXAM: MRI THORACIC AND LUMBAR SPINE WITHOUT AND WITH CONTRAST TECHNIQUE: Multiplanar and multiecho pulse sequences of the thoracic and lumbar spine were obtained without and with intravenous contrast. CONTRAST:  10mL GADAVIST GADOBUTROL 1 MMOL/ML IV SOLN COMPARISON:  None Available. FINDINGS: MRI THORACIC SPINE FINDINGS Alignment:  Physiologic. Vertebrae: No fracture, evidence of discitis, or bone lesion. Cord:  Normal signal and morphology. Paraspinal and other soft tissues: Negative. Disc levels: T6-7: Small central disc protrusion effaces the ventral thecal  sac and contacts the spinal cord. T8-9: Small central disc protrusion contacts the ventral spinal cord. The other thoracic disc levels are unremarkable. No abnormal contrast enhancement. MRI LUMBAR SPINE FINDINGS Segmentation:  Standard. Alignment:  Physiologic. Vertebrae: Diffuse bone marrow edema at L4 and L5 with associated contrast enhancement and abnormal signal within the disc space. There is ventral epidural contrast enhancement at both levels that narrows the thecal sac. Conus medullaris: Extends to the L1 level and appears normal. Paraspinal and other soft tissues: Left hydroureteronephrosis. Bilateral psoas muscle edema and abnormal contrast enhancement without focal fluid collection. Disc levels: L1-L2: Normal disc space and facet joints. No spinal canal stenosis. No neural foraminal stenosis. L2-L3: Normal disc space and facet joints. No spinal canal stenosis. No neural foraminal stenosis. L3-L4: Small disc bulge and mild facet hypertrophy. No spinal canal stenosis. No neural foraminal stenosis. L4-L5: Disc abnormalities as above.  Severe spinal canal stenosis. Moderate right and severe left neural foraminal stenosis. L5-S1: Normal disc space and facet joints. No spinal canal stenosis. No neural foraminal stenosis. Visualized sacrum: Normal. IMPRESSION: 1. L4-L5 discitis-osteomyelitis with ventral epidural phlegmon/abscess resulting in severe spinal canal stenosis and moderate right and severe left neural foraminal stenosis. 2. Bilateral psoas muscle edema and abnormal contrast enhancement without focal fluid collection, consistent with myositis. 3. Left hydroureteronephrosis. 4. Small central disc protrusions at T6-7 and T8-9 contact the ventral spinal cord. Electronically Signed   By: Deatra Robinson M.D.   On: 11/11/2023 00:38   CT Angio Chest PE W and/or Wo Contrast Result Date: 11/10/2023 CLINICAL DATA:  Right-sided flank pain shortness of breath positive D-dimer EXAM: CT ANGIOGRAPHY CHEST CT ABDOMEN  AND PELVIS WITH CONTRAST TECHNIQUE: Multidetector CT imaging of the chest was performed using the standard protocol during bolus administration of intravenous contrast. Multiplanar CT image reconstructions and MIPs were obtained to evaluate the vascular anatomy. Multidetector CT imaging of the abdomen and pelvis was performed using the standard protocol during bolus administration of intravenous contrast. RADIATION DOSE REDUCTION: This exam was performed according to the departmental dose-optimization program which includes automated exposure control, adjustment of the mA and/or kV according to patient size and/or use of iterative reconstruction technique. CONTRAST:  75mL OMNIPAQUE IOHEXOL 350 MG/ML SOLN COMPARISON:  CT 10/20/2021 FINDINGS: CTA CHEST FINDINGS Cardiovascular: Slightly suboptimal opacification of the pulmonary arteries to the segmental level. No evidence of pulmonary embolism. Normal heart size. No pericardial effusion. Nonaneurysmal aorta. Mediastinum/Nodes: Patent trachea. Slightly enlarged thyroid. Scattered thyroid nodules, largest seen in the inferior left lobe measuring 14 mm, no specific imaging follow-up is recommended. No suspicious lymph nodes. Esophagus within normal limits Lungs/Pleura: Minimal atelectasis at the right middle lobe. No acute airspace disease, pleural effusion or pneumothorax Musculoskeletal: No acute osseous abnormality Review of the MIP images confirms the above findings. CT ABDOMEN and PELVIS FINDINGS Hepatobiliary: Liver is enlarged with craniocaudal measurement of 23 cm. Diffuse decreased hepatic density consistent with steatosis. No calcified gallstone or biliary dilatation Pancreas: Unremarkable. No pancreatic ductal dilatation or surrounding inflammatory changes. Spleen: Normal in size without focal abnormality. Adrenals/Urinary Tract: Adrenal glands are normal. Diffuse hypoenhancement of the left kidney. Mild to moderate left hydronephrosis and proximal hydroureter.  No obstructing stone. The bladder is unremarkable Stomach/Bowel: The stomach is nonenlarged. No dilated small bowel. No acute bowel wall thickening. Negative appendix Vascular/Lymphatic: Nonaneurysmal aorta.  No suspicious lymph nodes. Reproductive: Uterus unremarkable. Complex left adnexal cyst measuring 4.4 by 3.7 cm. Other: Negative for free air. Musculoskeletal: Heterogenous left greater than right psoas muscle enlargement which appears slightly hyperdense. On the left, this extends to the left iliacus muscle where there is also stranding. No active extravasation. Abnormal prevertebral soft tissue thickening extending from about L3-L4 to the L5-S1 level. There is endplate destructive change at L4-L5 and heterogeneous sclerosis at the L4, L5 and S1 vertebral bodies which is new compared to the prior CT. Review of the MIP images confirms the above findings. IMPRESSION: 1. Negative for acute pulmonary embolus. 2. Diffuse hypoenhancement of the left kidney with mild to moderate left hydronephrosis and proximal hydroureter. No obstructing stone. Source of obstruction appears to be heterogenous soft tissue thickening in the left paraspinal region subsequently discussed. 3. Heterogenous left greater than right psoas muscle enlargement which appears slightly hyperdense. On the left, this extends to the left iliacus muscle where there is also stranding. The overall appearance is suspect for retroperitoneal hematoma. There is no  active extravasation on this exam. 4. Abnormal prevertebral soft tissue thickening extending from about L3-L4 to the L5-S1 level with endplate destructive change at L4-L5 and heterogeneous sclerosis at the L4, L5 and S1 vertebral bodies. Findings are highly suspicious for discitis/osteomyelitis. MRI of the lumbar spine with and without contrast is recommended for further evaluation. 5. Hepatomegaly with hepatic steatosis. 6. Complex left adnexal cyst measuring up to 4.4 cm. Correlation with pelvic  ultrasound is suggested. Electronically Signed   By: Jasmine Pang M.D.   On: 11/10/2023 22:56   CT ABDOMEN PELVIS W CONTRAST Result Date: 11/10/2023 CLINICAL DATA:  Right-sided flank pain shortness of breath positive D-dimer EXAM: CT ANGIOGRAPHY CHEST CT ABDOMEN AND PELVIS WITH CONTRAST TECHNIQUE: Multidetector CT imaging of the chest was performed using the standard protocol during bolus administration of intravenous contrast. Multiplanar CT image reconstructions and MIPs were obtained to evaluate the vascular anatomy. Multidetector CT imaging of the abdomen and pelvis was performed using the standard protocol during bolus administration of intravenous contrast. RADIATION DOSE REDUCTION: This exam was performed according to the departmental dose-optimization program which includes automated exposure control, adjustment of the mA and/or kV according to patient size and/or use of iterative reconstruction technique. CONTRAST:  75mL OMNIPAQUE IOHEXOL 350 MG/ML SOLN COMPARISON:  CT 10/20/2021 FINDINGS: CTA CHEST FINDINGS Cardiovascular: Slightly suboptimal opacification of the pulmonary arteries to the segmental level. No evidence of pulmonary embolism. Normal heart size. No pericardial effusion. Nonaneurysmal aorta. Mediastinum/Nodes: Patent trachea. Slightly enlarged thyroid. Scattered thyroid nodules, largest seen in the inferior left lobe measuring 14 mm, no specific imaging follow-up is recommended. No suspicious lymph nodes. Esophagus within normal limits Lungs/Pleura: Minimal atelectasis at the right middle lobe. No acute airspace disease, pleural effusion or pneumothorax Musculoskeletal: No acute osseous abnormality Review of the MIP images confirms the above findings. CT ABDOMEN and PELVIS FINDINGS Hepatobiliary: Liver is enlarged with craniocaudal measurement of 23 cm. Diffuse decreased hepatic density consistent with steatosis. No calcified gallstone or biliary dilatation Pancreas: Unremarkable. No  pancreatic ductal dilatation or surrounding inflammatory changes. Spleen: Normal in size without focal abnormality. Adrenals/Urinary Tract: Adrenal glands are normal. Diffuse hypoenhancement of the left kidney. Mild to moderate left hydronephrosis and proximal hydroureter. No obstructing stone. The bladder is unremarkable Stomach/Bowel: The stomach is nonenlarged. No dilated small bowel. No acute bowel wall thickening. Negative appendix Vascular/Lymphatic: Nonaneurysmal aorta.  No suspicious lymph nodes. Reproductive: Uterus unremarkable. Complex left adnexal cyst measuring 4.4 by 3.7 cm. Other: Negative for free air. Musculoskeletal: Heterogenous left greater than right psoas muscle enlargement which appears slightly hyperdense. On the left, this extends to the left iliacus muscle where there is also stranding. No active extravasation. Abnormal prevertebral soft tissue thickening extending from about L3-L4 to the L5-S1 level. There is endplate destructive change at L4-L5 and heterogeneous sclerosis at the L4, L5 and S1 vertebral bodies which is new compared to the prior CT. Review of the MIP images confirms the above findings. IMPRESSION: 1. Negative for acute pulmonary embolus. 2. Diffuse hypoenhancement of the left kidney with mild to moderate left hydronephrosis and proximal hydroureter. No obstructing stone. Source of obstruction appears to be heterogenous soft tissue thickening in the left paraspinal region subsequently discussed. 3. Heterogenous left greater than right psoas muscle enlargement which appears slightly hyperdense. On the left, this extends to the left iliacus muscle where there is also stranding. The overall appearance is suspect for retroperitoneal hematoma. There is no active extravasation on this exam. 4. Abnormal prevertebral soft tissue thickening extending  from about L3-L4 to the L5-S1 level with endplate destructive change at L4-L5 and heterogeneous sclerosis at the L4, L5 and S1  vertebral bodies. Findings are highly suspicious for discitis/osteomyelitis. MRI of the lumbar spine with and without contrast is recommended for further evaluation. 5. Hepatomegaly with hepatic steatosis. 6. Complex left adnexal cyst measuring up to 4.4 cm. Correlation with pelvic ultrasound is suggested. Electronically Signed   By: Jasmine Pang M.D.   On: 11/10/2023 22:56   DG Chest 2 View Result Date: 11/10/2023 CLINICAL DATA:  Shortness of breath for 2 weeks. EXAM: CHEST - 2 VIEW COMPARISON:  10/20/2021 FINDINGS: The cardiomediastinal contours are normal. The lungs are clear. Pulmonary vasculature is normal. No consolidation, pleural effusion, or pneumothorax. No acute osseous abnormalities are seen. IMPRESSION: Negative radiographs of the chest. Electronically Signed   By: Narda Rutherford M.D.   On: 11/10/2023 17:24      Assessment/Plan   Principal Problem:   Osteomyelitis of lumbar spine (HCC) Active Problems:   Diarrhea   DM2 (diabetes mellitus, type 2) (HCC)   Abscess in epidural space of lumbar spine   Low back pain   Hydroureteronephrosis   AKI (acute kidney injury) (HCC)   Generalized weakness   Microcytic anemia    #) L4-L5 osteomyelitis/discitis with epidural abscess: Diagnosis on the basis of 1 months of progressive low back discomfort with MRI of the thoracic/lumbar spine with and without contrast showing evidence of L4-L5 discitis/osteomyelitis along with epidural abscess resulting in severe spinal canal stenosis.   In the absence of fever and in the absence of leukocytosis, SIRS criteria for sepsis are not currently met.  She appears hemodynamically stable.  Blood cultures x 2 were collected in the ED this evening.  It is noted that the patient denies any history of IV drug abuse.  No evidence of associated red flag signs.  while she does report some intermittent paresthesias into the anterior aspect of the left thigh, she denies any associated send will anesthesia.  EDP  spoke with Verlin Dike of neurosurgery, who conveyed that NS will formally consult and see pt in the AM, requesting that we hold off on abx in the meantime.    Plan: Normal saline at 100 cc/h x 12 hours.  Follow-up results of blood cultures x 2.  Neurosurgery to formally consult and see the patient in the morning.  Refraining from initiation of IV antibiotics at the request of neurosurgery.  Repeat CBC in the morning.  Type and screen ordered.  Check CRP and ESR.  Check urinary drug screen.  Prn IV Dilaudid.  Prn IV Zofran.  Fall precautions ordered.                 #) Bilateral psoas inflammation: Identified on today CT abdomen/pelvis as well as MRI of the thoracic/lumbar spine, left greater than right, with MRI showing no evidence of focal fluid collection or evidence to suggest retroperitoneal Thoma.  Suspected to be reactive as a consequence of aforementioned L4-L5 osteomyelitis/discitis with evidence of epidural abscess.  In the setting of associated myositis, will also add on CPK level given the patient's report of generalized weakness with labs reflecting acute kidney injury.  Given the reactive appearance of these findings, we will focus treatment on underlying source, which appears to be the L4-L5 spondylitis/discitis/epidural abscess.  Plan: Check CPK level, as above.  Further evaluation and management of L4-L5 osteomyelitis/discitis/epidural abscess, as above, including neurosurgery consult, as above.  Repeat CBC in the morning.  Prn IV Dilaudid.                      #) Acute Kidney Injury:   Presenting creatinine found to be 0.61 compared to most recent prior value of 0.60 in March 2023.  Suspect that this is multifactorial with both prerenal and postrenal contributions.  Suspect prerenal contribution from clinical evidence to suggest mild dehydration context of the patient's recent diarrhea.  Additionally, there appears to be a postrenal obstructive given  CT abdomen/pelvis showing evidence of left hydroureteronephrosis,with this obstruction on the basis of the patient's left greater than right psoas inflammation, as above, with CT abdomen/pelvis showing no evidence of obstructing ureteral stone.  Urinalysis with microscopy shows no evidence of significant urinary casts.  Plan: monitor strict I's & O's and daily weights. Attempt to avoid nephrotoxic agents. Refrain from NSAIDs. Repeat CMP in the morning. Add-on random urine sodium and random urine creatinine.  Check CPK level, as above.  Normal saline, as above.  Further evaluation and management of underlying bilateral psoas inflammation along with precipitating L4-L5 osteomyelitis/discitis/epidural abscess, as further outlined above.                   #) Diarrhea:  3 to 4 days of diarrhea with urgency of 3-4 episodes of loose, watery, nonbloody stool over that timeframe.  Not associate with any abdominal discomfort, and CT abdomen/pelvis shows no evidence of colitis nor any evidence of bowel obstruction, perforation.  Differential includes infectious diarrhea, and GI panel by PCR was ordered in the ED, with result currently pending.  Differential also includes the possibility of rebound enhancement of gut motility as a consequence of the absence of opioid use during recent incarceration.   Plan: Follow for result of GI panel by PCR.  IV fluids, as above.  Repeat CMP, CBC in the morning.  Repeat serum magnesium level in the morning.                    #) Acute microcytic anemia: Patient presents with a new diagnosis of anemia, with presenting hemoglobin 8.1 compared to baseline range of 12.4-14.  Given the 2-year gap in hemoglobin data points, determination of the chronicity of her anemia is complicated, although I suspect at least a subacute timeframe, given the microcytic finding of her anemia also noted to be hypochromic.  No evidence of active blood loss at this time.   Will further assess for iron deficiency anemia versus anemia of chronic disease via iron studies, will expanding diagnostic evaluation of her anemia as further detailed below.  Appears hemodynamically stable.  Not on blood thinners as an outpatient.  As noted above, MRI of the thoracic/lumbar spine showed no evidence of retroperitoneal hematoma.  Potential contributing factors, including presenting acute kidney injury.  Hemolytic anemia appears less likely at this time, given the absence of elevation in total bilirubin.    Plan: Repeat CBC in the morning.  Type and screen ordered.  Add on iron studies, B12 level, folic acid level, PTT, INR, reticulocyte count.  Check urinary drug screen.  Further evaluation management of presenting AKI, as above.                       #) Generalized weakness: 3 to 4-day duration of generalized weakness, in the absence of any evidence of acute focal neurologic deficits, including no evidence of acute focal weakness to suggest acute CVA.  With likely contributions from  her presenting L4-L5 osteomyelitis/discitis/epidural abscess along with clinical evidence of dehydration in the setting of recent development of diarrhea, with suspected additional contribution from the diagnosis of anemia.  No additional sources of underlying infection identified at this time, including urinalysis that is inconsistent with UTI, while CTA chest shows no evidence of acute cardiopulmonary process, including no evidence of infiltrate to suggest pneumonia.  Additionally, COVID, influenza, RSV PCR were all negative.  Will further eval for any additional contributions from endocrine/metabolic sources, as detailed below.   Plan: work-up and management of presenting L4-L5 osteomyelitis, discitis, epidural abscess, as above.  New diagnosis of anemia as well as acute kidney injury, as above.  IV fluids, as above.  Precautions ordered.  PT/OT consults ordered for the morning.  Check  CPK level, B12 level, TSH.  Repeat CBC, CMP in the morning.                    #) Type 2 Diabetes Mellitus: documented history of such. Home insulin regimen: None. Home oral hypoglycemic agents: Metformin. presenting blood sugar: 119. Most recent A1c noted to be 7.0% when checked in March 2023.  In the setting of current n.p.o. status leading up to evaluation by neurosurgery later today, will pursue Accu-Cheks/SSI on a every 6 hour basis.   Plan: Every 6 hour Accu-Cheks with associated low-dose sliding scale insulin.  Add on hemoglobin A1c level.  Hold home metformin for now.    DVT prophylaxis: SCD's   Code Status: Full code Family Communication: none Disposition Plan: Per Rounding Team Consults called: EDP spoke with Verlin Dike of neurosurgery, who conveyed that NS will formally consult and see pt in the AM, requesting that we hold off on abx in the meantime.  ;  Admission status: Inpatient     I SPENT GREATER THAN 75  MINUTES IN CLINICAL CARE TIME/MEDICAL DECISION-MAKING IN COMPLETING THIS ADMISSION.      Chaney Born Eleanore Junio DO [facility]  From 7PM - 7AM   11/11/2023, 2:02 AM

## 2023-11-11 NOTE — Progress Notes (Signed)
 Patient ID: Krystal [name], female   DOB: [birth year], 38 y.o.   MRN: [id number] Notes read, agree with plan of care. No surgery planned for now, we will sign off but call if we can be of any assistance.

## 2023-11-11 NOTE — Evaluation (Signed)
 Physical Therapy Evaluation Patient Details Name: Krystal [name] MRN: [id number] DOB: 09/15/1985 Today's Date: 11/11/2023  History of Present Illness  Patient is a 38 yo female presenting with diarrhea, generalized weakness and low back pain on 11/10/23. 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. PMH includes: DM2  Clinical Impression  Pt admitted with above diagnosis. PTA pt incarcerated, independent mobility/ADLs. Pt currently with functional limitations due to the deficits listed below (see PT Problem List). On eval, pt supervision supine to sit, CGA transfers, CGA amb 175' without AD, and min assist sit to supine. Mildly unsteady but no LOB. HR up to 135. SpO2 stable on RA. Pt will benefit from acute skilled PT to increase their independence and safety with mobility to allow discharge. PT to follow acutely. No follow up services indicated.         If plan is discharge home, recommend the following:     Can travel by private vehicle        Equipment Recommendations None recommended by PT  Recommendations for Other Services       Functional Status Assessment Patient has had a recent decline in their functional status and demonstrates the ability to make significant improvements in function in a reasonable and predictable amount of time.     Precautions / Restrictions Precautions Precautions: None Recall of Precautions/Restrictions: Intact      Mobility  Bed Mobility Overal bed mobility: Needs Assistance Bed Mobility: Supine to Sit, Sit to Supine     Supine to sit: Supervision, Used rails, HOB elevated Sit to supine: Min assist, Used rails, HOB elevated   General bed mobility comments: assist with BLE back to bed    Transfers Overall transfer level: Needs assistance Equipment used: None Transfers: Sit to/from Stand Sit to Stand: Contact guard assist           General transfer comment: increased time to power up     Ambulation/Gait Ambulation/Gait assistance: Contact guard assist Gait Distance (Feet): 175 Feet Assistive device: None Gait Pattern/deviations: Step-through pattern, Antalgic, Decreased stride length, Wide base of support Gait velocity: decreased Gait velocity interpretation: 1.31 - 2.62 ft/sec, indicative of limited community ambulator   General Gait Details: HR 135. SpO2 98% on RA. Pt with c/o feeling SOB. Educated on pursed lip breathing.  Stairs            Wheelchair Mobility     Tilt Bed    Modified Rankin (Stroke Patients Only)       Balance Overall balance assessment: Mild deficits observed, not formally tested                                           Pertinent Vitals/Pain Pain Assessment Pain Assessment: Faces Faces Pain Scale: Hurts even more Pain Location: low back Pain Descriptors / Indicators: Grimacing, Guarding, Discomfort Pain Intervention(s): Monitored during session, Limited activity within patient's tolerance, Patient requesting pain meds-RN notified    Home Living Family/patient expects to be discharged to:: [facility]                        Prior Function Prior Level of Function : Independent/Modified Independent                     Extremity/Trunk Assessment   Upper Extremity Assessment Upper Extremity Assessment: Generalized weakness  Lower Extremity Assessment Lower Extremity Assessment: Generalized weakness    Cervical / Trunk Assessment Cervical / Trunk Assessment: Other exceptions Cervical / Trunk Exceptions: discitis, lumbar osteomyelitis  Communication   Communication Communication: No apparent difficulties    Cognition Arousal: Alert Behavior During Therapy: Flat affect   PT - Cognitive impairments: No apparent impairments                         Following commands: Intact       Cueing       General Comments      Exercises     Assessment/Plan     PT Assessment Patient needs continued PT services  PT Problem List Decreased strength;Pain;Decreased mobility;Decreased activity tolerance       PT Treatment Interventions Gait training;Functional mobility training;Patient/family education;Therapeutic activities;Therapeutic exercise    PT Goals (Current goals can be found in the Care Plan section)  Acute Rehab PT Goals Patient Stated Goal: decrease pain PT Goal Formulation: With patient Time For Goal Achievement: 11/25/23 Potential to Achieve Goals: Good    Frequency Min 1X/week     Co-evaluation               AM-PAC PT "6 Clicks" Mobility  Outcome Measure Help needed turning from your back to your side while in a flat bed without using bedrails?: None Help needed moving from lying on your back to sitting on the side of a flat bed without using bedrails?: A Little Help needed moving to and from a bed to a chair (including a wheelchair)?: A Little Help needed standing up from a chair using your arms (e.g., wheelchair or bedside chair)?: A Little Help needed to walk in hospital room?: A Little Help needed climbing 3-5 steps with a railing? : A Little 6 Click Score: 19    End of Session Equipment Utilized During Treatment: Gait belt Activity Tolerance: Patient tolerated treatment well Patient left: in bed;with call bell/phone within reach;Other (comment) (police officers present in room) Nurse Communication: Patient requests pain meds;Mobility status PT Visit Diagnosis: Difficulty in walking, not elsewhere classified (R26.2);Pain    Time: 9811-9147 PT Time Calculation (min) (ACUTE ONLY): 20 min   Charges:   PT Evaluation $PT Eval Low Complexity: 1 Low   PT General Charges $$ ACUTE PT VISIT: 1 Visit         Ferd Glassing., PT  Office # [phone]   Ilda Foil 11/11/2023, 2:39 PM

## 2023-11-11 NOTE — Progress Notes (Addendum)
 PROGRESS NOTE    Krystal [name]  WUJ:[id number] DOB: 1985/09/19 DOA: 11/10/2023 PCP: Lorayne Bender, MD   Brief Narrative:  Krystal [name] is a 38 y.o. female with medical history significant for type 2 diabetes mellitus, opioid abuse(denies injections) who [facility] from [facility] with worsening generalized weakness of 3-4 days with intermittent low back pain for 6 months. Imaging here confirms L4/5 discitis/osteomyelitis with notable epidural abscess with concurrent severe spinal stenosis. Hospitalist called to admit as neurosurgery was consulted for further evaluation.  Assessment & Plan:   Principal Problem:   Osteomyelitis of lumbar spine (HCC) Active Problems:   Diarrhea   DM2 (diabetes mellitus, type 2) (HCC)   Abscess in epidural space of lumbar spine   Low back pain   Hydroureteronephrosis   AKI (acute kidney injury) (HCC)   Generalized weakness   Microcytic anemia   Epidural abscess  Osteomyelitis/discitis L3-S1, POA Left greater than right psoas versus iliacus myositis Does not meet sepsis criteria -Per imaging, area consistent with patient's area of complaint for pain -Neurosurgery following, no acute intervention required at this time -recommend IR for sample/culture - will order now as this has not yet been done -Hold antibiotics until cultures can be performed per neurosurgery -Pain currently well-controlled on current regimen including IV dilaudid and oxycodone -PT OT to follow once pain is more appropriately controlled -ESR 135, CK11  Nonspecified diarrheal illness, questionably resolved GI PCR panel pending, contact precautions ongoing  No episodes of diarrhea per patient since admission.  AKI, POA Mild to moderate left hydroureter hydronephrosis Without history of CKD Likely multifactorial in the setting of infection Urinary obstruction likely secondary to myositis and swelling of the iliopsoas Patient denies any symptoms of dysuria hematuria or  difficulty voiding Bladder scan 3 times daily x 24 hours to ensure no obstructive uropathy distal  Anemia, microcytic, iron deficient Elevated D dimer  -Continue supplement iron as appropriate -No signs or symptoms of bleeding at this time, no complaints of bleeding per patient -No indication for transfusion at this time -Distant history 2 years ago shows hemoglobin within normal limits -Iron panel notable for low iron/low saturation percent, reticulocyte count low normal   DVT prophylaxis: SCDs Start: 11/11/23 0158 Code Status:   Code Status: Full Code Family Communication: None present  Status is: Inpatient  Dispo: The patient is from: [place]              Anticipated d/c is to: Same              Anticipated d/c date is: 48 to 72 hours              Patient currently not medically stable for discharge  Consultants:  Neurosurgery  Procedures:  None  Antimicrobials:  Holding per neurosurgery recommendations until sample can be obtained  Subjective: No acute issues or events overnight, patient reports pain ongoing but less intense and frequent than previous.  Otherwise denies nausea vomiting diarrhea constipation headache fevers chills or chest pain  Objective: Vitals:   11/11/23 0228 11/11/23 0256 11/11/23 0319 11/11/23 0902  BP:  131/84  127/86  Pulse:  (!) 120  98  Resp:  16    Temp: 98.4 F (36.9 C)   98.5 F (36.9 C)  TempSrc: Oral   Oral  SpO2:  98%  100%  Weight:   102.5 kg   Height:   5\' 4"  (1.626 m)     Intake/Output Summary (Last 24 hours) at 11/11/2023 1319 Last data filed  at 11/11/2023 0600 Gross per 24 hour  Intake 1000 ml  Output 400 ml  Net 600 ml   Filed Weights   11/11/23 0319  Weight: 102.5 kg    Examination:   General:  Pleasantly resting in bed, No acute distress. HEENT:  Normocephalic atraumatic.  Sclerae nonicteric, noninjected.  Extraocular movements intact bilaterally. Neck:  Without mass or deformity.  Trachea is midline. Lungs:   Clear to auscultate bilaterally without rhonchi, wheeze, or rales. Heart:  Regular rate and rhythm.  Without murmurs, rubs, or gallops. Abdomen:  Soft, nontender, nondistended.  Without guarding or rebound. Extremities: Without cyanosis, clubbing, edema, or obvious deformity. Skin:  Warm and dry, no erythema.   Data Reviewed: I have personally reviewed following labs and imaging studies  CBC: Recent Labs  Lab 11/10/23 1632 11/11/23 0627  WBC 8.4 7.6  NEUTROABS 6.4 5.1  HGB 8.1* 7.5*  HCT 27.5* 25.2*  MCV 74.5* 74.8*  PLT 574* 440*   Basic Metabolic Panel: Recent Labs  Lab 11/10/23 1632 11/11/23 0627  NA 135 135  K 3.8 3.7  CL 101 104  CO2 21* 21*  GLUCOSE 119* 94  BUN 11 8  CREATININE 0.91 0.99  CALCIUM 9.1 8.6*  MG 2.0 1.9   GFR: Estimated Creatinine Clearance: 92.4 mL/min (by C-G formula based on SCr of 0.99 mg/dL). Liver Function Tests: Recent Labs  Lab 11/10/23 1632 11/11/23 0627  AST 9* 8*  ALT 9 8  ALKPHOS 73 65  BILITOT 0.2 0.5  PROT 8.3* 7.1  ALBUMIN 2.6* 2.3*   Recent Labs  Lab 11/10/23 1632  LIPASE 22   HbA1C: Recent Labs    11/11/23 0627  HGBA1C 6.4*   CBG: Recent Labs  Lab 11/11/23 0631 11/11/23 1205  GLUCAP 79 140*   Anemia Panel: Recent Labs    11/11/23 0627  VITAMINB12 188  FOLATE 9.6  FERRITIN 247  TIBC 189*  IRON 14*  RETICCTPCT 1.4   Sepsis Labs: Recent Labs  Lab 11/10/23 2353 11/11/23 0104  LATICACIDVEN 0.8 0.4*    Recent Results (from the past 240 hours)  Resp panel by RT-PCR (RSV, Flu A&B, Covid) Anterior Nasal Swab     Status: None   Collection Time: 11/10/23  4:15 PM   Specimen: Anterior Nasal Swab  Result Value Ref Range Status   SARS Coronavirus 2 by RT PCR NEGATIVE NEGATIVE Final   Influenza A by PCR NEGATIVE NEGATIVE Final   Influenza B by PCR NEGATIVE NEGATIVE Final    Comment: (NOTE) The Xpert Xpress SARS-CoV-2/FLU/RSV plus assay is intended as an aid in the diagnosis of influenza from  Nasopharyngeal swab specimens and should not be used as a sole basis for treatment. Nasal washings and aspirates are unacceptable for Xpert Xpress SARS-CoV-2/FLU/RSV testing.  Fact Sheet for Patients: BloggerCourse.com  Fact Sheet for Healthcare Providers: SeriousBroker.it  This test is not yet approved or cleared by the [place] FDA and has been authorized for detection and/or diagnosis of SARS-CoV-2 by FDA under an Emergency Use Authorization (EUA). This EUA will remain in effect (meaning this test can be used) for the duration of the COVID-19 declaration under Section 564(b)(1) of the Act, 21 U.S.C. section 360bbb-3(b)(1), unless the authorization is terminated or revoked.     Resp Syncytial Virus by PCR NEGATIVE NEGATIVE Final    Comment: (NOTE) Fact Sheet for Patients: BloggerCourse.com  Fact Sheet for Healthcare Providers: SeriousBroker.it  This test is not yet approved or cleared by the [place] and  has been authorized for detection and/or diagnosis of SARS-CoV-2 by FDA under an Emergency Use Authorization (EUA). This EUA will remain in effect (meaning this test can be used) for the duration of the COVID-19 declaration under Section 564(b)(1) of the Act, 21 U.S.C. section 360bbb-3(b)(1), unless the authorization is terminated or revoked.  Performed at [facility], [address], [place], [place] [address]   Blood culture (routine x 2)     Status: None (Preliminary result)   Collection Time: 11/10/23 11:03 PM   Specimen: BLOOD  Result Value Ref Range Status   Specimen Description BLOOD LEFT ANTECUBITAL  Final   Special Requests   Final    BOTTLES DRAWN AEROBIC AND ANAEROBIC Blood Culture adequate volume   Culture   Final    NO GROWTH < 12 HOURS Performed at [facility], [address], [place], [place] [address]    Report Status PENDING   Incomplete  MRSA Next Gen by PCR, Nasal     Status: Abnormal   Collection Time: 11/11/23  3:40 AM   Specimen: Nasal Mucosa; Nasal Swab  Result Value Ref Range Status   MRSA by PCR Next Gen DETECTED (A) NOT DETECTED Final    Comment: RESULT CALLED TO, READ BACK BY AND VERIFIED WITH: RN Lydia on [phone] @0925  by SM (NOTE) The GeneXpert MRSA Assay (FDA approved for NASAL specimens only), is one component of a comprehensive MRSA colonization surveillance program. It is not intended to diagnose MRSA infection nor to guide or monitor treatment for MRSA infections. Test performance is not FDA approved in patients less than 32 years old. Performed at [facility], [address] [place], [place] [address]          Radiology Studies: MR THORACIC SPINE W WO CONTRAST Result Date: 11/11/2023 CLINICAL DATA:  Mid back pain with infection suspected EXAM: MRI THORACIC AND LUMBAR SPINE WITHOUT AND WITH CONTRAST TECHNIQUE: Multiplanar and multiecho pulse sequences of the thoracic and lumbar spine were obtained without and with intravenous contrast. CONTRAST:  10mL GADAVIST GADOBUTROL 1 MMOL/ML IV SOLN COMPARISON:  None Available. FINDINGS: MRI THORACIC SPINE FINDINGS Alignment:  Physiologic. Vertebrae: No fracture, evidence of discitis, or bone lesion. Cord:  Normal signal and morphology. Paraspinal and other soft tissues: Negative. Disc levels: T6-7: Small central disc protrusion effaces the ventral thecal sac and contacts the spinal cord. T8-9: Small central disc protrusion contacts the ventral spinal cord. The other thoracic disc levels are unremarkable. No abnormal contrast enhancement. MRI LUMBAR SPINE FINDINGS Segmentation:  Standard. Alignment:  Physiologic. Vertebrae: Diffuse bone marrow edema at L4 and L5 with associated contrast enhancement and abnormal signal within the disc space. There is ventral epidural contrast enhancement at both levels that narrows the thecal sac. Conus medullaris:  Extends to the L1 level and appears normal. Paraspinal and other soft tissues: Left hydroureteronephrosis. Bilateral psoas muscle edema and abnormal contrast enhancement without focal fluid collection. Disc levels: L1-L2: Normal disc space and facet joints. No spinal canal stenosis. No neural foraminal stenosis. L2-L3: Normal disc space and facet joints. No spinal canal stenosis. No neural foraminal stenosis. L3-L4: Small disc bulge and mild facet hypertrophy. No spinal canal stenosis. No neural foraminal stenosis. L4-L5: Disc abnormalities as above. Severe spinal canal stenosis. Moderate right and severe left neural foraminal stenosis. L5-S1: Normal disc space and facet joints. No spinal canal stenosis. No neural foraminal stenosis. Visualized sacrum: Normal. IMPRESSION: 1. L4-L5 discitis-osteomyelitis with ventral epidural phlegmon/abscess resulting in severe spinal  canal stenosis and moderate right and severe left neural foraminal stenosis. 2. Bilateral psoas muscle edema and abnormal contrast enhancement without focal fluid collection, consistent with myositis. 3. Left hydroureteronephrosis. 4. Small central disc protrusions at T6-7 and T8-9 contact the ventral spinal cord. Electronically Signed   By: Deatra Robinson M.D.   On: 11/11/2023 00:38   MR Lumbar Spine W Wo Contrast Result Date: 11/11/2023 CLINICAL DATA:  Mid back pain with infection suspected EXAM: MRI THORACIC AND LUMBAR SPINE WITHOUT AND WITH CONTRAST TECHNIQUE: Multiplanar and multiecho pulse sequences of the thoracic and lumbar spine were obtained without and with intravenous contrast. CONTRAST:  10mL GADAVIST GADOBUTROL 1 MMOL/ML IV SOLN COMPARISON:  None Available. FINDINGS: MRI THORACIC SPINE FINDINGS Alignment:  Physiologic. Vertebrae: No fracture, evidence of discitis, or bone lesion. Cord:  Normal signal and morphology. Paraspinal and other soft tissues: Negative. Disc levels: T6-7: Small central disc protrusion effaces the ventral thecal  sac and contacts the spinal cord. T8-9: Small central disc protrusion contacts the ventral spinal cord. The other thoracic disc levels are unremarkable. No abnormal contrast enhancement. MRI LUMBAR SPINE FINDINGS Segmentation:  Standard. Alignment:  Physiologic. Vertebrae: Diffuse bone marrow edema at L4 and L5 with associated contrast enhancement and abnormal signal within the disc space. There is ventral epidural contrast enhancement at both levels that narrows the thecal sac. Conus medullaris: Extends to the L1 level and appears normal. Paraspinal and other soft tissues: Left hydroureteronephrosis. Bilateral psoas muscle edema and abnormal contrast enhancement without focal fluid collection. Disc levels: L1-L2: Normal disc space and facet joints. No spinal canal stenosis. No neural foraminal stenosis. L2-L3: Normal disc space and facet joints. No spinal canal stenosis. No neural foraminal stenosis. L3-L4: Small disc bulge and mild facet hypertrophy. No spinal canal stenosis. No neural foraminal stenosis. L4-L5: Disc abnormalities as above. Severe spinal canal stenosis. Moderate right and severe left neural foraminal stenosis. L5-S1: Normal disc space and facet joints. No spinal canal stenosis. No neural foraminal stenosis. Visualized sacrum: Normal. IMPRESSION: 1. L4-L5 discitis-osteomyelitis with ventral epidural phlegmon/abscess resulting in severe spinal canal stenosis and moderate right and severe left neural foraminal stenosis. 2. Bilateral psoas muscle edema and abnormal contrast enhancement without focal fluid collection, consistent with myositis. 3. Left hydroureteronephrosis. 4. Small central disc protrusions at T6-7 and T8-9 contact the ventral spinal cord. Electronically Signed   By: Deatra Robinson M.D.   On: 11/11/2023 00:38   CT Angio Chest PE W and/or Wo Contrast Result Date: 11/10/2023 CLINICAL DATA:  Right-sided flank pain shortness of breath positive D-dimer EXAM: CT ANGIOGRAPHY CHEST CT ABDOMEN  AND PELVIS WITH CONTRAST TECHNIQUE: Multidetector CT imaging of the chest was performed using the standard protocol during bolus administration of intravenous contrast. Multiplanar CT image reconstructions and MIPs were obtained to evaluate the vascular anatomy. Multidetector CT imaging of the abdomen and pelvis was performed using the standard protocol during bolus administration of intravenous contrast. RADIATION DOSE REDUCTION: This exam was performed according to the departmental dose-optimization program which includes automated exposure control, adjustment of the mA and/or kV according to patient size and/or use of iterative reconstruction technique. CONTRAST:  75mL OMNIPAQUE IOHEXOL 350 MG/ML SOLN COMPARISON:  CT 10/20/2021 FINDINGS: CTA CHEST FINDINGS Cardiovascular: Slightly suboptimal opacification of the pulmonary arteries to the segmental level. No evidence of pulmonary embolism. Normal heart size. No pericardial effusion. Nonaneurysmal aorta. Mediastinum/Nodes: Patent trachea. Slightly enlarged thyroid. Scattered thyroid nodules, largest seen in the inferior left lobe measuring 14 mm, no specific imaging follow-up is  recommended. No suspicious lymph nodes. Esophagus within normal limits Lungs/Pleura: Minimal atelectasis at the right middle lobe. No acute airspace disease, pleural effusion or pneumothorax Musculoskeletal: No acute osseous abnormality Review of the MIP images confirms the above findings. CT ABDOMEN and PELVIS FINDINGS Hepatobiliary: Liver is enlarged with craniocaudal measurement of 23 cm. Diffuse decreased hepatic density consistent with steatosis. No calcified gallstone or biliary dilatation Pancreas: Unremarkable. No pancreatic ductal dilatation or surrounding inflammatory changes. Spleen: Normal in size without focal abnormality. Adrenals/Urinary Tract: Adrenal glands are normal. Diffuse hypoenhancement of the left kidney. Mild to moderate left hydronephrosis and proximal hydroureter.  No obstructing stone. The bladder is unremarkable Stomach/Bowel: The stomach is nonenlarged. No dilated small bowel. No acute bowel wall thickening. Negative appendix Vascular/Lymphatic: Nonaneurysmal aorta.  No suspicious lymph nodes. Reproductive: Uterus unremarkable. Complex left adnexal cyst measuring 4.4 by 3.7 cm. Other: Negative for free air. Musculoskeletal: Heterogenous left greater than right psoas muscle enlargement which appears slightly hyperdense. On the left, this extends to the left iliacus muscle where there is also stranding. No active extravasation. Abnormal prevertebral soft tissue thickening extending from about L3-L4 to the L5-S1 level. There is endplate destructive change at L4-L5 and heterogeneous sclerosis at the L4, L5 and S1 vertebral bodies which is new compared to the prior CT. Review of the MIP images confirms the above findings. IMPRESSION: 1. Negative for acute pulmonary embolus. 2. Diffuse hypoenhancement of the left kidney with mild to moderate left hydronephrosis and proximal hydroureter. No obstructing stone. Source of obstruction appears to be heterogenous soft tissue thickening in the left paraspinal region subsequently discussed. 3. Heterogenous left greater than right psoas muscle enlargement which appears slightly hyperdense. On the left, this extends to the left iliacus muscle where there is also stranding. The overall appearance is suspect for retroperitoneal hematoma. There is no active extravasation on this exam. 4. Abnormal prevertebral soft tissue thickening extending from about L3-L4 to the L5-S1 level with endplate destructive change at L4-L5 and heterogeneous sclerosis at the L4, L5 and S1 vertebral bodies. Findings are highly suspicious for discitis/osteomyelitis. MRI of the lumbar spine with and without contrast is recommended for further evaluation. 5. Hepatomegaly with hepatic steatosis. 6. Complex left adnexal cyst measuring up to 4.4 cm. Correlation with pelvic  ultrasound is suggested. Electronically Signed   By: Jasmine Pang M.D.   On: 11/10/2023 22:56   CT ABDOMEN PELVIS W CONTRAST Result Date: 11/10/2023 CLINICAL DATA:  Right-sided flank pain shortness of breath positive D-dimer EXAM: CT ANGIOGRAPHY CHEST CT ABDOMEN AND PELVIS WITH CONTRAST TECHNIQUE: Multidetector CT imaging of the chest was performed using the standard protocol during bolus administration of intravenous contrast. Multiplanar CT image reconstructions and MIPs were obtained to evaluate the vascular anatomy. Multidetector CT imaging of the abdomen and pelvis was performed using the standard protocol during bolus administration of intravenous contrast. RADIATION DOSE REDUCTION: This exam was performed according to the departmental dose-optimization program which includes automated exposure control, adjustment of the mA and/or kV according to patient size and/or use of iterative reconstruction technique. CONTRAST:  75mL OMNIPAQUE IOHEXOL 350 MG/ML SOLN COMPARISON:  CT 10/20/2021 FINDINGS: CTA CHEST FINDINGS Cardiovascular: Slightly suboptimal opacification of the pulmonary arteries to the segmental level. No evidence of pulmonary embolism. Normal heart size. No pericardial effusion. Nonaneurysmal aorta. Mediastinum/Nodes: Patent trachea. Slightly enlarged thyroid. Scattered thyroid nodules, largest seen in the inferior left lobe measuring 14 mm, no specific imaging follow-up is recommended. No suspicious lymph nodes. Esophagus within normal limits Lungs/Pleura: Minimal atelectasis  at the right middle lobe. No acute airspace disease, pleural effusion or pneumothorax Musculoskeletal: No acute osseous abnormality Review of the MIP images confirms the above findings. CT ABDOMEN and PELVIS FINDINGS Hepatobiliary: Liver is enlarged with craniocaudal measurement of 23 cm. Diffuse decreased hepatic density consistent with steatosis. No calcified gallstone or biliary dilatation Pancreas: Unremarkable. No  pancreatic ductal dilatation or surrounding inflammatory changes. Spleen: Normal in size without focal abnormality. Adrenals/Urinary Tract: Adrenal glands are normal. Diffuse hypoenhancement of the left kidney. Mild to moderate left hydronephrosis and proximal hydroureter. No obstructing stone. The bladder is unremarkable Stomach/Bowel: The stomach is nonenlarged. No dilated small bowel. No acute bowel wall thickening. Negative appendix Vascular/Lymphatic: Nonaneurysmal aorta.  No suspicious lymph nodes. Reproductive: Uterus unremarkable. Complex left adnexal cyst measuring 4.4 by 3.7 cm. Other: Negative for free air. Musculoskeletal: Heterogenous left greater than right psoas muscle enlargement which appears slightly hyperdense. On the left, this extends to the left iliacus muscle where there is also stranding. No active extravasation. Abnormal prevertebral soft tissue thickening extending from about L3-L4 to the L5-S1 level. There is endplate destructive change at L4-L5 and heterogeneous sclerosis at the L4, L5 and S1 vertebral bodies which is new compared to the prior CT. Review of the MIP images confirms the above findings. IMPRESSION: 1. Negative for acute pulmonary embolus. 2. Diffuse hypoenhancement of the left kidney with mild to moderate left hydronephrosis and proximal hydroureter. No obstructing stone. Source of obstruction appears to be heterogenous soft tissue thickening in the left paraspinal region subsequently discussed. 3. Heterogenous left greater than right psoas muscle enlargement which appears slightly hyperdense. On the left, this extends to the left iliacus muscle where there is also stranding. The overall appearance is suspect for retroperitoneal hematoma. There is no active extravasation on this exam. 4. Abnormal prevertebral soft tissue thickening extending from about L3-L4 to the L5-S1 level with endplate destructive change at L4-L5 and heterogeneous sclerosis at the L4, L5 and S1  vertebral bodies. Findings are highly suspicious for discitis/osteomyelitis. MRI of the lumbar spine with and without contrast is recommended for further evaluation. 5. Hepatomegaly with hepatic steatosis. 6. Complex left adnexal cyst measuring up to 4.4 cm. Correlation with pelvic ultrasound is suggested. Electronically Signed   By: Jasmine Pang M.D.   On: 11/10/2023 22:56   DG Chest 2 View Result Date: 11/10/2023 CLINICAL DATA:  Shortness of breath for 2 weeks. EXAM: CHEST - 2 VIEW COMPARISON:  10/20/2021 FINDINGS: The cardiomediastinal contours are normal. The lungs are clear. Pulmonary vasculature is normal. No consolidation, pleural effusion, or pneumothorax. No acute osseous abnormalities are seen. IMPRESSION: Negative radiographs of the chest. Electronically Signed   By: Narda Rutherford M.D.   On: 11/10/2023 17:24    Scheduled Meds:  Chlorhexidine Gluconate Cloth  6 each Topical Daily   insulin aspart  0-6 Units Subcutaneous Q6H   mupirocin ointment  1 Application Nasal BID   Continuous Infusions:  sodium chloride 100 mL/hr at 11/11/23 1306     LOS: 0 days   Time spent:  Azucena Fallen, DO [facility]  If 7PM-7AM, please contact night-coverage [email or web address]  11/11/2023, 1:19 PM

## 2023-11-11 NOTE — Plan of Care (Signed)

## 2023-11-11 NOTE — Progress Notes (Addendum)
 Please notify Joni Reining, Director of nursing, of plan of care. Patient will need transfer to another facility if requiring IV abx. Number to reach her is [phone].

## 2023-11-11 NOTE — Consult Note (Signed)
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 osseous abnormality Review of the MIP images confirms the above findings. CT ABDOMEN and PELVIS FINDINGS Hepatobiliary: Liver is enlarged with craniocaudal measurement of 23 cm. Diffuse decreased hepatic density consistent with steatosis. No calcified gallstone or biliary dilatation Pancreas: Unremarkable. No pancreatic ductal dilatation or surrounding inflammatory changes. Spleen:  Normal in size without focal abnormality. Adrenals/Urinary Tract: Adrenal glands are normal. Diffuse hypoenhancement of the left kidney. Mild to moderate left hydronephrosis and proximal hydroureter. No obstructing stone. The bladder is unremarkable Stomach/Bowel: The stomach is nonenlarged. No dilated small bowel. No acute bowel wall thickening. Negative appendix Vascular/Lymphatic: Nonaneurysmal aorta.  No suspicious lymph nodes. Reproductive: Uterus unremarkable. Complex left adnexal cyst measuring 4.4 by 3.7 cm. Other: Negative for free air. Musculoskeletal: Heterogenous left greater than right psoas muscle enlargement which appears slightly hyperdense. On the left, this extends to the left iliacus muscle where there  is also stranding. No active extravasation. Abnormal prevertebral soft tissue thickening extending from about L3-L4 to the L5-S1 level. There is endplate destructive change at L4-L5 and heterogeneous sclerosis at the L4, L5 and S1 vertebral bodies which is new compared to the prior CT. Review of the MIP images confirms the above findings. IMPRESSION: 1. Negative for acute pulmonary embolus. 2. Diffuse hypoenhancement of the left kidney with mild to moderate left hydronephrosis and proximal hydroureter. No obstructing stone. Source of obstruction appears to be heterogenous soft tissue thickening in the left paraspinal region subsequently discussed. 3. Heterogenous left greater than right psoas muscle enlargement which appears slightly hyperdense. On the left, this extends to the left iliacus muscle where there is also stranding. The overall appearance is suspect for retroperitoneal hematoma. There is no active extravasation on this exam. 4. Abnormal prevertebral soft tissue thickening extending from about L3-L4 to the L5-S1 level with endplate destructive change at L4-L5 and heterogeneous sclerosis at the L4, L5 and S1 vertebral bodies. Findings are highly suspicious for discitis/osteomyelitis. MRI  of the lumbar spine with and without contrast is recommended for further evaluation. 5. Hepatomegaly with hepatic steatosis. 6. Complex left adnexal cyst measuring up to 4.4 cm. Correlation with pelvic ultrasound is suggested. Electronically Signed   By: Jasmine Pang M.D.   On: 11/10/2023 22:56   CT ABDOMEN PELVIS W CONTRAST Result Date: 11/10/2023 CLINICAL DATA:  Right-sided flank pain shortness of breath positive D-dimer EXAM: CT ANGIOGRAPHY CHEST CT ABDOMEN AND PELVIS WITH CONTRAST TECHNIQUE: Multidetector CT imaging of the chest was performed using the standard protocol during bolus administration of intravenous contrast. Multiplanar CT image reconstructions and MIPs were obtained to evaluate the vascular anatomy. Multidetector CT imaging of the abdomen and pelvis was performed using the standard protocol during bolus administration of intravenous contrast. RADIATION DOSE REDUCTION: This exam was performed according to the departmental dose-optimization program which includes automated exposure control, adjustment of the mA and/or kV according to patient size and/or use of iterative reconstruction technique. CONTRAST:  75mL OMNIPAQUE IOHEXOL 350 MG/ML SOLN COMPARISON:  CT 10/20/2021 FINDINGS: CTA CHEST FINDINGS Cardiovascular: Slightly suboptimal opacification of the pulmonary arteries to the segmental level. No evidence of pulmonary embolism. Normal heart size. No pericardial effusion. Nonaneurysmal aorta. Mediastinum/Nodes: Patent trachea. Slightly enlarged thyroid. Scattered thyroid nodules, largest seen in the inferior left lobe measuring 14 mm, no specific imaging follow-up is recommended. No suspicious lymph nodes. Esophagus within normal limits Lungs/Pleura: Minimal atelectasis at the right middle lobe. No acute airspace disease, pleural effusion or pneumothorax Musculoskeletal: No acute osseous abnormality Review of the MIP images confirms the above findings. CT ABDOMEN and PELVIS FINDINGS  Hepatobiliary: Liver is enlarged with craniocaudal measurement of 23 cm. Diffuse decreased hepatic density consistent with steatosis. No calcified gallstone or biliary dilatation Pancreas: Unremarkable. No pancreatic ductal dilatation or surrounding inflammatory changes. Spleen: Normal in size without focal abnormality. Adrenals/Urinary Tract: Adrenal glands are normal. Diffuse hypoenhancement of the left kidney. Mild to moderate left hydronephrosis and proximal hydroureter. No obstructing stone. The bladder is unremarkable Stomach/Bowel: The stomach is nonenlarged. No dilated small bowel. No acute bowel wall thickening. Negative appendix Vascular/Lymphatic: Nonaneurysmal aorta.  No suspicious lymph nodes. Reproductive: Uterus unremarkable. Complex left adnexal cyst measuring 4.4 by 3.7 cm. Other: Negative for free air. Musculoskeletal: Heterogenous left greater than right psoas muscle enlargement which appears slightly hyperdense. On the left, this extends to the left iliacus muscle where there is also stranding. No active extravasation. Abnormal prevertebral soft tissue thickening extending  from about L3-L4 to the L5-S1 level. There is endplate destructive change at L4-L5 and heterogeneous sclerosis at the L4, L5 and S1 vertebral bodies which is new compared to the prior CT. Review of the MIP images confirms the above findings. IMPRESSION: 1. Negative for acute pulmonary embolus. 2. Diffuse hypoenhancement of the left kidney with mild to moderate left hydronephrosis and proximal hydroureter. No obstructing stone. Source of obstruction appears to be heterogenous soft tissue thickening in the left paraspinal region subsequently discussed. 3. Heterogenous left greater than right psoas muscle enlargement which appears slightly hyperdense. On the left, this extends to the left iliacus muscle where there is also stranding. The overall appearance is suspect for retroperitoneal hematoma. There is no active extravasation  on this exam. 4. Abnormal prevertebral soft tissue thickening extending from about L3-L4 to the L5-S1 level with endplate destructive change at L4-L5 and heterogeneous sclerosis at the L4, L5 and S1 vertebral bodies. Findings are highly suspicious for discitis/osteomyelitis. MRI of the lumbar spine with and without contrast is recommended for further evaluation. 5. Hepatomegaly with hepatic steatosis. 6. Complex left adnexal cyst measuring up to 4.4 cm. Correlation with pelvic ultrasound is suggested. Electronically Signed   By: Jasmine Pang M.D.   On: 11/10/2023 22:56   DG Chest 2 View Result Date: 11/10/2023 CLINICAL DATA:  Shortness of breath for 2 weeks. EXAM: CHEST - 2 VIEW COMPARISON:  10/20/2021 FINDINGS: The cardiomediastinal contours are normal. The lungs are clear. Pulmonary vasculature is normal. No consolidation, pleural effusion, or pneumothorax. No acute osseous abnormalities are seen. IMPRESSION: Negative radiographs of the chest. Electronically Signed   By: Narda Rutherford M.D.   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  - for now plan to have IR do disc aspirate. She appears clinically stable to hold off on abtx. - if blood cx +, then would start abtx based upon BCID. Anticipate to need daptomycin dosed at 8mg /kg/day -would get TTE for now  Diarrhea = agree with checking for cdifficile possibly GIP. Continue on enteric precautions.  I have personally spent 85 minutes involved in face-to-face and non-face-to-face activities for this patient on the day of the visit. Professional time spent includes the following activities: Preparing to see the patient (review of tests), Obtaining and/or reviewing separately obtained  history (admission/discharge record), Performing a medically appropriate examination and/or evaluation , Ordering medications/tests/procedures, referring and communicating with other health care professionals, Documenting clinical information in the EMR, Independently interpreting results (not separately reported), Communicating results to the patient/family/caregiver, Counseling and educating the patient/family/caregiver and Care coordination (not separately reported).

## 2023-11-12 ENCOUNTER — Inpatient Hospital Stay (HOSPITAL_COMMUNITY)

## 2023-11-12 DIAGNOSIS — G061 Intraspinal abscess and granuloma: Secondary | ICD-10-CM | POA: Diagnosis not present

## 2023-11-12 DIAGNOSIS — M519 Unspecified thoracic, thoracolumbar and lumbosacral intervertebral disc disorder: Secondary | ICD-10-CM | POA: Diagnosis not present

## 2023-11-12 DIAGNOSIS — G062 Extradural and subdural abscess, unspecified: Secondary | ICD-10-CM | POA: Diagnosis not present

## 2023-11-12 DIAGNOSIS — M4620 Osteomyelitis of vertebra, site unspecified: Principal | ICD-10-CM

## 2023-11-12 DIAGNOSIS — Z653 Problems related to other legal circumstances: Secondary | ICD-10-CM

## 2023-11-12 DIAGNOSIS — M4626 Osteomyelitis of vertebra, lumbar region: Secondary | ICD-10-CM | POA: Diagnosis not present

## 2023-11-12 LAB — CBC
HCT: 26.6 % — ABNORMAL LOW (ref 36.0–46.0)
Hemoglobin: 7.8 g/dL — ABNORMAL LOW (ref 12.0–15.0)
MCH: 22 pg — ABNORMAL LOW (ref 26.0–34.0)
MCHC: 29.3 g/dL — ABNORMAL LOW (ref 30.0–36.0)
MCV: 75.1 fL — ABNORMAL LOW (ref 80.0–100.0)
Platelets: 476 10*3/uL — ABNORMAL HIGH (ref 150–400)
RBC: 3.54 MIL/uL — ABNORMAL LOW (ref 3.87–5.11)
RDW: 14.6 % (ref 11.5–15.5)
WBC: 8.5 10*3/uL (ref 4.0–10.5)
nRBC: 0 % (ref 0.0–0.2)

## 2023-11-12 LAB — GLUCOSE, CAPILLARY
Glucose-Capillary: 167 mg/dL — ABNORMAL HIGH (ref 70–99)
Glucose-Capillary: 192 mg/dL — ABNORMAL HIGH (ref 70–99)
Glucose-Capillary: 198 mg/dL — ABNORMAL HIGH (ref 70–99)
Glucose-Capillary: 97 mg/dL (ref 70–99)

## 2023-11-12 LAB — BASIC METABOLIC PANEL WITH GFR
Anion gap: 10 (ref 5–15)
BUN: 14 mg/dL (ref 6–20)
CO2: 23 mmol/L (ref 22–32)
Calcium: 8.8 mg/dL — ABNORMAL LOW (ref 8.9–10.3)
Chloride: 101 mmol/L (ref 98–111)
Creatinine, Ser: 0.87 mg/dL (ref 0.44–1.00)
GFR, Estimated: >60 mL/min (ref >60)
Glucose, Bld: 170 mg/dL — ABNORMAL HIGH (ref 70–99)
Potassium: 3.9 mmol/L (ref 3.5–5.1)
Sodium: 134 mmol/L — ABNORMAL LOW (ref 135–145)

## 2023-11-12 MED ORDER — MIDAZOLAM HCL 2 MG/2ML IJ SOLN
INTRAMUSCULAR | Status: AC | PRN
  Administered 2023-11-12 (×3): 1 mg via INTRAVENOUS

## 2023-11-12 MED ORDER — LIDOCAINE HCL 1 % IJ SOLN
INTRAMUSCULAR | Status: AC
  Filled 2023-11-12: qty 20

## 2023-11-12 MED ORDER — HYDROMORPHONE HCL 1 MG/ML IJ SOLN
INTRAMUSCULAR | Status: AC | PRN
  Administered 2023-11-12: 1 mg via INTRAVENOUS

## 2023-11-12 MED ORDER — FENTANYL CITRATE (PF) 100 MCG/2ML IJ SOLN
INTRAMUSCULAR | Status: AC | PRN
  Administered 2023-11-12 (×3): 50 ug via INTRAVENOUS

## 2023-11-12 MED ORDER — HYDROMORPHONE HCL 1 MG/ML IJ SOLN
INTRAMUSCULAR | Status: AC
  Filled 2023-11-12: qty 1

## 2023-11-12 MED ORDER — FENTANYL CITRATE (PF) 100 MCG/2ML IJ SOLN
INTRAMUSCULAR | Status: AC
  Filled 2023-11-12: qty 4

## 2023-11-12 MED ORDER — LIDOCAINE HCL 1 % IJ SOLN
10.0000 mL | Freq: Once | INTRAMUSCULAR | Status: AC
Start: 2023-11-12 — End: 2023-11-12
  Administered 2023-11-12: 10 mL via INTRADERMAL

## 2023-11-12 MED ORDER — MIDAZOLAM HCL 2 MG/2ML IJ SOLN
INTRAMUSCULAR | Status: AC
  Filled 2023-11-12: qty 6

## 2023-11-12 MED ORDER — SODIUM CHLORIDE 0.9 % IV SOLN
8.0000 mg/kg | Freq: Every day | INTRAVENOUS | Status: DC
  Administered 2023-11-13: 800 mg via INTRAVENOUS
  Filled 2023-11-12 (×2): qty 16

## 2023-11-12 MED ORDER — SODIUM CHLORIDE 0.9 % IV SOLN
2.0000 g | INTRAVENOUS | Status: DC
  Administered 2023-11-12 – 2023-11-20 (×9): 2 g via INTRAVENOUS
  Filled 2023-11-12 (×9): qty 20

## 2023-11-12 NOTE — Consult Note (Signed)
 Chief Complaint: Patient was seen in consultation today for lumbar osteomyelitis.   at the request of Azucena Fallen MD.  Referring Physician(s): Azucena Fallen, MD  Supervising Physician: Dr. Alroy Dust  Patient Status: [facility] - In-pt  Full Code  History of Present Illness: Krystal [name] is a 38 y.o. female with history of type 2 diabetes mellitus, obesity, and polysubstance abuse. Patient presented to the hospital 11/11/23 with diarrhea, generalized weakness, and low back pain. Subsequent workup with MRI lumbar and thoracic spine listed below.  MRI lumbar spine impression   MRI thoracic spine impression    Neurosurgery was consulted and surgical intervention was not recommended. Neurosurgery recommended a biopsy. Patient is also being followed by infectious disease. Antibiotics are on hold until disc aspiration obtained. Blood cultures are pending. IR consulted to assist with a disc aspiration L4-L5. Today patient reports that she has been feeling fatigued recently. Patient endorses: shortness of breath with exertion, and back pain. Denies: chest pain, headaches, vomiting, diarrhea, abdominal pain, inability to walk/stand.      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   Substance abuse (HCC)     Past Surgical History:  Procedure Laterality Date   ANTERIOR CRUCIATE LIGAMENT REPAIR Left 07/23/2015   Procedure: RECONSTRUCTION ANTERIOR CRUCIATE LIGAMENT (ACL) WITH GRAFTLINK HAMSTRING GRAFT;  Surgeon: Sheral Apley, MD;  Location: [facility];  Service: Orthopedics;  Laterality: Left;   CESAREAN SECTION N/A 08/18/2013   Procedure: CESAREAN SECTION;  Surgeon: Reva Bores, MD;  Location: [facility];  Service: Obstetrics;   Laterality: N/A;   EXCISION BONE CYST     gum   KNEE ARTHROSCOPY WITH MEDIAL MENISECTOMY Left 07/23/2015   Procedure: LEFT KNEE ARTHROSCOPY WITH PARTIAL MEDIAL MENISCECTOMY ;  Surgeon: Sheral Apley, MD;  Location: [facility];  Service: Orthopedics;  Laterality: Left;   KNEE ARTHROSCOPY WITH MENISCAL REPAIR Left 07/23/2015   Procedure: LATERAL MENISCAL REPAIR;  Surgeon: Sheral Apley, MD;  Location: [facility];  Service: Orthopedics;  Laterality: Left;    Allergies: Patient has no known allergies.  Medications: Prior to Admission medications   Medication Sig Start Date End Date Taking? Authorizing Provider  acetaminophen (TYLENOL) 325 MG tablet Take 650 mg by mouth 2 (two) times daily.   Yes [provider]  busPIRone (BUSPAR) 30 MG tablet Take 30 mg by mouth 2 (two) times daily.   Yes [provider]  hydrOXYzine (ATARAX) 50 MG tablet Take 100 mg by mouth 2 (two) times daily.   Yes [provider]  insulin glargine (LANTUS) 100 UNIT/ML Solostar Pen Inject 17 Units into the skin at bedtime.   Yes [provider]  insulin regular (NOVOLIN R) 100 units/mL injection Inject 2-14 Units into the skin 3 (three) times daily before meals. Sliding scale: 0-150 = 0 units 151-200= 2 units 201-250= 4 units 251-300= 6 units 301-350= 8 units 351-400= 10 units [id number]= 12 units, and call provider   Yes [provider]  metFORMIN (GLUCOPHAGE-XR) 750 MG 24 hr tablet Take 750 mg by mouth daily with breakfast.   Yes [provider]  prazosin (MINIPRESS) 2 MG capsule Take 2 mg by mouth  at bedtime.   Yes [provider]  sertraline (ZOLOFT) 100 MG tablet Take 100 mg by mouth at bedtime.   Yes [provider]  topiramate (TOPAMAX) 50 MG tablet Take 50 mg by mouth 2 (two) times daily.   Yes [provider]     Family History  Problem Relation Age of Onset   Asthma Mother    Diabetes  Mother    Alcohol abuse Mother    Stroke Mother     Social History   Socioeconomic History   Marital status: Legally Separated    Spouse name: Not on file   Number of children: Not on file   Years of education: Not on file   Highest education level: Not on file  Occupational History   Not on file  Tobacco Use   Smoking status: Every Day    Current packs/day: 0.50    Average packs/day: 0.5 packs/day for 10.0 years (5.0 ttl pk-yrs)    Types: Cigarettes   Smokeless tobacco: Never  Vaping Use   Vaping status: Never Used  Substance and Sexual Activity   Alcohol use: No   Drug use: No    Comment: Hx percocet use, on subutex   Sexual activity: Yes    Birth control/protection: None  Other Topics Concern   Not on file  Social History Narrative   ** Merged History Encounter **       Social Drivers of Health   Financial Resource Strain: Not on file  Food Insecurity: No Food Insecurity (11/11/2023)   Hunger Vital Sign    Worried About Running Out of Food in the Last Year: Never true    Ran Out of Food in the Last Year: Never true  Transportation Needs: No Transportation Needs (11/11/2023)   PRAPARE - Administrator, Civil Service (Medical): No    Lack of Transportation (Non-Medical): No  Physical Activity: Not on file  Stress: Not on file  Social Connections: Not on file      Review of Systems: A 12 point ROS discussed and pertinent positives are indicated in the HPI above.  All other systems are negative.  Review of Systems  Constitutional:  Negative for fatigue.  Respiratory:  Positive for shortness of breath (with exertion).   Cardiovascular:  Negative for chest pain.  Gastrointestinal:  Negative for abdominal pain, nausea and vomiting.  Musculoskeletal:  Positive for back pain.  Psychiatric/Behavioral:  Negative for confusion.     Vital Signs: BP (!) 140/87 (BP Location: Right Arm)   Pulse 92   Temp 98.6 F (37 C) (Oral)   Resp 16   Ht 5\' 4"  (1.626  m)   Wt 212 lb 11.9 oz (96.5 kg)   SpO2 100%   BMI 36.52 kg/m     Physical Exam HENT:     Head: Normocephalic and atraumatic.     Mouth/Throat:     Mouth: Mucous membranes are dry.     Pharynx: Oropharynx is clear.  Cardiovascular:     Rate and Rhythm: Normal rate and regular rhythm.  Pulmonary:     Effort: Pulmonary effort is normal.     Breath sounds: Normal breath sounds.  Skin:    General: Skin is warm.  Neurological:     General: No focal deficit present.     Mental Status: She is alert and oriented to person, place, and time.  Psychiatric:        Mood and Affect: Mood normal.  Thought Content: Thought content normal.     Imaging: MR THORACIC SPINE W WO CONTRAST Result Date: 11/11/2023 CLINICAL DATA:  Mid back pain with infection suspected EXAM: MRI THORACIC AND LUMBAR SPINE WITHOUT AND WITH CONTRAST TECHNIQUE: Multiplanar and multiecho pulse sequences of the thoracic and lumbar spine were obtained without and with intravenous contrast. CONTRAST:  10mL GADAVIST GADOBUTROL 1 MMOL/ML IV SOLN COMPARISON:  None Available. FINDINGS: MRI THORACIC SPINE FINDINGS Alignment:  Physiologic. Vertebrae: No fracture, evidence of discitis, or bone lesion. Cord:  Normal signal and morphology. Paraspinal and other soft tissues: Negative. Disc levels: T6-7: Small central disc protrusion effaces the ventral thecal sac and contacts the spinal cord. T8-9: Small central disc protrusion contacts the ventral spinal cord. The other thoracic disc levels are unremarkable. No abnormal contrast enhancement. MRI LUMBAR SPINE FINDINGS Segmentation:  Standard. Alignment:  Physiologic. Vertebrae: Diffuse bone marrow edema at L4 and L5 with associated contrast enhancement and abnormal signal within the disc space. There is ventral epidural contrast enhancement at both levels that narrows the thecal sac. Conus medullaris: Extends to the L1 level and appears normal. Paraspinal and other soft tissues: Left  hydroureteronephrosis. Bilateral psoas muscle edema and abnormal contrast enhancement without focal fluid collection. Disc levels: L1-L2: Normal disc space and facet joints. No spinal canal stenosis. No neural foraminal stenosis. L2-L3: Normal disc space and facet joints. No spinal canal stenosis. No neural foraminal stenosis. L3-L4: Small disc bulge and mild facet hypertrophy. No spinal canal stenosis. No neural foraminal stenosis. L4-L5: Disc abnormalities as above. Severe spinal canal stenosis. Moderate right and severe left neural foraminal stenosis. L5-S1: Normal disc space and facet joints. No spinal canal stenosis. No neural foraminal stenosis. Visualized sacrum: Normal. IMPRESSION: 1. L4-L5 discitis-osteomyelitis with ventral epidural phlegmon/abscess resulting in severe spinal canal stenosis and moderate right and severe left neural foraminal stenosis. 2. Bilateral psoas muscle edema and abnormal contrast enhancement without focal fluid collection, consistent with myositis. 3. Left hydroureteronephrosis. 4. Small central disc protrusions at T6-7 and T8-9 contact the ventral spinal cord. Electronically Signed   By: Deatra Robinson M.D.   On: 11/11/2023 00:38   MR Lumbar Spine W Wo Contrast Result Date: 11/11/2023 CLINICAL DATA:  Mid back pain with infection suspected EXAM: MRI THORACIC AND LUMBAR SPINE WITHOUT AND WITH CONTRAST TECHNIQUE: Multiplanar and multiecho pulse sequences of the thoracic and lumbar spine were obtained without and with intravenous contrast. CONTRAST:  10mL GADAVIST GADOBUTROL 1 MMOL/ML IV SOLN COMPARISON:  None Available. FINDINGS: MRI THORACIC SPINE FINDINGS Alignment:  Physiologic. Vertebrae: No fracture, evidence of discitis, or bone lesion. Cord:  Normal signal and morphology. Paraspinal and other soft tissues: Negative. Disc levels: T6-7: Small central disc protrusion effaces the ventral thecal sac and contacts the spinal cord. T8-9: Small central disc protrusion contacts the  ventral spinal cord. The other thoracic disc levels are unremarkable. No abnormal contrast enhancement. MRI LUMBAR SPINE FINDINGS Segmentation:  Standard. Alignment:  Physiologic. Vertebrae: Diffuse bone marrow edema at L4 and L5 with associated contrast enhancement and abnormal signal within the disc space. There is ventral epidural contrast enhancement at both levels that narrows the thecal sac. Conus medullaris: Extends to the L1 level and appears normal. Paraspinal and other soft tissues: Left hydroureteronephrosis. Bilateral psoas muscle edema and abnormal contrast enhancement without focal fluid collection. Disc levels: L1-L2: Normal disc space and facet joints. No spinal canal stenosis. No neural foraminal stenosis. L2-L3: Normal disc space and facet joints. No spinal canal stenosis. No neural foraminal stenosis. L3-L4:  Small disc bulge and mild facet hypertrophy. No spinal canal stenosis. No neural foraminal stenosis. L4-L5: Disc abnormalities as above. Severe spinal canal stenosis. Moderate right and severe left neural foraminal stenosis. L5-S1: Normal disc space and facet joints. No spinal canal stenosis. No neural foraminal stenosis. Visualized sacrum: Normal. IMPRESSION: 1. L4-L5 discitis-osteomyelitis with ventral epidural phlegmon/abscess resulting in severe spinal canal stenosis and moderate right and severe left neural foraminal stenosis. 2. Bilateral psoas muscle edema and abnormal contrast enhancement without focal fluid collection, consistent with myositis. 3. Left hydroureteronephrosis. 4. Small central disc protrusions at T6-7 and T8-9 contact the ventral spinal cord. Electronically Signed   By: Deatra Robinson M.D.   On: 11/11/2023 00:38   CT Angio Chest PE W and/or Wo Contrast Result Date: 11/10/2023 CLINICAL DATA:  Right-sided flank pain shortness of breath positive D-dimer EXAM: CT ANGIOGRAPHY CHEST CT ABDOMEN AND PELVIS WITH CONTRAST TECHNIQUE: Multidetector CT imaging of the chest was  performed using the standard protocol during bolus administration of intravenous contrast. Multiplanar CT image reconstructions and MIPs were obtained to evaluate the vascular anatomy. Multidetector CT imaging of the abdomen and pelvis was performed using the standard protocol during bolus administration of intravenous contrast. RADIATION DOSE REDUCTION: This exam was performed according to the departmental dose-optimization program which includes automated exposure control, adjustment of the mA and/or kV according to patient size and/or use of iterative reconstruction technique. CONTRAST:  75mL OMNIPAQUE IOHEXOL 350 MG/ML SOLN COMPARISON:  CT 10/20/2021 FINDINGS: CTA CHEST FINDINGS Cardiovascular: Slightly suboptimal opacification of the pulmonary arteries to the segmental level. No evidence of pulmonary embolism. Normal heart size. No pericardial effusion. Nonaneurysmal aorta. Mediastinum/Nodes: Patent trachea. Slightly enlarged thyroid. Scattered thyroid nodules, largest seen in the inferior left lobe measuring 14 mm, no specific imaging follow-up is recommended. No suspicious lymph nodes. Esophagus within normal limits Lungs/Pleura: Minimal atelectasis at the right middle lobe. No acute airspace disease, pleural effusion or pneumothorax Musculoskeletal: No acute osseous abnormality Review of the MIP images confirms the above findings. CT ABDOMEN and PELVIS FINDINGS Hepatobiliary: Liver is enlarged with craniocaudal measurement of 23 cm. Diffuse decreased hepatic density consistent with steatosis. No calcified gallstone or biliary dilatation Pancreas: Unremarkable. No pancreatic ductal dilatation or surrounding inflammatory changes. Spleen: Normal in size without focal abnormality. Adrenals/Urinary Tract: Adrenal glands are normal. Diffuse hypoenhancement of the left kidney. Mild to moderate left hydronephrosis and proximal hydroureter. No obstructing stone. The bladder is unremarkable Stomach/Bowel: The stomach  is nonenlarged. No dilated small bowel. No acute bowel wall thickening. Negative appendix Vascular/Lymphatic: Nonaneurysmal aorta.  No suspicious lymph nodes. Reproductive: Uterus unremarkable. Complex left adnexal cyst measuring 4.4 by 3.7 cm. Other: Negative for free air. Musculoskeletal: Heterogenous left greater than right psoas muscle enlargement which appears slightly hyperdense. On the left, this extends to the left iliacus muscle where there is also stranding. No active extravasation. Abnormal prevertebral soft tissue thickening extending from about L3-L4 to the L5-S1 level. There is endplate destructive change at L4-L5 and heterogeneous sclerosis at the L4, L5 and S1 vertebral bodies which is new compared to the prior CT. Review of the MIP images confirms the above findings. IMPRESSION: 1. Negative for acute pulmonary embolus. 2. Diffuse hypoenhancement of the left kidney with mild to moderate left hydronephrosis and proximal hydroureter. No obstructing stone. Source of obstruction appears to be heterogenous soft tissue thickening in the left paraspinal region subsequently discussed. 3. Heterogenous left greater than right psoas muscle enlargement which appears slightly hyperdense. On the left, this extends to  the left iliacus muscle where there is also stranding. The overall appearance is suspect for retroperitoneal hematoma. There is no active extravasation on this exam. 4. Abnormal prevertebral soft tissue thickening extending from about L3-L4 to the L5-S1 level with endplate destructive change at L4-L5 and heterogeneous sclerosis at the L4, L5 and S1 vertebral bodies. Findings are highly suspicious for discitis/osteomyelitis. MRI of the lumbar spine with and without contrast is recommended for further evaluation. 5. Hepatomegaly with hepatic steatosis. 6. Complex left adnexal cyst measuring up to 4.4 cm. Correlation with pelvic ultrasound is suggested. Electronically Signed   By: Jasmine Pang M.D.    On: 11/10/2023 22:56   CT ABDOMEN PELVIS W CONTRAST Result Date: 11/10/2023 CLINICAL DATA:  Right-sided flank pain shortness of breath positive D-dimer EXAM: CT ANGIOGRAPHY CHEST CT ABDOMEN AND PELVIS WITH CONTRAST TECHNIQUE: Multidetector CT imaging of the chest was performed using the standard protocol during bolus administration of intravenous contrast. Multiplanar CT image reconstructions and MIPs were obtained to evaluate the vascular anatomy. Multidetector CT imaging of the abdomen and pelvis was performed using the standard protocol during bolus administration of intravenous contrast. RADIATION DOSE REDUCTION: This exam was performed according to the departmental dose-optimization program which includes automated exposure control, adjustment of the mA and/or kV according to patient size and/or use of iterative reconstruction technique. CONTRAST:  75mL OMNIPAQUE IOHEXOL 350 MG/ML SOLN COMPARISON:  CT 10/20/2021 FINDINGS: CTA CHEST FINDINGS Cardiovascular: Slightly suboptimal opacification of the pulmonary arteries to the segmental level. No evidence of pulmonary embolism. Normal heart size. No pericardial effusion. Nonaneurysmal aorta. Mediastinum/Nodes: Patent trachea. Slightly enlarged thyroid. Scattered thyroid nodules, largest seen in the inferior left lobe measuring 14 mm, no specific imaging follow-up is recommended. No suspicious lymph nodes. Esophagus within normal limits Lungs/Pleura: Minimal atelectasis at the right middle lobe. No acute airspace disease, pleural effusion or pneumothorax Musculoskeletal: No acute osseous abnormality Review of the MIP images confirms the above findings. CT ABDOMEN and PELVIS FINDINGS Hepatobiliary: Liver is enlarged with craniocaudal measurement of 23 cm. Diffuse decreased hepatic density consistent with steatosis. No calcified gallstone or biliary dilatation Pancreas: Unremarkable. No pancreatic ductal dilatation or surrounding inflammatory changes. Spleen: Normal  in size without focal abnormality. Adrenals/Urinary Tract: Adrenal glands are normal. Diffuse hypoenhancement of the left kidney. Mild to moderate left hydronephrosis and proximal hydroureter. No obstructing stone. The bladder is unremarkable Stomach/Bowel: The stomach is nonenlarged. No dilated small bowel. No acute bowel wall thickening. Negative appendix Vascular/Lymphatic: Nonaneurysmal aorta.  No suspicious lymph nodes. Reproductive: Uterus unremarkable. Complex left adnexal cyst measuring 4.4 by 3.7 cm. Other: Negative for free air. Musculoskeletal: Heterogenous left greater than right psoas muscle enlargement which appears slightly hyperdense. On the left, this extends to the left iliacus muscle where there is also stranding. No active extravasation. Abnormal prevertebral soft tissue thickening extending from about L3-L4 to the L5-S1 level. There is endplate destructive change at L4-L5 and heterogeneous sclerosis at the L4, L5 and S1 vertebral bodies which is new compared to the prior CT. Review of the MIP images confirms the above findings. IMPRESSION: 1. Negative for acute pulmonary embolus. 2. Diffuse hypoenhancement of the left kidney with mild to moderate left hydronephrosis and proximal hydroureter. No obstructing stone. Source of obstruction appears to be heterogenous soft tissue thickening in the left paraspinal region subsequently discussed. 3. Heterogenous left greater than right psoas muscle enlargement which appears slightly hyperdense. On the left, this extends to the left iliacus muscle where there is also stranding. The overall appearance  is suspect for retroperitoneal hematoma. There is no active extravasation on this exam. 4. Abnormal prevertebral soft tissue thickening extending from about L3-L4 to the L5-S1 level with endplate destructive change at L4-L5 and heterogeneous sclerosis at the L4, L5 and S1 vertebral bodies. Findings are highly suspicious for discitis/osteomyelitis. MRI of the  lumbar spine with and without contrast is recommended for further evaluation. 5. Hepatomegaly with hepatic steatosis. 6. Complex left adnexal cyst measuring up to 4.4 cm. Correlation with pelvic ultrasound is suggested. Electronically Signed   By: Jasmine Pang M.D.   On: 11/10/2023 22:56   DG Chest 2 View Result Date: 11/10/2023 CLINICAL DATA:  Shortness of breath for 2 weeks. EXAM: CHEST - 2 VIEW COMPARISON:  10/20/2021 FINDINGS: The cardiomediastinal contours are normal. The lungs are clear. Pulmonary vasculature is normal. No consolidation, pleural effusion, or pneumothorax. No acute osseous abnormalities are seen. IMPRESSION: Negative radiographs of the chest. Electronically Signed   By: Narda Rutherford M.D.   On: 11/10/2023 17:24    Labs:  CBC: Recent Labs    11/10/23 1632 11/11/23 0627 11/12/23 0612  WBC 8.4 7.6 8.5  HGB 8.1* 7.5* 7.8*  HCT 27.5* 25.2* 26.6*  PLT 574* 440* 476*    COAGS: Recent Labs    11/11/23 0627  INR 1.2  APTT 37*    BMP: Recent Labs    11/10/23 1632 11/11/23 0627 11/12/23 0612  NA 135 135 134*  K 3.8 3.7 3.9  CL 101 104 101  CO2 21* 21* 23  GLUCOSE 119* 94 170*  BUN 11 8 14   CALCIUM 9.1 8.6* 8.8*  CREATININE 0.91 0.99 0.87  GFRNONAA >60 >60 >60    LIVER FUNCTION TESTS: Recent Labs    11/10/23 1632 11/11/23 0627  BILITOT 0.2 0.5  AST 9* 8*  ALT 9 8  ALKPHOS 73 65  PROT 8.3* 7.1  ALBUMIN 2.6* 2.3*    TUMOR MARKERS: No results for input(s): "AFPTM", "CEA", "CA199", "CHROMGRNA" in the last 8760 hours.  Assessment and Plan: Discitis/Osteomyelitis  Patient is a 38 y/o female with DM, polysubstance abuse and newly diagnosed L4-L5 discitis/osteomyelitis. Patient NPO starting at 9:34 am. Patient is not on anticoagulants. Vitals and labwork reviewed.   Risks and benefits of disc aspiration was discussed with the patient and/or patient's family including, but not limited to bleeding, infection, damage to adjacent structures or low  yield requiring additional tests.  All of the questions were answered and there is agreement to proceed.  Consent signed and in chart.   Thank you for this interesting consult.  I greatly enjoyed meeting Krystal [name] and look forward to participating in their care.  A copy of this report was sent to the requesting provider on this date.  Electronically Signed: Rosalita Levan, PA 11/12/2023, 11:13 AM   I spent a total of 20 Minutes    in face to face in clinical consultation, greater than 50% of which was counseling/coordinating care for osteomyelitis

## 2023-11-12 NOTE — TOC Initial Note (Signed)
 Transition of Care [facility]) - Initial/Assessment Note    Patient Details  Name: Krystal [name] MRN: [id number] Date of Birth: January 26, 1986  Transition of Care [facility]) CM/SW Contact:    Epifanio Lesches, RN Phone Number: 11/12/2023, 2:13 PM  Clinical Narrative:                 Presents with diarrhea, generalized weakness, right-sided chest and right flank/abdominal pain / Osteomyelitis of lumbar spine from [facility].   NCM @ bedside with pt to discuss d/c planning. Pt gave permission to speak in front of [employer].  Plan: IR to do disc aspirate, watch for cultures TEE pending...  NCM called [facility] to speak with supervisor Dinah Beers ( [facility]),[phone] regarding pt transition back. Message left with nurse. Morrie Sheldon to call NCM back.  TOC team following and will assist with needs.  Expected Discharge Plan: [facility] Barriers to Discharge: Continued Medical Work up   Patient Goals and CMS Choice            Expected Discharge Plan and Services   Discharge Planning Services: CM Consult                                          Prior Living Arrangements/Services                       Activities of Daily Living   ADL Screening (condition at time of admission) Independently performs ADLs?: Yes (appropriate for developmental age) Is the patient deaf or have difficulty hearing?: No Does the patient have difficulty seeing, even when wearing glasses/contacts?: No Does the patient have difficulty concentrating, remembering, or making decisions?: No  Permission Sought/Granted                  Emotional Assessment       Orientation: : Oriented to Situation, Oriented to  Time, Oriented to Place, Oriented to Self Alcohol / Substance Use: Not Applicable Psych Involvement: No (comment)  Admission diagnosis:  Epidural abscess [G06.2] Vertebral osteomyelitis (HCC) [M46.20] Osteomyelitis of lumbar spine (HCC)  [M46.26] Anemia, unspecified type [D64.9] In police custody [Z65.3] Patient Active Problem List   Diagnosis Date Noted   Osteomyelitis of lumbar spine (HCC) 11/11/2023   Abscess in epidural space of lumbar spine 11/11/2023   Low back pain 11/11/2023   Hydroureteronephrosis 11/11/2023   AKI (acute kidney injury) (HCC) 11/11/2023   Generalized weakness 11/11/2023   Microcytic anemia 11/11/2023   Epidural abscess 11/11/2023   VBAC (vaginal birth after Cesarean) 08/12/2020   Type 2 diabetes mellitus in pregnancy 08/11/2020   Supervision of high-risk pregnancy 08/11/2020   History of gestational hypertension 08/11/2020   BMI 40.0-44.9, adult (HCC) 05/17/2020   Pregnancy complicated by subutex maintenance, antepartum (HCC) 05/17/2020   Acute pain of right knee 10/13/2019   DM2 (diabetes mellitus, type 2) (HCC) 12/03/2018   Moderate episode of recurrent major depressive disorder (HCC) 12/03/2018   Pre-existing type 2 diabetes mellitus in pregnancy 10/19/2017   H/O: substance abuse (HCC) 06/04/2017   Bilateral knee pain 10/08/2014   Acne 05/14/2014   Alopecia 05/12/2014   History of VBAC 08/18/2013   GERD (gastroesophageal reflux disease) 08/30/2012   Unwanted fertility 02/28/2011   Diarrhea 11/25/2010   HIDRADENITIS SUPPURATIVA 03/17/2009   ECZEMA, ATOPIC 08/26/2007   DEPRESSION, MILD 05/08/2007   Obesity in pregnancy 10/04/2006  TOBACCO DEPENDENCE 10/04/2006   MIGRAINE, UNSPEC., W/O INTRACTABLE MIGRAINE 10/04/2006   PCP:  Lorayne Bender, MD Pharmacy:   [facility] - [place], [place] - [address] [place] [address] Phone: [phone] Fax: [phone]  [id number] [place], [place] - [address] AT [facility] [address] [place] [address] Phone: [phone] Fax: [phone]  [facility] - [place], [place] - [address]  [place] [address] Phone: [phone] Fax: [phone]  [facility] [address] [place] [address] Phone: [phone] Fax: [phone]     Social Drivers of Health (SDOH) Social History: SDOH Screenings   Food Insecurity: No Food Insecurity (11/11/2023)  Housing: Low Risk  (11/11/2023)  Transportation Needs: No Transportation Needs (11/11/2023)  Utilities: Not At Risk (11/11/2023)  Alcohol Screen: Low Risk  (05/17/2020)  Depression (PHQ2-9): Medium Risk (10/19/2021)  Tobacco Use: High Risk (11/11/2023)   SDOH Interventions:     Readmission Risk Interventions     No data to display

## 2023-11-12 NOTE — Procedures (Signed)
 Brief IR postop note  Preop Dx: Discitis  Procedure performed: L4-L5 disc aspiration  Specimen: Approximately 1 cc bloody fluid removed and submitted for culture and stain.  Disposition: To recovery in stable condition.
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
## 2023-11-12 NOTE — Progress Notes (Signed)
 Pharmacy Antibiotic Note  Krystal [name] is a 38 y.o. female admitted on 11/10/2023 with  discitis/osteomyelitis .  Pharmacy has been consulted for Daptomycin dosing. Pt also on IV Rocephin. S/p IR guided aspirate 4/7  Plan: Daptomycin 800mg  IV q24h (8 mg/kg) Will f/u renal function, micro data, and pt's clinical condition Weekly CK on Tuesdays   Height: 5\' 4"  (162.6 cm) Weight: 96.5 kg (212 lb 11.9 oz) IBW/kg (Calculated) : 54.7  Temp (24hrs), Avg:98.2 F (36.8 C), Min:97.5 F (36.4 C), Max:98.6 F (37 C)  Recent Labs  Lab 11/10/23 1632 11/10/23 2353 11/11/23 0104 11/11/23 0627 11/12/23 0612  WBC 8.4  --   --  7.6 8.5  CREATININE 0.91  --   --  0.99 0.87  LATICACIDVEN  --  0.8 0.4*  --   --     Estimated Creatinine Clearance: 101.7 mL/min (by C-G formula based on SCr of 0.87 mg/dL).    No Known Allergies  Antimicrobials this admission: 4/7 Rocephin >> 4/7 Daptomycin >>  Microbiology results: 4/6 BCx: ngtd 4/5 BCx: ngtd 4/6 MRSA PCR: positive 4/7 abscess from intervertebral disc:  Thank you for allowing pharmacy to be a part of this patient's care.  Christoper Fabian, PharmD, BCPS Please see amion for complete clinical pharmacist phone list 11/12/2023 8:41 PM

## 2023-11-12 NOTE — Progress Notes (Addendum)
 PROGRESS NOTE    Krystal [name]  ZOX:[id number] DOB: 1986/04/17 DOA: 11/10/2023 PCP: Lorayne Bender, MD   Brief Narrative:  Krystal [name] is a 38 y.o. female with medical history significant for type 2 diabetes mellitus, opioid abuse(denies injections) who [facility] from [facility] with worsening generalized weakness of 3-4 days with intermittent low back pain for 6 months. Imaging here confirms L4/5 discitis/osteomyelitis with notable epidural abscess with concurrent severe spinal stenosis. Hospitalist called to admit as neurosurgery was consulted for further evaluation.  Assessment & Plan:   Principal Problem:   Osteomyelitis of lumbar spine (HCC) Active Problems:   Diarrhea   DM2 (diabetes mellitus, type 2) (HCC)   Abscess in epidural space of lumbar spine   Low back pain   Hydroureteronephrosis   AKI (acute kidney injury) (HCC)   Generalized weakness   Microcytic anemia   Epidural abscess  Osteomyelitis/discitis L3-S1, POA Left greater than right psoas versus iliacus myositis Does not meet sepsis criteria -Per imaging, area consistent with patient's area of complaint for pain -Neurosurgery signing off 4/7 - no acute intervention required at this time - IR plan for disc aspiration this afternoon -Hold antibiotics until cultures/aspiration can be performed -appreciate infectious disease insight recommendations -TTE pending -Pain currently well-controlled on current regimen including IV dilaudid and oxycodone -PT OT to follow once pain is more appropriately controlled -ESR 135, CK11  Nonspecified diarrheal illness, questionably resolved GI PCR panel pending, contact precautions ongoing  No episodes of diarrhea per patient since admission.  AKI, POA Mild to moderate left hydroureter hydronephrosis Without history of CKD Likely multifactorial in the setting of infection Urinary obstruction likely secondary to myositis and swelling of the iliopsoas Patient denies any  symptoms of dysuria hematuria or difficulty voiding Bladder scan unremarkable  Anemia, microcytic, iron deficient Elevated D dimer  -Continue supplement iron as appropriate -No signs or symptoms of bleeding at this time, no complaints of bleeding per patient -No indication for transfusion at this time -Distant history 2 years ago shows hemoglobin within normal limits -Iron panel notable for low iron/low saturation percent, reticulocyte count low normal   DVT prophylaxis: SCDs Start: 11/11/23 0158 Code Status:   Code Status: Full Code Family Communication: None present  Status is: Inpatient  Dispo: The patient is from: [place]              Anticipated d/c is to: Same              Anticipated d/c date is: 48 to 72 hours              Patient currently not medically stable for discharge  Consultants:  Neurosurgery Infectious disease Interventional radiology  Procedures:  None  Antimicrobials:  Holding per neurosurgery recommendations until sample can be obtained  Subjective: No acute issues or events overnight, pain ongoing but markedly improved from prior, agreeable for disc aspiration per discussion this morning.  N.p.o. as of 8 AM  Objective: Vitals:   11/11/23 1604 11/11/23 2030 11/12/23 0500 11/12/23 0511  BP: 134/87 (!) 128/93  123/74  Pulse: (!) 101 (!) 102  91  Resp: 18 19  16   Temp: 99.1 F (37.3 C) 98.5 F (36.9 C)  98.6 F (37 C)  TempSrc:  Oral  Oral  SpO2: 96% 98%  98%  Weight:   96.5 kg   Height:        Intake/Output Summary (Last 24 hours) at 11/12/2023 0754 Last data filed at 11/12/2023 0500 Gross per 24 hour  Intake 1254.74 ml  Output 0 ml  Net 1254.74 ml   Filed Weights   11/11/23 0319 11/12/23 0500  Weight: 102.5 kg 96.5 kg    Examination:   General:  Pleasantly resting in bed, No acute distress. HEENT:  Normocephalic atraumatic.  Sclerae nonicteric, noninjected.  Extraocular movements intact bilaterally. Neck:  Without mass or deformity.   Trachea is midline. Lungs:  Clear to auscultate bilaterally without rhonchi, wheeze, or rales. Heart:  Regular rate and rhythm.  Without murmurs, rubs, or gallops. Abdomen:  Soft, nontender, nondistended.  Without guarding or rebound. Extremities: Without cyanosis, clubbing, edema, or obvious deformity. Skin:  Warm and dry, no erythema.   Data Reviewed: I have personally reviewed following labs and imaging studies  CBC: Recent Labs  Lab 11/10/23 1632 11/11/23 0627 11/12/23 0612  WBC 8.4 7.6 8.5  NEUTROABS 6.4 5.1  --   HGB 8.1* 7.5* 7.8*  HCT 27.5* 25.2* 26.6*  MCV 74.5* 74.8* 75.1*  PLT 574* 440* 476*   Basic Metabolic Panel: Recent Labs  Lab 11/10/23 1632 11/11/23 0627 11/12/23 0612  NA 135 135 134*  K 3.8 3.7 3.9  CL 101 104 101  CO2 21* 21* 23  GLUCOSE 119* 94 170*  BUN 11 8 14   CREATININE 0.91 0.99 0.87  CALCIUM 9.1 8.6* 8.8*  MG 2.0 1.9  --    GFR: Estimated Creatinine Clearance: 101.7 mL/min (by C-G formula based on SCr of 0.87 mg/dL). Liver Function Tests: Recent Labs  Lab 11/10/23 1632 11/11/23 0627  AST 9* 8*  ALT 9 8  ALKPHOS 73 65  BILITOT 0.2 0.5  PROT 8.3* 7.1  ALBUMIN 2.6* 2.3*   Recent Labs  Lab 11/10/23 1632  LIPASE 22   HbA1C: Recent Labs    11/11/23 0627  HGBA1C 6.4*   CBG: Recent Labs  Lab 11/11/23 0631 11/11/23 1205 11/11/23 1619 11/12/23 0012 11/12/23 0627  GLUCAP 79 140* 100* 198* 167*   Anemia Panel: Recent Labs    11/11/23 0627  VITAMINB12 188  FOLATE 9.6  FERRITIN 247  TIBC 189*  IRON 14*  RETICCTPCT 1.4   Sepsis Labs: Recent Labs  Lab 11/10/23 2353 11/11/23 0104  LATICACIDVEN 0.8 0.4*    Recent Results (from the past 240 hours)  Resp panel by RT-PCR (RSV, Flu A&B, Covid) Anterior Nasal Swab     Status: None   Collection Time: 11/10/23  4:15 PM   Specimen: Anterior Nasal Swab  Result Value Ref Range Status   SARS Coronavirus 2 by RT PCR NEGATIVE NEGATIVE Final   Influenza A by PCR NEGATIVE  NEGATIVE Final   Influenza B by PCR NEGATIVE NEGATIVE Final    Comment: (NOTE) The Xpert Xpress SARS-CoV-2/FLU/RSV plus assay is intended as an aid in the diagnosis of influenza from Nasopharyngeal swab specimens and should not be used as a sole basis for treatment. Nasal washings and aspirates are unacceptable for Xpert Xpress SARS-CoV-2/FLU/RSV testing.  Fact Sheet for Patients: BloggerCourse.com  Fact Sheet for Healthcare Providers: SeriousBroker.it  This test is not yet approved or cleared by the [place] FDA and has been authorized for detection and/or diagnosis of SARS-CoV-2 by FDA under an Emergency Use Authorization (EUA). This EUA will remain in effect (meaning this test can be used) for the duration of the COVID-19 declaration under Section 564(b)(1) of the Act, 21 U.S.C. section 360bbb-3(b)(1), unless the authorization is terminated or revoked.     Resp Syncytial Virus by PCR NEGATIVE NEGATIVE Final    Comment: (  NOTE) Fact Sheet for Patients: BloggerCourse.com  Fact Sheet for Healthcare Providers: SeriousBroker.it  This test is not yet approved or cleared by the [place] FDA and has been authorized for detection and/or diagnosis of SARS-CoV-2 by FDA under an Emergency Use Authorization (EUA). This EUA will remain in effect (meaning this test can be used) for the duration of the COVID-19 declaration under Section 564(b)(1) of the Act, 21 U.S.C. section 360bbb-3(b)(1), unless the authorization is terminated or revoked.  Performed at [facility], [address], [place], [place] [address]   Blood culture (routine x 2)     Status: None (Preliminary result)   Collection Time: 11/10/23 11:03 PM   Specimen: BLOOD  Result Value Ref Range Status   Specimen Description BLOOD LEFT ANTECUBITAL  Final   Special Requests   Final    BOTTLES DRAWN AEROBIC AND  ANAEROBIC Blood Culture adequate volume   Culture   Final    NO GROWTH 2 DAYS Performed at [facility] [place] [facility], [address] [place], [place] [address]    Report Status PENDING  Incomplete  Blood culture (routine x 2)     Status: None (Preliminary result)   Collection Time: 11/11/23 12:57 AM   Specimen: BLOOD RIGHT HAND  Result Value Ref Range Status   Specimen Description BLOOD RIGHT HAND  Final   Special Requests   Final    BOTTLES DRAWN AEROBIC AND ANAEROBIC Blood Culture results may not be optimal due to an inadequate volume of blood received in culture bottles   Culture   Final    NO GROWTH 1 DAY Performed at [facility], [address], [place], [place] [address]    Report Status PENDING  Incomplete  MRSA Next Gen by PCR, Nasal     Status: Abnormal   Collection Time: 11/11/23  3:40 AM   Specimen: Nasal Mucosa; Nasal Swab  Result Value Ref Range Status   MRSA by PCR Next Gen DETECTED (A) NOT DETECTED Final    Comment: RESULT CALLED TO, READ BACK BY AND VERIFIED WITH: RN Lydia on [phone] @0925  by SM (NOTE) The GeneXpert MRSA Assay (FDA approved for NASAL specimens only), is one component of a comprehensive MRSA colonization surveillance program. It is not intended to diagnose MRSA infection nor to guide or monitor treatment for MRSA infections. Test performance is not FDA approved in patients less than 101 years old. Performed at [facility], [address], [place], [place] [address]          Radiology Studies: MR THORACIC SPINE W WO CONTRAST Result Date: 11/11/2023 CLINICAL DATA:  Mid back pain with infection suspected EXAM: MRI THORACIC AND LUMBAR SPINE WITHOUT AND WITH CONTRAST TECHNIQUE: Multiplanar and multiecho pulse sequences of the thoracic and lumbar spine were obtained without and with intravenous contrast. CONTRAST:  10mL GADAVIST GADOBUTROL 1 MMOL/ML IV SOLN COMPARISON:  None Available. FINDINGS: MRI THORACIC SPINE FINDINGS Alignment:   Physiologic. Vertebrae: No fracture, evidence of discitis, or bone lesion. Cord:  Normal signal and morphology. Paraspinal and other soft tissues: Negative. Disc levels: T6-7: Small central disc protrusion effaces the ventral thecal sac and contacts the spinal cord. T8-9: Small central disc protrusion contacts the ventral spinal cord. The other thoracic disc levels are unremarkable. No abnormal contrast enhancement. MRI LUMBAR SPINE FINDINGS Segmentation:  Standard. Alignment:  Physiologic. Vertebrae: Diffuse bone marrow edema at L4 and L5 with associated contrast enhancement and abnormal signal within the disc space. There is  ventral epidural contrast enhancement at both levels that narrows the thecal sac. Conus medullaris: Extends to the L1 level and appears normal. Paraspinal and other soft tissues: Left hydroureteronephrosis. Bilateral psoas muscle edema and abnormal contrast enhancement without focal fluid collection. Disc levels: L1-L2: Normal disc space and facet joints. No spinal canal stenosis. No neural foraminal stenosis. L2-L3: Normal disc space and facet joints. No spinal canal stenosis. No neural foraminal stenosis. L3-L4: Small disc bulge and mild facet hypertrophy. No spinal canal stenosis. No neural foraminal stenosis. L4-L5: Disc abnormalities as above. Severe spinal canal stenosis. Moderate right and severe left neural foraminal stenosis. L5-S1: Normal disc space and facet joints. No spinal canal stenosis. No neural foraminal stenosis. Visualized sacrum: Normal. IMPRESSION: 1. L4-L5 discitis-osteomyelitis with ventral epidural phlegmon/abscess resulting in severe spinal canal stenosis and moderate right and severe left neural foraminal stenosis. 2. Bilateral psoas muscle edema and abnormal contrast enhancement without focal fluid collection, consistent with myositis. 3. Left hydroureteronephrosis. 4. Small central disc protrusions at T6-7 and T8-9 contact the ventral spinal cord. Electronically  Signed   By: Deatra Robinson M.D.   On: 11/11/2023 00:38   MR Lumbar Spine W Wo Contrast Result Date: 11/11/2023 CLINICAL DATA:  Mid back pain with infection suspected EXAM: MRI THORACIC AND LUMBAR SPINE WITHOUT AND WITH CONTRAST TECHNIQUE: Multiplanar and multiecho pulse sequences of the thoracic and lumbar spine were obtained without and with intravenous contrast. CONTRAST:  10mL GADAVIST GADOBUTROL 1 MMOL/ML IV SOLN COMPARISON:  None Available. FINDINGS: MRI THORACIC SPINE FINDINGS Alignment:  Physiologic. Vertebrae: No fracture, evidence of discitis, or bone lesion. Cord:  Normal signal and morphology. Paraspinal and other soft tissues: Negative. Disc levels: T6-7: Small central disc protrusion effaces the ventral thecal sac and contacts the spinal cord. T8-9: Small central disc protrusion contacts the ventral spinal cord. The other thoracic disc levels are unremarkable. No abnormal contrast enhancement. MRI LUMBAR SPINE FINDINGS Segmentation:  Standard. Alignment:  Physiologic. Vertebrae: Diffuse bone marrow edema at L4 and L5 with associated contrast enhancement and abnormal signal within the disc space. There is ventral epidural contrast enhancement at both levels that narrows the thecal sac. Conus medullaris: Extends to the L1 level and appears normal. Paraspinal and other soft tissues: Left hydroureteronephrosis. Bilateral psoas muscle edema and abnormal contrast enhancement without focal fluid collection. Disc levels: L1-L2: Normal disc space and facet joints. No spinal canal stenosis. No neural foraminal stenosis. L2-L3: Normal disc space and facet joints. No spinal canal stenosis. No neural foraminal stenosis. L3-L4: Small disc bulge and mild facet hypertrophy. No spinal canal stenosis. No neural foraminal stenosis. L4-L5: Disc abnormalities as above. Severe spinal canal stenosis. Moderate right and severe left neural foraminal stenosis. L5-S1: Normal disc space and facet joints. No spinal canal  stenosis. No neural foraminal stenosis. Visualized sacrum: Normal. IMPRESSION: 1. L4-L5 discitis-osteomyelitis with ventral epidural phlegmon/abscess resulting in severe spinal canal stenosis and moderate right and severe left neural foraminal stenosis. 2. Bilateral psoas muscle edema and abnormal contrast enhancement without focal fluid collection, consistent with myositis. 3. Left hydroureteronephrosis. 4. Small central disc protrusions at T6-7 and T8-9 contact the ventral spinal cord. Electronically Signed   By: Deatra Robinson M.D.   On: 11/11/2023 00:38   CT Angio Chest PE W and/or Wo Contrast Result Date: 11/10/2023 CLINICAL DATA:  Right-sided flank pain shortness of breath positive D-dimer EXAM: CT ANGIOGRAPHY CHEST CT ABDOMEN AND PELVIS WITH CONTRAST TECHNIQUE: Multidetector CT imaging of the chest was performed using the standard protocol during bolus  administration of intravenous contrast. Multiplanar CT image reconstructions and MIPs were obtained to evaluate the vascular anatomy. Multidetector CT imaging of the abdomen and pelvis was performed using the standard protocol during bolus administration of intravenous contrast. RADIATION DOSE REDUCTION: This exam was performed according to the departmental dose-optimization program which includes automated exposure control, adjustment of the mA and/or kV according to patient size and/or use of iterative reconstruction technique. CONTRAST:  75mL OMNIPAQUE IOHEXOL 350 MG/ML SOLN COMPARISON:  CT 10/20/2021 FINDINGS: CTA CHEST FINDINGS Cardiovascular: Slightly suboptimal opacification of the pulmonary arteries to the segmental level. No evidence of pulmonary embolism. Normal heart size. No pericardial effusion. Nonaneurysmal aorta. Mediastinum/Nodes: Patent trachea. Slightly enlarged thyroid. Scattered thyroid nodules, largest seen in the inferior left lobe measuring 14 mm, no specific imaging follow-up is recommended. No suspicious lymph nodes. Esophagus within  normal limits Lungs/Pleura: Minimal atelectasis at the right middle lobe. No acute airspace disease, pleural effusion or pneumothorax Musculoskeletal: No acute osseous abnormality Review of the MIP images confirms the above findings. CT ABDOMEN and PELVIS FINDINGS Hepatobiliary: Liver is enlarged with craniocaudal measurement of 23 cm. Diffuse decreased hepatic density consistent with steatosis. No calcified gallstone or biliary dilatation Pancreas: Unremarkable. No pancreatic ductal dilatation or surrounding inflammatory changes. Spleen: Normal in size without focal abnormality. Adrenals/Urinary Tract: Adrenal glands are normal. Diffuse hypoenhancement of the left kidney. Mild to moderate left hydronephrosis and proximal hydroureter. No obstructing stone. The bladder is unremarkable Stomach/Bowel: The stomach is nonenlarged. No dilated small bowel. No acute bowel wall thickening. Negative appendix Vascular/Lymphatic: Nonaneurysmal aorta.  No suspicious lymph nodes. Reproductive: Uterus unremarkable. Complex left adnexal cyst measuring 4.4 by 3.7 cm. Other: Negative for free air. Musculoskeletal: Heterogenous left greater than right psoas muscle enlargement which appears slightly hyperdense. On the left, this extends to the left iliacus muscle where there is also stranding. No active extravasation. Abnormal prevertebral soft tissue thickening extending from about L3-L4 to the L5-S1 level. There is endplate destructive change at L4-L5 and heterogeneous sclerosis at the L4, L5 and S1 vertebral bodies which is new compared to the prior CT. Review of the MIP images confirms the above findings. IMPRESSION: 1. Negative for acute pulmonary embolus. 2. Diffuse hypoenhancement of the left kidney with mild to moderate left hydronephrosis and proximal hydroureter. No obstructing stone. Source of obstruction appears to be heterogenous soft tissue thickening in the left paraspinal region subsequently discussed. 3. Heterogenous  left greater than right psoas muscle enlargement which appears slightly hyperdense. On the left, this extends to the left iliacus muscle where there is also stranding. The overall appearance is suspect for retroperitoneal hematoma. There is no active extravasation on this exam. 4. Abnormal prevertebral soft tissue thickening extending from about L3-L4 to the L5-S1 level with endplate destructive change at L4-L5 and heterogeneous sclerosis at the L4, L5 and S1 vertebral bodies. Findings are highly suspicious for discitis/osteomyelitis. MRI of the lumbar spine with and without contrast is recommended for further evaluation. 5. Hepatomegaly with hepatic steatosis. 6. Complex left adnexal cyst measuring up to 4.4 cm. Correlation with pelvic ultrasound is suggested. Electronically Signed   By: Jasmine Pang M.D.   On: 11/10/2023 22:56   CT ABDOMEN PELVIS W CONTRAST Result Date: 11/10/2023 CLINICAL DATA:  Right-sided flank pain shortness of breath positive D-dimer EXAM: CT ANGIOGRAPHY CHEST CT ABDOMEN AND PELVIS WITH CONTRAST TECHNIQUE: Multidetector CT imaging of the chest was performed using the standard protocol during bolus administration of intravenous contrast. Multiplanar CT image reconstructions and MIPs were obtained  to evaluate the vascular anatomy. Multidetector CT imaging of the abdomen and pelvis was performed using the standard protocol during bolus administration of intravenous contrast. RADIATION DOSE REDUCTION: This exam was performed according to the departmental dose-optimization program which includes automated exposure control, adjustment of the mA and/or kV according to patient size and/or use of iterative reconstruction technique. CONTRAST:  75mL OMNIPAQUE IOHEXOL 350 MG/ML SOLN COMPARISON:  CT 10/20/2021 FINDINGS: CTA CHEST FINDINGS Cardiovascular: Slightly suboptimal opacification of the pulmonary arteries to the segmental level. No evidence of pulmonary embolism. Normal heart size. No  pericardial effusion. Nonaneurysmal aorta. Mediastinum/Nodes: Patent trachea. Slightly enlarged thyroid. Scattered thyroid nodules, largest seen in the inferior left lobe measuring 14 mm, no specific imaging follow-up is recommended. No suspicious lymph nodes. Esophagus within normal limits Lungs/Pleura: Minimal atelectasis at the right middle lobe. No acute airspace disease, pleural effusion or pneumothorax Musculoskeletal: No acute osseous abnormality Review of the MIP images confirms the above findings. CT ABDOMEN and PELVIS FINDINGS Hepatobiliary: Liver is enlarged with craniocaudal measurement of 23 cm. Diffuse decreased hepatic density consistent with steatosis. No calcified gallstone or biliary dilatation Pancreas: Unremarkable. No pancreatic ductal dilatation or surrounding inflammatory changes. Spleen: Normal in size without focal abnormality. Adrenals/Urinary Tract: Adrenal glands are normal. Diffuse hypoenhancement of the left kidney. Mild to moderate left hydronephrosis and proximal hydroureter. No obstructing stone. The bladder is unremarkable Stomach/Bowel: The stomach is nonenlarged. No dilated small bowel. No acute bowel wall thickening. Negative appendix Vascular/Lymphatic: Nonaneurysmal aorta.  No suspicious lymph nodes. Reproductive: Uterus unremarkable. Complex left adnexal cyst measuring 4.4 by 3.7 cm. Other: Negative for free air. Musculoskeletal: Heterogenous left greater than right psoas muscle enlargement which appears slightly hyperdense. On the left, this extends to the left iliacus muscle where there is also stranding. No active extravasation. Abnormal prevertebral soft tissue thickening extending from about L3-L4 to the L5-S1 level. There is endplate destructive change at L4-L5 and heterogeneous sclerosis at the L4, L5 and S1 vertebral bodies which is new compared to the prior CT. Review of the MIP images confirms the above findings. IMPRESSION: 1. Negative for acute pulmonary embolus.  2. Diffuse hypoenhancement of the left kidney with mild to moderate left hydronephrosis and proximal hydroureter. No obstructing stone. Source of obstruction appears to be heterogenous soft tissue thickening in the left paraspinal region subsequently discussed. 3. Heterogenous left greater than right psoas muscle enlargement which appears slightly hyperdense. On the left, this extends to the left iliacus muscle where there is also stranding. The overall appearance is suspect for retroperitoneal hematoma. There is no active extravasation on this exam. 4. Abnormal prevertebral soft tissue thickening extending from about L3-L4 to the L5-S1 level with endplate destructive change at L4-L5 and heterogeneous sclerosis at the L4, L5 and S1 vertebral bodies. Findings are highly suspicious for discitis/osteomyelitis. MRI of the lumbar spine with and without contrast is recommended for further evaluation. 5. Hepatomegaly with hepatic steatosis. 6. Complex left adnexal cyst measuring up to 4.4 cm. Correlation with pelvic ultrasound is suggested. Electronically Signed   By: Jasmine Pang M.D.   On: 11/10/2023 22:56   DG Chest 2 View Result Date: 11/10/2023 CLINICAL DATA:  Shortness of breath for 2 weeks. EXAM: CHEST - 2 VIEW COMPARISON:  10/20/2021 FINDINGS: The cardiomediastinal contours are normal. The lungs are clear. Pulmonary vasculature is normal. No consolidation, pleural effusion, or pneumothorax. No acute osseous abnormalities are seen. IMPRESSION: Negative radiographs of the chest. Electronically Signed   By: Narda Rutherford M.D.   On:  11/10/2023 17:24    Scheduled Meds:  Chlorhexidine Gluconate Cloth  6 each Topical Daily   insulin aspart  0-6 Units Subcutaneous Q6H   mupirocin ointment  1 Application Nasal BID   Continuous Infusions:     LOS: 1 day   Time spent:  Azucena Fallen, DO [facility]  If 7PM-7AM, please contact night-coverage [email or web address]  11/12/2023, 7:54 AM

## 2023-11-13 ENCOUNTER — Inpatient Hospital Stay (HOSPITAL_COMMUNITY)

## 2023-11-13 DIAGNOSIS — G061 Intraspinal abscess and granuloma: Secondary | ICD-10-CM | POA: Diagnosis not present

## 2023-11-13 DIAGNOSIS — G062 Extradural and subdural abscess, unspecified: Secondary | ICD-10-CM | POA: Diagnosis not present

## 2023-11-13 DIAGNOSIS — R7881 Bacteremia: Secondary | ICD-10-CM | POA: Diagnosis not present

## 2023-11-13 DIAGNOSIS — M4626 Osteomyelitis of vertebra, lumbar region: Secondary | ICD-10-CM | POA: Diagnosis not present

## 2023-11-13 LAB — ECHOCARDIOGRAM COMPLETE
AV Mean grad: 6 mmHg
AV Peak grad: 15.5 mmHg
Ao pk vel: 1.97 m/s
Calc EF: 61.3 %
Height: 64 in
S' Lateral: 2.9 cm
Single Plane A2C EF: 61.8 %
Single Plane A4C EF: 60.7 %
Weight: 3403.9 [oz_av]

## 2023-11-13 LAB — CK: Total CK: 11 U/L — ABNORMAL LOW (ref 38–234)

## 2023-11-13 LAB — GLUCOSE, CAPILLARY
Glucose-Capillary: 181 mg/dL — ABNORMAL HIGH (ref 70–99)
Glucose-Capillary: 186 mg/dL — ABNORMAL HIGH (ref 70–99)
Glucose-Capillary: 213 mg/dL — ABNORMAL HIGH (ref 70–99)
Glucose-Capillary: 294 mg/dL — ABNORMAL HIGH (ref 70–99)

## 2023-11-13 MED ORDER — SERTRALINE HCL 100 MG PO TABS
100.0000 mg | ORAL_TABLET | Freq: Every day | ORAL | Status: DC
  Administered 2023-11-13 – 2023-11-20 (×8): 100 mg via ORAL
  Filled 2023-11-13 (×8): qty 1

## 2023-11-13 MED ORDER — HYDROXYZINE HCL 25 MG PO TABS
100.0000 mg | ORAL_TABLET | Freq: Two times a day (BID) | ORAL | Status: DC
  Administered 2023-11-13 – 2023-11-21 (×17): 100 mg via ORAL
  Filled 2023-11-13 (×17): qty 4

## 2023-11-13 MED ORDER — BUSPIRONE HCL 10 MG PO TABS
30.0000 mg | ORAL_TABLET | Freq: Two times a day (BID) | ORAL | Status: DC
  Administered 2023-11-13 – 2023-11-21 (×17): 30 mg via ORAL
  Filled 2023-11-13 (×17): qty 3

## 2023-11-13 MED ORDER — SENNOSIDES-DOCUSATE SODIUM 8.6-50 MG PO TABS
1.0000 | ORAL_TABLET | Freq: Two times a day (BID) | ORAL | Status: DC
  Administered 2023-11-13 – 2023-11-21 (×14): 1 via ORAL
  Filled 2023-11-13 (×17): qty 1

## 2023-11-13 MED ORDER — PRAZOSIN HCL 2 MG PO CAPS
2.0000 mg | ORAL_CAPSULE | Freq: Every day | ORAL | Status: DC
  Administered 2023-11-15 – 2023-11-20 (×4): 2 mg via ORAL
  Filled 2023-11-13 (×9): qty 1

## 2023-11-13 MED ORDER — TOPIRAMATE 25 MG PO TABS
50.0000 mg | ORAL_TABLET | Freq: Two times a day (BID) | ORAL | Status: DC
  Administered 2023-11-14 – 2023-11-21 (×10): 50 mg via ORAL
  Filled 2023-11-13 (×18): qty 2

## 2023-11-13 MED ORDER — DAPTOMYCIN-SODIUM CHLORIDE 700-0.9 MG/100ML-% IV SOLN
700.0000 mg | Freq: Every day | INTRAVENOUS | Status: DC
  Administered 2023-11-13 – 2023-11-21 (×9): 700 mg via INTRAVENOUS
  Filled 2023-11-13 (×10): qty 100

## 2023-11-13 MED ORDER — POLYETHYLENE GLYCOL 3350 17 G PO PACK
17.0000 g | PACK | Freq: Every day | ORAL | Status: DC
  Administered 2023-11-13: 17 g via ORAL
  Filled 2023-11-13 (×2): qty 1

## 2023-11-13 NOTE — Progress Notes (Signed)
  Echocardiogram 2D Echocardiogram has been performed.  Rosemary Holms, RDCS 11/13/2023, 3:19 PM

## 2023-11-13 NOTE — Progress Notes (Signed)
 [facility]  Date of Admission:  11/10/2023     Reason for Follow Up: Osteomyelitis of lumbar spine (HCC)  Total days of antibiotics 2         ASSESSMENT:  Krystal [name] is s/p L4-L5 disc aspiration with no organisms seen on Gram stain and no growth on culture.  Started on daptomycin and ceftriaxone and tolerating antibiotics with no adverse side effects.  Discussed plan of care for prolonged course of antibiotics.  Will continue with broad-spectrum antibiotics and monitor cultures with narrowing of antibiotics as able.  Therapeutic drug monitoring of CK levels and renal function. Blood cultures remain without growth to date.  Continue universal/standard precautions.  Remaining medical and supportive care per internal medicine.  PLAN:  Continue current dose of daptomycin and ceftriaxone  Therapeutic drug monitoring of CK levels and renal function.  Monitor aspiration specimen and blood cultures for organisms with narrowing of antibiotics as appropriate.  Continue universal/standard precautions. Remaining medical and supportive care per Internal Medicine.   Principal Problem:   Osteomyelitis of lumbar spine (HCC) Active Problems:   Diarrhea   DM2 (diabetes mellitus, type 2) (HCC)   Abscess in epidural space of lumbar spine   Low back pain   Hydroureteronephrosis   AKI (acute kidney injury) (HCC)   Generalized weakness   Microcytic anemia   Epidural abscess   In police custody   Vertebral osteomyelitis (HCC)    Chlorhexidine Gluconate Cloth  6 each Topical Daily   insulin aspart  0-6 Units Subcutaneous Q6H   mupirocin ointment  1 Application Nasal BID   polyethylene glycol  17 g Oral Daily   senna-docusate  1 tablet Oral BID    SUBJECTIVE:  Afebrile overnight with no acute events.  Status post aspiration for discitis/osteomyelitis.  No Known Allergies   Review of Systems: Review of Systems  Constitutional:  Negative for chills, fever  and weight loss.  Respiratory:  Negative for cough, shortness of breath and wheezing.   Cardiovascular:  Negative for chest pain and leg swelling.  Gastrointestinal:  Negative for abdominal pain, constipation, diarrhea, nausea and vomiting.  Musculoskeletal:  Positive for back pain.  Skin:  Negative for rash.      OBJECTIVE: Vitals:   11/12/23 1700 11/12/23 2012 11/13/23 0551 11/13/23 0750  BP: 122/78 131/77 125/87 127/85  Pulse: 99 99 100   Resp: 17 18 20 17   Temp: 97.8 F (36.6 C) 98.5 F (36.9 C) 98.5 F (36.9 C) 98.4 F (36.9 C)  TempSrc: Oral Oral Oral Oral  SpO2: 99% 98% 97% 99%  Weight:      Height:       Body mass index is 36.52 kg/m.  Physical Exam Constitutional:      General: Krystal [name] is not in acute distress.    Appearance: Krystal [name] is well-developed.  Cardiovascular:     Rate and Rhythm: Normal rate and regular rhythm.     Heart sounds: Normal heart sounds.  Pulmonary:     Effort: Pulmonary effort is normal.     Breath sounds: Normal breath sounds.  Skin:    General: Skin is warm and dry.  Neurological:     Mental Status: Krystal [name] is alert and oriented to person, place, and time.  Psychiatric:        Behavior: Behavior normal.        Thought Content: Thought content normal.        Judgment: Judgment normal.     Lab Results  Lab Results  Component Value Date   WBC 8.5 11/12/2023   HGB 7.8 (L) 11/12/2023   HCT 26.6 (L) 11/12/2023   MCV 75.1 (L) 11/12/2023   PLT 476 (H) 11/12/2023    Lab Results  Component Value Date   CREATININE 0.87 11/12/2023   BUN 14 11/12/2023   NA 134 (L) 11/12/2023   K 3.9 11/12/2023   CL 101 11/12/2023   CO2 23 11/12/2023    Lab Results  Component Value Date   ALT 8 11/11/2023   AST 8 (L) 11/11/2023   ALKPHOS 65 11/11/2023   BILITOT 0.5 11/11/2023     Microbiology: Recent Results (from the past 240 hours)  Resp panel by RT-PCR (RSV, Flu A&B, Covid) Anterior Nasal Swab     Status: None   Collection Time: 11/10/23  4:15  PM   Specimen: Anterior Nasal Swab  Result Value Ref Range Status   SARS Coronavirus 2 by RT PCR NEGATIVE NEGATIVE Final   Influenza A by PCR NEGATIVE NEGATIVE Final   Influenza B by PCR NEGATIVE NEGATIVE Final    Comment: (NOTE) The Xpert Xpress SARS-CoV-2/FLU/RSV plus assay is intended as an aid in the diagnosis of influenza from Nasopharyngeal swab specimens and should not be used as a sole basis for treatment. Nasal washings and aspirates are unacceptable for Xpert Xpress SARS-CoV-2/FLU/RSV testing.  Fact Sheet for Patients: BloggerCourse.com  Fact Sheet for Healthcare Providers: SeriousBroker.it  This test is not yet approved or cleared by the [place] FDA and has been authorized for detection and/or diagnosis of SARS-CoV-2 by FDA under an Emergency Use Authorization (EUA). This EUA will remain in effect (meaning this test can be used) for the duration of the COVID-19 declaration under Section 564(b)(1) of the Act, 21 U.S.C. section 360bbb-3(b)(1), unless the authorization is terminated or revoked.     Resp Syncytial Virus by PCR NEGATIVE NEGATIVE Final    Comment: (NOTE) Fact Sheet for Patients: BloggerCourse.com  Fact Sheet for Healthcare Providers: SeriousBroker.it  This test is not yet approved or cleared by the [place] FDA and has been authorized for detection and/or diagnosis of SARS-CoV-2 by FDA under an Emergency Use Authorization (EUA). This EUA will remain in effect (meaning this test can be used) for the duration of the COVID-19 declaration under Section 564(b)(1) of the Act, 21 U.S.C. section 360bbb-3(b)(1), unless the authorization is terminated or revoked.  Performed at [facility], [address], [place], [place] [address]   Blood culture (routine x 2)     Status: None (Preliminary result)   Collection Time: 11/10/23 11:03 PM    Specimen: BLOOD  Result Value Ref Range Status   Specimen Description BLOOD LEFT ANTECUBITAL  Final   Special Requests   Final    BOTTLES DRAWN AEROBIC AND ANAEROBIC Blood Culture adequate volume   Culture   Final    NO GROWTH 3 DAYS Performed at [facility], [address] [place], [place] [address]    Report Status PENDING  Incomplete  Blood culture (routine x 2)     Status: None (Preliminary result)   Collection Time: 11/11/23 12:57 AM   Specimen: BLOOD RIGHT HAND  Result Value Ref Range Status   Specimen Description BLOOD RIGHT HAND  Final   Special Requests   Final    BOTTLES DRAWN AEROBIC AND ANAEROBIC Blood Culture results may not be optimal due to an inadequate volume of blood received in culture bottles   Culture   Final  NO GROWTH 2 DAYS Performed at [facility], [address], [place], [place] [address]    Report Status PENDING  Incomplete  MRSA Next Gen by PCR, Nasal     Status: Abnormal   Collection Time: 11/11/23  3:40 AM   Specimen: Nasal Mucosa; Nasal Swab  Result Value Ref Range Status   MRSA by PCR Next Gen DETECTED (A) NOT DETECTED Final    Comment: RESULT CALLED TO, READ BACK BY AND VERIFIED WITH: RN Lydia on [phone] @0925  by SM (NOTE) The GeneXpert MRSA Assay (FDA approved for NASAL specimens only), is one component of a comprehensive MRSA colonization surveillance program. It is not intended to diagnose MRSA infection nor to guide or monitor treatment for MRSA infections. Test performance is not FDA approved in patients less than 43 years old. Performed at [facility], [address], [place], [place] [address]   Aerobic/Anaerobic Culture w Gram Stain (surgical/deep wound)     Status: None (Preliminary result)   Collection Time: 11/12/23  4:43 PM   Specimen: Abscess  Result Value Ref Range Status   Specimen Description ABSCESS  Final   Special Requests INTERVERTEBRAL DISC  Final   Gram Stain NO WBC SEEN NO ORGANISMS SEEN    Final   Culture   Final    NO GROWTH < 24 HOURS Performed at [facility], [address] [place], [place] [address]    Report Status PENDING  Incomplete    I have personally spent 26 minutes involved in face-to-face and non-face-to-face activities for this patient on the day of the visit. Professional time spent includes the following activities: Preparing to see the patient (review of tests), Obtaining and/or reviewing separately obtained history (admission/discharge record), Performing a medically appropriate examination and/or evaluation , Ordering medications/tests/procedures, referring and communicating with other health care professionals, Documenting clinical information in the EMR, Independently interpreting results (not separately reported), Communicating results to the patient/family/caregiver, Counseling and educating the patient/family/caregiver and Care coordination (not separately reported).    Marcos Eke, NP [facility]  11/13/2023  11:55 AM

## 2023-11-13 NOTE — Progress Notes (Signed)
 PROGRESS NOTE    JACLYN ANDY  WUJ:[id number] DOB: 1986-04-28 DOA: 11/10/2023 PCP: Lorayne Bender, MD   Brief Narrative:  Krystal [name] is a 38 y.o. female with medical history significant for type 2 diabetes mellitus, opioid abuse(denies injections) who presents from jail with worsening generalized weakness of 3-4 days with intermittent low back pain for 6 months. Imaging here confirms L4/5 discitis/osteomyelitis with notable epidural abscess with concurrent severe spinal stenosis. Hospitalist called to admit as neurosurgery was consulted for further evaluation.  Assessment & Plan:   Principal Problem:   Osteomyelitis of lumbar spine (HCC) Active Problems:   Depression   Diarrhea   DM2 (diabetes mellitus, type 2) (HCC)   Moderate episode of recurrent major depressive disorder (HCC)   Type 2 diabetes mellitus in pregnancy   Abscess in epidural space of lumbar spine   Low back pain   Hydroureteronephrosis   AKI (acute kidney injury) (HCC)   Generalized weakness   Microcytic anemia   Epidural abscess   In police custody   Vertebral osteomyelitis (HCC)   Osteomyelitis/discitis L3-S1, POA Left greater than right psoas versus iliacus myositis Does not meet sepsis criteria -Per imaging, area consistent with patient's area of complaint for pain -Neurosurgery signing off 4/7 - no acute intervention required at this time - disc aspiration completed by IR 4/7 -Continue daptomycin, ceftriaxone-appreciate infectious disease insight recommendations -TTE pending -Pain currently well-controlled on current regimen including IV dilaudid and oxycodone -PT OT to follow once pain is more appropriately controlled -ESR 135, CK11  Nonspecified diarrheal illness, questionably resolved GI PCR panel pending, contact precautions ongoing  No episodes of diarrhea per patient since admission.  AKI, POA Mild to moderate left hydroureter hydronephrosis Without history of CKD Likely  multifactorial in the setting of infection Urinary obstruction likely secondary to myositis and swelling of the iliopsoas Patient denies any symptoms of dysuria hematuria or difficulty voiding Bladder scan unremarkable  Anemia, microcytic, iron deficient Elevated D dimer  -Continue supplement iron as appropriate -No signs or symptoms of bleeding at this time, no complaints of bleeding per patient -No indication for transfusion at this time -Distant history 2 years ago shows hemoglobin within normal limits -Iron panel notable for low iron/low saturation percent, reticulocyte count low normal  Depression, well-controlled -Continue home medications including buspirone, hydroxyzine, prazosin, sertraline, topiramate  DVT prophylaxis: SCDs Start: 11/11/23 0158 Code Status:   Code Status: Full Code Family Communication: None present  Status is: Inpatient  Dispo: The patient is from: [place]              Anticipated d/c is to: Same              Anticipated d/c date is: 48 to 72 hours              Patient currently not medically stable for discharge  Consultants:  Neurosurgery Infectious disease Interventional radiology  Procedures:  None  Antimicrobials:  Holding per neurosurgery recommendations until sample can be obtained  Subjective: No acute issues or events overnight, pain improving, patient reports improvement with ambulation and endorses constipation.  She otherwise denies nausea vomiting diarrhea headache fevers chills or chest pain  Objective: Vitals:   11/12/23 1700 11/12/23 2012 11/13/23 0551 11/13/23 0750  BP: 122/78 131/77 125/87 127/85  Pulse: 99 99 100   Resp: 17 18 20 17   Temp: 97.8 F (36.6 C) 98.5 F (36.9 C) 98.5 F (36.9 C) 98.4 F (36.9 C)  TempSrc: Oral Oral Oral Oral  SpO2: 99% 98% 97% 99%  Weight:      Height:        Intake/Output Summary (Last 24 hours) at 11/13/2023 1204 Last data filed at 11/13/2023 0900 Gross per 24 hour  Intake 1516.25 ml   Output --  Net 1516.25 ml   Filed Weights   11/11/23 0319 11/12/23 0500  Weight: 102.5 kg 96.5 kg    Examination:   General:  Pleasantly resting in bed, No acute distress. HEENT:  Normocephalic atraumatic.  Sclerae nonicteric, noninjected.  Extraocular movements intact bilaterally. Neck:  Without mass or deformity.  Trachea is midline. Lungs:  Clear to auscultate bilaterally without rhonchi, wheeze, or rales. Heart:  Regular rate and rhythm.  Without murmurs, rubs, or gallops. Abdomen:  Soft, nontender, nondistended.  Without guarding or rebound. Extremities: Without cyanosis, clubbing, edema, or obvious deformity. Skin:  Warm and dry, no erythema.   Data Reviewed: I have personally reviewed following labs and imaging studies  CBC: Recent Labs  Lab 11/10/23 1632 11/11/23 0627 11/12/23 0612  WBC 8.4 7.6 8.5  NEUTROABS 6.4 5.1  --   HGB 8.1* 7.5* 7.8*  HCT 27.5* 25.2* 26.6*  MCV 74.5* 74.8* 75.1*  PLT 574* 440* 476*   Basic Metabolic Panel: Recent Labs  Lab 11/10/23 1632 11/11/23 0627 11/12/23 0612  NA 135 135 134*  K 3.8 3.7 3.9  CL 101 104 101  CO2 21* 21* 23  GLUCOSE 119* 94 170*  BUN 11 8 14   CREATININE 0.91 0.99 0.87  CALCIUM 9.1 8.6* 8.8*  MG 2.0 1.9  --    GFR: Estimated Creatinine Clearance: 101.7 mL/min (by C-G formula based on SCr of 0.87 mg/dL). Liver Function Tests: Recent Labs  Lab 11/10/23 1632 11/11/23 0627  AST 9* 8*  ALT 9 8  ALKPHOS 73 65  BILITOT 0.2 0.5  PROT 8.3* 7.1  ALBUMIN 2.6* 2.3*   Recent Labs  Lab 11/10/23 1632  LIPASE 22   HbA1C: Recent Labs    11/11/23 0627  HGBA1C 6.4*   CBG: Recent Labs  Lab 11/12/23 1142 11/12/23 1703 11/13/23 0048 11/13/23 0658 11/13/23 1131  GLUCAP 192* 97 186* 181* 213*   Anemia Panel: Recent Labs    11/11/23 0627  VITAMINB12 188  FOLATE 9.6  FERRITIN 247  TIBC 189*  IRON 14*  RETICCTPCT 1.4   Sepsis Labs: Recent Labs  Lab 11/10/23 2353 11/11/23 0104   LATICACIDVEN 0.8 0.4*    Recent Results (from the past 240 hours)  Resp panel by RT-PCR (RSV, Flu A&B, Covid) Anterior Nasal Swab     Status: None   Collection Time: 11/10/23  4:15 PM   Specimen: Anterior Nasal Swab  Result Value Ref Range Status   SARS Coronavirus 2 by RT PCR NEGATIVE NEGATIVE Final   Influenza A by PCR NEGATIVE NEGATIVE Final   Influenza B by PCR NEGATIVE NEGATIVE Final    Comment: (NOTE) The Xpert Xpress SARS-CoV-2/FLU/RSV plus assay is intended as an aid in the diagnosis of influenza from Nasopharyngeal swab specimens and should not be used as a sole basis for treatment. Nasal washings and aspirates are unacceptable for Xpert Xpress SARS-CoV-2/FLU/RSV testing.  Fact Sheet for Patients: BloggerCourse.com  Fact Sheet for Healthcare Providers: SeriousBroker.it  This test is not yet approved or cleared by the [place] FDA and has been authorized for detection and/or diagnosis of SARS-CoV-2 by FDA under an Emergency Use Authorization (EUA). This EUA will remain in effect (meaning this test can be used) for the  duration of the COVID-19 declaration under Section 564(b)(1) of the Act, 21 U.S.C. section 360bbb-3(b)(1), unless the authorization is terminated or revoked.     Resp Syncytial Virus by PCR NEGATIVE NEGATIVE Final    Comment: (NOTE) Fact Sheet for Patients: BloggerCourse.com  Fact Sheet for Healthcare Providers: SeriousBroker.it  This test is not yet approved or cleared by the [place] FDA and has been authorized for detection and/or diagnosis of SARS-CoV-2 by FDA under an Emergency Use Authorization (EUA). This EUA will remain in effect (meaning this test can be used) for the duration of the COVID-19 declaration under Section 564(b)(1) of the Act, 21 U.S.C. section 360bbb-3(b)(1), unless the authorization is terminated  or revoked.  Performed at [facility], [address], [place], [place] [address]   Blood culture (routine x 2)     Status: None (Preliminary result)   Collection Time: 11/10/23 11:03 PM   Specimen: BLOOD  Result Value Ref Range Status   Specimen Description BLOOD LEFT ANTECUBITAL  Final   Special Requests   Final    BOTTLES DRAWN AEROBIC AND ANAEROBIC Blood Culture adequate volume   Culture   Final    NO GROWTH 3 DAYS Performed at [facility], [address] [place], [place] [address]    Report Status PENDING  Incomplete  Blood culture (routine x 2)     Status: None (Preliminary result)   Collection Time: 11/11/23 12:57 AM   Specimen: BLOOD RIGHT HAND  Result Value Ref Range Status   Specimen Description BLOOD RIGHT HAND  Final   Special Requests   Final    BOTTLES DRAWN AEROBIC AND ANAEROBIC Blood Culture results may not be optimal due to an inadequate volume of blood received in culture bottles   Culture   Final    NO GROWTH 2 DAYS Performed at [facility], [address] [place], [place] [address]    Report Status PENDING  Incomplete  MRSA Next Gen by PCR, Nasal     Status: Abnormal   Collection Time: 11/11/23  3:40 AM   Specimen: Nasal Mucosa; Nasal Swab  Result Value Ref Range Status   MRSA by PCR Next Gen DETECTED (A) NOT DETECTED Final    Comment: RESULT CALLED TO, READ BACK BY AND VERIFIED WITH: RN Lydia on [phone] @[phone]  by SM (NOTE) The GeneXpert MRSA Assay (FDA approved for NASAL specimens only), is one component of a comprehensive MRSA colonization surveillance program. It is not intended to diagnose MRSA infection nor to guide or monitor treatment for MRSA infections. Test performance is not FDA approved in patients less than 51 years old. Performed at [facility], [address], [place], [place] [address]   Aerobic/Anaerobic Culture w Gram Stain (surgical/deep wound)     Status: None (Preliminary result)   Collection Time:  11/12/23  4:43 PM   Specimen: Abscess  Result Value Ref Range Status   Specimen Description ABSCESS  Final   Special Requests INTERVERTEBRAL DISC  Final   Gram Stain NO WBC SEEN NO ORGANISMS SEEN   Final   Culture   Final    NO GROWTH < 24 HOURS Performed at [facility], [address] [place], [place] [address]    Report Status PENDING  Incomplete         Radiology Studies: CT ASPIRATION N/S Result Date: 11/12/2023 INDICATION: Clinical concern for discitis/osteomyelitis. EXAM: CT-guided disc aspiration TECHNIQUE: Multidetector CT imaging of the lumbar spine was  performed following the standard protocol without IV contrast. RADIATION DOSE REDUCTION: This exam was performed according to the departmental dose-optimization program which includes automated exposure control, adjustment of the mA and/or kV according to patient size and/or use of iterative reconstruction technique. MEDICATIONS: No preoperative antibiotics. ANESTHESIA/SEDATION: Moderate (conscious) sedation was employed during this procedure. A total of Versed 3 mg, Dilaudid 1 mg, and Fentanyl 150 mcg was administered intravenously by the radiology nurse. Total intra-service moderate Sedation Time: 31 minutes. The patient's level of consciousness and vital signs were monitored continuously by radiology nursing throughout the procedure under my direct supervision. COMPLICATIONS: None immediate. PROCEDURE: Informed written consent was obtained from the patient after a thorough discussion of the procedural risks, benefits and alternatives. All questions were addressed. Maximal Sterile Barrier Technique was utilized including caps, mask, sterile gowns, sterile gloves, sterile drape, hand hygiene and skin antiseptic. A timeout was performed prior to the initiation of the procedure. Using intermittent CT guidance, a 17 gauge introducer needle was advanced into the disc space at L4-L5. Approximately 1 cc of bloody fluid was removed and  submitted for laboratory analysis. The needle was removed and a dressing was applied. IMPRESSION: 1. CT-guided disc aspiration of L4-L5 for clinical concern for discitis/osteomyelitis. Electronically Signed   By: Lowell Guitar M.D.   On: 11/12/2023 17:34    Scheduled Meds:  busPIRone  30 mg Oral BID   Chlorhexidine Gluconate Cloth  6 each Topical Daily   hydrOXYzine  100 mg Oral BID   insulin aspart  0-6 Units Subcutaneous Q6H   mupirocin ointment  1 Application Nasal BID   polyethylene glycol  17 g Oral Daily   prazosin  2 mg Oral QHS   senna-docusate  1 tablet Oral BID   sertraline  100 mg Oral QHS   topiramate  50 mg Oral BID   Continuous Infusions:  cefTRIAXone (ROCEPHIN)  IV Stopped (11/12/23 2139)   DAPTOmycin        LOS: 2 days   Time spent:  Azucena Fallen, DO [facility]  If 7PM-7AM, please contact night-coverage [email or web address]  11/13/2023, 12:04 PM

## 2023-11-13 NOTE — Progress Notes (Signed)
 Physical Therapy Treatment Patient Details Name: Krystal [name] MRN: [id number] DOB: 05-08-1986 Today's Date: 11/13/2023   History of Present Illness Patient is a 38 yo female presenting with diarrhea, generalized weakness and low back pain on 11/10/23. 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; L4-5 disc aspiration 4/7; PMH includes: DM2    PT Comments  Continuing work on functional mobility and activity tolerance;  Session limited to staying near the bed as she was unable to be unshackled at time of PT session; Able to get up and over to recliner near the bed, however; Recommend trying to setup a schedule perhaps with her guards so that she can walk wth hallways with PT and Mobility   If plan is discharge home, recommend the following:     Can travel by private vehicle        Equipment Recommendations  None recommended by PT    Recommendations for Other Services       Precautions / Restrictions Precautions Precautions: None Recall of Precautions/Restrictions: Intact Restrictions Weight Bearing Restrictions Per Provider Order: No     Mobility  Bed Mobility Overal bed mobility: Needs Assistance Bed Mobility: Supine to Sit     Supine to sit: Min assist     General bed mobility comments: Min handheld assist to pull to sit and steady    Transfers Overall transfer level: Needs assistance Equipment used: None Transfers: Sit to/from Stand, Bed to chair/wheelchair/BSC Sit to Stand: Supervision   Step pivot transfers: Supervision       General transfer comment: increased time to power up    Ambulation/Gait               General Gait Details: Deferred; pt unable to be unshackled at time of PT session   Stairs             Wheelchair Mobility     Tilt Bed    Modified Rankin (Stroke Patients Only)       Balance                                            Communication  Communication Communication: No apparent difficulties  Cognition Arousal: Alert Behavior During Therapy: Flat affect   PT - Cognitive impairments: No apparent impairments                         Following commands: Intact      Cueing    Exercises      General Comments General comments (skin integrity, edema, etc.): Positioned for comfort int eh recliner for lunch      Pertinent Vitals/Pain Pain Assessment Pain Assessment: 0-10 Pain Score: 6  Pain Location: low back Pain Descriptors / Indicators: Grimacing, Guarding, Discomfort Pain Intervention(s): Monitored during session    Home Living                          Prior Function            PT Goals (current goals can now be found in the care plan section) Acute Rehab PT Goals Patient Stated Goal: decrease pain PT Goal Formulation: With patient Time For Goal Achievement: 11/25/23 Potential to Achieve Goals: Good Progress towards PT goals: Progressing toward goals    Frequency    Min 1X/week  PT Plan      Co-evaluation              AM-PAC PT "6 Clicks" Mobility   Outcome Measure  Help needed turning from your back to your side while in a flat bed without using bedrails?: None Help needed moving from lying on your back to sitting on the side of a flat bed without using bedrails?: A Little Help needed moving to and from a bed to a chair (including a wheelchair)?: A Little Help needed standing up from a chair using your arms (e.g., wheelchair or bedside chair)?: A Little Help needed to walk in hospital room?: A Little Help needed climbing 3-5 steps with a railing? : A Little 6 Click Score: 19    End of Session   Activity Tolerance: Patient tolerated treatment well Patient left: in bed;with call bell/phone within reach;Other (comment) (police officers present in room)   PT Visit Diagnosis: Difficulty in walking, not elsewhere classified (R26.2);Pain     Time:  1610-9604 PT Time Calculation (min) (ACUTE ONLY): 13 min  Charges:    $Therapeutic Activity: 8-22 mins PT General Charges $$ ACUTE PT VISIT: 1 Visit                     Van Clines, PT  Acute Rehabilitation Services Office [phone] Secure Chat welcomed    Levi Aland 11/13/2023, 2:47 PM

## 2023-11-13 NOTE — Plan of Care (Signed)
  Problem: Education: Goal: Knowledge of General Education information will improve Description: Including pain rating scale, medication(s)/side effects and non-pharmacologic comfort measures Outcome: Progressing   Problem: Health Behavior/Discharge Planning: Goal: Ability to manage health-related needs will improve Outcome: Progressing   Problem: Clinical Measurements: Goal: Ability to maintain clinical measurements within normal limits will improve Outcome: Progressing Goal: Will remain free from infection Outcome: Progressing Goal: Diagnostic test results will improve Outcome: Progressing Goal: Respiratory complications will improve Outcome: Progressing Goal: Cardiovascular complication will be avoided Outcome: Progressing   Problem: Coping: Goal: Level of anxiety will decrease Outcome: Progressing   Problem: Elimination: Goal: Will not experience complications related to bowel motility Outcome: Progressing Goal: Will not experience complications related to urinary retention Outcome: Progressing   Problem: Safety: Goal: Ability to remain free from injury will improve Outcome: Progressing   Problem: Skin Integrity: Goal: Risk for impaired skin integrity will decrease Outcome: Progressing   Problem: Fluid Volume: Goal: Ability to maintain a balanced intake and output will improve Outcome: Progressing   Problem: Health Behavior/Discharge Planning: Goal: Ability to identify and utilize available resources and services will improve Outcome: Progressing   Problem: Metabolic: Goal: Ability to maintain appropriate glucose levels will improve Outcome: Progressing

## 2023-11-14 DIAGNOSIS — M4626 Osteomyelitis of vertebra, lumbar region: Secondary | ICD-10-CM | POA: Diagnosis not present

## 2023-11-14 LAB — BASIC METABOLIC PANEL WITH GFR
Anion gap: 10 (ref 5–15)
BUN: 13 mg/dL (ref 6–20)
CO2: 25 mmol/L (ref 22–32)
Calcium: 8.6 mg/dL — ABNORMAL LOW (ref 8.9–10.3)
Chloride: 100 mmol/L (ref 98–111)
Creatinine, Ser: 0.75 mg/dL (ref 0.44–1.00)
GFR, Estimated: >60 mL/min (ref >60)
Glucose, Bld: 201 mg/dL — ABNORMAL HIGH (ref 70–99)
Potassium: 4 mmol/L (ref 3.5–5.1)
Sodium: 135 mmol/L (ref 135–145)

## 2023-11-14 LAB — GLUCOSE, CAPILLARY
Glucose-Capillary: 176 mg/dL — ABNORMAL HIGH (ref 70–99)
Glucose-Capillary: 195 mg/dL — ABNORMAL HIGH (ref 70–99)
Glucose-Capillary: 226 mg/dL — ABNORMAL HIGH (ref 70–99)
Glucose-Capillary: 255 mg/dL — ABNORMAL HIGH (ref 70–99)
Glucose-Capillary: 338 mg/dL — ABNORMAL HIGH (ref 70–99)

## 2023-11-14 LAB — CBC
HCT: 25.7 % — ABNORMAL LOW (ref 36.0–46.0)
Hemoglobin: 7.7 g/dL — ABNORMAL LOW (ref 12.0–15.0)
MCH: 22 pg — ABNORMAL LOW (ref 26.0–34.0)
MCHC: 30 g/dL (ref 30.0–36.0)
MCV: 73.4 fL — ABNORMAL LOW (ref 80.0–100.0)
Platelets: 458 10*3/uL — ABNORMAL HIGH (ref 150–400)
RBC: 3.5 MIL/uL — ABNORMAL LOW (ref 3.87–5.11)
RDW: 14.7 % (ref 11.5–15.5)
WBC: 9.3 10*3/uL (ref 4.0–10.5)
nRBC: 0 % (ref 0.0–0.2)

## 2023-11-14 MED ORDER — INSULIN GLARGINE-YFGN 100 UNIT/ML ~~LOC~~ SOLN
10.0000 [IU] | Freq: Every day | SUBCUTANEOUS | Status: DC
  Administered 2023-11-14: 10 [IU] via SUBCUTANEOUS
  Filled 2023-11-14 (×2): qty 0.1

## 2023-11-14 MED ORDER — ENOXAPARIN SODIUM 40 MG/0.4ML IJ SOSY: 40.0000 mg | PREFILLED_SYRINGE | INTRAMUSCULAR | Status: DC

## 2023-11-14 MED ORDER — INSULIN ASPART 100 UNIT/ML IJ SOLN
0.0000 [IU] | Freq: Three times a day (TID) | INTRAMUSCULAR | Status: DC
  Administered 2023-11-14: 3 [IU] via SUBCUTANEOUS
  Administered 2023-11-14: 7 [IU] via SUBCUTANEOUS
  Administered 2023-11-15: 3 [IU] via SUBCUTANEOUS
  Administered 2023-11-15: 5 [IU] via SUBCUTANEOUS
  Administered 2023-11-15: 2 [IU] via SUBCUTANEOUS
  Administered 2023-11-16 (×2): 5 [IU] via SUBCUTANEOUS
  Administered 2023-11-16 – 2023-11-17 (×4): 2 [IU] via SUBCUTANEOUS
  Administered 2023-11-18 (×2): 3 [IU] via SUBCUTANEOUS
  Administered 2023-11-18: 2 [IU] via SUBCUTANEOUS
  Administered 2023-11-19: 3 [IU] via SUBCUTANEOUS
  Administered 2023-11-19: 2 [IU] via SUBCUTANEOUS
  Administered 2023-11-19 – 2023-11-20 (×2): 1 [IU] via SUBCUTANEOUS
  Administered 2023-11-20 – 2023-11-21 (×3): 3 [IU] via SUBCUTANEOUS
  Administered 2023-11-21: 2 [IU] via SUBCUTANEOUS

## 2023-11-14 MED ORDER — POLYETHYLENE GLYCOL 3350 17 G PO PACK
17.0000 g | PACK | Freq: Two times a day (BID) | ORAL | Status: DC
  Administered 2023-11-14 – 2023-11-21 (×8): 17 g via ORAL
  Filled 2023-11-14 (×13): qty 1

## 2023-11-14 MED ORDER — ENOXAPARIN SODIUM 60 MG/0.6ML IJ SOSY
50.0000 mg | PREFILLED_SYRINGE | INTRAMUSCULAR | Status: DC
  Administered 2023-11-14 – 2023-11-21 (×8): 50 mg via SUBCUTANEOUS
  Filled 2023-11-14 (×8): qty 0.6

## 2023-11-14 MED ORDER — VITAMIN B-12 1000 MCG PO TABS
1000.0000 ug | ORAL_TABLET | Freq: Every day | ORAL | Status: DC
  Administered 2023-11-14 – 2023-11-21 (×8): 1000 ug via ORAL
  Filled 2023-11-14 (×9): qty 1

## 2023-11-14 MED ORDER — INSULIN ASPART 100 UNIT/ML IJ SOLN
0.0000 [IU] | Freq: Every day | INTRAMUSCULAR | Status: DC
  Administered 2023-11-14: 3 [IU] via SUBCUTANEOUS
  Administered 2023-11-15 – 2023-11-20 (×2): 2 [IU] via SUBCUTANEOUS

## 2023-11-14 NOTE — Plan of Care (Signed)
  Problem: Clinical Measurements: Goal: Ability to maintain clinical measurements within normal limits will improve Outcome: Progressing Goal: Will remain free from infection Outcome: Progressing   Problem: Activity: Goal: Risk for activity intolerance will decrease Outcome: Progressing   Problem: Nutrition: Goal: Adequate nutrition will be maintained Outcome: Progressing   Problem: Pain Managment: Goal: General experience of comfort will improve and/or be controlled Outcome: Progressing   Problem: Safety: Goal: Ability to remain free from injury will improve Outcome: Progressing   Problem: Skin Integrity: Goal: Risk for impaired skin integrity will decrease Outcome: Progressing

## 2023-11-14 NOTE — Progress Notes (Signed)
 Physical Therapy Treatment Patient Details Name: Krystal [name] MRN: [id number] DOB: [birth year] Today's Date: 11/14/2023   History of Present Illness Patient is a 38 yo female presenting with diarrhea, generalized weakness and low back pain on 11/10/23. 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; L4-5 disc aspiration 4/7; PMH includes: DM2    PT Comments  Continuing work on functional mobility and activity tolerance;  Session focused on progressive amb, and Krystal [name] is making considerable progress; she discussed feeling very stiff; recommend pt was in the hallways daily; will consider teaching simple stertch routine as well    If plan is discharge home, recommend the following:     Can travel by private vehicle        Equipment Recommendations  None recommended by PT    Recommendations for Other Services       Precautions / Restrictions Precautions Precautions: None Recall of Precautions/Restrictions: Intact Restrictions Weight Bearing Restrictions Per Provider Order: No     Mobility  Bed Mobility Overal bed mobility: Needs Assistance Bed Mobility: Supine to Sit     Supine to sit: Contact guard     General bed mobility comments: HOB elevated and use of bedrails; no physical assist needed    Transfers Overall transfer level: Needs assistance Equipment used: None Transfers: Sit to/from Stand, Bed to chair/wheelchair/BSC Sit to Stand: Supervision   Step pivot transfers: Supervision       General transfer comment: increased time to power up    Ambulation/Gait Ambulation/Gait assistance: Supervision Gait Distance (Feet): 300 Feet   Gait Pattern/deviations: Step-through pattern, Antalgic, Decreased stride length, Wide base of support       General Gait Details: Inefficient gait with wide step width, but overall steady with no physical assist needed   Stairs             Wheelchair Mobility     Tilt Bed     Modified Rankin (Stroke Patients Only)       Balance Overall balance assessment: Mild deficits observed, not formally tested                                          Communication Communication Communication: No apparent difficulties  Cognition Arousal: Alert Behavior During Therapy: Flat affect   PT - Cognitive impairments: No apparent impairments                         Following commands: Intact      Cueing    Exercises      General Comments General comments (skin integrity, edema, etc.): We discussed using RW to push doen through UEs and "suspend" trunk for some gentle spinal column distraction; pt performed well      Pertinent Vitals/Pain Pain Assessment Pain Assessment: 0-10 Pain Score: 5  Pain Location: low back Pain Descriptors / Indicators: Grimacing, Guarding, Discomfort Pain Intervention(s): Monitored during session, Premedicated before session    Home Living                          Prior Function            PT Goals (current goals can now be found in the care plan section) Acute Rehab PT Goals Patient Stated Goal: decrease pain PT Goal Formulation: With patient Time For Goal Achievement: 11/25/23 Potential to  Achieve Goals: Good    Frequency    Min 1X/week      PT Plan      Co-evaluation              AM-PAC PT "6 Clicks" Mobility   Outcome Measure  Help needed turning from your back to your side while in a flat bed without using bedrails?: None Help needed moving from lying on your back to sitting on the side of a flat bed without using bedrails?: A Little Help needed moving to and from a bed to a chair (including a wheelchair)?: A Little Help needed standing up from a chair using your arms (e.g., wheelchair or bedside chair)?: A Little Help needed to walk in hospital room?: A Little Help needed climbing 3-5 steps with a railing? : A Little 6 Click Score: 19    End of Session    Activity Tolerance: Patient tolerated treatment well Patient left: in bed;with call bell/phone within reach;Other (comment) (police officers present in room) Nurse Communication: Mobility status PT Visit Diagnosis: Difficulty in walking, not elsewhere classified (R26.2);Pain     Time: 1114-1130 PT Time Calculation (min) (ACUTE ONLY): 16 min  Charges:    $Gait Training: 8-22 mins PT General Charges $$ ACUTE PT VISIT: 1 Visit                     Van Clines, PT  Acute Rehabilitation Services Office [phone] Secure Chat welcomed    Levi Aland 11/14/2023, 3:11 PM

## 2023-11-14 NOTE — Progress Notes (Signed)
 PROGRESS NOTE    Krystal [name]  ZOX:[id number] DOB: 1986-07-05 DOA: 11/10/2023 PCP: Lorayne Bender, MD    Brief Narrative:  38 year old with type 2 diabetes on insulin, opiate abuse presented from jail with worsening generalized weakness and intermittent low back pain for 6 months.  Hemodynamically stable and neurologically stable in the ER.  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  Admitted with ID consultation.  Subjective: Patient seen in the morning rounds.  At rest she denies any complaints.  She does have significant pain on mobility.  Denies any referred pain.  Multiple low enforcement officers at the bedside. 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 IR aspirated fluid, blood cultures and aspirates are negative so far. TTE with no evidence of endocarditis.  With negative blood cultures, likely does not need TEE. Seen by ID, started on daptomycin and ceftriaxone until final cultures. Adequate pain medications.  Oral and IV opiates.  Continue mobility.  Chronic microcytic anemia: Supplemental iron.  No evidence of active bleeding.  B12 deficiency: Start supplementation.  Type 2 diabetes: Blood sugars started to elevate.  Start back on long-acting insulin.  Constipation: Increase dose of MiraLAX to twice daily.  Senokot twice daily.     DVT prophylaxis: SCDs Start: 11/11/23 0158   Code Status: Full code Family Communication: None at the bedside. Disposition Plan: Status is: Inpatient Remains inpatient appropriate because: IV antibiotics     Consultants:  Infectious disease Neurosurgery  Procedures:  IR guided aspiration  Antimicrobials:  Daptomycin and  ceftriaxone/8     Objective: Vitals:   11/13/23 0750 11/13/23 1526 11/13/23 2008 11/14/23 1001  BP: 127/85 132/76 (!) 138/97 (!) 140/95  Pulse:   99   Resp: 17 17 16 18   Temp: 98.4 F (36.9 C) 98.6 F (37 C) 98.9 F (37.2 C) 98.6 F (37 C)  TempSrc: Oral Oral    SpO2: 99% 93% 100% 97%  Weight:      Height:        Intake/Output Summary (Last 24 hours) at 11/14/2023 1257 Last data filed at 11/14/2023 0417 Gross per 24 hour  Intake 828.51 ml  Output --  Net 828.51 ml   Filed Weights   11/11/23 0319 11/12/23 0500  Weight: 102.5 kg 96.5 kg    Examination:  General exam: Appears calm and comfortable  Respiratory system: Clear to auscultation. Respiratory effort normal. Cardiovascular system: S1 & S2 heard, RRR. Marland Kitchen Gastrointestinal system: Soft.  Nontender.  Bowel sound present. Central nervous system: Alert and oriented. No focal neurological deficits. Extremities: Symmetric 5 x 5 power. Skin: No rashes, lesions or ulcers Psychiatry: Judgement and insight appear normal. Mood & affect appropriate.     Data Reviewed: I have personally reviewed following labs and imaging studies  CBC: Recent Labs  Lab 11/10/23 1632 11/11/23 0627 11/12/23 0612 11/14/23 0541  WBC 8.4 7.6 8.5 9.3  NEUTROABS 6.4 5.1  --   --   HGB 8.1* 7.5* 7.8* 7.7*  HCT 27.5* 25.2* 26.6* 25.7*  MCV 74.5* 74.8* 75.1* 73.4*  PLT 574* 440* 476* 458*   Basic Metabolic Panel: Recent Labs  Lab 11/10/23 1632 11/11/23 0627 11/12/23 0612 11/14/23 0541  NA 135 135 134* 135  K 3.8 3.7 3.9 4.0  CL 101 104 101 100  CO2 21* 21*  23 25  GLUCOSE 119* 94 170* 201*  BUN 11 8 14 13   CREATININE 0.91 0.99 0.87 0.75  CALCIUM 9.1 8.6* 8.8* 8.6*  MG 2.0 1.9  --   --    GFR: Estimated Creatinine Clearance: 110.6 mL/min (by C-G formula based on SCr of 0.75 mg/dL). Liver Function Tests: Recent Labs  Lab 11/10/23 1632 11/11/23 0627  AST 9* 8*  ALT 9 8  ALKPHOS 73 65  BILITOT 0.2 0.5  PROT 8.3* 7.1   ALBUMIN 2.6* 2.3*   Recent Labs  Lab 11/10/23 1632  LIPASE 22   No results for input(s): "AMMONIA" in the last 168 hours. Coagulation Profile: Recent Labs  Lab 11/11/23 0627  INR 1.2   Cardiac Enzymes: Recent Labs  Lab 11/11/23 0627 11/13/23 0637  CKTOTAL 11* 11*   BNP (last 3 results) No results for input(s): "PROBNP" in the last 8760 hours. HbA1C: No results for input(s): "HGBA1C" in the last 72 hours. CBG: Recent Labs  Lab 11/13/23 1131 11/13/23 1611 11/14/23 0002 11/14/23 0612 11/14/23 1202  GLUCAP 213* 294* 195* 176* 338*   Lipid Profile: No results for input(s): "CHOL", "HDL", "LDLCALC", "TRIG", "CHOLHDL", "LDLDIRECT" in the last 72 hours. Thyroid Function Tests: No results for input(s): "TSH", "T4TOTAL", "FREET4", "T3FREE", "THYROIDAB" in the last 72 hours. Anemia Panel: No results for input(s): "VITAMINB12", "FOLATE", "FERRITIN", "TIBC", "IRON", "RETICCTPCT" in the last 72 hours. Sepsis Labs: Recent Labs  Lab 11/10/23 2353 11/11/23 0104  LATICACIDVEN 0.8 0.4*    Recent Results (from the past 240 hours)  Resp panel by RT-PCR (RSV, Flu A&B, Covid) Anterior Nasal Swab     Status: None   Collection Time: 11/10/23  4:15 PM   Specimen: Anterior Nasal Swab  Result Value Ref Range Status   SARS Coronavirus 2 by RT PCR NEGATIVE NEGATIVE Final   Influenza A by PCR NEGATIVE NEGATIVE Final   Influenza B by PCR NEGATIVE NEGATIVE Final    Comment: (NOTE) The Xpert Xpress SARS-CoV-2/FLU/RSV plus assay is intended as an aid in the diagnosis of influenza from Nasopharyngeal swab specimens and should not be used as a sole basis for treatment. Nasal washings and aspirates are unacceptable for Xpert Xpress SARS-CoV-2/FLU/RSV testing.  Fact Sheet for Patients: BloggerCourse.com  Fact Sheet for Healthcare Providers: SeriousBroker.it  This test is not yet approved or cleared by the [place] FDA and has  been authorized for detection and/or diagnosis of SARS-CoV-2 by FDA under an Emergency Use Authorization (EUA). This EUA will remain in effect (meaning this test can be used) for the duration of the COVID-19 declaration under Section 564(b)(1) of the Act, 21 U.S.C. section 360bbb-3(b)(1), unless the authorization is terminated or revoked.     Resp Syncytial Virus by PCR NEGATIVE NEGATIVE Final    Comment: (NOTE) Fact Sheet for Patients: BloggerCourse.com  Fact Sheet for Healthcare Providers: SeriousBroker.it  This test is not yet approved or cleared by the [place] FDA and has been authorized for detection and/or diagnosis of SARS-CoV-2 by FDA under an Emergency Use Authorization (EUA). This EUA will remain in effect (meaning this test can be used) for the duration of the COVID-19 declaration under Section 564(b)(1) of the Act, 21 U.S.C. section 360bbb-3(b)(1), unless the authorization is terminated or revoked.  Performed at [facility], [address], [place], [place] [address]   Blood culture (routine x 2)     Status: None (Preliminary result)   Collection Time: 11/10/23 11:03 PM   Specimen: BLOOD  Result  Value Ref Range Status   Specimen Description BLOOD LEFT ANTECUBITAL  Final   Special Requests   Final    BOTTLES DRAWN AEROBIC AND ANAEROBIC Blood Culture adequate volume   Culture   Final    NO GROWTH 4 DAYS Performed at [facility], [address], [place], [place] [address]    Report Status PENDING  Incomplete  Blood culture (routine x 2)     Status: None (Preliminary result)   Collection Time: 11/11/23 12:57 AM   Specimen: BLOOD RIGHT HAND  Result Value Ref Range Status   Specimen Description BLOOD RIGHT HAND  Final   Special Requests   Final    BOTTLES DRAWN AEROBIC AND ANAEROBIC Blood Culture results may not be optimal due to an inadequate volume of blood received in culture bottles   Culture    Final    NO GROWTH 3 DAYS Performed at [facility], [address], [place], [place] [address]    Report Status PENDING  Incomplete  MRSA Next Gen by PCR, Nasal     Status: Abnormal   Collection Time: 11/11/23  3:40 AM   Specimen: Nasal Mucosa; Nasal Swab  Result Value Ref Range Status   MRSA by PCR Next Gen DETECTED (A) NOT DETECTED Final    Comment: RESULT CALLED TO, READ BACK BY AND VERIFIED WITH: RN Isabelle Course on [phone] @0925  by SM (NOTE) The GeneXpert MRSA Assay (FDA approved for NASAL specimens only), is one component of a comprehensive MRSA colonization surveillance program. It is not intended to diagnose MRSA infection nor to guide or monitor treatment for MRSA infections. Test performance is not FDA approved in patients less than 71 years old. Performed at [facility], [address] [place], [place] [address]   Aerobic/Anaerobic Culture w Gram Stain (surgical/deep wound)     Status: None (Preliminary result)   Collection Time: 11/12/23  4:43 PM   Specimen: Abscess  Result Value Ref Range Status   Specimen Description ABSCESS  Final   Special Requests INTERVERTEBRAL DISC  Final   Gram Stain NO WBC SEEN NO ORGANISMS SEEN   Final   Culture   Final    NO GROWTH 2 DAYS Performed at [facility], [address], [place], [place] [address]    Report Status PENDING  Incomplete         Radiology Studies: ECHOCARDIOGRAM COMPLETE Result Date: 11/13/2023    ECHOCARDIOGRAM REPORT   Patient Name:   LYNORA DYMOND Hachey Date of Exam: 11/13/2023 Medical Rec #:  [id number]          Height:       64.0 in Accession #:    [id number]         Weight:       212.7 lb Date of Birth:  1985-09-14          BSA:          2.008 m Patient Age:    37 years           BP:           127/85 mmHg Patient Gender: F                  HR:           103 bpm. Exam Location:  Inpatient Procedure: 2D Echo, Color Doppler and Cardiac Doppler (Both Spectral and Color            Flow Doppler  were  utilized during procedure). Indications:    Bacteremia  History:        Patient has no prior history of Echocardiogram examinations.                 Risk Factors:Diabetes.  Sonographer:    Maxwell Marion Referring Phys: [id number] Azucena Fallen IMPRESSIONS  1. Left ventricular ejection fraction, by estimation, is 60 to 65%. The left ventricle has normal function. The left ventricle has no regional wall motion abnormalities. Left ventricular diastolic parameters were normal.  2. Right ventricular systolic function is normal. The right ventricular size is normal.  3. The mitral valve is normal in structure. No evidence of mitral valve regurgitation. No evidence of mitral stenosis.  4. The aortic valve was not well visualized. Aortic valve regurgitation is not visualized. No aortic stenosis is present.  5. The inferior vena cava is normal in size with greater than 50% respiratory variability, suggesting right atrial pressure of 3 mmHg. Comparison(s): No prior Echocardiogram. FINDINGS  Left Ventricle: Left ventricular ejection fraction, by estimation, is 60 to 65%. The left ventricle has normal function. The left ventricle has no regional wall motion abnormalities. Strain was performed and the global longitudinal strain is indeterminate. The left ventricular internal cavity size was normal in size. Suboptimal image quality limits for assessment of left ventricular hypertrophy. Left ventricular diastolic parameters were normal. Right Ventricle: The right ventricular size is normal. No increase in right ventricular wall thickness. Right ventricular systolic function is normal. Left Atrium: Left atrial size was normal in size. Right Atrium: Right atrial size was normal in size. Pericardium: There is no evidence of pericardial effusion. Mitral Valve: The mitral valve is normal in structure. No evidence of mitral valve regurgitation. No evidence of mitral valve stenosis. Tricuspid Valve: The tricuspid valve is normal in  structure. Tricuspid valve regurgitation is not demonstrated. No evidence of tricuspid stenosis. Aortic Valve: The aortic valve was not well visualized. Aortic valve regurgitation is not visualized. No aortic stenosis is present. Aortic valve mean gradient measures 6.0 mmHg. Aortic valve peak gradient measures 15.5 mmHg. Pulmonic Valve: The pulmonic valve was normal in structure. Pulmonic valve regurgitation is not visualized. No evidence of pulmonic stenosis. Aorta: The aortic root, ascending aorta and aortic arch are all structurally normal, with no evidence of dilitation or obstruction. Venous: The inferior vena cava is normal in size with greater than 50% respiratory variability, suggesting right atrial pressure of 3 mmHg. IAS/Shunts: The atrial septum is grossly normal. Additional Comments: 3D was performed not requiring image post processing on an independent workstation and was indeterminate.  LEFT VENTRICLE PLAX 2D LVIDd:         4.30 cm     Diastology LVIDs:         2.90 cm     LV e' medial:  13.50 cm/s LV PW:         1.50 cm     LV e' lateral: 14.00 cm/s LV IVS:        0.80 cm LVOT diam:     2.10 cm LVOT Area:     3.46 cm  LV Volumes (MOD) LV vol d, MOD A2C: 78.7 ml LV vol d, MOD A4C: 88.2 ml LV vol s, MOD A2C: 30.1 ml LV vol s, MOD A4C: 34.7 ml LV SV MOD A2C:     48.6 ml LV SV MOD A4C:     88.2 ml LV SV MOD BP:      51.4 ml  RIGHT VENTRICLE RV S prime:     11.70 cm/s TAPSE (M-mode): 1.6 cm LEFT ATRIUM             Index LA diam:        3.90 cm 1.94 cm/m LA Vol (A2C):   28.5 ml 14.19 ml/m LA Vol (A4C):   36.0 ml 17.92 ml/m LA Biplane Vol: 33.1 ml 16.48 ml/m  AORTIC VALVE AV Vmax:      197.00 cm/s AV Vmean:     115.000 cm/s AV VTI:       0.259 m AV Peak Grad: 15.5 mmHg AV Mean Grad: 6.0 mmHg  AORTA Ao Asc diam: 2.60 cm  SHUNTS Systemic Diam: 2.10 cm Riley Lam MD Electronically signed by Riley Lam MD Signature Date/Time: 11/13/2023/5:46:25 PM    Final    CT ASPIRATION N/S Result  Date: 11/12/2023 INDICATION: Clinical concern for discitis/osteomyelitis. EXAM: CT-guided disc aspiration TECHNIQUE: Multidetector CT imaging of the lumbar spine was performed following the standard protocol without IV contrast. RADIATION DOSE REDUCTION: This exam was performed according to the departmental dose-optimization program which includes automated exposure control, adjustment of the mA and/or kV according to patient size and/or use of iterative reconstruction technique. MEDICATIONS: No preoperative antibiotics. ANESTHESIA/SEDATION: Moderate (conscious) sedation was employed during this procedure. A total of Versed 3 mg, Dilaudid 1 mg, and Fentanyl 150 mcg was administered intravenously by the radiology nurse. Total intra-service moderate Sedation Time: 31 minutes. The patient's level of consciousness and vital signs were monitored continuously by radiology nursing throughout the procedure under my direct supervision. COMPLICATIONS: None immediate. PROCEDURE: Informed written consent was obtained from the patient after a thorough discussion of the procedural risks, benefits and alternatives. All questions were addressed. Maximal Sterile Barrier Technique was utilized including caps, mask, sterile gowns, sterile gloves, sterile drape, hand hygiene and skin antiseptic. A timeout was performed prior to the initiation of the procedure. Using intermittent CT guidance, a 17 gauge introducer needle was advanced into the disc space at L4-L5. Approximately 1 cc of bloody fluid was removed and submitted for laboratory analysis. The needle was removed and a dressing was applied. IMPRESSION: 1. CT-guided disc aspiration of L4-L5 for clinical concern for discitis/osteomyelitis. Electronically Signed   By: Lowell Guitar M.D.   On: 11/12/2023 17:34        Scheduled Meds:  busPIRone  30 mg Oral BID   Chlorhexidine Gluconate Cloth  6 each Topical Daily   vitamin B-12  1,000 mcg Oral Daily   enoxaparin (LOVENOX)  injection  50 mg Subcutaneous Q24H   hydrOXYzine  100 mg Oral BID   insulin aspart  0-5 Units Subcutaneous QHS   insulin aspart  0-9 Units Subcutaneous TID WC   insulin glargine-yfgn  10 Units Subcutaneous QHS   mupirocin ointment  1 Application Nasal BID   polyethylene glycol  17 g Oral BID   prazosin  2 mg Oral QHS   senna-docusate  1 tablet Oral BID   sertraline  100 mg Oral QHS   topiramate  50 mg Oral BID   Continuous Infusions:  cefTRIAXone (ROCEPHIN)  IV Stopped (11/14/23 0310)   DAPTOmycin Stopped (11/13/23 1738)     LOS: 3 days    Time spent: 35 minutes    Dorcas Carrow, MD [facility]

## 2023-11-15 DIAGNOSIS — M4626 Osteomyelitis of vertebra, lumbar region: Secondary | ICD-10-CM | POA: Diagnosis not present

## 2023-11-15 LAB — GLUCOSE, CAPILLARY
Glucose-Capillary: 222 mg/dL — ABNORMAL HIGH (ref 70–99)
Glucose-Capillary: 277 mg/dL — ABNORMAL HIGH (ref 70–99)
Glucose-Capillary: 152 mg/dL — ABNORMAL HIGH (ref 70–99)
Glucose-Capillary: 225 mg/dL — ABNORMAL HIGH (ref 70–99)

## 2023-11-15 LAB — CULTURE, BLOOD (ROUTINE X 2)
Culture: NO GROWTH
Special Requests: ADEQUATE

## 2023-11-15 MED ORDER — BISACODYL 10 MG RE SUPP
10.0000 mg | Freq: Once | RECTAL | Status: AC
  Administered 2023-11-15: 10 mg via RECTAL
  Filled 2023-11-15: qty 1

## 2023-11-15 MED ORDER — METFORMIN HCL 500 MG PO TABS
500.0000 mg | ORAL_TABLET | Freq: Two times a day (BID) | ORAL | Status: DC
  Administered 2023-11-15 – 2023-11-20 (×11): 500 mg via ORAL
  Filled 2023-11-15 (×11): qty 1

## 2023-11-15 MED ORDER — INSULIN GLARGINE-YFGN 100 UNIT/ML ~~LOC~~ SOLN
17.0000 [IU] | Freq: Every day | SUBCUTANEOUS | Status: DC
  Administered 2023-11-15 – 2023-11-16 (×2): 17 [IU] via SUBCUTANEOUS
  Filled 2023-11-15 (×3): qty 0.17

## 2023-11-15 NOTE — Progress Notes (Signed)
 [facility]  Date of Admission:  11/10/2023     Reason for Follow Up: Osteomyelitis of lumbar spine (HCC)  Total days of antibiotics 4         ASSESSMENT:  Ms. Lakatos aspiration cultures remain without growth in 3 days in the setting of L4-L5 discitis/osteomyelitis.  Continues to have back pain with some paresthesia of her left upper thigh.  Tolerating antibiotics with no adverse side effects.  Continue to monitor cultures for organisms with adjustments of antibiotics as able.  Continue current dose of daptomycin and ceftriaxone for broad-spectrum coverage.  Therapeutic drug monitoring of CK levels. Continue standard/universal precautions. Remaining medical and supportive care per Internal Medicine.   PLAN:  Continue current dose of daptomycin and ceftriaxone. Therapeutic drug monitoring of CK levels. Monitor cultures for organisms and narrow antibiotics as appropriate. Continue universal/standard precautions. Remaining medical and supportive care per internal medicine.  Principal Problem:   Osteomyelitis of lumbar spine (HCC) Active Problems:   Depression   Diarrhea   DM2 (diabetes mellitus, type 2) (HCC)   Moderate episode of recurrent major depressive disorder (HCC)   Type 2 diabetes mellitus in pregnancy   Abscess in epidural space of lumbar spine   Low back pain   Hydroureteronephrosis   AKI (acute kidney injury) (HCC)   Generalized weakness   Microcytic anemia   Epidural abscess   In police custody   Vertebral osteomyelitis (HCC)    busPIRone  30 mg Oral BID   vitamin B-12  1,000 mcg Oral Daily   enoxaparin (LOVENOX) injection  50 mg Subcutaneous Q24H   hydrOXYzine  100 mg Oral BID   insulin aspart  0-5 Units Subcutaneous QHS   insulin aspart  0-9 Units Subcutaneous TID WC   insulin glargine-yfgn  17 Units Subcutaneous QHS   metFORMIN  500 mg Oral BID WC   mupirocin ointment  1 Application Nasal BID   polyethylene glycol  17 g  Oral BID   prazosin  2 mg Oral QHS   senna-docusate  1 tablet Oral BID   sertraline  100 mg Oral QHS   topiramate  50 mg Oral BID    SUBJECTIVE:  Afebrile overnight with no acute events.  Continues to have back pain and now noticing some mild numbness/tingling of her left upper thigh.  Tolerating antibiotics with no adverse side effects.  Had a bowel movement today.  Denies fevers, chills, or sweats.  No Known Allergies   Review of Systems: Review of Systems  Constitutional:  Negative for chills, fever and weight loss.  Respiratory:  Negative for cough, shortness of breath and wheezing.   Cardiovascular:  Negative for chest pain and leg swelling.  Gastrointestinal:  Negative for abdominal pain, constipation, diarrhea, nausea and vomiting.  Musculoskeletal:  Positive for back pain.  Skin:  Negative for rash.      OBJECTIVE: Vitals:   11/14/23 1628 11/14/23 2112 11/15/23 0632 11/15/23 0743  BP: 137/89 123/83 124/80 131/84  Pulse: (!) 104 (!) 103 97 96  Resp: 18 18 16 18   Temp: 98.1 F (36.7 C) 99 F (37.2 C) 98.4 F (36.9 C) 97.9 F (36.6 C)  TempSrc:      SpO2: 95% 99% 95% 97%  Weight:      Height:       Body mass index is 36.52 kg/m.  Physical Exam Constitutional:      General: She is not in acute distress.    Appearance: She is well-developed.  Cardiovascular:     Rate and Rhythm: Normal rate and regular rhythm.     Heart sounds: Normal heart sounds.  Pulmonary:     Effort: Pulmonary effort is normal.     Breath sounds: Normal breath sounds.  Skin:    General: Skin is warm and dry.  Neurological:     Mental Status: She is alert and oriented to person, place, and time.     Lab Results Lab Results  Component Value Date   WBC 9.3 11/14/2023   HGB 7.7 (L) 11/14/2023   HCT 25.7 (L) 11/14/2023   MCV 73.4 (L) 11/14/2023   PLT 458 (H) 11/14/2023    Lab Results  Component Value Date   CREATININE 0.75 11/14/2023   BUN 13 11/14/2023   NA 135 11/14/2023    K 4.0 11/14/2023   CL 100 11/14/2023   CO2 25 11/14/2023    Lab Results  Component Value Date   ALT 8 11/11/2023   AST 8 (L) 11/11/2023   ALKPHOS 65 11/11/2023   BILITOT 0.5 11/11/2023     Microbiology: Recent Results (from the past 240 hours)  Resp panel by RT-PCR (RSV, Flu A&B, Covid) Anterior Nasal Swab     Status: None   Collection Time: 11/10/23  4:15 PM   Specimen: Anterior Nasal Swab  Result Value Ref Range Status   SARS Coronavirus 2 by RT PCR NEGATIVE NEGATIVE Final   Influenza A by PCR NEGATIVE NEGATIVE Final   Influenza B by PCR NEGATIVE NEGATIVE Final    Comment: (NOTE) The Xpert Xpress SARS-CoV-2/FLU/RSV plus assay is intended as an aid in the diagnosis of influenza from Nasopharyngeal swab specimens and should not be used as a sole basis for treatment. Nasal washings and aspirates are unacceptable for Xpert Xpress SARS-CoV-2/FLU/RSV testing.  Fact Sheet for Patients: BloggerCourse.com  Fact Sheet for Healthcare Providers: SeriousBroker.it  This test is not yet approved or cleared by the [place] FDA and has been authorized for detection and/or diagnosis of SARS-CoV-2 by FDA under an Emergency Use Authorization (EUA). This EUA will remain in effect (meaning this test can be used) for the duration of the COVID-19 declaration under Section 564(b)(1) of the Act, 21 U.S.C. section 360bbb-3(b)(1), unless the authorization is terminated or revoked.     Resp Syncytial Virus by PCR NEGATIVE NEGATIVE Final    Comment: (NOTE) Fact Sheet for Patients: BloggerCourse.com  Fact Sheet for Healthcare Providers: SeriousBroker.it  This test is not yet approved or cleared by the [place] FDA and has been authorized for detection and/or diagnosis of SARS-CoV-2 by FDA under an Emergency Use Authorization (EUA). This EUA will remain in effect (meaning this test  can be used) for the duration of the COVID-19 declaration under Section 564(b)(1) of the Act, 21 U.S.C. section 360bbb-3(b)(1), unless the authorization is terminated or revoked.  Performed at [facility], [address] [place], [place] [address]   Blood culture (routine x 2)     Status: None   Collection Time: 11/10/23 11:03 PM   Specimen: BLOOD  Result Value Ref Range Status   Specimen Description BLOOD LEFT ANTECUBITAL  Final   Special Requests   Final    BOTTLES DRAWN AEROBIC AND ANAEROBIC Blood Culture adequate volume   Culture   Final    NO GROWTH 5 DAYS Performed at [facility], [address], [place], [place] [address]    Report Status 11/15/2023 FINAL  Final  Blood culture (routine x 2)  Status: None (Preliminary result)   Collection Time: 11/11/23 12:57 AM   Specimen: BLOOD RIGHT HAND  Result Value Ref Range Status   Specimen Description BLOOD RIGHT HAND  Final   Special Requests   Final    BOTTLES DRAWN AEROBIC AND ANAEROBIC Blood Culture results may not be optimal due to an inadequate volume of blood received in culture bottles   Culture   Final    NO GROWTH 4 DAYS Performed at [facility], [address] [place], [place] [address]    Report Status PENDING  Incomplete  MRSA Next Gen by PCR, Nasal     Status: Abnormal   Collection Time: 11/11/23  3:40 AM   Specimen: Nasal Mucosa; Nasal Swab  Result Value Ref Range Status   MRSA by PCR Next Gen DETECTED (A) NOT DETECTED Final    Comment: RESULT CALLED TO, READ BACK BY AND VERIFIED WITH: RN Isabelle Course on [phone] @0925  by SM (NOTE) The GeneXpert MRSA Assay (FDA approved for NASAL specimens only), is one component of a comprehensive MRSA colonization surveillance program. It is not intended to diagnose MRSA infection nor to guide or monitor treatment for MRSA infections. Test performance is not FDA approved in patients less than 59 years old. Performed at [facility], [address] [place], [place] [address]   Aerobic/Anaerobic Culture w Gram Stain (surgical/deep wound)     Status: None (Preliminary result)   Collection Time: 11/12/23  4:43 PM   Specimen: Abscess  Result Value Ref Range Status   Specimen Description ABSCESS  Final   Special Requests INTERVERTEBRAL DISC  Final   Gram Stain NO WBC SEEN NO ORGANISMS SEEN   Final   Culture   Final    NO GROWTH 3 DAYS NO ANAEROBES ISOLATED; CULTURE IN PROGRESS FOR 5 DAYS Performed at [facility], [address] [place], [place] [address]    Report Status PENDING  Incomplete     Marcos Eke, NP [facility]  11/15/2023  12:53 PM

## 2023-11-15 NOTE — Progress Notes (Signed)
 PROGRESS NOTE    Krystal [name]  YQM:[id number] DOB: 1986/02/05 DOA: 11/10/2023 PCP: Lorayne Bender, MD    Brief Narrative:  38 year old with type 2 diabetes on insulin, opiate abuse presented from jail with worsening generalized weakness and intermittent low back pain for 6 months.  Hemodynamically stable and neurologically stable in the ER.  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  Admitted with ID consultation.  Subjective: Patient seen and examined.  Patient tells me that she does not feel well today.  She was able to walk in the hallway.  She feels some numbness on the left lateral thigh area.  She is walking around in the hallway with ankle cuffs on .  Pain is controlled.  Multiple low enforcement officer at the bedside.  Afebrile.  Cultures negative so far. Still no bowel movement.   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  Neural foraminal narrowing. Bilateral psoas muscle edema consistent with myositis  Seen by neurosurgery, recommended no surgical intervention. IR aspirated fluid, blood cultures and aspirates are negative so far. TTE with no evidence of endocarditis.  With negative blood cultures, likely does not need TEE. Seen by ID, started on daptomycin and ceftriaxone until final cultures. Adequate pain medications.  Oral and IV opiates.  Continue mobility.  Chronic microcytic anemia: Supplemental iron.  No evidence of active bleeding.  B12 deficiency: Start supplementation.  Type 2 diabetes: Blood sugars started to elevate.  Start back on long-acting insulin.  Increase to home dose.  Add metformin.  Constipation: Increase dose of MiraLAX to twice daily.  Senokot twice daily. 1 dose of Dulcolax suppository today.  Callback request [facility], called x 2 and has not received any  callback.   DVT prophylaxis: SCDs Start: 11/11/23 0158   Code Status: Full code Family Communication: None at the bedside. Disposition Plan: Status is: Inpatient Remains inpatient appropriate because: IV antibiotics     Consultants:  Infectious disease Neurosurgery  Procedures:  IR guided aspiration  Antimicrobials:  Daptomycin and ceftriaxone 4/8     Objective: Vitals:   11/14/23 1628 11/14/23 2112 11/15/23 0632 11/15/23 0743  BP: 137/89 123/83 124/80 131/84  Pulse: (!) 104 (!) 103 97 96  Resp: 18 18 16 18   Temp: 98.1 F (36.7 C) 99 F (37.2 C) 98.4 F (36.9 C) 97.9 F (36.6 C)  TempSrc:      SpO2: 95% 99% 95% 97%  Weight:      Height:        Intake/Output Summary (Last 24 hours) at 11/15/2023 1132 Last data filed at 11/14/2023 1400 Gross per 24 hour  Intake 480 ml  Output --  Net 480 ml   Filed Weights   11/11/23 0319 11/12/23 0500  Weight: 102.5 kg 96.5 kg    Examination:  General exam: Appears calm and comfortable  Respiratory system: Clear to auscultation. Respiratory effort normal. Cardiovascular system: S1 & S2 heard, RRR. Marland Kitchen Gastrointestinal system: Soft.  Nontender.  Bowel sound present. Central nervous system: Alert and oriented. No focal neurological deficits. Extremities: Symmetric 5 x 5 power. Skin: Patient does have loss of superficial sensation on L2/L3 dermatome distribution. She has equal motor strength and normal reflexes in both extremities.   Data Reviewed: I have personally reviewed following labs and imaging studies  CBC: Recent Labs  Lab 11/10/23 1632 11/11/23 5284 11/12/23 0612 11/14/23 0541  WBC 8.4 7.6 8.5 9.3  NEUTROABS 6.4 5.1  --   --   HGB 8.1* 7.5* 7.8* 7.7*  HCT 27.5* 25.2* 26.6* 25.7*  MCV 74.5* 74.8* 75.1* 73.4*  PLT 574* 440* 476* 458*   Basic Metabolic Panel: Recent Labs  Lab 11/10/23 1632 11/11/23 0627 11/12/23 0612 11/14/23 0541  NA 135 135 134* 135  K 3.8 3.7 3.9 4.0  CL 101 104 101 100   CO2 21* 21* 23 25  GLUCOSE 119* 94 170* 201*  BUN 11 8 14 13   CREATININE 0.91 0.99 0.87 0.75  CALCIUM 9.1 8.6* 8.8* 8.6*  MG 2.0 1.9  --   --    GFR: Estimated Creatinine Clearance: 110.6 mL/min (by C-G formula based on SCr of 0.75 mg/dL). Liver Function Tests: Recent Labs  Lab 11/10/23 1632 11/11/23 0627  AST 9* 8*  ALT 9 8  ALKPHOS 73 65  BILITOT 0.2 0.5  PROT 8.3* 7.1  ALBUMIN 2.6* 2.3*   Recent Labs  Lab 11/10/23 1632  LIPASE 22   No results for input(s): "AMMONIA" in the last 168 hours. Coagulation Profile: Recent Labs  Lab 11/11/23 0627  INR 1.2   Cardiac Enzymes: Recent Labs  Lab 11/11/23 0627 11/13/23 0637  CKTOTAL 11* 11*   BNP (last 3 results) No results for input(s): "PROBNP" in the last 8760 hours. HbA1C: No results for input(s): "HGBA1C" in the last 72 hours. CBG: Recent Labs  Lab 11/14/23 0612 11/14/23 1202 11/14/23 1627 11/14/23 2212 11/15/23 0633  GLUCAP 176* 338* 226* 255* 222*   Lipid Profile: No results for input(s): "CHOL", "HDL", "LDLCALC", "TRIG", "CHOLHDL", "LDLDIRECT" in the last 72 hours. Thyroid Function Tests: No results for input(s): "TSH", "T4TOTAL", "FREET4", "T3FREE", "THYROIDAB" in the last 72 hours. Anemia Panel: No results for input(s): "VITAMINB12", "FOLATE", "FERRITIN", "TIBC", "IRON", "RETICCTPCT" in the last 72 hours. Sepsis Labs: Recent Labs  Lab 11/10/23 2353 11/11/23 0104  LATICACIDVEN 0.8 0.4*    Recent Results (from the past 240 hours)  Resp panel by RT-PCR (RSV, Flu A&B, Covid) Anterior Nasal Swab     Status: None   Collection Time: 11/10/23  4:15 PM   Specimen: Anterior Nasal Swab  Result Value Ref Range Status   SARS Coronavirus 2 by RT PCR NEGATIVE NEGATIVE Final   Influenza A by PCR NEGATIVE NEGATIVE Final   Influenza B by PCR NEGATIVE NEGATIVE Final    Comment: (NOTE) The Xpert Xpress SARS-CoV-2/FLU/RSV plus assay is intended as an aid in the diagnosis of influenza from Nasopharyngeal swab  specimens and should not be used as a sole basis for treatment. Nasal washings and aspirates are unacceptable for Xpert Xpress SARS-CoV-2/FLU/RSV testing.  Fact Sheet for Patients: BloggerCourse.com  Fact Sheet for Healthcare Providers: SeriousBroker.it  This test is not yet approved or cleared by the [place] FDA and has been authorized for detection and/or diagnosis of SARS-CoV-2 by FDA under an Emergency Use Authorization (EUA). This EUA will remain in effect (meaning this test can be used) for the duration of the COVID-19 declaration under Section 564(b)(1) of the Act, 21 U.S.C. section 360bbb-3(b)(1), unless the authorization is terminated or revoked.     Resp Syncytial Virus by PCR NEGATIVE NEGATIVE Final    Comment: (NOTE) Fact Sheet for Patients: BloggerCourse.com  Fact Sheet for Healthcare Providers: SeriousBroker.it  This test is not yet approved or cleared by the [place] FDA and has been authorized for detection and/or diagnosis of SARS-CoV-2 by FDA under an Emergency Use Authorization (EUA).  This EUA will remain in effect (meaning this test can be used) for the duration of the COVID-19 declaration under Section 564(b)(1) of the Act, 21 U.S.C. section 360bbb-3(b)(1), unless the authorization is terminated or revoked.  Performed at [facility], [address] [place], [place] [address]   Blood culture (routine x 2)     Status: None   Collection Time: 11/10/23 11:03 PM   Specimen: BLOOD  Result Value Ref Range Status   Specimen Description BLOOD LEFT ANTECUBITAL  Final   Special Requests   Final    BOTTLES DRAWN AEROBIC AND ANAEROBIC Blood Culture adequate volume   Culture   Final    NO GROWTH 5 DAYS Performed at [facility], [address] [place], [place] [address]    Report Status 11/15/2023 FINAL  Final  Blood culture (routine x  2)     Status: None (Preliminary result)   Collection Time: 11/11/23 12:57 AM   Specimen: BLOOD RIGHT HAND  Result Value Ref Range Status   Specimen Description BLOOD RIGHT HAND  Final   Special Requests   Final    BOTTLES DRAWN AEROBIC AND ANAEROBIC Blood Culture results may not be optimal due to an inadequate volume of blood received in culture bottles   Culture   Final    NO GROWTH 4 DAYS Performed at [facility], [address] [place], [place] [address]    Report Status PENDING  Incomplete  MRSA Next Gen by PCR, Nasal     Status: Abnormal   Collection Time: 11/11/23  3:40 AM   Specimen: Nasal Mucosa; Nasal Swab  Result Value Ref Range Status   MRSA by PCR Next Gen DETECTED (A) NOT DETECTED Final    Comment: RESULT CALLED TO, READ BACK BY AND VERIFIED WITH: RN Lydia on [phone] @0925  by SM (NOTE) The GeneXpert MRSA Assay (FDA approved for NASAL specimens only), is one component of a comprehensive MRSA colonization surveillance program. It is not intended to diagnose MRSA infection nor to guide or monitor treatment for MRSA infections. Test performance is not FDA approved in patients less than 60 years old. Performed at [facility], [address], [place], [place] [address]   Aerobic/Anaerobic Culture w Gram Stain (surgical/deep wound)     Status: None (Preliminary result)   Collection Time: 11/12/23  4:43 PM   Specimen: Abscess  Result Value Ref Range Status   Specimen Description ABSCESS  Final   Special Requests INTERVERTEBRAL DISC  Final   Gram Stain NO WBC SEEN NO ORGANISMS SEEN   Final   Culture   Final    NO GROWTH 3 DAYS NO ANAEROBES ISOLATED; CULTURE IN PROGRESS FOR 5 DAYS Performed at [facility], [address], [place], [place] [address]    Report Status PENDING  Incomplete         Radiology Studies: ECHOCARDIOGRAM COMPLETE Result Date: 11/13/2023    ECHOCARDIOGRAM REPORT   Patient Name:   Krystal [name] Diez Date of Exam: 11/13/2023  Medical Rec #:  [id number]          Height:       64.0 in Accession #:    [id number]         Weight:       212.7 lb Date of Birth:  22-May-1986          BSA:          2.008 m Patient Age:  37 years           BP:           127/85 mmHg Patient Gender: F                  HR:           103 bpm. Exam Location:  Inpatient Procedure: 2D Echo, Color Doppler and Cardiac Doppler (Both Spectral and Color            Flow Doppler were utilized during procedure). Indications:    Bacteremia  History:        Patient has no prior history of Echocardiogram examinations.                 Risk Factors:Diabetes.  Sonographer:    Maxwell Marion Referring Phys: [id number] Azucena Fallen IMPRESSIONS  1. Left ventricular ejection fraction, by estimation, is 60 to 65%. The left ventricle has normal function. The left ventricle has no regional wall motion abnormalities. Left ventricular diastolic parameters were normal.  2. Right ventricular systolic function is normal. The right ventricular size is normal.  3. The mitral valve is normal in structure. No evidence of mitral valve regurgitation. No evidence of mitral stenosis.  4. The aortic valve was not well visualized. Aortic valve regurgitation is not visualized. No aortic stenosis is present.  5. The inferior vena cava is normal in size with greater than 50% respiratory variability, suggesting right atrial pressure of 3 mmHg. Comparison(s): No prior Echocardiogram. FINDINGS  Left Ventricle: Left ventricular ejection fraction, by estimation, is 60 to 65%. The left ventricle has normal function. The left ventricle has no regional wall motion abnormalities. Strain was performed and the global longitudinal strain is indeterminate. The left ventricular internal cavity size was normal in size. Suboptimal image quality limits for assessment of left ventricular hypertrophy. Left ventricular diastolic parameters were normal. Right Ventricle: The right ventricular size is normal. No increase in right  ventricular wall thickness. Right ventricular systolic function is normal. Left Atrium: Left atrial size was normal in size. Right Atrium: Right atrial size was normal in size. Pericardium: There is no evidence of pericardial effusion. Mitral Valve: The mitral valve is normal in structure. No evidence of mitral valve regurgitation. No evidence of mitral valve stenosis. Tricuspid Valve: The tricuspid valve is normal in structure. Tricuspid valve regurgitation is not demonstrated. No evidence of tricuspid stenosis. Aortic Valve: The aortic valve was not well visualized. Aortic valve regurgitation is not visualized. No aortic stenosis is present. Aortic valve mean gradient measures 6.0 mmHg. Aortic valve peak gradient measures 15.5 mmHg. Pulmonic Valve: The pulmonic valve was normal in structure. Pulmonic valve regurgitation is not visualized. No evidence of pulmonic stenosis. Aorta: The aortic root, ascending aorta and aortic arch are all structurally normal, with no evidence of dilitation or obstruction. Venous: The inferior vena cava is normal in size with greater than 50% respiratory variability, suggesting right atrial pressure of 3 mmHg. IAS/Shunts: The atrial septum is grossly normal. Additional Comments: 3D was performed not requiring image post processing on an independent workstation and was indeterminate.  LEFT VENTRICLE PLAX 2D LVIDd:         4.30 cm     Diastology LVIDs:         2.90 cm     LV e' medial:  13.50 cm/s LV PW:         1.50 cm     LV e' lateral: 14.00 cm/s LV IVS:  0.80 cm LVOT diam:     2.10 cm LVOT Area:     3.46 cm  LV Volumes (MOD) LV vol d, MOD A2C: 78.7 ml LV vol d, MOD A4C: 88.2 ml LV vol s, MOD A2C: 30.1 ml LV vol s, MOD A4C: 34.7 ml LV SV MOD A2C:     48.6 ml LV SV MOD A4C:     88.2 ml LV SV MOD BP:      51.4 ml RIGHT VENTRICLE RV S prime:     11.70 cm/s TAPSE (M-mode): 1.6 cm LEFT ATRIUM             Index LA diam:        3.90 cm 1.94 cm/m LA Vol (A2C):   28.5 ml 14.19 ml/m LA  Vol (A4C):   36.0 ml 17.92 ml/m LA Biplane Vol: 33.1 ml 16.48 ml/m  AORTIC VALVE AV Vmax:      197.00 cm/s AV Vmean:     115.000 cm/s AV VTI:       0.259 m AV Peak Grad: 15.5 mmHg AV Mean Grad: 6.0 mmHg  AORTA Ao Asc diam: 2.60 cm  SHUNTS Systemic Diam: 2.10 cm Riley Lam MD Electronically signed by Riley Lam MD Signature Date/Time: 11/13/2023/5:46:25 PM    Final         Scheduled Meds:  busPIRone  30 mg Oral BID   vitamin B-12  1,000 mcg Oral Daily   enoxaparin (LOVENOX) injection  50 mg Subcutaneous Q24H   hydrOXYzine  100 mg Oral BID   insulin aspart  0-5 Units Subcutaneous QHS   insulin aspart  0-9 Units Subcutaneous TID WC   insulin glargine-yfgn  17 Units Subcutaneous QHS   metFORMIN  500 mg Oral BID WC   mupirocin ointment  1 Application Nasal BID   polyethylene glycol  17 g Oral BID   prazosin  2 mg Oral QHS   senna-docusate  1 tablet Oral BID   sertraline  100 mg Oral QHS   topiramate  50 mg Oral BID   Continuous Infusions:  cefTRIAXone (ROCEPHIN)  IV 2 g (11/14/23 2212)   DAPTOmycin 700 mg (11/14/23 1436)     LOS: 4 days    Time spent: 35 minutes    Dorcas Carrow, MD [facility]

## 2023-11-15 NOTE — Progress Notes (Signed)
 Mobility Specialist Progress Note:   11/15/23 1000  Mobility  Activity Ambulated independently in hallway  Level of Assistance Standby assist, set-up cues, supervision of patient - no hands on  Assistive Device None  Distance Ambulated (ft) 120 ft  Activity Response Tolerated well  Mobility Referral Yes  Mobility visit 1 Mobility  Mobility Specialist Start Time (ACUTE ONLY) 1032  Mobility Specialist Stop Time (ACUTE ONLY) 1039  Mobility Specialist Time Calculation (min) (ACUTE ONLY) 7 min   Received pt in bed having no complaints and agreeable to mobility. Pt was asymptomatic throughout ambulation and returned to room w/o fault. Left on EOB w/ call bell in reach and all needs met. Officers present.   D'Vante Earlene Plater Mobility Specialist Please contact via Special educational needs teacher or Rehab office at [phone]

## 2023-11-15 NOTE — Plan of Care (Signed)
  Problem: Clinical Measurements: Goal: Ability to maintain clinical measurements within normal limits will improve Outcome: Progressing Goal: Will remain free from infection Outcome: Progressing   Problem: Activity: Goal: Risk for activity intolerance will decrease Outcome: Progressing   Problem: Nutrition: Goal: Adequate nutrition will be maintained Outcome: Progressing   Problem: Elimination: Goal: Will not experience complications related to bowel motility Outcome: Progressing Goal: Will not experience complications related to urinary retention Outcome: Progressing   Problem: Pain Managment: Goal: General experience of comfort will improve and/or be controlled Outcome: Progressing   Problem: Safety: Goal: Ability to remain free from injury will improve Outcome: Progressing   Problem: Skin Integrity: Goal: Risk for impaired skin integrity will decrease Outcome: Progressing

## 2023-11-16 DIAGNOSIS — M4626 Osteomyelitis of vertebra, lumbar region: Secondary | ICD-10-CM | POA: Diagnosis not present

## 2023-11-16 LAB — BASIC METABOLIC PANEL WITH GFR
Anion gap: 12 (ref 5–15)
BUN: 13 mg/dL (ref 6–20)
CO2: 24 mmol/L (ref 22–32)
Calcium: 8.6 mg/dL — ABNORMAL LOW (ref 8.9–10.3)
Chloride: 96 mmol/L — ABNORMAL LOW (ref 98–111)
Creatinine, Ser: 0.79 mg/dL (ref 0.44–1.00)
GFR, Estimated: >60 mL/min (ref >60)
Glucose, Bld: 238 mg/dL — ABNORMAL HIGH (ref 70–99)
Potassium: 3.5 mmol/L (ref 3.5–5.1)
Sodium: 132 mmol/L — ABNORMAL LOW (ref 135–145)

## 2023-11-16 LAB — GLUCOSE, CAPILLARY
Glucose-Capillary: 268 mg/dL — ABNORMAL HIGH (ref 70–99)
Glucose-Capillary: 198 mg/dL — ABNORMAL HIGH (ref 70–99)
Glucose-Capillary: 256 mg/dL — ABNORMAL HIGH (ref 70–99)
Glucose-Capillary: 171 mg/dL — ABNORMAL HIGH (ref 70–99)

## 2023-11-16 LAB — CBC
HCT: 24.7 % — ABNORMAL LOW (ref 36.0–46.0)
Hemoglobin: 7.2 g/dL — ABNORMAL LOW (ref 12.0–15.0)
MCH: 21.8 pg — ABNORMAL LOW (ref 26.0–34.0)
MCHC: 29.1 g/dL — ABNORMAL LOW (ref 30.0–36.0)
MCV: 74.8 fL — ABNORMAL LOW (ref 80.0–100.0)
Platelets: 424 10*3/uL — ABNORMAL HIGH (ref 150–400)
RBC: 3.3 MIL/uL — ABNORMAL LOW (ref 3.87–5.11)
RDW: 14.6 % (ref 11.5–15.5)
WBC: 6.9 10*3/uL (ref 4.0–10.5)
nRBC: 0 % (ref 0.0–0.2)

## 2023-11-16 LAB — CULTURE, BLOOD (ROUTINE X 2)

## 2023-11-16 LAB — SEDIMENTATION RATE: Sed Rate: 129 mm/h — ABNORMAL HIGH (ref 0–22)

## 2023-11-16 LAB — C-REACTIVE PROTEIN: CRP: 11.4 mg/dL — ABNORMAL HIGH (ref <1.0)

## 2023-11-16 MED ORDER — FERROUS SULFATE 325 (65 FE) MG PO TABS
325.0000 mg | ORAL_TABLET | Freq: Three times a day (TID) | ORAL | Status: DC
Start: 2023-11-16 — End: 2023-11-21
  Administered 2023-11-16 – 2023-11-21 (×15): 325 mg via ORAL
  Filled 2023-11-16 (×15): qty 1

## 2023-11-16 NOTE — Plan of Care (Signed)
   Problem: Clinical Measurements: Goal: Diagnostic test results will improve Outcome: Progressing   Problem: Activity: Goal: Risk for activity intolerance will decrease Outcome: Progressing   Problem: Nutrition: Goal: Adequate nutrition will be maintained Outcome: Progressing

## 2023-11-16 NOTE — Progress Notes (Signed)
 PROGRESS NOTE    Krystal [name]  ZOX:[id number] DOB: 20-Dec-1985 DOA: 11/10/2023 PCP: Lorayne Bender, MD    Brief Narrative:  38 year old with type 2 diabetes on insulin, opiate abuse presented from jail with worsening generalized weakness and intermittent low back pain for 6 months.  Hemodynamically stable and neurologically stable in the ER.  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  Admitted with ID consultation.  Subjective: Patient seen and examined.  No overnight events.  Afebrile.  Still has some numbness on the left lateral thigh area.  Able to have good bowel movements.  Walked to the bathroom.   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  Neural foraminal narrowing. Bilateral psoas muscle edema consistent with myositis  Seen by neurosurgery, recommended no surgical intervention. IR aspirated fluid, blood cultures and aspirates are negative so far. TTE with no evidence of endocarditis.  With negative blood cultures, likely does not need TEE. Seen by ID, started on daptomycin and ceftriaxone. With negative cultures recommended 6 weeks of IV antibiotics. Adequate pain medications.  Oral and IV opiates.  Continue mobility.  Chronic microcytic anemia: Supplemental iron.  No evidence of active bleeding.  B12 deficiency: Started supplementation.  Type 2 diabetes: Blood sugars started to elevate.  Start back on long-acting insulin.  Increase to home dose.  Add metformin.  Constipation: Increase dose of MiraLAX to twice daily.  Senokot twice daily. Regulated now.  Disposition: Medically stable.  Needs 6 weeks of IV antibiotics.  Need to figure out logistics of receiving antibiotics in the prision before discharge.   DVT prophylaxis: SCDs Start: 11/11/23 0158   Code Status: Full code Family Communication: None at  the bedside. Disposition Plan: Status is: Inpatient Remains inpatient appropriate because: IV antibiotics     Consultants:  Infectious disease Neurosurgery  Procedures:  IR guided aspiration  Antimicrobials:  Daptomycin and ceftriaxone 4/8     Objective: Vitals:   11/15/23 2219 11/16/23 0501 11/16/23 0618 11/16/23 0818  BP: (!) 140/83 130/87  94/62  Pulse: 98 (!) 104  96  Resp: 18 17  19   Temp: 98.8 F (37.1 C) 98.5 F (36.9 C)  97.8 F (36.6 C)  TempSrc: Oral Oral  Oral  SpO2: 99% 97%  97%  Weight:   97.9 kg   Height:        Intake/Output Summary (Last 24 hours) at 11/16/2023 1338 Last data filed at 11/15/2023 1700 Gross per 24 hour  Intake 237 ml  Output --  Net 237 ml   Filed Weights   11/11/23 0319 11/12/23 0500 11/16/23 0618  Weight: 102.5 kg 96.5 kg 97.9 kg    Examination:  General exam: Appears calm and comfortable  Respiratory system: Clear to auscultation. Respiratory effort normal. Cardiovascular system: S1 & S2 heard, RRR. Marland Kitchen Gastrointestinal system: Soft.  Nontender.  Bowel sound present. Central nervous system: Alert and oriented. No focal neurological deficits. Extremities: Symmetric 5 x 5 power. Skin: Patient does have loss of superficial sensation on L2/L3 dermatome distribution. She has equal motor strength and normal reflexes in both extremities.   Data Reviewed: I have personally reviewed following labs and imaging studies  CBC: Recent Labs  Lab 11/10/23 1632 11/11/23 0627 11/12/23 0612 11/14/23 0541 11/16/23 0426  WBC 8.4 7.6 8.5 9.3 6.9  NEUTROABS 6.4 5.1  --   --   --   HGB 8.1* 7.5*  7.8* 7.7* 7.2*  HCT 27.5* 25.2* 26.6* 25.7* 24.7*  MCV 74.5* 74.8* 75.1* 73.4* 74.8*  PLT 574* 440* 476* 458* 424*   Basic Metabolic Panel: Recent Labs  Lab 11/10/23 1632 11/11/23 0627 11/12/23 0612 11/14/23 0541 11/16/23 0426  NA 135 135 134* 135 132*  K 3.8 3.7 3.9 4.0 3.5  CL 101 104 101 100 96*  CO2 21* 21* 23 25 24   GLUCOSE  119* 94 170* 201* 238*  BUN 11 8 14 13 13   CREATININE 0.91 0.99 0.87 0.75 0.79  CALCIUM 9.1 8.6* 8.8* 8.6* 8.6*  MG 2.0 1.9  --   --   --    GFR: Estimated Creatinine Clearance: 111.6 mL/min (by C-G formula based on SCr of 0.79 mg/dL). Liver Function Tests: Recent Labs  Lab 11/10/23 1632 11/11/23 0627  AST 9* 8*  ALT 9 8  ALKPHOS 73 65  BILITOT 0.2 0.5  PROT 8.3* 7.1  ALBUMIN 2.6* 2.3*   Recent Labs  Lab 11/10/23 1632  LIPASE 22   No results for input(s): "AMMONIA" in the last 168 hours. Coagulation Profile: Recent Labs  Lab 11/11/23 0627  INR 1.2   Cardiac Enzymes: Recent Labs  Lab 11/11/23 0627 11/13/23 0637  CKTOTAL 11* 11*   BNP (last 3 results) No results for input(s): "PROBNP" in the last 8760 hours. HbA1C: No results for input(s): "HGBA1C" in the last 72 hours. CBG: Recent Labs  Lab 11/15/23 1156 11/15/23 1705 11/15/23 2238 11/16/23 0621 11/16/23 1203  GLUCAP 277* 152* 225* 268* 198*   Lipid Profile: No results for input(s): "CHOL", "HDL", "LDLCALC", "TRIG", "CHOLHDL", "LDLDIRECT" in the last 72 hours. Thyroid Function Tests: No results for input(s): "TSH", "T4TOTAL", "FREET4", "T3FREE", "THYROIDAB" in the last 72 hours. Anemia Panel: No results for input(s): "VITAMINB12", "FOLATE", "FERRITIN", "TIBC", "IRON", "RETICCTPCT" in the last 72 hours. Sepsis Labs: Recent Labs  Lab 11/10/23 2353 11/11/23 0104  LATICACIDVEN 0.8 0.4*    Recent Results (from the past 240 hours)  Resp panel by RT-PCR (RSV, Flu A&B, Covid) Anterior Nasal Swab     Status: None   Collection Time: 11/10/23  4:15 PM   Specimen: Anterior Nasal Swab  Result Value Ref Range Status   SARS Coronavirus 2 by RT PCR NEGATIVE NEGATIVE Final   Influenza A by PCR NEGATIVE NEGATIVE Final   Influenza B by PCR NEGATIVE NEGATIVE Final    Comment: (NOTE) The Xpert Xpress SARS-CoV-2/FLU/RSV plus assay is intended as an aid in the diagnosis of influenza from Nasopharyngeal swab  specimens and should not be used as a sole basis for treatment. Nasal washings and aspirates are unacceptable for Xpert Xpress SARS-CoV-2/FLU/RSV testing.  Fact Sheet for Patients: BloggerCourse.com  Fact Sheet for Healthcare Providers: SeriousBroker.it  This test is not yet approved or cleared by the [place] FDA and has been authorized for detection and/or diagnosis of SARS-CoV-2 by FDA under an Emergency Use Authorization (EUA). This EUA will remain in effect (meaning this test can be used) for the duration of the COVID-19 declaration under Section 564(b)(1) of the Act, 21 U.S.C. section 360bbb-3(b)(1), unless the authorization is terminated or revoked.     Resp Syncytial Virus by PCR NEGATIVE NEGATIVE Final    Comment: (NOTE) Fact Sheet for Patients: BloggerCourse.com  Fact Sheet for Healthcare Providers: SeriousBroker.it  This test is not yet approved or cleared by the [place] FDA and has been authorized for detection and/or diagnosis of SARS-CoV-2 by FDA under an Emergency Use Authorization (EUA). This EUA  will remain in effect (meaning this test can be used) for the duration of the COVID-19 declaration under Section 564(b)(1) of the Act, 21 U.S.C. section 360bbb-3(b)(1), unless the authorization is terminated or revoked.  Performed at [facility], [address], [place], [place] [address]   Blood culture (routine x 2)     Status: None   Collection Time: 11/10/23 11:03 PM   Specimen: BLOOD  Result Value Ref Range Status   Specimen Description BLOOD LEFT ANTECUBITAL  Final   Special Requests   Final    BOTTLES DRAWN AEROBIC AND ANAEROBIC Blood Culture adequate volume   Culture   Final    NO GROWTH 5 DAYS Performed at [facility], [address] [place], [place] [address]    Report Status 11/15/2023 FINAL  Final  Blood culture (routine x  2)     Status: None   Collection Time: 11/11/23 12:57 AM   Specimen: BLOOD RIGHT HAND  Result Value Ref Range Status   Specimen Description BLOOD RIGHT HAND  Final   Special Requests   Final    BOTTLES DRAWN AEROBIC AND ANAEROBIC Blood Culture results may not be optimal due to an inadequate volume of blood received in culture bottles   Culture   Final    NO GROWTH 5 DAYS Performed at [facility], [address] [place], [place] [address]    Report Status 11/16/2023 FINAL  Final  MRSA Next Gen by PCR, Nasal     Status: Abnormal   Collection Time: 11/11/23  3:40 AM   Specimen: Nasal Mucosa; Nasal Swab  Result Value Ref Range Status   MRSA by PCR Next Gen DETECTED (A) NOT DETECTED Final    Comment: RESULT CALLED TO, READ BACK BY AND VERIFIED WITH: RN Lydia on [phone] @0925  by SM (NOTE) The GeneXpert MRSA Assay (FDA approved for NASAL specimens only), is one component of a comprehensive MRSA colonization surveillance program. It is not intended to diagnose MRSA infection nor to guide or monitor treatment for MRSA infections. Test performance is not FDA approved in patients less than 23 years old. Performed at [facility], [address], [place], [place] [address]   Aerobic/Anaerobic Culture w Gram Stain (surgical/deep wound)     Status: None (Preliminary result)   Collection Time: 11/12/23  4:43 PM   Specimen: Abscess  Result Value Ref Range Status   Specimen Description ABSCESS  Final   Special Requests INTERVERTEBRAL DISC  Final   Gram Stain NO WBC SEEN NO ORGANISMS SEEN   Final   Culture   Final    NO GROWTH 4 DAYS NO ANAEROBES ISOLATED; CULTURE IN PROGRESS FOR 5 DAYS Performed at [facility], [address], [place], [place] [address]    Report Status PENDING  Incomplete  Fungus Culture With Stain     Status: None (Preliminary result)   Collection Time: 11/12/23  4:43 PM  Result Value Ref Range Status   Fungus Stain Final report  Final    Comment:  (NOTE) Performed At: [facility] [address] [place], [place] [id number] Jolene Schimke MD MW:[id number]    Fungus (Mycology) Culture PENDING  Incomplete   Fungal Source ABSCESS  Final    Comment: INTERVERTEBRAL Performed at [facility], [address], [place], [place] [address]   Fungus Culture Result     Status: None   Collection Time: 11/12/23  4:43 PM  Result Value Ref Range  Status   Result 1 Comment  Final    Comment: (NOTE) KOH/Calcofluor preparation:  no fungus observed. Performed At: [facility] [address] [place], [place] [id number] Jolene Schimke MD WU:[id number]          Radiology Studies: No results found.       Scheduled Meds:  busPIRone  30 mg Oral BID   vitamin B-12  1,000 mcg Oral Daily   enoxaparin (LOVENOX) injection  50 mg Subcutaneous Q24H   hydrOXYzine  100 mg Oral BID   insulin aspart  0-5 Units Subcutaneous QHS   insulin aspart  0-9 Units Subcutaneous TID WC   insulin glargine-yfgn  17 Units Subcutaneous QHS   metFORMIN  500 mg Oral BID WC   polyethylene glycol  17 g Oral BID   prazosin  2 mg Oral QHS   senna-docusate  1 tablet Oral BID   sertraline  100 mg Oral QHS   topiramate  50 mg Oral BID   Continuous Infusions:  cefTRIAXone (ROCEPHIN)  IV 2 g (11/15/23 2041)   DAPTOmycin 700 mg (11/15/23 1449)     LOS: 5 days    Time spent: 35 minutes    Dorcas Carrow, MD [facility]

## 2023-11-16 NOTE — Progress Notes (Signed)
 Physical Therapy Treatment Patient Details Name: Krystal [name] MRN: [id number] DOB: 1985-09-18 Today's Date: 11/16/2023   History of Present Illness Patient is a 38 yo female presenting with diarrhea, generalized weakness and low back pain on 11/10/23. 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; L4-5 disc aspiration 4/7; PMH includes: DM2    PT Comments  Patient continues to mobilize at supervision level. Cues provided for log roll to facilitate back comfort with transitional movement. Pt reliant on wide stance for power up to stand and has tendency to lean posteriorly during rise. Pt unable to stand with more normalized BOS and reliant on hands via "gowers" type maneuver to fully rise secondary to bil hip and quad weakness. Pt ambulated ~300' with no AD and with IV pole, educated on safe positioning of IV pole with Rt UE support. EOS attempted sit<>stands for LE strengthening, pt slow to complete 5 reps. Pt requesting return to supine in bed and requesting pain meds. EOS pt in bed, call bell in reach, and RN notified of pain med request. Will continue to progress as able.     If plan is discharge home, recommend the following:     Can travel by private vehicle        Equipment Recommendations  None recommended by PT    Recommendations for Other Services       Precautions / Restrictions Precautions Precautions: None Recall of Precautions/Restrictions: Intact Restrictions Weight Bearing Restrictions Per Provider Order: No     Mobility  Bed Mobility Overal bed mobility: Needs Assistance Bed Mobility: Supine to Sit     Supine to sit: Contact guard     General bed mobility comments: cues for log roll for comfort. extra time and use of bed features    Transfers Overall transfer level: Needs assistance Equipment used: None Transfers: Sit to/from Stand, Bed to chair/wheelchair/BSC Sit to Stand: Supervision   Step pivot transfers:  Supervision       General transfer comment: wide stance/BOS and heavy use of UE's to power up.    Ambulation/Gait Ambulation/Gait assistance: Supervision Gait Distance (Feet): 300 Feet Assistive device: None, IV Pole Gait Pattern/deviations: Step-through pattern, Decreased stride length, Wide base of support Gait velocity: decreased     General Gait Details: Inefficient gait with wide step width and decreased step length, overall steady with no physical assist needed. cues for management of IV pole.   Stairs             Wheelchair Mobility     Tilt Bed    Modified Rankin (Stroke Patients Only)       Balance Overall balance assessment: Mild deficits observed, not formally tested                                          Communication Communication Communication: No apparent difficulties  Cognition Arousal: Alert Behavior During Therapy: Flat affect   PT - Cognitive impairments: No apparent impairments                         Following commands: Intact      Cueing Cueing Techniques: Verbal cues  Exercises      General Comments        Pertinent Vitals/Pain Pain Assessment Pain Assessment: Faces Faces Pain Scale: Hurts little more Pain Location: low back with transitional movements Pain  Descriptors / Indicators: Grimacing, Guarding, Discomfort Pain Intervention(s): Limited activity within patient's tolerance, Monitored during session, Repositioned, Patient requesting pain meds-RN notified    Home Living                          Prior Function            PT Goals (current goals can now be found in the care plan section) Acute Rehab PT Goals Patient Stated Goal: decrease pain PT Goal Formulation: With patient Time For Goal Achievement: 11/25/23 Potential to Achieve Goals: Good Progress towards PT goals: Progressing toward goals    Frequency    Min 1X/week      PT Plan      Co-evaluation               AM-PAC PT "6 Clicks" Mobility   Outcome Measure  Help needed turning from your back to your side while in a flat bed without using bedrails?: None Help needed moving from lying on your back to sitting on the side of a flat bed without using bedrails?: A Little Help needed moving to and from a bed to a chair (including a wheelchair)?: A Little Help needed standing up from a chair using your arms (e.g., wheelchair or bedside chair)?: A Little Help needed to walk in hospital room?: A Little Help needed climbing 3-5 steps with a railing? : A Little 6 Click Score: 19    End of Session Equipment Utilized During Treatment: Gait belt Activity Tolerance: Patient tolerated treatment well Patient left: in bed;with call bell/phone within reach;Other (comment) (police officers present in room) Nurse Communication: Mobility status PT Visit Diagnosis: Difficulty in walking, not elsewhere classified (R26.2);Pain     Time: 1610-9604 PT Time Calculation (min) (ACUTE ONLY): 18 min  Charges:    $Gait Training: 8-22 mins PT General Charges $$ ACUTE PT VISIT: 1 Visit                     Wynn Maudlin, DPT Acute Rehabilitation Services Office [phone]  11/16/23 4:44 PM

## 2023-11-16 NOTE — TOC Progression Note (Signed)
 Transition of Care (TOC) - Progression Note  Donn Pierini RN, BSN Transitions of Care Unit 4E- RN Case Manager See Treatment Team for direct phone # 5N cross coverage  Patient Details  Name: Krystal [name] MRN: [id number] Date of Birth: 1986-04-17  Transition of Care [facility]) CM/SW Contact  Zenda Alpers Lenn Sink, RN Phone Number: 11/16/2023, 1:54 PM  Clinical Narrative:    Per officers at bedside- phone # to Medical officer at the jail is- [phone].   Call made to the medical unit at the jail to see about transition plan. Spoke with [employer]. Info provided to [facility] on pt's needs for 6wks of IV abx (Daptomycin/Ceftriaxone). Ebony to speak with medical supervisors and have someone return call -pt will not be able to return to [facility] jail as they can not meet medical needs, however they will need to see if there is a medical unit that they can transfer her to that can meet the medical needs vs pt staying here in house.   Per Karel Jarvis Joni Reining or [place]) should be returning call.    Expected Discharge Plan: [facility] Barriers to Discharge: Continued Medical Work up  Expected Discharge Plan and Services   Discharge Planning Services: CM Consult                                           Social Determinants of Health (SDOH) Interventions SDOH Screenings   Food Insecurity: No Food Insecurity (11/11/2023)  Housing: Low Risk  (11/11/2023)  Transportation Needs: No Transportation Needs (11/11/2023)  Utilities: Not At Risk (11/11/2023)  Alcohol Screen: Low Risk  (05/17/2020)  Depression (PHQ2-9): Medium Risk (10/19/2021)  Tobacco Use: High Risk (11/11/2023)    Readmission Risk Interventions     No data to display

## 2023-11-16 NOTE — Progress Notes (Signed)
 Mobility Specialist Progress Note:    11/16/23 1300  Mobility  Activity Ambulated independently in hallway  Level of Assistance Standby assist, set-up cues, supervision of patient - no hands on  Assistive Device None  Distance Ambulated (ft) 140 ft  Activity Response Tolerated well  Mobility Referral Yes  Mobility visit 1 Mobility  Mobility Specialist Start Time (ACUTE ONLY) 1036  Mobility Specialist Stop Time (ACUTE ONLY) 1045  Mobility Specialist Time Calculation (min) (ACUTE ONLY) 9 min   Pt received in bed and agreeable. C/o L thigh numbness and low back pain. Required increased time to come EOB and stand. Voided in BR before ambulation. Asymptomatic throughout. Pt left in bed with call bell and all needs met. Officers present.  Krystal [name] Mobility Specialist Please contact via Special educational needs teacher or Rehab office at [phone]

## 2023-11-17 DIAGNOSIS — Z419 Encounter for procedure for purposes other than remedying health state, unspecified: Secondary | ICD-10-CM | POA: Diagnosis not present

## 2023-11-17 DIAGNOSIS — M4626 Osteomyelitis of vertebra, lumbar region: Secondary | ICD-10-CM | POA: Diagnosis not present

## 2023-11-17 LAB — GLUCOSE, CAPILLARY
Glucose-Capillary: 178 mg/dL — ABNORMAL HIGH (ref 70–99)
Glucose-Capillary: 222 mg/dL — ABNORMAL HIGH (ref 70–99)
Glucose-Capillary: 181 mg/dL — ABNORMAL HIGH (ref 70–99)
Glucose-Capillary: 189 mg/dL — ABNORMAL HIGH (ref 70–99)

## 2023-11-17 LAB — AEROBIC/ANAEROBIC CULTURE W GRAM STAIN (SURGICAL/DEEP WOUND)
Culture: NO GROWTH
Gram Stain: NONE SEEN

## 2023-11-17 MED ORDER — INSULIN GLARGINE-YFGN 100 UNIT/ML ~~LOC~~ SOLN
20.0000 [IU] | Freq: Every day | SUBCUTANEOUS | Status: DC
  Administered 2023-11-17 – 2023-11-20 (×4): 20 [IU] via SUBCUTANEOUS
  Filled 2023-11-17 (×5): qty 0.2

## 2023-11-17 MED ORDER — METHOCARBAMOL 500 MG PO TABS
1000.0000 mg | ORAL_TABLET | Freq: Three times a day (TID) | ORAL | Status: DC
  Administered 2023-11-17 – 2023-11-21 (×13): 1000 mg via ORAL
  Filled 2023-11-17 (×13): qty 2

## 2023-11-17 MED ORDER — GABAPENTIN 100 MG PO CAPS
100.0000 mg | ORAL_CAPSULE | Freq: Three times a day (TID) | ORAL | Status: DC
Start: 2023-11-17 — End: 2023-11-18
  Administered 2023-11-17 (×3): 100 mg via ORAL
  Filled 2023-11-17 (×3): qty 1

## 2023-11-17 NOTE — Progress Notes (Signed)
 Mobility Specialist: Progress Note   11/17/23 1311  Mobility  Activity Ambulated with assistance in hallway  Level of Assistance Standby assist, set-up cues, supervision of patient - no hands on  Assistive Device None  Distance Ambulated (ft) 150 ft  Activity Response Tolerated well  Mobility Referral Yes  Mobility visit 1 Mobility  Mobility Specialist Start Time (ACUTE ONLY) 1250  Mobility Specialist Stop Time (ACUTE ONLY) 1256  Mobility Specialist Time Calculation (min) (ACUTE ONLY) 6 min    Pt agreeable to mobility session - received ambulating from BR. SV throughout. C/o pain in low back and thighs. Returned to room without fault. Left in chair with all needs met, call bell in reach.   Deloria Fetch Mobility Specialist Please contact via SecureChat or Rehab office at [phone]

## 2023-11-17 NOTE — Plan of Care (Signed)
   Problem: Activity: Goal: Risk for activity intolerance will decrease Outcome: Progressing   Problem: Coping: Goal: Level of anxiety will decrease Outcome: Progressing

## 2023-11-17 NOTE — Progress Notes (Signed)
 PROGRESS NOTE    Krystal [name]  WJX:[id number] DOB: [birth year] DOA: 11/10/2023 PCP: Naida Austria, MD    Brief Narrative:  38 year old with type 2 diabetes on insulin, opiate abuse presented from jail with worsening generalized weakness and intermittent low back pain for 6 months.  Hemodynamically stable and neurologically stable in the ER.  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  Admitted with ID consultation.  Subjective: Patient seen and examined.  Afebrile.  No other overnight events.  Persistent pain both hips which patient thinks may be related to weaning off pain medications.  Urine and bowel habits are normal.  Numbness on the lateral left thigh is now more of a pain.  Walking around.   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  Neural foraminal narrowing. Bilateral psoas muscle edema consistent with myositis  Seen by neurosurgery, recommended no surgical intervention. IR aspirated fluid, blood cultures and aspirates are negative so far. TTE with no evidence of endocarditis.  With negative blood cultures, does not need TEE. Seen by ID, started on daptomycin and ceftriaxone. With negative cultures recommended 6 weeks of IV antibiotics. Adequate pain medications.   Discontinue IV opiates. Pain management with oral opiates, add Robaxin 1000 mg 3 times daily, gabapentin 100 mg 3 times daily today.  Will continue to increase dose of gabapentin.  Chronic microcytic anemia: Supplemental iron.  No evidence of active bleeding.  B12 deficiency: Started supplementation.  Type 2 diabetes: Blood sugars started to elevate.  Increase dose of long-acting insulin to 20 units today.  Added metformin.  Constipation: Increase dose of MiraLAX to twice daily.  Senokot twice daily. Regulated now.  Disposition: Medically stable.   Needs 6 weeks of IV antibiotics.  Need to figure out logistics of receiving antibiotics in the prision before discharge.   DVT prophylaxis: SCDs Start: 11/11/23 0158   Code Status: Full code Family Communication: None at the bedside.  Security personnel at the bedside. Disposition Plan: Status is: Inpatient Remains inpatient appropriate because: IV antibiotics     Consultants:  Infectious disease Neurosurgery  Procedures:  IR guided aspiration  Antimicrobials:  Daptomycin and ceftriaxone 4/8     Objective: Vitals:   11/16/23 1613 11/16/23 2052 11/17/23 0601 11/17/23 0844  BP: 133/86 123/78 115/75 (!) 132/95  Pulse: 95 99 97 99  Resp: 16 17 18 17   Temp: 98.1 F (36.7 C) 97.9 F (36.6 C) 98.7 F (37.1 C) 98.2 F (36.8 C)  TempSrc: Oral  Oral Oral  SpO2: 98% 98% 100% 97%  Weight:      Height:        Intake/Output Summary (Last 24 hours) at 11/17/2023 1456 Last data filed at 11/17/2023 1300 Gross per 24 hour  Intake 840 ml  Output 1200 ml  Net -360 ml   Filed Weights   11/11/23 0319 11/12/23 0500 11/16/23 0618  Weight: 102.5 kg 96.5 kg 97.9 kg    Examination:  General exam: Appears calm and comfortable  Respiratory system: Clear to auscultation. Respiratory effort normal. Cardiovascular system: S1 & S2 heard, RRR. Aaron Aas Gastrointestinal system: Soft.  Nontender.  Bowel sound present. Central nervous system: Alert and oriented. No focal neurological deficits. Extremities: Symmetric 5 x 5 power. Skin: Patient does have loss of superficial sensation on L2/L3 dermatome distribution. She has equal motor strength and normal reflexes in both extremities.   Data Reviewed: I have  personally reviewed following labs and imaging studies  CBC: Recent Labs  Lab 11/10/23 1632 11/11/23 0627 11/12/23 0612 11/14/23 0541 11/16/23 0426  WBC 8.4 7.6 8.5 9.3 6.9  NEUTROABS 6.4 5.1  --   --   --   HGB 8.1* 7.5* 7.8* 7.7* 7.2*  HCT 27.5* 25.2* 26.6* 25.7* 24.7*  MCV  74.5* 74.8* 75.1* 73.4* 74.8*  PLT 574* 440* 476* 458* 424*   Basic Metabolic Panel: Recent Labs  Lab 11/10/23 1632 11/11/23 0627 11/12/23 0612 11/14/23 0541 11/16/23 0426  NA 135 135 134* 135 132*  K 3.8 3.7 3.9 4.0 3.5  CL 101 104 101 100 96*  CO2 21* 21* 23 25 24   GLUCOSE 119* 94 170* 201* 238*  BUN 11 8 14 13 13   CREATININE 0.91 0.99 0.87 0.75 0.79  CALCIUM 9.1 8.6* 8.8* 8.6* 8.6*  MG 2.0 1.9  --   --   --    GFR: Estimated Creatinine Clearance: 111.6 mL/min (by C-G formula based on SCr of 0.79 mg/dL). Liver Function Tests: Recent Labs  Lab 11/10/23 1632 11/11/23 0627  AST 9* 8*  ALT 9 8  ALKPHOS 73 65  BILITOT 0.2 0.5  PROT 8.3* 7.1  ALBUMIN 2.6* 2.3*   Recent Labs  Lab 11/10/23 1632  LIPASE 22   No results for input(s): "AMMONIA" in the last 168 hours. Coagulation Profile: Recent Labs  Lab 11/11/23 0627  INR 1.2   Cardiac Enzymes: Recent Labs  Lab 11/11/23 0627 11/13/23 0637  CKTOTAL 11* 11*   BNP (last 3 results) No results for input(s): "PROBNP" in the last 8760 hours. HbA1C: No results for input(s): "HGBA1C" in the last 72 hours. CBG: Recent Labs  Lab 11/16/23 1203 11/16/23 1642 11/16/23 2242 11/17/23 0603 11/17/23 1143  GLUCAP 198* 256* 171* 178* 222*   Lipid Profile: No results for input(s): "CHOL", "HDL", "LDLCALC", "TRIG", "CHOLHDL", "LDLDIRECT" in the last 72 hours. Thyroid Function Tests: No results for input(s): "TSH", "T4TOTAL", "FREET4", "T3FREE", "THYROIDAB" in the last 72 hours. Anemia Panel: No results for input(s): "VITAMINB12", "FOLATE", "FERRITIN", "TIBC", "IRON", "RETICCTPCT" in the last 72 hours. Sepsis Labs: Recent Labs  Lab 11/10/23 2353 11/11/23 0104  LATICACIDVEN 0.8 0.4*    Recent Results (from the past 240 hours)  Resp panel by RT-PCR (RSV, Flu A&B, Covid) Anterior Nasal Swab     Status: None   Collection Time: 11/10/23  4:15 PM   Specimen: Anterior Nasal Swab  Result Value Ref Range Status   SARS  Coronavirus 2 by RT PCR NEGATIVE NEGATIVE Final   Influenza A by PCR NEGATIVE NEGATIVE Final   Influenza B by PCR NEGATIVE NEGATIVE Final    Comment: (NOTE) The Xpert Xpress SARS-CoV-2/FLU/RSV plus assay is intended as an aid in the diagnosis of influenza from Nasopharyngeal swab specimens and should not be used as a sole basis for treatment. Nasal washings and aspirates are unacceptable for Xpert Xpress SARS-CoV-2/FLU/RSV testing.  Fact Sheet for Patients: BloggerCourse.com  Fact Sheet for Healthcare Providers: SeriousBroker.it  This test is not yet approved or cleared by the [place]  FDA and has been authorized for detection and/or diagnosis of SARS-CoV-2 by FDA under an Emergency Use Authorization (EUA). This EUA will remain in effect (meaning this test can be used) for the duration of the COVID-19 declaration under Section 564(b)(1) of the Act, 21 U.S.C. section 360bbb-3(b)(1), unless the authorization is terminated or revoked.     Resp Syncytial Virus by PCR NEGATIVE NEGATIVE Final    Comment: (  NOTE) Fact Sheet for Patients: BloggerCourse.com  Fact Sheet for Healthcare Providers: SeriousBroker.it  This test is not yet approved or cleared by the [place]  FDA and has been authorized for detection and/or diagnosis of SARS-CoV-2 by FDA under an Emergency Use Authorization (EUA). This EUA will remain in effect (meaning this test can be used) for the duration of the COVID-19 declaration under Section 564(b)(1) of the Act, 21 U.S.C. section 360bbb-3(b)(1), unless the authorization is terminated or revoked.  Performed at [facility], [address] [place], [place] [address]   Blood culture (routine x 2)     Status: None   Collection Time: 11/10/23 11:03 PM   Specimen: BLOOD  Result Value Ref Range Status   Specimen Description BLOOD LEFT ANTECUBITAL  Final    Special Requests   Final    BOTTLES DRAWN AEROBIC AND ANAEROBIC Blood Culture adequate volume   Culture   Final    NO GROWTH 5 DAYS Performed at [facility], [address], [place], [place] [address]    Report Status 11/15/2023 FINAL  Final  Blood culture (routine x 2)     Status: None   Collection Time: 11/11/23 12:57 AM   Specimen: BLOOD RIGHT HAND  Result Value Ref Range Status   Specimen Description BLOOD RIGHT HAND  Final   Special Requests   Final    BOTTLES DRAWN AEROBIC AND ANAEROBIC Blood Culture results may not be optimal due to an inadequate volume of blood received in culture bottles   Culture   Final    NO GROWTH 5 DAYS Performed at [facility], [address], [place], [place] [address]    Report Status 11/16/2023 FINAL  Final  MRSA Next Gen by PCR, Nasal     Status: Abnormal   Collection Time: 11/11/23  3:40 AM   Specimen: Nasal Mucosa; Nasal Swab  Result Value Ref Range Status   MRSA by PCR Next Gen DETECTED (A) NOT DETECTED Final    Comment: RESULT CALLED TO, READ BACK BY AND VERIFIED WITH: RN Lydia on [phone] @0925  by SM (NOTE) The GeneXpert MRSA Assay (FDA approved for NASAL specimens only), is one component of a comprehensive MRSA colonization surveillance program. It is not intended to diagnose MRSA infection nor to guide or monitor treatment for MRSA infections. Test performance is not FDA approved in patients less than 27 years old. Performed at [facility], [address] [place], [place] [address]   Aerobic/Anaerobic Culture w Gram Stain (surgical/deep wound)     Status: None (Preliminary result)   Collection Time: 11/12/23  4:43 PM   Specimen: Abscess  Result Value Ref Range Status   Specimen Description ABSCESS  Final   Special Requests INTERVERTEBRAL DISC  Final   Gram Stain NO WBC SEEN NO ORGANISMS SEEN   Final   Culture   Final    NO GROWTH 4 DAYS NO ANAEROBES ISOLATED; CULTURE IN PROGRESS FOR 5 DAYS Performed at  [facility], [address], [place], [place] [address]    Report Status PENDING  Incomplete  Fungus Culture With Stain     Status: None (Preliminary result)   Collection Time: 11/12/23  4:43 PM  Result Value Ref Range Status   Fungus Stain Final report  Final    Comment: (NOTE) Performed At: [facility] [address] [place], [place] [id number] Pearlean Botts MD [id number]    Fungus (Mycology) Culture PENDING  Incomplete  Fungal Source ABSCESS  Final    Comment: INTERVERTEBRAL Performed at [facility], [address], [place], [place] [address]   Fungus Culture Result     Status: None   Collection Time: 11/12/23  4:43 PM  Result Value Ref Range Status   Result 1 Comment  Final    Comment: (NOTE) KOH/Calcofluor preparation:  no fungus observed. Performed At: [facility] [address] [place], [place] [id number] Pearlean Botts MD [id number]          Radiology Studies: No results found.       Scheduled Meds:  busPIRone  30 mg Oral BID   vitamin B-12  1,000 mcg Oral Daily   enoxaparin (LOVENOX) injection  50 mg Subcutaneous Q24H   ferrous sulfate  325 mg Oral TID WC   gabapentin  100 mg Oral TID   hydrOXYzine  100 mg Oral BID   insulin aspart  0-5 Units Subcutaneous QHS   insulin aspart  0-9 Units Subcutaneous TID WC   insulin glargine-yfgn  20 Units Subcutaneous QHS   metFORMIN  500 mg Oral BID WC   methocarbamol  1,000 mg Oral TID   polyethylene glycol  17 g Oral BID   prazosin  2 mg Oral QHS   senna-docusate  1 tablet Oral BID   sertraline  100 mg Oral QHS   topiramate  50 mg Oral BID   Continuous Infusions:  cefTRIAXone (ROCEPHIN)  IV 2 g (11/16/23 2041)   DAPTOmycin 700 mg (11/17/23 1344)     LOS: 6 days    Time spent: 35 minutes    Vada Garibaldi, MD [facility]

## 2023-11-18 DIAGNOSIS — M4626 Osteomyelitis of vertebra, lumbar region: Secondary | ICD-10-CM | POA: Diagnosis not present

## 2023-11-18 LAB — CBC
HCT: 24.4 % — ABNORMAL LOW (ref 36.0–46.0)
Hemoglobin: 7 g/dL — ABNORMAL LOW (ref 12.0–15.0)
MCH: 21.5 pg — ABNORMAL LOW (ref 26.0–34.0)
MCHC: 28.7 g/dL — ABNORMAL LOW (ref 30.0–36.0)
MCV: 74.8 fL — ABNORMAL LOW (ref 80.0–100.0)
Platelets: 429 10*3/uL — ABNORMAL HIGH (ref 150–400)
RBC: 3.26 MIL/uL — ABNORMAL LOW (ref 3.87–5.11)
RDW: 14.9 % (ref 11.5–15.5)
WBC: 6.8 10*3/uL (ref 4.0–10.5)
nRBC: 0 % (ref 0.0–0.2)

## 2023-11-18 LAB — GLUCOSE, CAPILLARY
Glucose-Capillary: 134 mg/dL — ABNORMAL HIGH (ref 70–99)
Glucose-Capillary: 175 mg/dL — ABNORMAL HIGH (ref 70–99)
Glucose-Capillary: 186 mg/dL — ABNORMAL HIGH (ref 70–99)
Glucose-Capillary: 203 mg/dL — ABNORMAL HIGH (ref 70–99)
Glucose-Capillary: 210 mg/dL — ABNORMAL HIGH (ref 70–99)
Glucose-Capillary: 213 mg/dL — ABNORMAL HIGH (ref 70–99)

## 2023-11-18 LAB — BASIC METABOLIC PANEL WITH GFR
Anion gap: 11 (ref 5–15)
BUN: 14 mg/dL (ref 6–20)
CO2: 26 mmol/L (ref 22–32)
Calcium: 8.5 mg/dL — ABNORMAL LOW (ref 8.9–10.3)
Chloride: 96 mmol/L — ABNORMAL LOW (ref 98–111)
Creatinine, Ser: 0.76 mg/dL (ref 0.44–1.00)
GFR, Estimated: >60 mL/min (ref >60)
Glucose, Bld: 171 mg/dL — ABNORMAL HIGH (ref 70–99)
Potassium: 4.1 mmol/L (ref 3.5–5.1)
Sodium: 133 mmol/L — ABNORMAL LOW (ref 135–145)

## 2023-11-18 MED ORDER — GABAPENTIN 100 MG PO CAPS
200.0000 mg | ORAL_CAPSULE | Freq: Three times a day (TID) | ORAL | Status: DC
  Administered 2023-11-18 – 2023-11-20 (×7): 200 mg via ORAL
  Filled 2023-11-18 (×7): qty 2

## 2023-11-18 NOTE — Plan of Care (Signed)
  Problem: Pain Managment: Goal: General experience of comfort will improve and/or be controlled Outcome: Progressing   Problem: Safety: Goal: Ability to remain free from injury will improve Outcome: Progressing

## 2023-11-18 NOTE — Progress Notes (Signed)
 PROGRESS NOTE    Krystal [name]  WUJ:[id number] DOB: 02/20/1986 DOA: 11/10/2023 PCP: Naida Austria, MD    Brief Narrative:  38 year old with type 2 diabetes on insulin, opiate abuse presented from jail with worsening generalized weakness and intermittent low back pain for 6 months.  Hemodynamically stable and neurologically stable in the ER.  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  Admitted with ID consultation.  Subjective: Seen and examined.  No overnight events.  Increasing dose of gabapentin might have helped.   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  Neural foraminal narrowing. Bilateral psoas muscle edema consistent with myositis  Seen by neurosurgery, recommended no surgical intervention. IR aspirated fluid, blood cultures and aspirates are negative so far. TTE with no evidence of endocarditis.  With negative blood cultures, does not need TEE. Seen by ID, started on daptomycin and ceftriaxone. With negative cultures recommended 6 weeks of IV antibiotics. Adequate pain medications.   Discontinue IV opiates. Pain management with oral opiates, add Robaxin 1000 mg 3 times daily, gabapentin 200 mg 3 times daily today.  Will continue to increase dose of gabapentin.  Chronic microcytic anemia: Hemoglobin 7.  Supplemental iron.  No evidence of active bleeding.  B12 deficiency: Started supplementation.  Type 2 diabetes: Blood sugars started to elevate.  Increase dose of long-acting insulin to 20 units .  Stable today. Added metformin.  Constipation: Increase dose of MiraLAX to twice daily.  Senokot twice daily. Regulated now.  Disposition: Medically stable.  Needs 6 weeks of IV antibiotics.  Need to figure out logistics of receiving antibiotics in the prision before discharge.   DVT prophylaxis: SCDs Start:  11/11/23 0158   Code Status: Full code Family Communication: None at the bedside.  Security personnel at the bedside. Disposition Plan: Status is: Inpatient Remains inpatient appropriate because: IV antibiotics     Consultants:  Infectious disease Neurosurgery  Procedures:  IR guided aspiration  Antimicrobials:  Daptomycin and ceftriaxone 4/8     Objective: Vitals:   11/17/23 0844 11/17/23 2002 11/18/23 0441 11/18/23 0816  BP: (!) 132/95 139/87 133/83 134/82  Pulse: 99 (!) 105 100 89  Resp: 17 18 17 18   Temp: 98.2 F (36.8 C) 97.9 F (36.6 C) 98.1 F (36.7 C) 98.1 F (36.7 C)  TempSrc: Oral Oral    SpO2: 97% 99% 97% 97%  Weight:      Height:        Intake/Output Summary (Last 24 hours) at 11/18/2023 1140 Last data filed at 11/17/2023 1700 Gross per 24 hour  Intake 600 ml  Output --  Net 600 ml   Filed Weights   11/11/23 0319 11/12/23 0500 11/16/23 0618  Weight: 102.5 kg 96.5 kg 97.9 kg    Examination:  General exam: Appears calm and comfortable  Respiratory system: Clear to auscultation. Respiratory effort normal. Cardiovascular system: S1 & S2 heard, RRR. Aaron Aas Gastrointestinal system: Soft.  Nontender.  Bowel sound present. Central nervous system: Alert and oriented. No focal neurological deficits. Extremities: Symmetric 5 x 5 power.   Data Reviewed: I have personally reviewed following labs and imaging studies  CBC: Recent Labs  Lab 11/12/23 0612 11/14/23 0541 11/16/23 0426 11/18/23 0652  WBC 8.5 9.3 6.9 6.8  HGB 7.8* 7.7* 7.2* 7.0*  HCT 26.6* 25.7* 24.7* 24.4*  MCV 75.1* 73.4* 74.8* 74.8*  PLT 476* 458* 424* 429*  Basic Metabolic Panel: Recent Labs  Lab 11/12/23 0612 11/14/23 0541 11/16/23 0426 11/18/23 0652  NA 134* 135 132* 133*  K 3.9 4.0 3.5 4.1  CL 101 100 96* 96*  CO2 23 25 24 26   GLUCOSE 170* 201* 238* 171*  BUN 14 13 13 14   CREATININE 0.87 0.75 0.79 0.76  CALCIUM 8.8* 8.6* 8.6* 8.5*   GFR: Estimated Creatinine  Clearance: 111.6 mL/min (by C-G formula based on SCr of 0.76 mg/dL). Liver Function Tests: No results for input(s): "AST", "ALT", "ALKPHOS", "BILITOT", "PROT", "ALBUMIN" in the last 168 hours.  No results for input(s): "LIPASE", "AMYLASE" in the last 168 hours.  No results for input(s): "AMMONIA" in the last 168 hours. Coagulation Profile: No results for input(s): "INR", "PROTIME" in the last 168 hours.  Cardiac Enzymes: Recent Labs  Lab 11/13/23 0637  CKTOTAL 11*   BNP (last 3 results) No results for input(s): "PROBNP" in the last 8760 hours. HbA1C: No results for input(s): "HGBA1C" in the last 72 hours. CBG: Recent Labs  Lab 11/17/23 1658 11/17/23 2102 11/18/23 0037 11/18/23 0439 11/18/23 0642  GLUCAP 181* 189* 210* 175* 186*   Lipid Profile: No results for input(s): "CHOL", "HDL", "LDLCALC", "TRIG", "CHOLHDL", "LDLDIRECT" in the last 72 hours. Thyroid Function Tests: No results for input(s): "TSH", "T4TOTAL", "FREET4", "T3FREE", "THYROIDAB" in the last 72 hours. Anemia Panel: No results for input(s): "VITAMINB12", "FOLATE", "FERRITIN", "TIBC", "IRON", "RETICCTPCT" in the last 72 hours. Sepsis Labs: No results for input(s): "PROCALCITON", "LATICACIDVEN" in the last 168 hours.   Recent Results (from the past 240 hours)  Resp panel by RT-PCR (RSV, Flu A&B, Covid) Anterior Nasal Swab     Status: None   Collection Time: 11/10/23  4:15 PM   Specimen: Anterior Nasal Swab  Result Value Ref Range Status   SARS Coronavirus 2 by RT PCR NEGATIVE NEGATIVE Final   Influenza A by PCR NEGATIVE NEGATIVE Final   Influenza B by PCR NEGATIVE NEGATIVE Final    Comment: (NOTE) The Xpert Xpress SARS-CoV-2/FLU/RSV plus assay is intended as an aid in the diagnosis of influenza from Nasopharyngeal swab specimens and should not be used as a sole basis for treatment. Nasal washings and aspirates are unacceptable for Xpert Xpress SARS-CoV-2/FLU/RSV testing.  Fact Sheet for  Patients: BloggerCourse.com  Fact Sheet for Healthcare Providers: SeriousBroker.it  This test is not yet approved or cleared by the [place]  FDA and has been authorized for detection and/or diagnosis of SARS-CoV-2 by FDA under an Emergency Use Authorization (EUA). This EUA will remain in effect (meaning this test can be used) for the duration of the COVID-19 declaration under Section 564(b)(1) of the Act, 21 U.S.C. section 360bbb-3(b)(1), unless the authorization is terminated or revoked.     Resp Syncytial Virus by PCR NEGATIVE NEGATIVE Final    Comment: (NOTE) Fact Sheet for Patients: BloggerCourse.com  Fact Sheet for Healthcare Providers: SeriousBroker.it  This test is not yet approved or cleared by the [place]  FDA and has been authorized for detection and/or diagnosis of SARS-CoV-2 by FDA under an Emergency Use Authorization (EUA). This EUA will remain in effect (meaning this test can be used) for the duration of the COVID-19 declaration under Section 564(b)(1) of the Act, 21 U.S.C. section 360bbb-3(b)(1), unless the authorization is terminated or revoked.  Performed at [facility], [address], [place], [place] [address]   Blood culture (routine x 2)     Status: None   Collection Time: 11/10/23 11:03 PM  Specimen: BLOOD  Result Value Ref Range Status   Specimen Description BLOOD LEFT ANTECUBITAL  Final   Special Requests   Final    BOTTLES DRAWN AEROBIC AND ANAEROBIC Blood Culture adequate volume   Culture   Final    NO GROWTH 5 DAYS Performed at [facility], [address] [place], [place] [address]    Report Status 11/15/2023 FINAL  Final  Blood culture (routine x 2)     Status: None   Collection Time: 11/11/23 12:57 AM   Specimen: BLOOD RIGHT HAND  Result Value Ref Range Status   Specimen Description BLOOD RIGHT HAND  Final    Special Requests   Final    BOTTLES DRAWN AEROBIC AND ANAEROBIC Blood Culture results may not be optimal due to an inadequate volume of blood received in culture bottles   Culture   Final    NO GROWTH 5 DAYS Performed at [facility], [address], [place], [place] [address]    Report Status 11/16/2023 FINAL  Final  MRSA Next Gen by PCR, Nasal     Status: Abnormal   Collection Time: 11/11/23  3:40 AM   Specimen: Nasal Mucosa; Nasal Swab  Result Value Ref Range Status   MRSA by PCR Next Gen DETECTED (A) NOT DETECTED Final    Comment: RESULT CALLED TO, READ BACK BY AND VERIFIED WITH: RN Lonzell Robin on [phone] @0925  by SM (NOTE) The GeneXpert MRSA Assay (FDA approved for NASAL specimens only), is one component of a comprehensive MRSA colonization surveillance program. It is not intended to diagnose MRSA infection nor to guide or monitor treatment for MRSA infections. Test performance is not FDA approved in patients less than 39 years old. Performed at [facility], [address] [place], [place] [address]   Aerobic/Anaerobic Culture w Gram Stain (surgical/deep wound)     Status: None   Collection Time: 11/12/23  4:43 PM   Specimen: Abscess  Result Value Ref Range Status   Specimen Description ABSCESS  Final   Special Requests INTERVERTEBRAL DISC  Final   Gram Stain NO WBC SEEN NO ORGANISMS SEEN   Final   Culture   Final    No growth aerobically or anaerobically. Performed at [facility], [address], [place], [place] [address]    Report Status 11/17/2023 FINAL  Final  Fungus Culture With Stain     Status: None (Preliminary result)   Collection Time: 11/12/23  4:43 PM  Result Value Ref Range Status   Fungus Stain Final report  Final    Comment: (NOTE) Performed At: [facility] [address] [place], [place] [id number] Pearlean Botts MD [id number]    Fungus (Mycology) Culture PENDING  Incomplete   Fungal Source ABSCESS  Final    Comment:  INTERVERTEBRAL Performed at [facility], [address], [place], [place] [address]   Fungus Culture Result     Status: None   Collection Time: 11/12/23  4:43 PM  Result Value Ref Range Status   Result 1 Comment  Final    Comment: (NOTE) KOH/Calcofluor preparation:  no fungus observed. Performed At: [facility] [address] [place], [place] [id number] Pearlean Botts MD [id number]          Radiology Studies: No results found.       Scheduled Meds:  busPIRone  30 mg Oral BID   vitamin B-12  1,000 mcg Oral Daily   enoxaparin (LOVENOX) injection  50  mg Subcutaneous Q24H   ferrous sulfate  325 mg Oral TID WC   gabapentin  200 mg Oral TID   hydrOXYzine  100 mg Oral BID   insulin aspart  0-5 Units Subcutaneous QHS   insulin aspart  0-9 Units Subcutaneous TID WC   insulin glargine-yfgn  20 Units Subcutaneous QHS   metFORMIN  500 mg Oral BID WC   methocarbamol  1,000 mg Oral TID   polyethylene glycol  17 g Oral BID   prazosin  2 mg Oral QHS   senna-docusate  1 tablet Oral BID   sertraline  100 mg Oral QHS   topiramate  50 mg Oral BID   Continuous Infusions:  cefTRIAXone (ROCEPHIN)  IV 2 g (11/17/23 2105)   DAPTOmycin 700 mg (11/17/23 1344)     LOS: 7 days    Time spent: 35 minutes    Vada Garibaldi, MD [facility]

## 2023-11-19 ENCOUNTER — Other Ambulatory Visit: Payer: Self-pay

## 2023-11-19 DIAGNOSIS — M4626 Osteomyelitis of vertebra, lumbar region: Secondary | ICD-10-CM | POA: Diagnosis not present

## 2023-11-19 LAB — GLUCOSE, CAPILLARY
Glucose-Capillary: 165 mg/dL — ABNORMAL HIGH (ref 70–99)
Glucose-Capillary: 209 mg/dL — ABNORMAL HIGH (ref 70–99)
Glucose-Capillary: 134 mg/dL — ABNORMAL HIGH (ref 70–99)
Glucose-Capillary: 169 mg/dL — ABNORMAL HIGH (ref 70–99)

## 2023-11-19 MED ORDER — DAPTOMYCIN IV (FOR PTA / DISCHARGE USE ONLY)
700.0000 mg | INTRAVENOUS | 0 refills | Status: AC
Start: 2023-11-19 — End: ?

## 2023-11-19 MED ORDER — CEFTRIAXONE IV (FOR PTA / DISCHARGE USE ONLY): 2.0000 g | INTRAVENOUS | 0 refills | Status: AC

## 2023-11-19 MED ORDER — SENNOSIDES-DOCUSATE SODIUM 8.6-50 MG PO TABS: 1.0000 | ORAL_TABLET | Freq: Two times a day (BID) | ORAL | Status: AC

## 2023-11-19 MED ORDER — GABAPENTIN 100 MG PO CAPS: 200.0000 mg | ORAL_CAPSULE | Freq: Three times a day (TID) | ORAL | 0 refills | Status: DC

## 2023-11-19 MED ORDER — POLYETHYLENE GLYCOL 3350 17 G PO PACK: 17.0000 g | PACK | Freq: Two times a day (BID) | ORAL | 0 refills | Status: AC

## 2023-11-19 MED ORDER — OXYCODONE HCL 5 MG PO TABS: 5.0000 mg | ORAL_TABLET | ORAL | 0 refills | Status: AC | PRN

## 2023-11-19 MED ORDER — SODIUM CHLORIDE 0.9% FLUSH
10.0000 mL | INTRAVENOUS | Status: DC | PRN
  Administered 2023-11-19 – 2023-11-20 (×2): 10 mL

## 2023-11-19 MED ORDER — CYANOCOBALAMIN 1000 MCG PO TABS: 1000.0000 ug | ORAL_TABLET | Freq: Every day | ORAL | 0 refills | Status: AC

## 2023-11-19 MED ORDER — FERROUS SULFATE 325 (65 FE) MG PO TABS
325.0000 mg | ORAL_TABLET | Freq: Three times a day (TID) | ORAL | 0 refills | Status: AC
Start: 2023-11-19 — End: ?

## 2023-11-19 MED ORDER — CHLORHEXIDINE GLUCONATE CLOTH 2 % EX PADS
6.0000 | MEDICATED_PAD | Freq: Every day | CUTANEOUS | Status: DC
  Administered 2023-11-19 – 2023-11-21 (×3): 6 via TOPICAL

## 2023-11-19 MED ORDER — METHOCARBAMOL 1000 MG PO TABS: 1000.0000 mg | ORAL_TABLET | Freq: Three times a day (TID) | ORAL | 0 refills | Status: AC

## 2023-11-19 MED ORDER — SODIUM CHLORIDE 0.9% FLUSH
10.0000 mL | Freq: Two times a day (BID) | INTRAVENOUS | Status: DC
  Administered 2023-11-19 – 2023-11-21 (×4): 10 mL

## 2023-11-19 NOTE — Progress Notes (Signed)
 PICC order noted for imminent discharge.  Secure chat with Magdaline Schools, RN regarding when patient will discharge.  Made aware that PICC will be placed later today as PICC team is off campus.  Dr. Hilton Lucky included in secure chat.  Per Dr. Hilton Lucky patient will discharge in the morning.  Plan is to place PICC when PICC nurse is back on [facility] campus later today.

## 2023-11-19 NOTE — Progress Notes (Signed)
 PHARMACY CONSULT NOTE FOR:  OUTPATIENT  PARENTERAL ANTIBIOTIC THERAPY (OPAT)  Indication: L4-5 discitis/osteomyelitis  Regimen: Cubicin 700mg  IV Q24H and Rocephin 2gm IV Q24H End date: 12/24/23  IV antibiotic discharge orders are pended. To discharging provider:  please sign these orders via discharge navigator,  Select New Orders & click on the button choice - Manage This Unsigned Work.     Thank you for allowing pharmacy to be a part of this patient's care.  Drina Jobst D. Marikay Show, PharmD, BCPS, BCCCP 11/19/2023, 1:20 PM

## 2023-11-19 NOTE — Progress Notes (Signed)
 Peripherally Inserted Central Catheter Placement  The IV Nurse has discussed with the patient and/or persons authorized to consent for the patient, the purpose of this procedure and the potential benefits and risks involved with this procedure.  The benefits include less needle sticks, lab draws from the catheter, and the patient may be discharged home with the catheter. Risks include, but not limited to, infection, bleeding, blood clot (thrombus formation), and puncture of an artery; nerve damage and irregular heartbeat and possibility to perform a PICC exchange if needed/ordered by physician.  Alternatives to this procedure were also discussed.  Bard Power PICC patient education guide, fact sheet on infection prevention and patient information card has been provided to patient /or left at bedside.    PICC Placement Documentation  PICC Single Lumen 11/19/23 Right Basilic 36 cm 0 cm (Active)  Indication for Insertion or Continuance of Line Home intravenous therapies (PICC only) 11/19/23 1800  Exposed Catheter (cm) 0 cm 11/19/23 1800  Site Assessment Clean, Dry, Intact 11/19/23 1800  Line Status Flushed;Saline locked;Blood return noted 11/19/23 1800  Dressing Type Transparent;Securing device 11/19/23 1800  Dressing Status Antimicrobial disc/dressing in place;Clean, Dry, Intact 11/19/23 1800  Line Care Connections checked and tightened 11/19/23 1800  Line Adjustment (NICU/IV Team Only) No 11/19/23 1800  Dressing Intervention New dressing;Adhesive placed at insertion site (IV team only) 11/19/23 1800  Dressing Change Due 11/26/23 11/19/23 1800       Dru Georges 11/19/2023, 6:50 PM

## 2023-11-19 NOTE — TOC Progression Note (Addendum)
 Transition of Care [facility]) - Progression Note    Patient Details  Name: Krystal [name] MRN: [id number] Date of Birth: 1985-10-31  Transition of Care [facility]) CM/SW Contact  Elspeth Hals, LCSW Phone Number: 11/19/2023, 9:54 AM  Clinical Narrative:   CSW spoke with Nicole[facility] [facility] medical office, [phone]. She will follow up about possible transfer to medical detention unit and call back with update.   1600: CSW received call from [place], [facility].  Pt has been accepted by Dr Sharlyne Deans at [facility].  [phone].  MD informed.    CSW spoke with Officer [employer]rest Bio, latest they can transport today is 5pm.  Per Dr Hilton Lucky, cannot have pt ready by then with PICC, will need to be tomorrow.  Officer [employer]rest Bio and Chief Technology Officer both notified.  Per Peterson Brandt, they can pick pt up at 10am tomorrow.  Expected Discharge Plan: Corrections Facility Barriers to Discharge: Continued Medical Work up  Expected Discharge Plan and Services   Discharge Planning Services: CM Consult                                           Social Determinants of Health (SDOH) Interventions SDOH Screenings   Food Insecurity: No Food Insecurity (11/11/2023)  Housing: Low Risk  (11/11/2023)  Transportation Needs: No Transportation Needs (11/11/2023)  Utilities: Not At Risk (11/11/2023)  Alcohol Screen: Low Risk  (05/17/2020)  Depression (PHQ2-9): Medium Risk (10/19/2021)  Tobacco Use: High Risk (11/11/2023)    Readmission Risk Interventions     No data to display

## 2023-11-19 NOTE — Care Management (Signed)
 Called back received from Dr Sharlyne Deans at [facility] , [place] who is interested to take patient into their [place] [facility] medical facility  Discussed with [facility] jail system nurse and she is verifying their support system. Once approved , will ask if they need a PICC line.   Physician Line # [phone]

## 2023-11-19 NOTE — Progress Notes (Signed)
 Mobility Specialist Progress Note:   11/19/23 1000  Mobility  Activity Ambulated independently in hallway  Level of Assistance Standby assist, set-up cues, supervision of patient - no hands on  Assistive Device None  Distance Ambulated (ft) 150 ft  Activity Response Tolerated well  Mobility Referral Yes  Mobility visit 1 Mobility  Mobility Specialist Start Time (ACUTE ONLY) 1023  Mobility Specialist Stop Time (ACUTE ONLY) 1036  Mobility Specialist Time Calculation (min) (ACUTE ONLY) 13 min   Pt received in bed and agreeable. Successful void in BR before ambulating. Took x1 standing rest break d/t SOB and some back pain. Returned to room w/o fault. Pt left on EOB with call bell and all needs met. Officers present.  D'Vante Nolon Baxter Mobility Specialist Please contact via Special educational needs teacher or Rehab office at [phone]

## 2023-11-19 NOTE — Progress Notes (Signed)
 PROGRESS NOTE    Krystal [name]  ZOX:[id number] DOB: January 23, 1986 DOA: 11/10/2023 PCP: Lorayne Bender, MD    Brief Narrative:  38 year old with type 2 diabetes on insulin, opiate abuse presented from jail with worsening generalized weakness and intermittent low back pain for 6 months.  Hemodynamically stable and neurologically stable in the ER.  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  Admitted with ID consultation.  Subjective: Patient seen and examined.  No overnight events.  Still has some numbness but feels overall comfortable. Multiple communication and able to talk to physician Dr. Vassie Moment from correctional facilities.  They are requesting information so that she can be discharged with IV antibiotics. Possible discharge to medical facility of the correctional system in [place].   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  Neural foraminal narrowing. Bilateral psoas muscle edema consistent with myositis  Seen by neurosurgery, recommended no surgical intervention. IR aspirated fluid, blood cultures and aspirates are negative so far. TTE with no evidence of endocarditis.  With negative blood cultures, does not need TEE. Seen by ID, started on daptomycin and ceftriaxone. With negative cultures recommended 6 weeks of IV antibiotics. Adequate pain medications.   Pain management with oral opiates, add Robaxin 1000 mg 3 times daily, gabapentin 200 mg 3 times daily.  Chronic microcytic anemia: Hemoglobin 7.  Supplemental iron.  No evidence of active bleeding.  B12 deficiency: Started supplementation.  Type 2 diabetes: Blood sugars started to elevate.  Increase dose of long-acting insulin to 20 units .  Stable today. Added metformin.  Constipation: Increase dose of MiraLAX to twice daily.  Senokot twice daily. Regulated  now.  Disposition: Medically stable.  Needs 6 weeks of IV antibiotics.  Need to figure out logistics of receiving antibiotics in the prision before discharge. Possible discharge to medical facility in [place].    DVT prophylaxis: SCDs Start: 11/11/23 0158   Code Status: Full code Family Communication: None at the bedside.  Security personnel at the bedside. Disposition Plan: Status is: Inpatient Remains inpatient appropriate because: IV antibiotics     Consultants:  Infectious disease Neurosurgery  Procedures:  IR guided aspiration  Antimicrobials:  Daptomycin and ceftriaxone 4/8     Objective: Vitals:   11/18/23 1524 11/18/23 1958 11/19/23 0530 11/19/23 0642  BP: 122/69 (!) 147/94 125/83   Pulse: 94 99 95   Resp: 16 17 17    Temp: 97.6 F (36.4 C) 98.1 F (36.7 C) 98.7 F (37.1 C)   TempSrc:  Oral Oral   SpO2: 98% 98% 96%   Weight:    101.2 kg  Height:        Intake/Output Summary (Last 24 hours) at 11/19/2023 1336 Last data filed at 11/19/2023 0900 Gross per 24 hour  Intake 834 ml  Output --  Net 834 ml   Filed Weights   11/12/23 0500 11/16/23 0618 11/19/23 0642  Weight: 96.5 kg 97.9 kg 101.2 kg    Examination:  General exam: Appears calm and comfortable  Respiratory system: Clear to auscultation. Respiratory effort normal. Cardiovascular system: S1 & S2 heard, RRR. Marland Kitchen Gastrointestinal system: Soft.  Nontender.  Bowel sound present. Central nervous system: Alert and oriented. No focal neurological deficits. Extremities: Symmetric 5 x 5 power.   Data Reviewed: I have personally reviewed following labs and imaging studies  CBC: Recent Labs  Lab 11/14/23 0541 11/16/23 0426 11/18/23 8119  WBC 9.3 6.9 6.8  HGB 7.7* 7.2* 7.0*  HCT 25.7* 24.7* 24.4*  MCV 73.4* 74.8* 74.8*  PLT 458* 424* 429*   Basic Metabolic Panel: Recent Labs  Lab 11/14/23 0541 11/16/23 0426 11/18/23 0652  NA 135 132* 133*  K 4.0 3.5 4.1  CL 100 96* 96*  CO2 25 24 26    GLUCOSE 201* 238* 171*  BUN 13 13 14   CREATININE 0.75 0.79 0.76  CALCIUM 8.6* 8.6* 8.5*   GFR: Estimated Creatinine Clearance: 113.6 mL/min (by C-G formula based on SCr of 0.76 mg/dL). Liver Function Tests: No results for input(s): "AST", "ALT", "ALKPHOS", "BILITOT", "PROT", "ALBUMIN" in the last 168 hours.  No results for input(s): "LIPASE", "AMYLASE" in the last 168 hours.  No results for input(s): "AMMONIA" in the last 168 hours. Coagulation Profile: No results for input(s): "INR", "PROTIME" in the last 168 hours.  Cardiac Enzymes: Recent Labs  Lab 11/13/23 0637  CKTOTAL 11*   BNP (last 3 results) No results for input(s): "PROBNP" in the last 8760 hours. HbA1C: No results for input(s): "HGBA1C" in the last 72 hours. CBG: Recent Labs  Lab 11/18/23 1202 11/18/23 1724 11/18/23 2121 11/19/23 0646 11/19/23 1146  GLUCAP 203* 213* 134* 165* 209*   Lipid Profile: No results for input(s): "CHOL", "HDL", "LDLCALC", "TRIG", "CHOLHDL", "LDLDIRECT" in the last 72 hours. Thyroid Function Tests: No results for input(s): "TSH", "T4TOTAL", "FREET4", "T3FREE", "THYROIDAB" in the last 72 hours. Anemia Panel: No results for input(s): "VITAMINB12", "FOLATE", "FERRITIN", "TIBC", "IRON", "RETICCTPCT" in the last 72 hours. Sepsis Labs: No results for input(s): "PROCALCITON", "LATICACIDVEN" in the last 168 hours.   Recent Results (from the past 240 hours)  Resp panel by RT-PCR (RSV, Flu A&B, Covid) Anterior Nasal Swab     Status: None   Collection Time: 11/10/23  4:15 PM   Specimen: Anterior Nasal Swab  Result Value Ref Range Status   SARS Coronavirus 2 by RT PCR NEGATIVE NEGATIVE Final   Influenza A by PCR NEGATIVE NEGATIVE Final   Influenza B by PCR NEGATIVE NEGATIVE Final    Comment: (NOTE) The Xpert Xpress SARS-CoV-2/FLU/RSV plus assay is intended as an aid in the diagnosis of influenza from Nasopharyngeal swab specimens and should not be used as a sole basis for treatment.  Nasal washings and aspirates are unacceptable for Xpert Xpress SARS-CoV-2/FLU/RSV testing.  Fact Sheet for Patients: BloggerCourse.com  Fact Sheet for Healthcare Providers: SeriousBroker.it  This test is not yet approved or cleared by the [place] FDA and has been authorized for detection and/or diagnosis of SARS-CoV-2 by FDA under an Emergency Use Authorization (EUA). This EUA will remain in effect (meaning this test can be used) for the duration of the COVID-19 declaration under Section 564(b)(1) of the Act, 21 U.S.C. section 360bbb-3(b)(1), unless the authorization is terminated or revoked.     Resp Syncytial Virus by PCR NEGATIVE NEGATIVE Final    Comment: (NOTE) Fact Sheet for Patients: BloggerCourse.com  Fact Sheet for Healthcare Providers: SeriousBroker.it  This test is not yet approved or cleared by the [place] FDA and has been authorized for detection and/or diagnosis of SARS-CoV-2 by FDA under an Emergency Use Authorization (EUA). This EUA will remain in effect (meaning this test can be used) for the duration of the COVID-19 declaration under Section 564(b)(1) of the Act, 21 U.S.C. section 360bbb-3(b)(1), unless the authorization is terminated or revoked.  Performed at [facility], [address] [place], [place] [address]   Blood culture (routine x  2)     Status: None   Collection Time: 11/10/23 11:03 PM   Specimen: BLOOD  Result Value Ref Range Status   Specimen Description BLOOD LEFT ANTECUBITAL  Final   Special Requests   Final    BOTTLES DRAWN AEROBIC AND ANAEROBIC Blood Culture adequate volume   Culture   Final    NO GROWTH 5 DAYS Performed at [facility], [address], [place], [place] [address]    Report Status 11/15/2023 FINAL  Final  Blood culture (routine x 2)     Status: None   Collection Time: 11/11/23 12:57 AM    Specimen: BLOOD RIGHT HAND  Result Value Ref Range Status   Specimen Description BLOOD RIGHT HAND  Final   Special Requests   Final    BOTTLES DRAWN AEROBIC AND ANAEROBIC Blood Culture results may not be optimal due to an inadequate volume of blood received in culture bottles   Culture   Final    NO GROWTH 5 DAYS Performed at [facility], [address] [place], [place] [address]    Report Status 11/16/2023 FINAL  Final  MRSA Next Gen by PCR, Nasal     Status: Abnormal   Collection Time: 11/11/23  3:40 AM   Specimen: Nasal Mucosa; Nasal Swab  Result Value Ref Range Status   MRSA by PCR Next Gen DETECTED (A) NOT DETECTED Final    Comment: RESULT CALLED TO, READ BACK BY AND VERIFIED WITH: RN Lydia on [phone] @0925  by SM (NOTE) The GeneXpert MRSA Assay (FDA approved for NASAL specimens only), is one component of a comprehensive MRSA colonization surveillance program. It is not intended to diagnose MRSA infection nor to guide or monitor treatment for MRSA infections. Test performance is not FDA approved in patients less than 32 years old. Performed at [facility], [address] [place], [place] [address]   Aerobic/Anaerobic Culture w Gram Stain (surgical/deep wound)     Status: None   Collection Time: 11/12/23  4:43 PM   Specimen: Abscess  Result Value Ref Range Status   Specimen Description ABSCESS  Final   Special Requests INTERVERTEBRAL DISC  Final   Gram Stain NO WBC SEEN NO ORGANISMS SEEN   Final   Culture   Final    No growth aerobically or anaerobically. Performed at [facility], [address] [place], [place] [address]    Report Status 11/17/2023 FINAL  Final  Fungus Culture With Stain     Status: None (Preliminary result)   Collection Time: 11/12/23  4:43 PM  Result Value Ref Range Status   Fungus Stain Final report  Final    Comment: (NOTE) Performed At: [facility] [address] [place], [place] [id number] Jolene Schimke MD  TK:[id number]    Fungus (Mycology) Culture PENDING  Incomplete   Fungal Source ABSCESS  Final    Comment: INTERVERTEBRAL Performed at [facility], [address], [place], [place] [address]   Fungus Culture Result     Status: None   Collection Time: 11/12/23  4:43 PM  Result Value Ref Range Status   Result 1 Comment  Final    Comment: (NOTE) KOH/Calcofluor preparation:  no fungus observed. Performed At: [facility] [address] [place], [place] [id number] Jolene Schimke MD BJ:[id number]          Radiology Studies: No results found.       Scheduled Meds:  busPIRone  30 mg Oral BID  vitamin B-12  1,000 mcg Oral Daily   enoxaparin (LOVENOX) injection  50 mg Subcutaneous Q24H   ferrous sulfate  325 mg Oral TID WC   gabapentin  200 mg Oral TID   hydrOXYzine  100 mg Oral BID   insulin aspart  0-5 Units Subcutaneous QHS   insulin aspart  0-9 Units Subcutaneous TID WC   insulin glargine-yfgn  20 Units Subcutaneous QHS   metFORMIN  500 mg Oral BID WC   methocarbamol  1,000 mg Oral TID   polyethylene glycol  17 g Oral BID   prazosin  2 mg Oral QHS   senna-docusate  1 tablet Oral BID   sertraline  100 mg Oral QHS   topiramate  50 mg Oral BID   Continuous Infusions:  cefTRIAXone (ROCEPHIN)  IV 2 g (11/18/23 2017)   DAPTOmycin 700 mg (11/18/23 1547)     LOS: 8 days    Time spent: 35 minutes    Krystal Garibaldi, MD [facility]

## 2023-11-19 NOTE — Discharge Summary (Signed)
 Physician Discharge Summary  Krystal [name] ZOX:[id number] DOB: 24-Sep-1985 DOA: 11/10/2023  PCP: Lorayne Bender, MD  Admit date: 11/10/2023 Discharge date: 11/19/2023  Admitted From: Prison    Disposition:  Prison   Recommendations for Outpatient Follow-up:  Follow up with PCP in 1-2 weeks Check CBC, BMP, CPK, CRP and ESR every week. Remove PICC line at the end of therapy.  Discharge Condition: Stable CODE STATUS: Full code Diet recommendation: Low-carb diet  Discharge summary: 38 year old with type 2 diabetes on insulin, history of opiate abuse presented from jail with worsening generalized weakness and intermittent low back pain for 6 months.  Hemodynamically stable and neurologically stable in the ER.  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  Admitted with neurosurgery and ID consultation.   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  Neural foraminal narrowing. Bilateral psoas muscle edema consistent with myositis   Seen by neurosurgery, recommended no surgical intervention. IR aspirated fluid, blood cultures and aspirates are negative so far. TTE with no evidence of endocarditis.  With negative blood cultures, does not need TEE. Seen by ID, started on daptomycin and ceftriaxone. With negative cultures recommended 6 weeks of IV antibiotics. Pain management with oral opiates, add Robaxin 1000 mg 3 times daily, gabapentin 200 mg 3 times daily. Antibiotic recommendations daptomycin and ceftriaxone until 12/24/2023 through PICC line. Drug monitoring to be done by correctional facility physician, CBC, BMP, CK, ESR, CRP every week.   Chronic microcytic anemia: Hemoglobin 7.  Supplemental iron.  No evidence of active bleeding.  Will prescribe iron on discharge.   B12 deficiency: Started supplementation.  Will prescribe on  discharge.   Type 2 diabetes: Noncompliant with treatment.  Resume long-acting insulin and metformin.   Constipation: Scheduled dose of MiraLAX and Senokot.  Regulated now.   Disposition: Medically stable.  Needs 6 weeks of IV antibiotics until 12/24/2023. Multiple communication with [facility] facility, [facility] correctional facility.  They were able to manage IV antibiotics.  Patient will receive PICC line and be discharged with instructions.   Discharge Diagnoses:  Principal Problem:   Osteomyelitis of lumbar spine (HCC) Active Problems:   Depression   Diarrhea   DM2 (diabetes mellitus, type 2) (HCC)   Moderate episode of recurrent major depressive disorder (HCC)   Type 2 diabetes mellitus in pregnancy   Abscess in epidural space of lumbar spine   Low back pain   Hydroureteronephrosis   AKI (acute kidney injury) (HCC)   Generalized weakness   Microcytic anemia   Epidural abscess   In police custody   Vertebral osteomyelitis [facility])    Discharge Instructions  Discharge Instructions     [facility] pharmacist to adjust dose for Vancomycin, Aminoglycosides and other anti-infective therapies as requested by physician.   Complete by: As directed    [facility] to provide Cath Flo 2mg    Complete by: As directed    Administer for PICC line occlusion and as ordered by physician for other access device issues.   Anaphylaxis Kit: Provided to treat any anaphylactic reaction to the medication being provided to the patient if First Dose or when requested by physician   Complete by: As directed    Epinephrine 1mg /ml vial / amp: Administer 0.3mg  (0.66ml) subcutaneously once for moderate to severe anaphylaxis, nurse to call physician and pharmacy when reaction occurs and call 911 if needed  for immediate care   Diphenhydramine 50mg /ml IV vial: Administer 25-50mg  IV/IM PRN for first dose reaction, rash, itching, mild reaction, nurse to call physician  and pharmacy when reaction occurs   Sodium Chloride 0.9% NS IV: Administer if needed for hypovolemic blood pressure drop or as ordered by physician after call to physician with anaphylactic reaction   Change dressing on IV access line weekly and PRN   Complete by: As directed    Diet - low sodium heart healthy   Complete by: As directed    Discharge instructions   Complete by: As directed    Check CBC, BMP, CRP, ESR and CPK every week   Flush IV access with Sodium Chloride 0.9% and Heparin 10 units/ml or 100 units/ml   Complete by: As directed    Home infusion instructions - [facility]   Complete by: As directed    Instructions: Flush IV access with Sodium Chloride 0.9% and Heparin 10units/ml or 100units/ml   Change dressing on IV access line: Weekly and PRN   Instructions Cath Flo 2mg : Administer for PICC Line occlusion and as ordered by physician for other access device   [facility] pharmacist to adjust dose for: Vancomycin, Aminoglycosides and other anti-infective therapies as requested by physician   Increase activity slowly   Complete by: As directed    Method of administration may be changed at the discretion of home infusion pharmacist based upon assessment of the patient and/or caregiver's ability to self-administer the medication ordered   Complete by: As directed    No wound care   Complete by: As directed       Allergies as of 11/19/2023   No Known Allergies      Medication List     TAKE these medications    acetaminophen 325 MG tablet Commonly known as: TYLENOL Take 650 mg by mouth 2 (two) times daily.   busPIRone 30 MG tablet Commonly known as: BUSPAR Take 30 mg by mouth 2 (two) times daily.   cefTRIAXone IVPB Commonly known as: ROCEPHIN Inject 2 g into the vein daily. Indication:  L4-5 discitis/osteomyelitis First Dose: No Last Day of Therapy:  12/24/23 Labs - Once weekly:  CBC/D and BMP, Labs - Once weekly: ESR and  CRP Method of administration: IV Push Method of administration may be changed at the discretion of home infusion pharmacist based upon assessment of the patient and/or caregiver's ability to self-administer the medication ordered.   cyanocobalamin 1000 MCG tablet Take 1 tablet (1,000 mcg total) by mouth daily. Start taking on: November 20, 2023   daptomycin IVPB Commonly known as: CUBICIN Inject 700 mg into the vein daily. Indication:  L4-5 discitis/osteomyelitis  First Dose: No Last Day of Therapy:  12/24/23 Labs - Once weekly:  CBC/D, BMP, and CPK Labs - Once weekly: ESR and CRP Method of administration: IV Push Method of administration may be changed at the discretion of home infusion pharmacist based upon assessment of the patient and/or caregiver's ability to self-administer the medication ordered.   ferrous sulfate 325 (65 FE) MG tablet Take 1 tablet (325 mg total) by mouth 3 (three) times daily with meals.   gabapentin 100 MG capsule Commonly known as: NEURONTIN Take 2 capsules (200 mg total) by mouth 3 (three) times daily.   hydrOXYzine 50 MG tablet Commonly known as: ATARAX Take 100 mg by mouth 2 (two) times daily.   insulin glargine 100 UNIT/ML Solostar Pen Commonly known as: LANTUS Inject 17 Units into  the skin at bedtime.   insulin regular 100 units/mL injection Commonly known as: NOVOLIN R Inject 2-14 Units into the skin 3 (three) times daily before meals. Sliding scale: 0-150 = 0 units 151-200= 2 units 201-250= 4 units 251-300= 6 units 301-350= 8 units 351-400= 10 units [phone]= 12 units, and call provider   metFORMIN 750 MG 24 hr tablet Commonly known as: GLUCOPHAGE-XR Take 750 mg by mouth daily with breakfast.   Methocarbamol 1000 MG Tabs Take 1,000 mg by mouth 3 (three) times daily for 7 days.   oxyCODONE 5 MG immediate release tablet Commonly known as: Oxy IR/ROXICODONE Take 1 tablet (5 mg total) by mouth every 4 (four) hours as needed for up to 5  days for severe pain (pain score 7-10) or breakthrough pain.   polyethylene glycol 17 g packet Commonly known as: MIRALAX / GLYCOLAX Take 17 g by mouth 2 (two) times daily.   prazosin 2 MG capsule Commonly known as: MINIPRESS Take 2 mg by mouth at bedtime.   senna-docusate 8.6-50 MG tablet Commonly known as: Senokot-S Take 1 tablet by mouth 2 (two) times daily.   sertraline 100 MG tablet Commonly known as: ZOLOFT Take 100 mg by mouth at bedtime.   topiramate 50 MG tablet Commonly known as: TOPAMAX Take 50 mg by mouth 2 (two) times daily.               Discharge Care Instructions  (From admission, onward)           Start     Ordered   11/19/23 0000  Change dressing on IV access line weekly and PRN  (Home infusion instructions - [facility] )        11/19/23 1603            Follow-up Information     Naida Austria, MD Follow up.   Specialty: Family Medicine Contact information: [address] [place] [address] [phone]                No Known Allergies  Consultations: Neurosurgery Infectious disease   Procedures/Studies: US  EKG SITE RITE Result Date: 11/19/2023 If Site Rite image not attached, placement could not be confirmed due to current cardiac rhythm.  ECHOCARDIOGRAM COMPLETE Result Date: 11/13/2023    ECHOCARDIOGRAM REPORT   Patient Name:   ANUSHA CLAUS Maynes Date of Exam: 11/13/2023 Medical Rec #:  [id number]          Height:       64.0 in Accession #:    [id number]         Weight:       212.7 lb Date of Birth:  11-22-1985          BSA:          2.008 m Patient Age:    37 years           BP:           127/85 mmHg Patient Gender: F                  HR:           103 bpm. Exam Location:  Inpatient Procedure: 2D Echo, Color Doppler and Cardiac Doppler (Both Spectral and Color            Flow Doppler were utilized during procedure). Indications:    Bacteremia  History:        Patient has no prior history of Echocardiogram  examinations.  Risk Factors:Diabetes.  Sonographer:    Andrena Bang Referring Phys: [id number] Haydee Lipa IMPRESSIONS  1. Left ventricular ejection fraction, by estimation, is 60 to 65%. The left ventricle has normal function. The left ventricle has no regional wall motion abnormalities. Left ventricular diastolic parameters were normal.  2. Right ventricular systolic function is normal. The right ventricular size is normal.  3. The mitral valve is normal in structure. No evidence of mitral valve regurgitation. No evidence of mitral stenosis.  4. The aortic valve was not well visualized. Aortic valve regurgitation is not visualized. No aortic stenosis is present.  5. The inferior vena cava is normal in size with greater than 50% respiratory variability, suggesting right atrial pressure of 3 mmHg. Comparison(s): No prior Echocardiogram. FINDINGS  Left Ventricle: Left ventricular ejection fraction, by estimation, is 60 to 65%. The left ventricle has normal function. The left ventricle has no regional wall motion abnormalities. Strain was performed and the global longitudinal strain is indeterminate. The left ventricular internal cavity size was normal in size. Suboptimal image quality limits for assessment of left ventricular hypertrophy. Left ventricular diastolic parameters were normal. Right Ventricle: The right ventricular size is normal. No increase in right ventricular wall thickness. Right ventricular systolic function is normal. Left Atrium: Left atrial size was normal in size. Right Atrium: Right atrial size was normal in size. Pericardium: There is no evidence of pericardial effusion. Mitral Valve: The mitral valve is normal in structure. No evidence of mitral valve regurgitation. No evidence of mitral valve stenosis. Tricuspid Valve: The tricuspid valve is normal in structure. Tricuspid valve regurgitation is not demonstrated. No evidence of tricuspid stenosis. Aortic Valve: The aortic  valve was not well visualized. Aortic valve regurgitation is not visualized. No aortic stenosis is present. Aortic valve mean gradient measures 6.0 mmHg. Aortic valve peak gradient measures 15.5 mmHg. Pulmonic Valve: The pulmonic valve was normal in structure. Pulmonic valve regurgitation is not visualized. No evidence of pulmonic stenosis. Aorta: The aortic root, ascending aorta and aortic arch are all structurally normal, with no evidence of dilitation or obstruction. Venous: The inferior vena cava is normal in size with greater than 50% respiratory variability, suggesting right atrial pressure of 3 mmHg. IAS/Shunts: The atrial septum is grossly normal. Additional Comments: 3D was performed not requiring image post processing on an independent workstation and was indeterminate.  LEFT VENTRICLE PLAX 2D LVIDd:         4.30 cm     Diastology LVIDs:         2.90 cm     LV e' medial:  13.50 cm/s LV PW:         1.50 cm     LV e' lateral: 14.00 cm/s LV IVS:        0.80 cm LVOT diam:     2.10 cm LVOT Area:     3.46 cm  LV Volumes (MOD) LV vol d, MOD A2C: 78.7 ml LV vol d, MOD A4C: 88.2 ml LV vol s, MOD A2C: 30.1 ml LV vol s, MOD A4C: 34.7 ml LV SV MOD A2C:     48.6 ml LV SV MOD A4C:     88.2 ml LV SV MOD BP:      51.4 ml RIGHT VENTRICLE RV S prime:     11.70 cm/s TAPSE (M-mode): 1.6 cm LEFT ATRIUM             Index LA diam:        3.90 cm 1.94 cm/m  LA Vol (A2C):   28.5 ml 14.19 ml/m LA Vol (A4C):   36.0 ml 17.92 ml/m LA Biplane Vol: 33.1 ml 16.48 ml/m  AORTIC VALVE AV Vmax:      197.00 cm/s AV Vmean:     115.000 cm/s AV VTI:       0.259 m AV Peak Grad: 15.5 mmHg AV Mean Grad: 6.0 mmHg  AORTA Ao Asc diam: 2.60 cm  SHUNTS Systemic Diam: 2.10 cm Riley Lam MD Electronically signed by Riley Lam MD Signature Date/Time: 11/13/2023/5:46:25 PM    Final    CT ASPIRATION N/S Result Date: 11/12/2023 INDICATION: Clinical concern for discitis/osteomyelitis. EXAM: CT-guided disc aspiration TECHNIQUE:  Multidetector CT imaging of the lumbar spine was performed following the standard protocol without IV contrast. RADIATION DOSE REDUCTION: This exam was performed according to the departmental dose-optimization program which includes automated exposure control, adjustment of the mA and/or kV according to patient size and/or use of iterative reconstruction technique. MEDICATIONS: No preoperative antibiotics. ANESTHESIA/SEDATION: Moderate (conscious) sedation was employed during this procedure. A total of Versed 3 mg, Dilaudid 1 mg, and Fentanyl 150 mcg was administered intravenously by the radiology nurse. Total intra-service moderate Sedation Time: 31 minutes. The patient's level of consciousness and vital signs were monitored continuously by radiology nursing throughout the procedure under my direct supervision. COMPLICATIONS: None immediate. PROCEDURE: Informed written consent was obtained from the patient after a thorough discussion of the procedural risks, benefits and alternatives. All questions were addressed. Maximal Sterile Barrier Technique was utilized including caps, mask, sterile gowns, sterile gloves, sterile drape, hand hygiene and skin antiseptic. A timeout was performed prior to the initiation of the procedure. Using intermittent CT guidance, a 17 gauge introducer needle was advanced into the disc space at L4-L5. Approximately 1 cc of bloody fluid was removed and submitted for laboratory analysis. The needle was removed and a dressing was applied. IMPRESSION: 1. CT-guided disc aspiration of L4-L5 for clinical concern for discitis/osteomyelitis. Electronically Signed   By: Lowell Guitar M.D.   On: 11/12/2023 17:34   MR THORACIC SPINE W WO CONTRAST Result Date: 11/11/2023 CLINICAL DATA:  Mid back pain with infection suspected EXAM: MRI THORACIC AND LUMBAR SPINE WITHOUT AND WITH CONTRAST TECHNIQUE: Multiplanar and multiecho pulse sequences of the thoracic and lumbar spine were obtained without and with  intravenous contrast. CONTRAST:  10mL GADAVIST GADOBUTROL 1 MMOL/ML IV SOLN COMPARISON:  None Available. FINDINGS: MRI THORACIC SPINE FINDINGS Alignment:  Physiologic. Vertebrae: No fracture, evidence of discitis, or bone lesion. Cord:  Normal signal and morphology. Paraspinal and other soft tissues: Negative. Disc levels: T6-7: Small central disc protrusion effaces the ventral thecal sac and contacts the spinal cord. T8-9: Small central disc protrusion contacts the ventral spinal cord. The other thoracic disc levels are unremarkable. No abnormal contrast enhancement. MRI LUMBAR SPINE FINDINGS Segmentation:  Standard. Alignment:  Physiologic. Vertebrae: Diffuse bone marrow edema at L4 and L5 with associated contrast enhancement and abnormal signal within the disc space. There is ventral epidural contrast enhancement at both levels that narrows the thecal sac. Conus medullaris: Extends to the L1 level and appears normal. Paraspinal and other soft tissues: Left hydroureteronephrosis. Bilateral psoas muscle edema and abnormal contrast enhancement without focal fluid collection. Disc levels: L1-L2: Normal disc space and facet joints. No spinal canal stenosis. No neural foraminal stenosis. L2-L3: Normal disc space and facet joints. No spinal canal stenosis. No neural foraminal stenosis. L3-L4: Small disc bulge and mild facet hypertrophy. No spinal canal stenosis. No neural foraminal stenosis.  L4-L5: Disc abnormalities as above. Severe spinal canal stenosis. Moderate right and severe left neural foraminal stenosis. L5-S1: Normal disc space and facet joints. No spinal canal stenosis. No neural foraminal stenosis. Visualized sacrum: Normal. IMPRESSION: 1. L4-L5 discitis-osteomyelitis with ventral epidural phlegmon/abscess resulting in severe spinal canal stenosis and moderate right and severe left neural foraminal stenosis. 2. Bilateral psoas muscle edema and abnormal contrast enhancement without focal fluid collection,  consistent with myositis. 3. Left hydroureteronephrosis. 4. Small central disc protrusions at T6-7 and T8-9 contact the ventral spinal cord. Electronically Signed   By: Juanetta Nordmann M.D.   On: 11/11/2023 00:38   MR Lumbar Spine W Wo Contrast Result Date: 11/11/2023 CLINICAL DATA:  Mid back pain with infection suspected EXAM: MRI THORACIC AND LUMBAR SPINE WITHOUT AND WITH CONTRAST TECHNIQUE: Multiplanar and multiecho pulse sequences of the thoracic and lumbar spine were obtained without and with intravenous contrast. CONTRAST:  10mL GADAVIST GADOBUTROL 1 MMOL/ML IV SOLN COMPARISON:  None Available. FINDINGS: MRI THORACIC SPINE FINDINGS Alignment:  Physiologic. Vertebrae: No fracture, evidence of discitis, or bone lesion. Cord:  Normal signal and morphology. Paraspinal and other soft tissues: Negative. Disc levels: T6-7: Small central disc protrusion effaces the ventral thecal sac and contacts the spinal cord. T8-9: Small central disc protrusion contacts the ventral spinal cord. The other thoracic disc levels are unremarkable. No abnormal contrast enhancement. MRI LUMBAR SPINE FINDINGS Segmentation:  Standard. Alignment:  Physiologic. Vertebrae: Diffuse bone marrow edema at L4 and L5 with associated contrast enhancement and abnormal signal within the disc space. There is ventral epidural contrast enhancement at both levels that narrows the thecal sac. Conus medullaris: Extends to the L1 level and appears normal. Paraspinal and other soft tissues: Left hydroureteronephrosis. Bilateral psoas muscle edema and abnormal contrast enhancement without focal fluid collection. Disc levels: L1-L2: Normal disc space and facet joints. No spinal canal stenosis. No neural foraminal stenosis. L2-L3: Normal disc space and facet joints. No spinal canal stenosis. No neural foraminal stenosis. L3-L4: Small disc bulge and mild facet hypertrophy. No spinal canal stenosis. No neural foraminal stenosis. L4-L5: Disc abnormalities as above.  Severe spinal canal stenosis. Moderate right and severe left neural foraminal stenosis. L5-S1: Normal disc space and facet joints. No spinal canal stenosis. No neural foraminal stenosis. Visualized sacrum: Normal. IMPRESSION: 1. L4-L5 discitis-osteomyelitis with ventral epidural phlegmon/abscess resulting in severe spinal canal stenosis and moderate right and severe left neural foraminal stenosis. 2. Bilateral psoas muscle edema and abnormal contrast enhancement without focal fluid collection, consistent with myositis. 3. Left hydroureteronephrosis. 4. Small central disc protrusions at T6-7 and T8-9 contact the ventral spinal cord. Electronically Signed   By: Juanetta Nordmann M.D.   On: 11/11/2023 00:38   CT Angio Chest PE W and/or Wo Contrast Result Date: 11/10/2023 CLINICAL DATA:  Right-sided flank pain shortness of breath positive D-dimer EXAM: CT ANGIOGRAPHY CHEST CT ABDOMEN AND PELVIS WITH CONTRAST TECHNIQUE: Multidetector CT imaging of the chest was performed using the standard protocol during bolus administration of intravenous contrast. Multiplanar CT image reconstructions and MIPs were obtained to evaluate the vascular anatomy. Multidetector CT imaging of the abdomen and pelvis was performed using the standard protocol during bolus administration of intravenous contrast. RADIATION DOSE REDUCTION: This exam was performed according to the departmental dose-optimization program which includes automated exposure control, adjustment of the mA and/or kV according to patient size and/or use of iterative reconstruction technique. CONTRAST:  75mL OMNIPAQUE IOHEXOL 350 MG/ML SOLN COMPARISON:  CT 10/20/2021 FINDINGS: CTA CHEST FINDINGS Cardiovascular: Slightly  suboptimal opacification of the pulmonary arteries to the segmental level. No evidence of pulmonary embolism. Normal heart size. No pericardial effusion. Nonaneurysmal aorta. Mediastinum/Nodes: Patent trachea. Slightly enlarged thyroid. Scattered thyroid nodules,  largest seen in the inferior left lobe measuring 14 mm, no specific imaging follow-up is recommended. No suspicious lymph nodes. Esophagus within normal limits Lungs/Pleura: Minimal atelectasis at the right middle lobe. No acute airspace disease, pleural effusion or pneumothorax Musculoskeletal: No acute osseous abnormality Review of the MIP images confirms the above findings. CT ABDOMEN and PELVIS FINDINGS Hepatobiliary: Liver is enlarged with craniocaudal measurement of 23 cm. Diffuse decreased hepatic density consistent with steatosis. No calcified gallstone or biliary dilatation Pancreas: Unremarkable. No pancreatic ductal dilatation or surrounding inflammatory changes. Spleen: Normal in size without focal abnormality. Adrenals/Urinary Tract: Adrenal glands are normal. Diffuse hypoenhancement of the left kidney. Mild to moderate left hydronephrosis and proximal hydroureter. No obstructing stone. The bladder is unremarkable Stomach/Bowel: The stomach is nonenlarged. No dilated small bowel. No acute bowel wall thickening. Negative appendix Vascular/Lymphatic: Nonaneurysmal aorta.  No suspicious lymph nodes. Reproductive: Uterus unremarkable. Complex left adnexal cyst measuring 4.4 by 3.7 cm. Other: Negative for free air. Musculoskeletal: Heterogenous left greater than right psoas muscle enlargement which appears slightly hyperdense. On the left, this extends to the left iliacus muscle where there is also stranding. No active extravasation. Abnormal prevertebral soft tissue thickening extending from about L3-L4 to the L5-S1 level. There is endplate destructive change at L4-L5 and heterogeneous sclerosis at the L4, L5 and S1 vertebral bodies which is new compared to the prior CT. Review of the MIP images confirms the above findings. IMPRESSION: 1. Negative for acute pulmonary embolus. 2. Diffuse hypoenhancement of the left kidney with mild to moderate left hydronephrosis and proximal hydroureter. No obstructing  stone. Source of obstruction appears to be heterogenous soft tissue thickening in the left paraspinal region subsequently discussed. 3. Heterogenous left greater than right psoas muscle enlargement which appears slightly hyperdense. On the left, this extends to the left iliacus muscle where there is also stranding. The overall appearance is suspect for retroperitoneal hematoma. There is no active extravasation on this exam. 4. Abnormal prevertebral soft tissue thickening extending from about L3-L4 to the L5-S1 level with endplate destructive change at L4-L5 and heterogeneous sclerosis at the L4, L5 and S1 vertebral bodies. Findings are highly suspicious for discitis/osteomyelitis. MRI of the lumbar spine with and without contrast is recommended for further evaluation. 5. Hepatomegaly with hepatic steatosis. 6. Complex left adnexal cyst measuring up to 4.4 cm. Correlation with pelvic ultrasound is suggested. Electronically Signed   By: Esmeralda Hedge M.D.   On: 11/10/2023 22:56   CT ABDOMEN PELVIS W CONTRAST Result Date: 11/10/2023 CLINICAL DATA:  Right-sided flank pain shortness of breath positive D-dimer EXAM: CT ANGIOGRAPHY CHEST CT ABDOMEN AND PELVIS WITH CONTRAST TECHNIQUE: Multidetector CT imaging of the chest was performed using the standard protocol during bolus administration of intravenous contrast. Multiplanar CT image reconstructions and MIPs were obtained to evaluate the vascular anatomy. Multidetector CT imaging of the abdomen and pelvis was performed using the standard protocol during bolus administration of intravenous contrast. RADIATION DOSE REDUCTION: This exam was performed according to the departmental dose-optimization program which includes automated exposure control, adjustment of the mA and/or kV according to patient size and/or use of iterative reconstruction technique. CONTRAST:  75mL OMNIPAQUE IOHEXOL 350 MG/ML SOLN COMPARISON:  CT 10/20/2021 FINDINGS: CTA CHEST FINDINGS Cardiovascular:  Slightly suboptimal opacification of the pulmonary arteries to the segmental level. No  evidence of pulmonary embolism. Normal heart size. No pericardial effusion. Nonaneurysmal aorta. Mediastinum/Nodes: Patent trachea. Slightly enlarged thyroid. Scattered thyroid nodules, largest seen in the inferior left lobe measuring 14 mm, no specific imaging follow-up is recommended. No suspicious lymph nodes. Esophagus within normal limits Lungs/Pleura: Minimal atelectasis at the right middle lobe. No acute airspace disease, pleural effusion or pneumothorax Musculoskeletal: No acute osseous abnormality Review of the MIP images confirms the above findings. CT ABDOMEN and PELVIS FINDINGS Hepatobiliary: Liver is enlarged with craniocaudal measurement of 23 cm. Diffuse decreased hepatic density consistent with steatosis. No calcified gallstone or biliary dilatation Pancreas: Unremarkable. No pancreatic ductal dilatation or surrounding inflammatory changes. Spleen: Normal in size without focal abnormality. Adrenals/Urinary Tract: Adrenal glands are normal. Diffuse hypoenhancement of the left kidney. Mild to moderate left hydronephrosis and proximal hydroureter. No obstructing stone. The bladder is unremarkable Stomach/Bowel: The stomach is nonenlarged. No dilated small bowel. No acute bowel wall thickening. Negative appendix Vascular/Lymphatic: Nonaneurysmal aorta.  No suspicious lymph nodes. Reproductive: Uterus unremarkable. Complex left adnexal cyst measuring 4.4 by 3.7 cm. Other: Negative for free air. Musculoskeletal: Heterogenous left greater than right psoas muscle enlargement which appears slightly hyperdense. On the left, this extends to the left iliacus muscle where there is also stranding. No active extravasation. Abnormal prevertebral soft tissue thickening extending from about L3-L4 to the L5-S1 level. There is endplate destructive change at L4-L5 and heterogeneous sclerosis at the L4, L5 and S1 vertebral bodies which  is new compared to the prior CT. Review of the MIP images confirms the above findings. IMPRESSION: 1. Negative for acute pulmonary embolus. 2. Diffuse hypoenhancement of the left kidney with mild to moderate left hydronephrosis and proximal hydroureter. No obstructing stone. Source of obstruction appears to be heterogenous soft tissue thickening in the left paraspinal region subsequently discussed. 3. Heterogenous left greater than right psoas muscle enlargement which appears slightly hyperdense. On the left, this extends to the left iliacus muscle where there is also stranding. The overall appearance is suspect for retroperitoneal hematoma. There is no active extravasation on this exam. 4. Abnormal prevertebral soft tissue thickening extending from about L3-L4 to the L5-S1 level with endplate destructive change at L4-L5 and heterogeneous sclerosis at the L4, L5 and S1 vertebral bodies. Findings are highly suspicious for discitis/osteomyelitis. MRI of the lumbar spine with and without contrast is recommended for further evaluation. 5. Hepatomegaly with hepatic steatosis. 6. Complex left adnexal cyst measuring up to 4.4 cm. Correlation with pelvic ultrasound is suggested. Electronically Signed   By: Jasmine Pang M.D.   On: 11/10/2023 22:56   DG Chest 2 View Result Date: 11/10/2023 CLINICAL DATA:  Shortness of breath for 2 weeks. EXAM: CHEST - 2 VIEW COMPARISON:  10/20/2021 FINDINGS: The cardiomediastinal contours are normal. The lungs are clear. Pulmonary vasculature is normal. No consolidation, pleural effusion, or pneumothorax. No acute osseous abnormalities are seen. IMPRESSION: Negative radiographs of the chest. Electronically Signed   By: Narda Rutherford M.D.   On: 11/10/2023 17:24   (Echo, Carotid, EGD, Colonoscopy, ERCP)    Subjective: Patient seen and examined in the morning rounds.  She does have some hip pain but denies any other complaints.  Mild numbness remains on the lateral aspect of the left  thigh.  Walking around in the hallway without assistance.  Bowel movements are regulated.   Discharge Exam: Vitals:   11/19/23 0530 11/19/23 1418  BP: 125/83 115/71  Pulse: 95   Resp: 17 18  Temp: 98.7 F (37.1 C) 98.8  F (37.1 C)  SpO2: 96% 96%   Vitals:   11/18/23 1958 11/19/23 0530 11/19/23 0642 11/19/23 1418  BP: (!) 147/94 125/83  115/71  Pulse: 99 95    Resp: 17 17  18   Temp: 98.1 F (36.7 C) 98.7 F (37.1 C)  98.8 F (37.1 C)  TempSrc: Oral Oral    SpO2: 98% 96%  96%  Weight:   101.2 kg   Height:        General: Pt is alert, awake, not in acute distress Cardiovascular: RRR, S1/S2 +, no rubs, no gallops Respiratory: CTA bilaterally, no wheezing, no rhonchi Abdominal: Soft, NT, ND, bowel sounds + Extremities: no edema, no cyanosis    The results of significant diagnostics from this hospitalization (including imaging, microbiology, ancillary and laboratory) are listed below for reference.     Microbiology: Recent Results (from the past 240 hours)  Resp panel by RT-PCR (RSV, Flu A&B, Covid) Anterior Nasal Swab     Status: None   Collection Time: 11/10/23  4:15 PM   Specimen: Anterior Nasal Swab  Result Value Ref Range Status   SARS Coronavirus 2 by RT PCR NEGATIVE NEGATIVE Final   Influenza A by PCR NEGATIVE NEGATIVE Final   Influenza B by PCR NEGATIVE NEGATIVE Final    Comment: (NOTE) The Xpert Xpress SARS-CoV-2/FLU/RSV plus assay is intended as an aid in the diagnosis of influenza from Nasopharyngeal swab specimens and should not be used as a sole basis for treatment. Nasal washings and aspirates are unacceptable for Xpert Xpress SARS-CoV-2/FLU/RSV testing.  Fact Sheet for Patients: BloggerCourse.com  Fact Sheet for Healthcare Providers: SeriousBroker.it  This test is not yet approved or cleared by the [place] FDA and has been authorized for detection and/or diagnosis of SARS-CoV-2 by FDA under  an Emergency Use Authorization (EUA). This EUA will remain in effect (meaning this test can be used) for the duration of the COVID-19 declaration under Section 564(b)(1) of the Act, 21 U.S.C. section 360bbb-3(b)(1), unless the authorization is terminated or revoked.     Resp Syncytial Virus by PCR NEGATIVE NEGATIVE Final    Comment: (NOTE) Fact Sheet for Patients: BloggerCourse.com  Fact Sheet for Healthcare Providers: SeriousBroker.it  This test is not yet approved or cleared by the [place] FDA and has been authorized for detection and/or diagnosis of SARS-CoV-2 by FDA under an Emergency Use Authorization (EUA). This EUA will remain in effect (meaning this test can be used) for the duration of the COVID-19 declaration under Section 564(b)(1) of the Act, 21 U.S.C. section 360bbb-3(b)(1), unless the authorization is terminated or revoked.  Performed at [facility], [address] [place], [place] [address]   Blood culture (routine x 2)     Status: None   Collection Time: 11/10/23 11:03 PM   Specimen: BLOOD  Result Value Ref Range Status   Specimen Description BLOOD LEFT ANTECUBITAL  Final   Special Requests   Final    BOTTLES DRAWN AEROBIC AND ANAEROBIC Blood Culture adequate volume   Culture   Final    NO GROWTH 5 DAYS Performed at [facility], [address], [place], [place] [address]    Report Status 11/15/2023 FINAL  Final  Blood culture (routine x 2)     Status: None   Collection Time: 11/11/23 12:57 AM   Specimen: BLOOD RIGHT HAND  Result Value Ref Range Status   Specimen Description BLOOD RIGHT HAND  Final   Special Requests   Final  BOTTLES DRAWN AEROBIC AND ANAEROBIC Blood Culture results may not be optimal due to an inadequate volume of blood received in culture bottles   Culture   Final    NO GROWTH 5 DAYS Performed at [facility], [address], [place], [place] [address]     Report Status 11/16/2023 FINAL  Final  MRSA Next Gen by PCR, Nasal     Status: Abnormal   Collection Time: 11/11/23  3:40 AM   Specimen: Nasal Mucosa; Nasal Swab  Result Value Ref Range Status   MRSA by PCR Next Gen DETECTED (A) NOT DETECTED Final    Comment: RESULT CALLED TO, READ BACK BY AND VERIFIED WITH: RN Isabelle Course on [phone] @0925  by SM (NOTE) The GeneXpert MRSA Assay (FDA approved for NASAL specimens only), is one component of a comprehensive MRSA colonization surveillance program. It is not intended to diagnose MRSA infection nor to guide or monitor treatment for MRSA infections. Test performance is not FDA approved in patients less than 43 years old. Performed at [facility], [address], [place], [place] [address]   Aerobic/Anaerobic Culture w Gram Stain (surgical/deep wound)     Status: None   Collection Time: 11/12/23  4:43 PM   Specimen: Abscess  Result Value Ref Range Status   Specimen Description ABSCESS  Final   Special Requests INTERVERTEBRAL DISC  Final   Gram Stain NO WBC SEEN NO ORGANISMS SEEN   Final   Culture   Final    No growth aerobically or anaerobically. Performed at [facility], [address], [place], [place] [address]    Report Status 11/17/2023 FINAL  Final  Fungus Culture With Stain     Status: None (Preliminary result)   Collection Time: 11/12/23  4:43 PM  Result Value Ref Range Status   Fungus Stain Final report  Final    Comment: (NOTE) Performed At: [facility] [address] [place], [place] [id number] Jolene Schimke MD [id number]    Fungus (Mycology) Culture PENDING  Incomplete   Fungal Source ABSCESS  Final    Comment: INTERVERTEBRAL Performed at [facility], [address] [place], [place] [address]   Fungus Culture Result     Status: None   Collection Time: 11/12/23  4:43 PM  Result Value Ref Range Status   Result 1 Comment  Final    Comment: (NOTE) KOH/Calcofluor preparation:  no fungus  observed. Performed At: [facility] [address] [place], [place] [id number] Jolene Schimke MD [id number]      Labs: BNP (last 3 results) No results for input(s): "BNP" in the last 8760 hours. Basic Metabolic Panel: Recent Labs  Lab 11/14/23 0541 11/16/23 0426 11/18/23 0652  NA 135 132* 133*  K 4.0 3.5 4.1  CL 100 96* 96*  CO2 25 24 26   GLUCOSE 201* 238* 171*  BUN 13 13 14   CREATININE 0.75 0.79 0.76  CALCIUM 8.6* 8.6* 8.5*   Liver Function Tests: No results for input(s): "AST", "ALT", "ALKPHOS", "BILITOT", "PROT", "ALBUMIN" in the last 168 hours. No results for input(s): "LIPASE", "AMYLASE" in the last 168 hours. No results for input(s): "AMMONIA" in the last 168 hours. CBC: Recent Labs  Lab 11/14/23 0541 11/16/23 0426 11/18/23 0652  WBC 9.3 6.9 6.8  HGB 7.7* 7.2* 7.0*  HCT 25.7* 24.7* 24.4*  MCV 73.4* 74.8* 74.8*  PLT 458* 424* 429*   Cardiac Enzymes: Recent Labs  Lab 11/13/23 0637  CKTOTAL 11*   BNP:  Invalid input(s): "POCBNP" CBG: Recent Labs  Lab 11/18/23 1202 11/18/23 1724 11/18/23 2121 11/19/23 0646 11/19/23 1146  GLUCAP 203* 213* 134* 165* 209*   D-Dimer No results for input(s): "DDIMER" in the last 72 hours. Hgb A1c No results for input(s): "HGBA1C" in the last 72 hours. Lipid Profile No results for input(s): "CHOL", "HDL", "LDLCALC", "TRIG", "CHOLHDL", "LDLDIRECT" in the last 72 hours. Thyroid function studies No results for input(s): "TSH", "T4TOTAL", "T3FREE", "THYROIDAB" in the last 72 hours.  Invalid input(s): "FREET3" Anemia work up No results for input(s): "VITAMINB12", "FOLATE", "FERRITIN", "TIBC", "IRON", "RETICCTPCT" in the last 72 hours. Urinalysis    Component Value Date/Time   COLORURINE STRAW (A) 11/10/2023 2302   APPEARANCEUR CLEAR 11/10/2023 2302   LABSPEC >1.046 (H) 11/10/2023 2302   PHURINE 6.0 11/10/2023 2302   GLUCOSEU NEGATIVE 11/10/2023 2302   HGBUR NEGATIVE 11/10/2023 2302   HGBUR negative  11/15/2009 0944   BILIRUBINUR NEGATIVE 11/10/2023 2302   BILIRUBINUR NEG 12/04/2014 1047   KETONESUR NEGATIVE 11/10/2023 2302   PROTEINUR NEGATIVE 11/10/2023 2302   UROBILINOGEN 1.0 07/27/2020 1534   NITRITE NEGATIVE 11/10/2023 2302   LEUKOCYTESUR NEGATIVE 11/10/2023 2302   Sepsis Labs Recent Labs  Lab 11/14/23 0541 11/16/23 0426 11/18/23 0652  WBC 9.3 6.9 6.8   Microbiology Recent Results (from the past 240 hours)  Resp panel by RT-PCR (RSV, Flu A&B, Covid) Anterior Nasal Swab     Status: None   Collection Time: 11/10/23  4:15 PM   Specimen: Anterior Nasal Swab  Result Value Ref Range Status   SARS Coronavirus 2 by RT PCR NEGATIVE NEGATIVE Final   Influenza A by PCR NEGATIVE NEGATIVE Final   Influenza B by PCR NEGATIVE NEGATIVE Final    Comment: (NOTE) The Xpert Xpress SARS-CoV-2/FLU/RSV plus assay is intended as an aid in the diagnosis of influenza from Nasopharyngeal swab specimens and should not be used as a sole basis for treatment. Nasal washings and aspirates are unacceptable for Xpert Xpress SARS-CoV-2/FLU/RSV testing.  Fact Sheet for Patients: BloggerCourse.com  Fact Sheet for Healthcare Providers: SeriousBroker.it  This test is not yet approved or cleared by the [place] FDA and has been authorized for detection and/or diagnosis of SARS-CoV-2 by FDA under an Emergency Use Authorization (EUA). This EUA will remain in effect (meaning this test can be used) for the duration of the COVID-19 declaration under Section 564(b)(1) of the Act, 21 U.S.C. section 360bbb-3(b)(1), unless the authorization is terminated or revoked.     Resp Syncytial Virus by PCR NEGATIVE NEGATIVE Final    Comment: (NOTE) Fact Sheet for Patients: BloggerCourse.com  Fact Sheet for Healthcare Providers: SeriousBroker.it  This test is not yet approved or cleared by the [place]  FDA and has been authorized for detection and/or diagnosis of SARS-CoV-2 by FDA under an Emergency Use Authorization (EUA). This EUA will remain in effect (meaning this test can be used) for the duration of the COVID-19 declaration under Section 564(b)(1) of the Act, 21 U.S.C. section 360bbb-3(b)(1), unless the authorization is terminated or revoked.  Performed at [facility], [address] [place], [place] [address]   Blood culture (routine x 2)     Status: None   Collection Time: 11/10/23 11:03 PM   Specimen: BLOOD  Result Value Ref Range Status   Specimen Description BLOOD LEFT ANTECUBITAL  Final   Special Requests   Final    BOTTLES DRAWN AEROBIC AND ANAEROBIC Blood Culture adequate volume   Culture   Final  NO GROWTH 5 DAYS Performed at [facility], [address], [place], [place] [address]    Report Status 11/15/2023 FINAL  Final  Blood culture (routine x 2)     Status: None   Collection Time: 11/11/23 12:57 AM   Specimen: BLOOD RIGHT HAND  Result Value Ref Range Status   Specimen Description BLOOD RIGHT HAND  Final   Special Requests   Final    BOTTLES DRAWN AEROBIC AND ANAEROBIC Blood Culture results may not be optimal due to an inadequate volume of blood received in culture bottles   Culture   Final    NO GROWTH 5 DAYS Performed at [facility], [address] [place], [place] [address]    Report Status 11/16/2023 FINAL  Final  MRSA Next Gen by PCR, Nasal     Status: Abnormal   Collection Time: 11/11/23  3:40 AM   Specimen: Nasal Mucosa; Nasal Swab  Result Value Ref Range Status   MRSA by PCR Next Gen DETECTED (A) NOT DETECTED Final    Comment: RESULT CALLED TO, READ BACK BY AND VERIFIED WITH: RN Lydia on [phone]  by SM (NOTE) The GeneXpert MRSA Assay (FDA approved for NASAL specimens only), is one component of a comprehensive MRSA colonization surveillance program. It is not intended to diagnose MRSA infection nor to guide or  monitor treatment for MRSA infections. Test performance is not FDA approved in patients less than 25 years old. Performed at [facility], [address], [place], [place] [address]   Aerobic/Anaerobic Culture w Gram Stain (surgical/deep wound)     Status: None   Collection Time: 11/12/23  4:43 PM   Specimen: Abscess  Result Value Ref Range Status   Specimen Description ABSCESS  Final   Special Requests INTERVERTEBRAL DISC  Final   Gram Stain NO WBC SEEN NO ORGANISMS SEEN   Final   Culture   Final    No growth aerobically or anaerobically. Performed at [facility], [address], [place], [place] [address]    Report Status 11/17/2023 FINAL  Final  Fungus Culture With Stain     Status: None (Preliminary result)   Collection Time: 11/12/23  4:43 PM  Result Value Ref Range Status   Fungus Stain Final report  Final    Comment: (NOTE) Performed At: [facility] [address] [place], [place] [id number] Pearlean Botts MD [id number]    Fungus (Mycology) Culture PENDING  Incomplete   Fungal Source ABSCESS  Final    Comment: INTERVERTEBRAL Performed at [facility], [address], [place], [place] [address]   Fungus Culture Result     Status: None   Collection Time: 11/12/23  4:43 PM  Result Value Ref Range Status   Result 1 Comment  Final    Comment: (NOTE) KOH/Calcofluor preparation:  no fungus observed. Performed At: [facility] [address] [place], [place] [id number] Pearlean Botts MD [id number]      Time coordinating discharge: 35 minutes  SIGNED:   Vada Garibaldi, MD  [facility] 11/19/2023, 4:14 PM

## 2023-11-19 NOTE — Plan of Care (Signed)
   Problem: Education: Goal: Knowledge of General Education information will improve Description: Including pain rating scale, medication(s)/side effects and non-pharmacologic comfort measures Outcome: Progressing   Problem: Clinical Measurements: Goal: Will remain free from infection Outcome: Progressing

## 2023-11-20 DIAGNOSIS — M4626 Osteomyelitis of vertebra, lumbar region: Secondary | ICD-10-CM | POA: Diagnosis not present

## 2023-11-20 LAB — BASIC METABOLIC PANEL WITH GFR
Anion gap: 11 (ref 5–15)
BUN: 13 mg/dL (ref 6–20)
CO2: 25 mmol/L (ref 22–32)
Calcium: 8.6 mg/dL — ABNORMAL LOW (ref 8.9–10.3)
Chloride: 98 mmol/L (ref 98–111)
Creatinine, Ser: 0.92 mg/dL (ref 0.44–1.00)
GFR, Estimated: >60 mL/min (ref >60)
Glucose, Bld: 205 mg/dL — ABNORMAL HIGH (ref 70–99)
Potassium: 3.5 mmol/L (ref 3.5–5.1)
Sodium: 134 mmol/L — ABNORMAL LOW (ref 135–145)

## 2023-11-20 LAB — GLUCOSE, CAPILLARY
Glucose-Capillary: 219 mg/dL — ABNORMAL HIGH (ref 70–99)
Glucose-Capillary: 240 mg/dL — ABNORMAL HIGH (ref 70–99)
Glucose-Capillary: 143 mg/dL — ABNORMAL HIGH (ref 70–99)
Glucose-Capillary: 205 mg/dL — ABNORMAL HIGH (ref 70–99)

## 2023-11-20 LAB — CBC
HCT: 23.5 % — ABNORMAL LOW (ref 36.0–46.0)
Hemoglobin: 6.7 g/dL — CL (ref 12.0–15.0)
MCH: 21.8 pg — ABNORMAL LOW (ref 26.0–34.0)
MCHC: 28.5 g/dL — ABNORMAL LOW (ref 30.0–36.0)
MCV: 76.5 fL — ABNORMAL LOW (ref 80.0–100.0)
Platelets: 397 10*3/uL (ref 150–400)
RBC: 3.07 MIL/uL — ABNORMAL LOW (ref 3.87–5.11)
RDW: 15.1 % (ref 11.5–15.5)
WBC: 6.9 10*3/uL (ref 4.0–10.5)
nRBC: 0 % (ref 0.0–0.2)

## 2023-11-20 LAB — PREPARE RBC (CROSSMATCH)

## 2023-11-20 MED ORDER — GABAPENTIN 400 MG PO CAPS: 400.0000 mg | ORAL_CAPSULE | Freq: Three times a day (TID) | ORAL | 0 refills | Status: AC

## 2023-11-20 MED ORDER — GABAPENTIN 400 MG PO CAPS
400.0000 mg | ORAL_CAPSULE | Freq: Three times a day (TID) | ORAL | Status: DC
  Administered 2023-11-20 – 2023-11-21 (×3): 400 mg via ORAL
  Filled 2023-11-20: qty 1
  Filled 2023-11-20: qty 4
  Filled 2023-11-20: qty 1

## 2023-11-20 MED ORDER — SODIUM CHLORIDE 0.9% IV SOLUTION: Freq: Once | INTRAVENOUS | Status: DC

## 2023-11-20 NOTE — TOC Progression Note (Addendum)
 Transition of Care [facility]) - Progression Note    Patient Details  Name: Krystal [name] MRN: [id number] Date of Birth: [birth year]  [facility] CM/SW Contact  Elspeth Hals, LCSW Phone Number: 11/20/2023, 10:18 AM  Clinical Narrative:   Pt now requiring blood.  No DC by 10am.  Officer [place]/Sheriff office informed.  Cell: [phone].  Per [employer], will need [facility] will need to be given on the date that pt will transfer.  Transfer acceptance from yesterday does not work.    1100: TC Nicole/[place]. MD at [facility] today is Dr Reford Canterbury, [phone].  They are requesting Dr Hilton Lucky touch base with Dr Reford Canterbury.  MD notified.    Expected Discharge Plan: Corrections Facility Barriers to Discharge: Continued Medical Work up  Expected Discharge Plan and Services   Discharge Planning Services: CM Consult     Expected Discharge Date: 11/20/23                                     Social Determinants of Health (SDOH) Interventions SDOH Screenings   Food Insecurity: No Food Insecurity (11/11/2023)  Housing: Low Risk  (11/11/2023)  Transportation Needs: No Transportation Needs (11/11/2023)  Utilities: Not At Risk (11/11/2023)  Alcohol Screen: Low Risk  (05/17/2020)  Depression (PHQ2-9): Medium Risk (10/19/2021)  Tobacco Use: High Risk (11/11/2023)    Readmission Risk Interventions     No data to display

## 2023-11-20 NOTE — Progress Notes (Signed)
 PROGRESS NOTE    Krystal [name]  ZOX:[id number] DOB: 1986/06/25 DOA: 11/10/2023 PCP: Lorayne Bender, MD    Brief Narrative:  38 year old with type 2 diabetes on insulin, history of opiate abuse presented from jail with worsening generalized weakness and intermittent low back pain for 6 months.  Hemodynamically stable and neurologically stable in the ER.  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  Admitted with ID consultation.  Subjective:  Patient seen and examined.  Today she has no complaints. Received PICC line. Multiple calls from physician at medical facility that were supposed to take him. Discussed with Dr. Zenaida Niece today who told me that patient is not a safe keeper so they cannot accept him. Patient will be staying in the hospital until the jail system figures it out.   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  Neural foraminal narrowing. Bilateral psoas muscle edema consistent with myositis  Seen by neurosurgery, recommended no surgical intervention. IR aspirated fluid, blood cultures and aspirates are negative so far. TTE with no evidence of endocarditis.  With negative blood cultures, does not need TEE. Seen by ID, started on daptomycin and ceftriaxone. With negative cultures recommended 6 weeks of IV antibiotics. Adequate pain medications.   Pain management with oral opiates, Robaxin 1000 mg 3 times daily, gabapentin 400 mg 3 times daily.  Chronic microcytic anemia: Hemoglobin 6.7.  Supplemental iron.  No evidence of active bleeding.  With serum and low hemoglobin, will benefit with transfusion.  Patient consented for 1 unit transfusion.  B12 deficiency: Started supplementation.  Will continue on discharge.  Type 2 diabetes: Blood sugars started to elevate.  Increase dose of long-acting insulin to 20 units .   Stable today. Added metformin.  Constipation: Increase dose of MiraLAX to twice daily.  Senokot twice daily. Regulated now.  Disposition: Medically stable.  Needs 6 weeks of IV antibiotics.  Need to figure out logistics of receiving antibiotics in the prision before discharge.   DVT prophylaxis: SCDs Start: 11/11/23 0158   Code Status: Full code Family Communication: None at the bedside.  Security personnel at the bedside. Disposition Plan: Status is: Inpatient Remains inpatient appropriate because: IV antibiotics     Consultants:  Infectious disease Neurosurgery  Procedures:  IR guided aspiration  Antimicrobials:  Daptomycin and ceftriaxone 4/8     Objective: Vitals:   11/19/23 1418 11/19/23 2004 11/20/23 0513 11/20/23 0726  BP: 115/71 113/74 123/85 (!) 124/94  Pulse:  (!) 108 87 89  Resp: 18 16 15 17   Temp: 98.8 F (37.1 C) 98.6 F (37 C) 97.9 F (36.6 C) 97.7 F (36.5 C)  TempSrc:  Oral Oral Oral  SpO2: 96% 100% 97% 99%  Weight:      Height:        Intake/Output Summary (Last 24 hours) at 11/20/2023 1417 Last data filed at 11/20/2023 1030 Gross per 24 hour  Intake 1430 ml  Output 7 ml  Net 1423 ml   Filed Weights   11/12/23 0500 11/16/23 0618 11/19/23 0642  Weight: 96.5 kg 97.9 kg 101.2 kg    Examination:  General exam: Appears calm and comfortable  Respiratory system: Clear to auscultation. Respiratory effort normal. Cardiovascular system: S1 & S2 heard, RRR. Marland Kitchen Gastrointestinal system: Soft.  Nontender.  Bowel sound present. Central nervous system: Alert and oriented. No focal neurological deficits. Extremities: Symmetric 5 x 5 power.  Data Reviewed: I have personally reviewed following labs and imaging studies  CBC: Recent Labs  Lab 11/14/23 0541 11/16/23 0426 11/18/23 0652 11/20/23 0509  WBC 9.3 6.9 6.8 6.9  HGB 7.7* 7.2* 7.0* 6.7*  HCT 25.7* 24.7* 24.4* 23.5*  MCV 73.4* 74.8* 74.8* 76.5*  PLT 458* 424* 429* 397   Basic  Metabolic Panel: Recent Labs  Lab 11/14/23 0541 11/16/23 0426 11/18/23 0652 11/20/23 0509  NA 135 132* 133* 134*  K 4.0 3.5 4.1 3.5  CL 100 96* 96* 98  CO2 25 24 26 25   GLUCOSE 201* 238* 171* 205*  BUN 13 13 14 13   CREATININE 0.75 0.79 0.76 0.92  CALCIUM 8.6* 8.6* 8.5* 8.6*   GFR: Estimated Creatinine Clearance: 98.8 mL/min (by C-G formula based on SCr of 0.92 mg/dL). Liver Function Tests: No results for input(s): "AST", "ALT", "ALKPHOS", "BILITOT", "PROT", "ALBUMIN" in the last 168 hours.  No results for input(s): "LIPASE", "AMYLASE" in the last 168 hours.  No results for input(s): "AMMONIA" in the last 168 hours. Coagulation Profile: No results for input(s): "INR", "PROTIME" in the last 168 hours.  Cardiac Enzymes: No results for input(s): "CKTOTAL", "CKMB", "CKMBINDEX", "TROPONINI" in the last 168 hours.  BNP (last 3 results) No results for input(s): "PROBNP" in the last 8760 hours. HbA1C: No results for input(s): "HGBA1C" in the last 72 hours. CBG: Recent Labs  Lab 11/19/23 1146 11/19/23 1623 11/19/23 2001 11/20/23 0512 11/20/23 1132  GLUCAP 209* 134* 169* 219* 240*   Lipid Profile: No results for input(s): "CHOL", "HDL", "LDLCALC", "TRIG", "CHOLHDL", "LDLDIRECT" in the last 72 hours. Thyroid Function Tests: No results for input(s): "TSH", "T4TOTAL", "FREET4", "T3FREE", "THYROIDAB" in the last 72 hours. Anemia Panel: No results for input(s): "VITAMINB12", "FOLATE", "FERRITIN", "TIBC", "IRON", "RETICCTPCT" in the last 72 hours. Sepsis Labs: No results for input(s): "PROCALCITON", "LATICACIDVEN" in the last 168 hours.   Recent Results (from the past 240 hours)  Resp panel by RT-PCR (RSV, Flu A&B, Covid) Anterior Nasal Swab     Status: None   Collection Time: 11/10/23  4:15 PM   Specimen: Anterior Nasal Swab  Result Value Ref Range Status   SARS Coronavirus 2 by RT PCR NEGATIVE NEGATIVE Final   Influenza A by PCR NEGATIVE NEGATIVE Final   Influenza B by  PCR NEGATIVE NEGATIVE Final    Comment: (NOTE) The Xpert Xpress SARS-CoV-2/FLU/RSV plus assay is intended as an aid in the diagnosis of influenza from Nasopharyngeal swab specimens and should not be used as a sole basis for treatment. Nasal washings and aspirates are unacceptable for Xpert Xpress SARS-CoV-2/FLU/RSV testing.  Fact Sheet for Patients: BloggerCourse.com  Fact Sheet for Healthcare Providers: SeriousBroker.it  This test is not yet approved or cleared by the [place] FDA and has been authorized for detection and/or diagnosis of SARS-CoV-2 by FDA under an Emergency Use Authorization (EUA). This EUA will remain in effect (meaning this test can be used) for the duration of the COVID-19 declaration under Section 564(b)(1) of the Act, 21 U.S.C. section 360bbb-3(b)(1), unless the authorization is terminated or revoked.     Resp Syncytial Virus by PCR NEGATIVE NEGATIVE Final    Comment: (NOTE) Fact Sheet for Patients: BloggerCourse.com  Fact Sheet for Healthcare Providers: SeriousBroker.it  This test is not yet approved or cleared by the [place] FDA and has been authorized for detection and/or diagnosis of SARS-CoV-2 by FDA under an Emergency Use Authorization (EUA). This EUA will remain in effect (meaning this test can be used)  for the duration of the COVID-19 declaration under Section 564(b)(1) of the Act, 21 U.S.C. section 360bbb-3(b)(1), unless the authorization is terminated or revoked.  Performed at [facility], [address], [place], [place] [address]   Blood culture (routine x 2)     Status: None   Collection Time: 11/10/23 11:03 PM   Specimen: BLOOD  Result Value Ref Range Status   Specimen Description BLOOD LEFT ANTECUBITAL  Final   Special Requests   Final    BOTTLES DRAWN AEROBIC AND ANAEROBIC Blood Culture adequate volume   Culture    Final    NO GROWTH 5 DAYS Performed at [facility], [address], [place], [place] [address]    Report Status 11/15/2023 FINAL  Final  Blood culture (routine x 2)     Status: None   Collection Time: 11/11/23 12:57 AM   Specimen: BLOOD RIGHT HAND  Result Value Ref Range Status   Specimen Description BLOOD RIGHT HAND  Final   Special Requests   Final    BOTTLES DRAWN AEROBIC AND ANAEROBIC Blood Culture results may not be optimal due to an inadequate volume of blood received in culture bottles   Culture   Final    NO GROWTH 5 DAYS Performed at [facility], [address] [place], [place] [address]    Report Status 11/16/2023 FINAL  Final  MRSA Next Gen by PCR, Nasal     Status: Abnormal   Collection Time: 11/11/23  3:40 AM   Specimen: Nasal Mucosa; Nasal Swab  Result Value Ref Range Status   MRSA by PCR Next Gen DETECTED (A) NOT DETECTED Final    Comment: RESULT CALLED TO, READ BACK BY AND VERIFIED WITH: RN Lydia on [phone] @0925  by SM (NOTE) The GeneXpert MRSA Assay (FDA approved for NASAL specimens only), is one component of a comprehensive MRSA colonization surveillance program. It is not intended to diagnose MRSA infection nor to guide or monitor treatment for MRSA infections. Test performance is not FDA approved in patients less than 86 years old. Performed at [facility], [address], [place], [place] [address]   Aerobic/Anaerobic Culture w Gram Stain (surgical/deep wound)     Status: None   Collection Time: 11/12/23  4:43 PM   Specimen: Abscess  Result Value Ref Range Status   Specimen Description ABSCESS  Final   Special Requests INTERVERTEBRAL DISC  Final   Gram Stain NO WBC SEEN NO ORGANISMS SEEN   Final   Culture   Final    No growth aerobically or anaerobically. Performed at [facility], [address] [place], [place] [address]    Report Status 11/17/2023 FINAL  Final  Fungus Culture With Stain     Status: None (Preliminary  result)   Collection Time: 11/12/23  4:43 PM  Result Value Ref Range Status   Fungus Stain Final report  Final    Comment: (NOTE) Performed At: [facility] [address] [place], [place] [id number] Jolene Schimke MD LK:[id number]    Fungus (Mycology) Culture PENDING  Incomplete   Fungal Source ABSCESS  Final    Comment: INTERVERTEBRAL Performed at [facility], [address] [place], [place] [address]   Fungus Culture Result     Status: None   Collection Time: 11/12/23  4:43 PM  Result Value Ref Range Status   Result 1 Comment  Final    Comment: (NOTE) KOH/Calcofluor preparation:  no fungus observed.  Performed At: [facility] [address] [place], [place] [id number] Pearlean Botts MD WU:[id number]          Radiology Studies: US  EKG SITE RITE Result Date: 11/19/2023 If Site Rite image not attached, placement could not be confirmed due to current cardiac rhythm.        Scheduled Meds:  sodium chloride   Intravenous Once   busPIRone  30 mg Oral BID   Chlorhexidine Gluconate Cloth  6 each Topical Daily   vitamin B-12  1,000 mcg Oral Daily   enoxaparin (LOVENOX) injection  50 mg Subcutaneous Q24H   ferrous sulfate  325 mg Oral TID WC   gabapentin  200 mg Oral TID   hydrOXYzine  100 mg Oral BID   insulin aspart  0-5 Units Subcutaneous QHS   insulin aspart  0-9 Units Subcutaneous TID WC   insulin glargine-yfgn  20 Units Subcutaneous QHS   metFORMIN  500 mg Oral BID WC   methocarbamol  1,000 mg Oral TID   polyethylene glycol  17 g Oral BID   prazosin  2 mg Oral QHS   senna-docusate  1 tablet Oral BID   sertraline  100 mg Oral QHS   sodium chloride flush  10-40 mL Intracatheter Q12H   topiramate  50 mg Oral BID   Continuous Infusions:  cefTRIAXone (ROCEPHIN)  IV 2 g (11/19/23 2102)   DAPTOmycin 700 mg (11/20/23 1341)     LOS: 9 days    Time spent: 35 minutes    Vada Garibaldi, MD [facility]

## 2023-11-20 NOTE — Progress Notes (Signed)
 PT Cancellation Note  Patient Details Name: TORRANCE FRECH MRN: [id number] DOB: Feb 16, 1986   Cancelled Treatment:    Reason Eval/Treat Not Completed: Other (comment). Pt currently receiving blood transfusion due to anemia. PT will follow up as time allows.    Rexie Catena 11/20/2023, 4:02 PM

## 2023-11-21 DIAGNOSIS — M4626 Osteomyelitis of vertebra, lumbar region: Secondary | ICD-10-CM | POA: Diagnosis not present

## 2023-11-21 LAB — TYPE AND SCREEN
ABO/RH(D): A POS
Antibody Screen: NEGATIVE
Unit division: 0

## 2023-11-21 LAB — GLUCOSE, CAPILLARY
Glucose-Capillary: 202 mg/dL — ABNORMAL HIGH (ref 70–99)
Glucose-Capillary: 179 mg/dL — ABNORMAL HIGH (ref 70–99)

## 2023-11-21 LAB — HEMOGLOBIN AND HEMATOCRIT, BLOOD
HCT: 27.1 % — ABNORMAL LOW (ref 36.0–46.0)
Hemoglobin: 7.9 g/dL — ABNORMAL LOW (ref 12.0–15.0)

## 2023-11-21 LAB — BPAM RBC
Blood Product Expiration Date: 202505132359
ISSUE DATE / TIME: 202504151420
Unit Type and Rh: 6200

## 2023-11-21 MED ORDER — METFORMIN HCL 500 MG PO TABS: 1000.0000 mg | ORAL_TABLET | Freq: Two times a day (BID) | ORAL | Status: DC

## 2023-11-21 MED ORDER — METFORMIN HCL ER 500 MG PO TB24: 1000.0000 mg | ORAL_TABLET | Freq: Every day | ORAL | Status: AC

## 2023-11-21 MED ORDER — INSULIN GLARGINE 100 UNIT/ML SOLOSTAR PEN: 22.0000 [IU] | PEN_INJECTOR | Freq: Every evening | SUBCUTANEOUS | Status: AC

## 2023-11-21 MED ORDER — INSULIN GLARGINE-YFGN 100 UNIT/ML ~~LOC~~ SOLN
22.0000 [IU] | Freq: Every day | SUBCUTANEOUS | Status: DC
  Filled 2023-11-21: qty 0.22

## 2023-11-21 NOTE — Discharge Summary (Signed)
 Physician Discharge Summary  Krystal [name] ZOX:[id number] DOB: 1985/09/01 DOA: 11/10/2023  PCP: Lorayne Bender, MD  Admit date: 11/10/2023 Discharge date: 11/21/2023  Admitted From: Prison    Disposition:  Prison   Recommendations for Outpatient Follow-up:  Follow up with PCP in 1-2 weeks Check CBC, BMP, CPK, CRP and ESR every week. Remove PICC line at the end of therapy.  Discharge Condition: Stable CODE STATUS: Full code Diet recommendation: Low-carb diet  Discharge summary: 38 year old with type 2 diabetes on insulin, history of opiate abuse presented from jail with worsening generalized weakness and intermittent low back pain for 6 months.  Hemodynamically stable and neurologically stable in the ER.  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  Admitted with neurosurgery and ID consultation.   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  Neural foraminal narrowing. Bilateral psoas muscle edema consistent with myositis   Seen by neurosurgery, recommended no surgical intervention. IR aspirated fluid, blood cultures and aspirates are negative so far. TTE with no evidence of endocarditis.  With negative blood cultures, does not need TEE. Seen by ID, started on daptomycin and ceftriaxone. With negative cultures recommended 6 weeks of IV antibiotics. Pain management with oral opiates, add Robaxin 1000 mg 3 times daily, gabapentin 200 mg 3 times daily. Antibiotic recommendations daptomycin and ceftriaxone until 12/24/2023 through PICC line. Drug monitoring to be done by correctional facility physician, CBC, BMP, CK, ESR, CRP every week. Will prescribe multimodal pain medications, gabapentin, Robaxin, oxycodone as needed.  Tylenol.   Chronic microcytic anemia: Hemoglobin 7-6.7.  Supplemental iron.  No evidence of active bleeding.   Patient was given 1 unit of PRBC.  She will be discharged on iron and B12 supplementation.   B12 deficiency: Started supplementation.  Will prescribe on discharge.   Type 2 diabetes: Noncompliant with treatment.  Currently on long-acting insulin 22 units, metformin 1000 mg twice daily.   Constipation: Scheduled dose of MiraLAX and Senokot.  Regulated now.   Disposition: Medically stable.  Needs 6 weeks of IV antibiotics until 12/24/2023. Multiple communication with [facility] facility, [facility] correctional facility.  They were able to manage IV antibiotics.   Patient has a PICC line.  Needs to be removed after completion of antibiotic therapy.  Stable for discharge.  Discharge Diagnoses:  Principal Problem:   Osteomyelitis of lumbar spine (HCC) Active Problems:   Depression   Diarrhea   DM2 (diabetes mellitus, type 2) (HCC)   Moderate episode of recurrent major depressive disorder (HCC)   Type 2 diabetes mellitus in pregnancy   Abscess in epidural space of lumbar spine   Low back pain   Hydroureteronephrosis   AKI (acute kidney injury) (HCC)   Generalized weakness   Microcytic anemia   Epidural abscess   In police custody   Vertebral osteomyelitis [facility])    Discharge Instructions  Discharge Instructions     [facility] pharmacist to adjust dose for Vancomycin, Aminoglycosides and other anti-infective therapies as requested by physician.   Complete by: As directed    [facility] to provide Cath Flo 2mg    Complete by: As directed    Administer for PICC line occlusion and as ordered by physician for other access device issues.   Anaphylaxis Kit: Provided to treat any anaphylactic reaction to the medication being provided to the patient if First Dose or when requested by  physician   Complete by: As directed    Epinephrine 1mg /ml vial / amp: Administer 0.3mg  (0.26ml) subcutaneously once for moderate to severe anaphylaxis, nurse to call  physician and pharmacy when reaction occurs and call 911 if needed for immediate care   Diphenhydramine 50mg /ml IV vial: Administer 25-50mg  IV/IM PRN for first dose reaction, rash, itching, mild reaction, nurse to call physician and pharmacy when reaction occurs   Sodium Chloride 0.9% NS IV: Administer if needed for hypovolemic blood pressure drop or as ordered by physician after call to physician with anaphylactic reaction   Change dressing on IV access line weekly and PRN   Complete by: As directed    Diet - low sodium heart healthy   Complete by: As directed    Discharge instructions   Complete by: As directed    Check CBC, BMP, CRP, ESR and CPK every week   Flush IV access with Sodium Chloride 0.9% and Heparin 10 units/ml or 100 units/ml   Complete by: As directed    Home infusion instructions - [facility]   Complete by: As directed    Instructions: Flush IV access with Sodium Chloride 0.9% and Heparin 10units/ml or 100units/ml   Change dressing on IV access line: Weekly and PRN   Instructions Cath Flo 2mg : Administer for PICC Line occlusion and as ordered by physician for other access device   [facility] pharmacist to adjust dose for: Vancomycin, Aminoglycosides and other anti-infective therapies as requested by physician   Increase activity slowly   Complete by: As directed    Method of administration may be changed at the discretion of home infusion pharmacist based upon assessment of the patient and/or caregiver's ability to self-administer the medication ordered   Complete by: As directed    No wound care   Complete by: As directed    No wound care   Complete by: As directed       Allergies as of 11/21/2023   No Known Allergies      Medication List     TAKE these medications    acetaminophen 325 MG tablet Commonly known as: TYLENOL Take 650 mg by mouth 2 (two) times daily.   busPIRone 30 MG tablet Commonly known as: BUSPAR Take 30 mg  by mouth 2 (two) times daily.   cefTRIAXone IVPB Commonly known as: ROCEPHIN Inject 2 g into the vein daily. Indication:  L4-5 discitis/osteomyelitis First Dose: No Last Day of Therapy:  12/24/23 Labs - Once weekly:  CBC/D and BMP, Labs - Once weekly: ESR and CRP Method of administration: IV Push Method of administration may be changed at the discretion of home infusion pharmacist based upon assessment of the patient and/or caregiver's ability to self-administer the medication ordered.   cyanocobalamin 1000 MCG tablet Take 1 tablet (1,000 mcg total) by mouth daily.   daptomycin IVPB Commonly known as: CUBICIN Inject 700 mg into the vein daily. Indication:  L4-5 discitis/osteomyelitis  First Dose: No Last Day of Therapy:  12/24/23 Labs - Once weekly:  CBC/D, BMP, and CPK Labs - Once weekly: ESR and CRP Method of administration: IV Push Method of administration may be changed at the discretion of home infusion pharmacist based upon assessment of the patient and/or caregiver's ability to self-administer the medication ordered.   ferrous sulfate 325 (65 FE) MG tablet Take 1 tablet (325 mg total) by mouth 3 (three) times daily with meals.   gabapentin 400 MG capsule Commonly known as: NEURONTIN Take 1  capsule (400 mg total) by mouth 3 (three) times daily.   hydrOXYzine 50 MG tablet Commonly known as: ATARAX Take 100 mg by mouth 2 (two) times daily.   insulin glargine 100 UNIT/ML Solostar Pen Commonly known as: LANTUS Inject 22 Units into the skin at bedtime. What changed: how much to take   insulin regular 100 units/mL injection Commonly known as: NOVOLIN R Inject 2-14 Units into the skin 3 (three) times daily before meals. Sliding scale: 0-150 = 0 units 151-200= 2 units 201-250= 4 units 251-300= 6 units 301-350= 8 units 351-400= 10 units [phone]= 12 units, and call provider   metFORMIN 500 MG 24 hr tablet Commonly known as: GLUCOPHAGE-XR Take 2 tablets (1,000 mg  total) by mouth daily with breakfast. What changed:  medication strength how much to take   Methocarbamol 1000 MG Tabs Take 1,000 mg by mouth 3 (three) times daily for 7 days.   oxyCODONE 5 MG immediate release tablet Commonly known as: Oxy IR/ROXICODONE Take 1 tablet (5 mg total) by mouth every 4 (four) hours as needed for up to 5 days for severe pain (pain score 7-10) or breakthrough pain.   polyethylene glycol 17 g packet Commonly known as: MIRALAX / GLYCOLAX Take 17 g by mouth 2 (two) times daily.   prazosin 2 MG capsule Commonly known as: MINIPRESS Take 2 mg by mouth at bedtime.   senna-docusate 8.6-50 MG tablet Commonly known as: Senokot-S Take 1 tablet by mouth 2 (two) times daily.   sertraline 100 MG tablet Commonly known as: ZOLOFT Take 100 mg by mouth at bedtime.   topiramate 50 MG tablet Commonly known as: TOPAMAX Take 50 mg by mouth 2 (two) times daily.               Discharge Care Instructions  (From admission, onward)           Start     Ordered   11/19/23 0000  Change dressing on IV access line weekly and PRN  (Home infusion instructions - [facility] )        11/19/23 1603            Follow-up Information     Lorayne Bender, MD Follow up.   Specialty: Family Medicine Contact information: [address] [place] [address] [phone]                No Known Allergies  Consultations: Neurosurgery Infectious disease   Procedures/Studies: [place] EKG SITE RITE Result Date: 11/19/2023 If Site Rite image not attached, placement could not be confirmed due to current cardiac rhythm.  ECHOCARDIOGRAM COMPLETE Result Date: 11/13/2023    ECHOCARDIOGRAM REPORT   Patient Name:   ASENCION GUISINGER Lory Date of Exam: 11/13/2023 Medical Rec #:  [id number]          Height:       64.0 in Accession #:    [id number]         Weight:       212.7 lb Date of Birth:  1985-12-31          BSA:          2.008 m Patient Age:    37 years            BP:           127/85 mmHg Patient Gender: F                  HR:  103 bpm. Exam Location:  Inpatient Procedure: 2D Echo, Color Doppler and Cardiac Doppler (Both Spectral and Color            Flow Doppler were utilized during procedure). Indications:    Bacteremia  History:        Patient has no prior history of Echocardiogram examinations.                 Risk Factors:Diabetes.  Sonographer:    Andrena Bang Referring Phys: [id number] Haydee Lipa IMPRESSIONS  1. Left ventricular ejection fraction, by estimation, is 60 to 65%. The left ventricle has normal function. The left ventricle has no regional wall motion abnormalities. Left ventricular diastolic parameters were normal.  2. Right ventricular systolic function is normal. The right ventricular size is normal.  3. The mitral valve is normal in structure. No evidence of mitral valve regurgitation. No evidence of mitral stenosis.  4. The aortic valve was not well visualized. Aortic valve regurgitation is not visualized. No aortic stenosis is present.  5. The inferior vena cava is normal in size with greater than 50% respiratory variability, suggesting right atrial pressure of 3 mmHg. Comparison(s): No prior Echocardiogram. FINDINGS  Left Ventricle: Left ventricular ejection fraction, by estimation, is 60 to 65%. The left ventricle has normal function. The left ventricle has no regional wall motion abnormalities. Strain was performed and the global longitudinal strain is indeterminate. The left ventricular internal cavity size was normal in size. Suboptimal image quality limits for assessment of left ventricular hypertrophy. Left ventricular diastolic parameters were normal. Right Ventricle: The right ventricular size is normal. No increase in right ventricular wall thickness. Right ventricular systolic function is normal. Left Atrium: Left atrial size was normal in size. Right Atrium: Right atrial size was normal in size. Pericardium: There is no  evidence of pericardial effusion. Mitral Valve: The mitral valve is normal in structure. No evidence of mitral valve regurgitation. No evidence of mitral valve stenosis. Tricuspid Valve: The tricuspid valve is normal in structure. Tricuspid valve regurgitation is not demonstrated. No evidence of tricuspid stenosis. Aortic Valve: The aortic valve was not well visualized. Aortic valve regurgitation is not visualized. No aortic stenosis is present. Aortic valve mean gradient measures 6.0 mmHg. Aortic valve peak gradient measures 15.5 mmHg. Pulmonic Valve: The pulmonic valve was normal in structure. Pulmonic valve regurgitation is not visualized. No evidence of pulmonic stenosis. Aorta: The aortic root, ascending aorta and aortic arch are all structurally normal, with no evidence of dilitation or obstruction. Venous: The inferior vena cava is normal in size with greater than 50% respiratory variability, suggesting right atrial pressure of 3 mmHg. IAS/Shunts: The atrial septum is grossly normal. Additional Comments: 3D was performed not requiring image post processing on an independent workstation and was indeterminate.  LEFT VENTRICLE PLAX 2D LVIDd:         4.30 cm     Diastology LVIDs:         2.90 cm     LV e' medial:  13.50 cm/s LV PW:         1.50 cm     LV e' lateral: 14.00 cm/s LV IVS:        0.80 cm LVOT diam:     2.10 cm LVOT Area:     3.46 cm  LV Volumes (MOD) LV vol d, MOD A2C: 78.7 ml LV vol d, MOD A4C: 88.2 ml LV vol s, MOD A2C: 30.1 ml LV vol s, MOD A4C: 34.7 ml LV  SV MOD A2C:     48.6 ml LV SV MOD A4C:     88.2 ml LV SV MOD BP:      51.4 ml RIGHT VENTRICLE RV S prime:     11.70 cm/s TAPSE (M-mode): 1.6 cm LEFT ATRIUM             Index LA diam:        3.90 cm 1.94 cm/m LA Vol (A2C):   28.5 ml 14.19 ml/m LA Vol (A4C):   36.0 ml 17.92 ml/m LA Biplane Vol: 33.1 ml 16.48 ml/m  AORTIC VALVE AV Vmax:      197.00 cm/s AV Vmean:     115.000 cm/s AV VTI:       0.259 m AV Peak Grad: 15.5 mmHg AV Mean Grad: 6.0  mmHg  AORTA Ao Asc diam: 2.60 cm  SHUNTS Systemic Diam: 2.10 cm Gloriann Larger MD Electronically signed by Gloriann Larger MD Signature Date/Time: 11/13/2023/5:46:25 PM    Final    CT ASPIRATION N/S Result Date: 11/12/2023 INDICATION: Clinical concern for discitis/osteomyelitis. EXAM: CT-guided disc aspiration TECHNIQUE: Multidetector CT imaging of the lumbar spine was performed following the standard protocol without IV contrast. RADIATION DOSE REDUCTION: This exam was performed according to the departmental dose-optimization program which includes automated exposure control, adjustment of the mA and/or kV according to patient size and/or use of iterative reconstruction technique. MEDICATIONS: No preoperative antibiotics. ANESTHESIA/SEDATION: Moderate (conscious) sedation was employed during this procedure. A total of Versed 3 mg, Dilaudid 1 mg, and Fentanyl 150 mcg was administered intravenously by the radiology nurse. Total intra-service moderate Sedation Time: 31 minutes. The patient's level of consciousness and vital signs were monitored continuously by radiology nursing throughout the procedure under my direct supervision. COMPLICATIONS: None immediate. PROCEDURE: Informed written consent was obtained from the patient after a thorough discussion of the procedural risks, benefits and alternatives. All questions were addressed. Maximal Sterile Barrier Technique was utilized including caps, mask, sterile gowns, sterile gloves, sterile drape, hand hygiene and skin antiseptic. A timeout was performed prior to the initiation of the procedure. Using intermittent CT guidance, a 17 gauge introducer needle was advanced into the disc space at L4-L5. Approximately 1 cc of bloody fluid was removed and submitted for laboratory analysis. The needle was removed and a dressing was applied. IMPRESSION: 1. CT-guided disc aspiration of L4-L5 for clinical concern for discitis/osteomyelitis. Electronically Signed   By:  Reagan Camera M.D.   On: 11/12/2023 17:34   MR THORACIC SPINE W WO CONTRAST Result Date: 11/11/2023 CLINICAL DATA:  Mid back pain with infection suspected EXAM: MRI THORACIC AND LUMBAR SPINE WITHOUT AND WITH CONTRAST TECHNIQUE: Multiplanar and multiecho pulse sequences of the thoracic and lumbar spine were obtained without and with intravenous contrast. CONTRAST:  10mL GADAVIST GADOBUTROL 1 MMOL/ML IV SOLN COMPARISON:  None Available. FINDINGS: MRI THORACIC SPINE FINDINGS Alignment:  Physiologic. Vertebrae: No fracture, evidence of discitis, or bone lesion. Cord:  Normal signal and morphology. Paraspinal and other soft tissues: Negative. Disc levels: T6-7: Small central disc protrusion effaces the ventral thecal sac and contacts the spinal cord. T8-9: Small central disc protrusion contacts the ventral spinal cord. The other thoracic disc levels are unremarkable. No abnormal contrast enhancement. MRI LUMBAR SPINE FINDINGS Segmentation:  Standard. Alignment:  Physiologic. Vertebrae: Diffuse bone marrow edema at L4 and L5 with associated contrast enhancement and abnormal signal within the disc space. There is ventral epidural contrast enhancement at both levels that narrows the thecal sac. Conus medullaris: Extends to  the L1 level and appears normal. Paraspinal and other soft tissues: Left hydroureteronephrosis. Bilateral psoas muscle edema and abnormal contrast enhancement without focal fluid collection. Disc levels: L1-L2: Normal disc space and facet joints. No spinal canal stenosis. No neural foraminal stenosis. L2-L3: Normal disc space and facet joints. No spinal canal stenosis. No neural foraminal stenosis. L3-L4: Small disc bulge and mild facet hypertrophy. No spinal canal stenosis. No neural foraminal stenosis. L4-L5: Disc abnormalities as above. Severe spinal canal stenosis. Moderate right and severe left neural foraminal stenosis. L5-S1: Normal disc space and facet joints. No spinal canal stenosis. No neural  foraminal stenosis. Visualized sacrum: Normal. IMPRESSION: 1. L4-L5 discitis-osteomyelitis with ventral epidural phlegmon/abscess resulting in severe spinal canal stenosis and moderate right and severe left neural foraminal stenosis. 2. Bilateral psoas muscle edema and abnormal contrast enhancement without focal fluid collection, consistent with myositis. 3. Left hydroureteronephrosis. 4. Small central disc protrusions at T6-7 and T8-9 contact the ventral spinal cord. Electronically Signed   By: Juanetta Nordmann M.D.   On: 11/11/2023 00:38   MR Lumbar Spine W Wo Contrast Result Date: 11/11/2023 CLINICAL DATA:  Mid back pain with infection suspected EXAM: MRI THORACIC AND LUMBAR SPINE WITHOUT AND WITH CONTRAST TECHNIQUE: Multiplanar and multiecho pulse sequences of the thoracic and lumbar spine were obtained without and with intravenous contrast. CONTRAST:  10mL GADAVIST GADOBUTROL 1 MMOL/ML IV SOLN COMPARISON:  None Available. FINDINGS: MRI THORACIC SPINE FINDINGS Alignment:  Physiologic. Vertebrae: No fracture, evidence of discitis, or bone lesion. Cord:  Normal signal and morphology. Paraspinal and other soft tissues: Negative. Disc levels: T6-7: Small central disc protrusion effaces the ventral thecal sac and contacts the spinal cord. T8-9: Small central disc protrusion contacts the ventral spinal cord. The other thoracic disc levels are unremarkable. No abnormal contrast enhancement. MRI LUMBAR SPINE FINDINGS Segmentation:  Standard. Alignment:  Physiologic. Vertebrae: Diffuse bone marrow edema at L4 and L5 with associated contrast enhancement and abnormal signal within the disc space. There is ventral epidural contrast enhancement at both levels that narrows the thecal sac. Conus medullaris: Extends to the L1 level and appears normal. Paraspinal and other soft tissues: Left hydroureteronephrosis. Bilateral psoas muscle edema and abnormal contrast enhancement without focal fluid collection. Disc levels: L1-L2:  Normal disc space and facet joints. No spinal canal stenosis. No neural foraminal stenosis. L2-L3: Normal disc space and facet joints. No spinal canal stenosis. No neural foraminal stenosis. L3-L4: Small disc bulge and mild facet hypertrophy. No spinal canal stenosis. No neural foraminal stenosis. L4-L5: Disc abnormalities as above. Severe spinal canal stenosis. Moderate right and severe left neural foraminal stenosis. L5-S1: Normal disc space and facet joints. No spinal canal stenosis. No neural foraminal stenosis. Visualized sacrum: Normal. IMPRESSION: 1. L4-L5 discitis-osteomyelitis with ventral epidural phlegmon/abscess resulting in severe spinal canal stenosis and moderate right and severe left neural foraminal stenosis. 2. Bilateral psoas muscle edema and abnormal contrast enhancement without focal fluid collection, consistent with myositis. 3. Left hydroureteronephrosis. 4. Small central disc protrusions at T6-7 and T8-9 contact the ventral spinal cord. Electronically Signed   By: Juanetta Nordmann M.D.   On: 11/11/2023 00:38   CT Angio Chest PE W and/or Wo Contrast Result Date: 11/10/2023 CLINICAL DATA:  Right-sided flank pain shortness of breath positive D-dimer EXAM: CT ANGIOGRAPHY CHEST CT ABDOMEN AND PELVIS WITH CONTRAST TECHNIQUE: Multidetector CT imaging of the chest was performed using the standard protocol during bolus administration of intravenous contrast. Multiplanar CT image reconstructions and MIPs were obtained to evaluate the vascular  anatomy. Multidetector CT imaging of the abdomen and pelvis was performed using the standard protocol during bolus administration of intravenous contrast. RADIATION DOSE REDUCTION: This exam was performed according to the departmental dose-optimization program which includes automated exposure control, adjustment of the mA and/or kV according to patient size and/or use of iterative reconstruction technique. CONTRAST:  75mL OMNIPAQUE IOHEXOL 350 MG/ML SOLN  COMPARISON:  CT 10/20/2021 FINDINGS: CTA CHEST FINDINGS Cardiovascular: Slightly suboptimal opacification of the pulmonary arteries to the segmental level. No evidence of pulmonary embolism. Normal heart size. No pericardial effusion. Nonaneurysmal aorta. Mediastinum/Nodes: Patent trachea. Slightly enlarged thyroid. Scattered thyroid nodules, largest seen in the inferior left lobe measuring 14 mm, no specific imaging follow-up is recommended. No suspicious lymph nodes. Esophagus within normal limits Lungs/Pleura: Minimal atelectasis at the right middle lobe. No acute airspace disease, pleural effusion or pneumothorax Musculoskeletal: No acute osseous abnormality Review of the MIP images confirms the above findings. CT ABDOMEN and PELVIS FINDINGS Hepatobiliary: Liver is enlarged with craniocaudal measurement of 23 cm. Diffuse decreased hepatic density consistent with steatosis. No calcified gallstone or biliary dilatation Pancreas: Unremarkable. No pancreatic ductal dilatation or surrounding inflammatory changes. Spleen: Normal in size without focal abnormality. Adrenals/Urinary Tract: Adrenal glands are normal. Diffuse hypoenhancement of the left kidney. Mild to moderate left hydronephrosis and proximal hydroureter. No obstructing stone. The bladder is unremarkable Stomach/Bowel: The stomach is nonenlarged. No dilated small bowel. No acute bowel wall thickening. Negative appendix Vascular/Lymphatic: Nonaneurysmal aorta.  No suspicious lymph nodes. Reproductive: Uterus unremarkable. Complex left adnexal cyst measuring 4.4 by 3.7 cm. Other: Negative for free air. Musculoskeletal: Heterogenous left greater than right psoas muscle enlargement which appears slightly hyperdense. On the left, this extends to the left iliacus muscle where there is also stranding. No active extravasation. Abnormal prevertebral soft tissue thickening extending from about L3-L4 to the L5-S1 level. There is endplate destructive change at L4-L5  and heterogeneous sclerosis at the L4, L5 and S1 vertebral bodies which is new compared to the prior CT. Review of the MIP images confirms the above findings. IMPRESSION: 1. Negative for acute pulmonary embolus. 2. Diffuse hypoenhancement of the left kidney with mild to moderate left hydronephrosis and proximal hydroureter. No obstructing stone. Source of obstruction appears to be heterogenous soft tissue thickening in the left paraspinal region subsequently discussed. 3. Heterogenous left greater than right psoas muscle enlargement which appears slightly hyperdense. On the left, this extends to the left iliacus muscle where there is also stranding. The overall appearance is suspect for retroperitoneal hematoma. There is no active extravasation on this exam. 4. Abnormal prevertebral soft tissue thickening extending from about L3-L4 to the L5-S1 level with endplate destructive change at L4-L5 and heterogeneous sclerosis at the L4, L5 and S1 vertebral bodies. Findings are highly suspicious for discitis/osteomyelitis. MRI of the lumbar spine with and without contrast is recommended for further evaluation. 5. Hepatomegaly with hepatic steatosis. 6. Complex left adnexal cyst measuring up to 4.4 cm. Correlation with pelvic ultrasound is suggested. Electronically Signed   By: Esmeralda Hedge M.D.   On: 11/10/2023 22:56   CT ABDOMEN PELVIS W CONTRAST Result Date: 11/10/2023 CLINICAL DATA:  Right-sided flank pain shortness of breath positive D-dimer EXAM: CT ANGIOGRAPHY CHEST CT ABDOMEN AND PELVIS WITH CONTRAST TECHNIQUE: Multidetector CT imaging of the chest was performed using the standard protocol during bolus administration of intravenous contrast. Multiplanar CT image reconstructions and MIPs were obtained to evaluate the vascular anatomy. Multidetector CT imaging of the abdomen and pelvis was performed using  the standard protocol during bolus administration of intravenous contrast. RADIATION DOSE REDUCTION: This exam  was performed according to the departmental dose-optimization program which includes automated exposure control, adjustment of the mA and/or kV according to patient size and/or use of iterative reconstruction technique. CONTRAST:  75mL OMNIPAQUE IOHEXOL 350 MG/ML SOLN COMPARISON:  CT 10/20/2021 FINDINGS: CTA CHEST FINDINGS Cardiovascular: Slightly suboptimal opacification of the pulmonary arteries to the segmental level. No evidence of pulmonary embolism. Normal heart size. No pericardial effusion. Nonaneurysmal aorta. Mediastinum/Nodes: Patent trachea. Slightly enlarged thyroid. Scattered thyroid nodules, largest seen in the inferior left lobe measuring 14 mm, no specific imaging follow-up is recommended. No suspicious lymph nodes. Esophagus within normal limits Lungs/Pleura: Minimal atelectasis at the right middle lobe. No acute airspace disease, pleural effusion or pneumothorax Musculoskeletal: No acute osseous abnormality Review of the MIP images confirms the above findings. CT ABDOMEN and PELVIS FINDINGS Hepatobiliary: Liver is enlarged with craniocaudal measurement of 23 cm. Diffuse decreased hepatic density consistent with steatosis. No calcified gallstone or biliary dilatation Pancreas: Unremarkable. No pancreatic ductal dilatation or surrounding inflammatory changes. Spleen: Normal in size without focal abnormality. Adrenals/Urinary Tract: Adrenal glands are normal. Diffuse hypoenhancement of the left kidney. Mild to moderate left hydronephrosis and proximal hydroureter. No obstructing stone. The bladder is unremarkable Stomach/Bowel: The stomach is nonenlarged. No dilated small bowel. No acute bowel wall thickening. Negative appendix Vascular/Lymphatic: Nonaneurysmal aorta.  No suspicious lymph nodes. Reproductive: Uterus unremarkable. Complex left adnexal cyst measuring 4.4 by 3.7 cm. Other: Negative for free air. Musculoskeletal: Heterogenous left greater than right psoas muscle enlargement which appears  slightly hyperdense. On the left, this extends to the left iliacus muscle where there is also stranding. No active extravasation. Abnormal prevertebral soft tissue thickening extending from about L3-L4 to the L5-S1 level. There is endplate destructive change at L4-L5 and heterogeneous sclerosis at the L4, L5 and S1 vertebral bodies which is new compared to the prior CT. Review of the MIP images confirms the above findings. IMPRESSION: 1. Negative for acute pulmonary embolus. 2. Diffuse hypoenhancement of the left kidney with mild to moderate left hydronephrosis and proximal hydroureter. No obstructing stone. Source of obstruction appears to be heterogenous soft tissue thickening in the left paraspinal region subsequently discussed. 3. Heterogenous left greater than right psoas muscle enlargement which appears slightly hyperdense. On the left, this extends to the left iliacus muscle where there is also stranding. The overall appearance is suspect for retroperitoneal hematoma. There is no active extravasation on this exam. 4. Abnormal prevertebral soft tissue thickening extending from about L3-L4 to the L5-S1 level with endplate destructive change at L4-L5 and heterogeneous sclerosis at the L4, L5 and S1 vertebral bodies. Findings are highly suspicious for discitis/osteomyelitis. MRI of the lumbar spine with and without contrast is recommended for further evaluation. 5. Hepatomegaly with hepatic steatosis. 6. Complex left adnexal cyst measuring up to 4.4 cm. Correlation with pelvic ultrasound is suggested. Electronically Signed   By: Esmeralda Hedge M.D.   On: 11/10/2023 22:56   DG Chest 2 View Result Date: 11/10/2023 CLINICAL DATA:  Shortness of breath for 2 weeks. EXAM: CHEST - 2 VIEW COMPARISON:  10/20/2021 FINDINGS: The cardiomediastinal contours are normal. The lungs are clear. Pulmonary vasculature is normal. No consolidation, pleural effusion, or pneumothorax. No acute osseous abnormalities are seen.  IMPRESSION: Negative radiographs of the chest. Electronically Signed   By: Chadwick Colonel M.D.   On: 11/10/2023 17:24   (Echo, Carotid, EGD, Colonoscopy, ERCP)    Subjective: Patient has  some back discomfort otherwise denies any complaints.   Discharge Exam: Vitals:   11/21/23 0459 11/21/23 0900  BP: 106/61 113/71  Pulse: 87 93  Resp: 17 17  Temp: 97.7 F (36.5 C) 97.8 F (36.6 C)  SpO2: 100% 95%   Vitals:   11/20/23 2005 11/21/23 0459 11/21/23 0623 11/21/23 0900  BP: (!) 123/97 106/61  113/71  Pulse: 99 87  93  Resp: 17 17  17   Temp: 98.6 F (37 C) 97.7 F (36.5 C)  97.8 F (36.6 C)  TempSrc: Oral   Oral  SpO2: 99% 100%  95%  Weight:   102.9 kg   Height:        General: Pt is alert, awake, not in acute distress Cardiovascular: RRR, S1/S2 +, no rubs, no gallops Respiratory: CTA bilaterally, no wheezing, no rhonchi Abdominal: Soft, NT, ND, bowel sounds + Extremities: no edema, no cyanosis    The results of significant diagnostics from this hospitalization (including imaging, microbiology, ancillary and laboratory) are listed below for reference.     Microbiology: Recent Results (from the past 240 hours)  Aerobic/Anaerobic Culture w Gram Stain (surgical/deep wound)     Status: None   Collection Time: 11/12/23  4:43 PM   Specimen: Abscess  Result Value Ref Range Status   Specimen Description ABSCESS  Final   Special Requests INTERVERTEBRAL DISC  Final   Gram Stain NO WBC SEEN NO ORGANISMS SEEN   Final   Culture   Final    No growth aerobically or anaerobically. Performed at [facility], [address] [place], [place] [address]    Report Status 11/17/2023 FINAL  Final  Fungus Culture With Stain     Status: None (Preliminary result)   Collection Time: 11/12/23  4:43 PM  Result Value Ref Range Status   Fungus Stain Final report  Final    Comment: (NOTE) Performed At: [facility] [address] [place], [place] [id number] Jolene Schimke  MD [id number]    Fungus (Mycology) Culture PENDING  Incomplete   Fungal Source ABSCESS  Final    Comment: INTERVERTEBRAL Performed at [facility], [address] [place], [place] [address]   Fungus Culture Result     Status: None   Collection Time: 11/12/23  4:43 PM  Result Value Ref Range Status   Result 1 Comment  Final    Comment: (NOTE) KOH/Calcofluor preparation:  no fungus observed. Performed At: [facility] [address] [place], [place] [id number] Jolene Schimke MD [id number]      Labs: BNP (last 3 results) No results for input(s): "BNP" in the last 8760 hours. Basic Metabolic Panel: Recent Labs  Lab 11/16/23 0426 11/18/23 0652 11/20/23 0509  NA 132* 133* 134*  K 3.5 4.1 3.5  CL 96* 96* 98  CO2 24 26 25   GLUCOSE 238* 171* 205*  BUN 13 14 13   CREATININE 0.79 0.76 0.92  CALCIUM 8.6* 8.5* 8.6*   Liver Function Tests: No results for input(s): "AST", "ALT", "ALKPHOS", "BILITOT", "PROT", "ALBUMIN" in the last 168 hours. No results for input(s): "LIPASE", "AMYLASE" in the last 168 hours. No results for input(s): "AMMONIA" in the last 168 hours. CBC: Recent Labs  Lab 11/16/23 0426 11/18/23 0652 11/20/23 0509  WBC 6.9 6.8 6.9  HGB 7.2* 7.0* 6.7*  HCT 24.7* 24.4* 23.5*  MCV 74.8* 74.8* 76.5*  PLT 424* 429* 397   Cardiac Enzymes: No results for input(s): "CKTOTAL", "CKMB", "CKMBINDEX", "TROPONINI" in the last 168 hours.  BNP: Invalid input(s): "POCBNP" CBG: Recent Labs  Lab 11/20/23 1132 11/20/23 1624 11/20/23 2218 11/21/23 0625 11/21/23 1143  GLUCAP 240* 143* 205* 202* 179*   D-Dimer No results for input(s): "DDIMER" in the last 72 hours. Hgb A1c No results for input(s): "HGBA1C" in the last 72 hours. Lipid Profile No results for input(s): "CHOL", "HDL", "LDLCALC", "TRIG", "CHOLHDL", "LDLDIRECT" in the last 72 hours. Thyroid function studies No results for input(s): "TSH", "T4TOTAL", "T3FREE", "THYROIDAB" in the last 72  hours.  Invalid input(s): "FREET3" Anemia work up No results for input(s): "VITAMINB12", "FOLATE", "FERRITIN", "TIBC", "IRON", "RETICCTPCT" in the last 72 hours. Urinalysis    Component Value Date/Time   COLORURINE STRAW (A) 11/10/2023 2302   APPEARANCEUR CLEAR 11/10/2023 2302   LABSPEC >1.046 (H) 11/10/2023 2302   PHURINE 6.0 11/10/2023 2302   GLUCOSEU NEGATIVE 11/10/2023 2302   HGBUR NEGATIVE 11/10/2023 2302   HGBUR negative 11/15/2009 0944   BILIRUBINUR NEGATIVE 11/10/2023 2302   BILIRUBINUR NEG 12/04/2014 1047   KETONESUR NEGATIVE 11/10/2023 2302   PROTEINUR NEGATIVE 11/10/2023 2302   UROBILINOGEN 1.0 07/27/2020 1534   NITRITE NEGATIVE 11/10/2023 2302   LEUKOCYTESUR NEGATIVE 11/10/2023 2302   Sepsis Labs Recent Labs  Lab 11/16/23 0426 11/18/23 0652 11/20/23 0509  WBC 6.9 6.8 6.9   Microbiology Recent Results (from the past 240 hours)  Aerobic/Anaerobic Culture w Gram Stain (surgical/deep wound)     Status: None   Collection Time: 11/12/23  4:43 PM   Specimen: Abscess  Result Value Ref Range Status   Specimen Description ABSCESS  Final   Special Requests INTERVERTEBRAL DISC  Final   Gram Stain NO WBC SEEN NO ORGANISMS SEEN   Final   Culture   Final    No growth aerobically or anaerobically. Performed at [facility], [address] [place], [place] [address]    Report Status 11/17/2023 FINAL  Final  Fungus Culture With Stain     Status: None (Preliminary result)   Collection Time: 11/12/23  4:43 PM  Result Value Ref Range Status   Fungus Stain Final report  Final    Comment: (NOTE) Performed At: [facility] [address] [place], [place] [id number] Pearlean Botts MD [id number]    Fungus (Mycology) Culture PENDING  Incomplete   Fungal Source ABSCESS  Final    Comment: INTERVERTEBRAL Performed at [facility], [address] [place], [place] [address]   Fungus Culture Result     Status: None   Collection Time: 11/12/23  4:43 PM   Result Value Ref Range Status   Result 1 Comment  Final    Comment: (NOTE) KOH/Calcofluor preparation:  no fungus observed. Performed At: [facility] [address] [place], [place] [id number] Pearlean Botts MD [id number]      Time coordinating discharge: 35 minutes  SIGNED:   Vada Garibaldi, MD  [facility] 11/21/2023, 12:44 PM

## 2023-11-21 NOTE — Progress Notes (Signed)
 PT Cancellation Note  Patient Details Name: Krystal [name] MRN: [id number] DOB: May 15, 1986   Cancelled Treatment:    Reason Eval/Treat Not Completed: (P) Other (comment) (Pt eating will f/u per POC.)   Talah Cookston J Shamiya Hoogland 11/21/2023, 1:03 PM  Beulah Brunt , PTA Acute Rehabilitation Services Office [phone]

## 2023-11-21 NOTE — TOC Progression Note (Addendum)
 Transition of Care [facility]) - Progression Note    Patient Details  Name: Krystal [name] MRN: [id number] Date of Birth: 17-Aug-1985  Transition of [facility]) CM/SW Contact  Elspeth Hals, LCSW Phone Number: 11/21/2023, 10:00 AM  Clinical Narrative:   CSW spoke with Peterson Brandt and [employer]: they can get the "safe keeper" order today.  Need [facility] MD to speak with [facility] again and get acceptance for today.  Dr Hilton Lucky informed.    1200: Per Dr Hilton Lucky, [facility] NP is accepting at [facility]. Officer [employer] informed and will move forward with court order and setting up transport for this afternoon.   1345: Advice worker.  All set, transport is on the way.  CSW spoke with Dr Reford Canterbury at [facility], he confirmed that with court order in place, they are all set to receive pt.  CSW inquired about RN report number, per Dr Reford Canterbury, they already have report.  No RN report needed.   Expected Discharge Plan: Corrections Facility Barriers to Discharge: Continued Medical Work up  Expected Discharge Plan and Services   Discharge Planning Services: CM Consult     Expected Discharge Date: 11/20/23                                     Social Determinants of Health (SDOH) Interventions SDOH Screenings   Food Insecurity: No Food Insecurity (11/11/2023)  Housing: Low Risk  (11/11/2023)  Transportation Needs: No Transportation Needs (11/11/2023)  Utilities: Not At Risk (11/11/2023)  Alcohol Screen: Low Risk  (05/17/2020)  Depression (PHQ2-9): Medium Risk (10/19/2021)  Tobacco Use: High Risk (11/11/2023)    Readmission Risk Interventions     No data to display

## 2023-11-21 NOTE — TOC Transition Note (Signed)
 Transition of Care [facility]) - Discharge Note   Patient Details  Name: Krystal [name] MRN: [id number] Date of Birth: 1986-07-24  Transition of Care [facility]) CM/SW Contact:  Elspeth Hals, LCSW Phone Number: 11/21/2023, 1:51 PM   Clinical Narrative:   Pt transferring to [facility] in [place].  Pt will be transported by [facility].  No RN report needed.      Final next level of care: Corrections Facility Barriers to Discharge: Barriers Resolved   Patient Goals and CMS Choice            Discharge Placement                Patient to be transferred to facility by: [facility] Name of family member notified: mother Bridgette Campus Patient and family notified of of transfer: 11/21/23  Discharge Plan and Services Additional resources added to the After Visit Summary for     Discharge Planning Services: CM Consult                                 Social Drivers of Health (SDOH) Interventions SDOH Screenings   Food Insecurity: No Food Insecurity (11/11/2023)  Housing: Low Risk  (11/11/2023)  Transportation Needs: No Transportation Needs (11/11/2023)  Utilities: Not At Risk (11/11/2023)  Alcohol Screen: Low Risk  (05/17/2020)  Depression (PHQ2-9): Medium Risk (10/19/2021)  Tobacco Use: High Risk (11/11/2023)     Readmission Risk Interventions     No data to display

## 2023-12-13 LAB — FUNGUS CULTURE RESULT

## 2023-12-13 LAB — FUNGAL ORGANISM REFLEX

## 2023-12-13 LAB — FUNGUS CULTURE WITH STAIN

## 2023-12-17 DIAGNOSIS — Z419 Encounter for procedure for purposes other than remedying health state, unspecified: Secondary | ICD-10-CM | POA: Diagnosis not present

## 2024-01-17 DIAGNOSIS — Z419 Encounter for procedure for purposes other than remedying health state, unspecified: Secondary | ICD-10-CM | POA: Diagnosis not present

## 2024-02-16 DIAGNOSIS — Z419 Encounter for procedure for purposes other than remedying health state, unspecified: Secondary | ICD-10-CM | POA: Diagnosis not present

## 2024-03-18 DIAGNOSIS — Z419 Encounter for procedure for purposes other than remedying health state, unspecified: Secondary | ICD-10-CM | POA: Diagnosis not present

## 2024-04-18 DIAGNOSIS — Z419 Encounter for procedure for purposes other than remedying health state, unspecified: Secondary | ICD-10-CM | POA: Diagnosis not present

## 2024-06-18 DIAGNOSIS — Z419 Encounter for procedure for purposes other than remedying health state, unspecified: Secondary | ICD-10-CM | POA: Diagnosis not present
